# Patient Record
Sex: Female | Born: 1940
Health system: Southern US, Community
[De-identification: ages and names within clinical notes are randomized; demographics above are authoritative.]

## PROBLEM LIST (undated history)

## (undated) DIAGNOSIS — C801 Malignant (primary) neoplasm, unspecified: Secondary | ICD-10-CM

## (undated) DIAGNOSIS — Z923 Personal history of irradiation: Secondary | ICD-10-CM

## (undated) DIAGNOSIS — Z9889 Other specified postprocedural states: Secondary | ICD-10-CM

## (undated) DIAGNOSIS — I2510 Atherosclerotic heart disease of native coronary artery without angina pectoris: Secondary | ICD-10-CM

## (undated) DIAGNOSIS — R112 Nausea with vomiting, unspecified: Secondary | ICD-10-CM

## (undated) DIAGNOSIS — I499 Cardiac arrhythmia, unspecified: Secondary | ICD-10-CM

## (undated) DIAGNOSIS — M8580 Other specified disorders of bone density and structure, unspecified site: Secondary | ICD-10-CM

## (undated) DIAGNOSIS — R0602 Shortness of breath: Secondary | ICD-10-CM

## (undated) DIAGNOSIS — M1990 Unspecified osteoarthritis, unspecified site: Secondary | ICD-10-CM

## (undated) DIAGNOSIS — R32 Unspecified urinary incontinence: Secondary | ICD-10-CM

## (undated) DIAGNOSIS — K219 Gastro-esophageal reflux disease without esophagitis: Secondary | ICD-10-CM

## (undated) DIAGNOSIS — Z9221 Personal history of antineoplastic chemotherapy: Secondary | ICD-10-CM

## (undated) DIAGNOSIS — A64 Unspecified sexually transmitted disease: Secondary | ICD-10-CM

## (undated) DIAGNOSIS — I10 Essential (primary) hypertension: Secondary | ICD-10-CM

## (undated) DIAGNOSIS — I4891 Unspecified atrial fibrillation: Secondary | ICD-10-CM

## (undated) HISTORY — PX: TONSILLECTOMY: SUR1361

## (undated) HISTORY — DX: Unspecified urinary incontinence: R32

## (undated) HISTORY — DX: Other specified disorders of bone density and structure, unspecified site: M85.80

## (undated) HISTORY — PX: POLYPECTOMY: SHX149

## (undated) HISTORY — DX: Atherosclerotic heart disease of native coronary artery without angina pectoris: I25.10

## (undated) HISTORY — PX: COLONOSCOPY: SHX174

## (undated) HISTORY — DX: Unspecified atrial fibrillation: I48.91

## (undated) HISTORY — DX: Malignant (primary) neoplasm, unspecified: C80.1

## (undated) HISTORY — PX: BREAST EXCISIONAL BIOPSY: SUR124

## (undated) HISTORY — DX: Essential (primary) hypertension: I10

## (undated) HISTORY — DX: Unspecified sexually transmitted disease: A64

---

## 1996-04-11 DIAGNOSIS — Z9221 Personal history of antineoplastic chemotherapy: Secondary | ICD-10-CM

## 1996-04-11 DIAGNOSIS — Z923 Personal history of irradiation: Secondary | ICD-10-CM

## 1996-04-11 DIAGNOSIS — C801 Malignant (primary) neoplasm, unspecified: Secondary | ICD-10-CM

## 1996-04-11 HISTORY — DX: Personal history of antineoplastic chemotherapy: Z92.21

## 1996-04-11 HISTORY — DX: Malignant (primary) neoplasm, unspecified: C80.1

## 1996-04-11 HISTORY — DX: Personal history of irradiation: Z92.3

## 1996-04-11 HISTORY — PX: BREAST LUMPECTOMY: SHX2

## 1996-04-11 HISTORY — PX: BREAST SURGERY: SHX581

## 2001-04-11 HISTORY — PX: OTHER SURGICAL HISTORY: SHX169

## 2002-04-11 HISTORY — PX: CORONARY ARTERY BYPASS GRAFT: SHX141

## 2002-05-22 ENCOUNTER — Ambulatory Visit (HOSPITAL_COMMUNITY): Admission: RE | Admit: 2002-05-22 | Discharge: 2002-05-22 | Payer: Self-pay

## 2002-06-25 ENCOUNTER — Encounter (HOSPITAL_COMMUNITY): Admission: RE | Admit: 2002-06-25 | Discharge: 2002-09-23 | Payer: Self-pay | Admitting: Cardiology

## 2002-10-10 ENCOUNTER — Encounter (HOSPITAL_COMMUNITY): Admission: RE | Admit: 2002-10-10 | Discharge: 2003-01-08 | Payer: Self-pay | Admitting: Cardiology

## 2003-01-10 ENCOUNTER — Encounter (HOSPITAL_COMMUNITY): Admission: RE | Admit: 2003-01-10 | Discharge: 2003-04-10 | Payer: Self-pay | Admitting: Cardiology

## 2003-04-12 ENCOUNTER — Encounter (HOSPITAL_COMMUNITY): Admission: RE | Admit: 2003-04-12 | Discharge: 2003-07-11 | Payer: Self-pay | Admitting: Cardiology

## 2003-06-10 ENCOUNTER — Encounter: Admission: RE | Admit: 2003-06-10 | Discharge: 2003-06-10 | Payer: Self-pay | Admitting: General Surgery

## 2003-07-12 ENCOUNTER — Encounter (HOSPITAL_COMMUNITY): Admission: RE | Admit: 2003-07-12 | Discharge: 2003-09-10 | Payer: Self-pay | Admitting: Cardiology

## 2004-04-11 HISTORY — PX: DILATION AND CURETTAGE OF UTERUS: SHX78

## 2004-06-10 ENCOUNTER — Encounter: Admission: RE | Admit: 2004-06-10 | Discharge: 2004-06-10 | Payer: Self-pay | Admitting: Oncology

## 2004-06-28 ENCOUNTER — Other Ambulatory Visit: Admission: RE | Admit: 2004-06-28 | Discharge: 2004-06-28 | Payer: Self-pay | Admitting: Obstetrics and Gynecology

## 2004-06-29 ENCOUNTER — Ambulatory Visit: Payer: Self-pay | Admitting: Oncology

## 2004-07-29 ENCOUNTER — Encounter (INDEPENDENT_AMBULATORY_CARE_PROVIDER_SITE_OTHER): Payer: Self-pay | Admitting: *Deleted

## 2004-07-29 ENCOUNTER — Ambulatory Visit (HOSPITAL_COMMUNITY): Admission: RE | Admit: 2004-07-29 | Discharge: 2004-07-29 | Payer: Self-pay | Admitting: Obstetrics and Gynecology

## 2004-08-24 ENCOUNTER — Encounter: Admission: RE | Admit: 2004-08-24 | Discharge: 2004-08-24 | Payer: Self-pay | Admitting: Oncology

## 2004-10-25 ENCOUNTER — Other Ambulatory Visit: Admission: RE | Admit: 2004-10-25 | Discharge: 2004-10-25 | Payer: Self-pay | Admitting: Obstetrics and Gynecology

## 2005-04-21 ENCOUNTER — Other Ambulatory Visit: Admission: RE | Admit: 2005-04-21 | Discharge: 2005-04-21 | Payer: Self-pay | Admitting: Obstetrics and Gynecology

## 2005-04-25 ENCOUNTER — Ambulatory Visit: Payer: Self-pay | Admitting: Internal Medicine

## 2005-05-03 ENCOUNTER — Ambulatory Visit: Payer: Self-pay | Admitting: Internal Medicine

## 2005-05-09 ENCOUNTER — Ambulatory Visit: Payer: Self-pay | Admitting: Internal Medicine

## 2005-05-09 ENCOUNTER — Encounter (INDEPENDENT_AMBULATORY_CARE_PROVIDER_SITE_OTHER): Payer: Self-pay | Admitting: Specialist

## 2005-05-09 DIAGNOSIS — D126 Benign neoplasm of colon, unspecified: Secondary | ICD-10-CM | POA: Insufficient documentation

## 2005-05-19 ENCOUNTER — Ambulatory Visit (HOSPITAL_COMMUNITY): Admission: RE | Admit: 2005-05-19 | Discharge: 2005-05-19 | Payer: Self-pay | Admitting: Internal Medicine

## 2005-05-19 ENCOUNTER — Ambulatory Visit: Payer: Self-pay | Admitting: Internal Medicine

## 2005-05-19 DIAGNOSIS — K219 Gastro-esophageal reflux disease without esophagitis: Secondary | ICD-10-CM | POA: Insufficient documentation

## 2005-05-19 DIAGNOSIS — K2900 Acute gastritis without bleeding: Secondary | ICD-10-CM | POA: Insufficient documentation

## 2005-05-19 DIAGNOSIS — K222 Esophageal obstruction: Secondary | ICD-10-CM

## 2005-06-21 ENCOUNTER — Ambulatory Visit: Payer: Self-pay | Admitting: Oncology

## 2005-07-12 ENCOUNTER — Ambulatory Visit: Payer: Self-pay | Admitting: Internal Medicine

## 2005-07-12 ENCOUNTER — Encounter: Admission: RE | Admit: 2005-07-12 | Discharge: 2005-07-12 | Payer: Self-pay | Admitting: Oncology

## 2006-06-19 ENCOUNTER — Ambulatory Visit: Payer: Self-pay | Admitting: Oncology

## 2006-07-17 ENCOUNTER — Encounter: Admission: RE | Admit: 2006-07-17 | Discharge: 2006-07-17 | Payer: Self-pay | Admitting: Oncology

## 2006-07-18 ENCOUNTER — Encounter: Admission: RE | Admit: 2006-07-18 | Discharge: 2006-07-18 | Payer: Self-pay | Admitting: Oncology

## 2006-08-21 ENCOUNTER — Ambulatory Visit: Payer: Self-pay | Admitting: Oncology

## 2006-10-23 ENCOUNTER — Ambulatory Visit: Payer: Self-pay | Admitting: Internal Medicine

## 2007-07-18 ENCOUNTER — Encounter: Admission: RE | Admit: 2007-07-18 | Discharge: 2007-07-18 | Payer: Self-pay | Admitting: Oncology

## 2007-07-25 ENCOUNTER — Ambulatory Visit: Payer: Self-pay | Admitting: Oncology

## 2007-07-30 LAB — CBC WITH DIFFERENTIAL/PLATELET
BASO%: 0.2 % (ref 0.0–2.0)
Basophils Absolute: 0 10*3/uL (ref 0.0–0.1)
MCH: 32.2 pg (ref 26.0–34.0)
MCV: 91.6 fL (ref 81.0–101.0)
MONO#: 0.3 10*3/uL (ref 0.1–0.9)
MONO%: 4.9 % (ref 0.0–13.0)
NEUT%: 64.4 % (ref 39.6–76.8)
Platelets: 194 10*3/uL (ref 145–400)
RBC: 4.25 10*6/uL (ref 3.70–5.32)
RDW: 13.3 % (ref 11.3–14.5)
WBC: 5.2 10*3/uL (ref 3.9–10.0)
lymph#: 1.5 10*3/uL (ref 0.9–3.3)

## 2007-07-30 LAB — COMPREHENSIVE METABOLIC PANEL
ALT: 28 U/L (ref 0–35)
Albumin: 4 g/dL (ref 3.5–5.2)
Calcium: 9.3 mg/dL (ref 8.4–10.5)
Chloride: 104 mEq/L (ref 96–112)
Potassium: 3.7 mEq/L (ref 3.5–5.3)
Sodium: 140 mEq/L (ref 135–145)
Total Protein: 6.7 g/dL (ref 6.0–8.3)

## 2007-08-01 ENCOUNTER — Encounter: Payer: Self-pay | Admitting: Internal Medicine

## 2007-08-01 DIAGNOSIS — Z8719 Personal history of other diseases of the digestive system: Secondary | ICD-10-CM

## 2008-01-18 ENCOUNTER — Telehealth: Payer: Self-pay | Admitting: Internal Medicine

## 2008-03-25 ENCOUNTER — Ambulatory Visit: Payer: Self-pay | Admitting: Vascular Surgery

## 2008-07-21 ENCOUNTER — Ambulatory Visit: Payer: Self-pay | Admitting: Oncology

## 2008-07-22 ENCOUNTER — Encounter: Admission: RE | Admit: 2008-07-22 | Discharge: 2008-07-22 | Payer: Self-pay | Admitting: Oncology

## 2008-07-23 LAB — COMPREHENSIVE METABOLIC PANEL
AST: 21 U/L (ref 0–37)
Albumin: 4.1 g/dL (ref 3.5–5.2)
BUN: 24 mg/dL — ABNORMAL HIGH (ref 6–23)
CO2: 27 mEq/L (ref 19–32)
Creatinine, Ser: 1.11 mg/dL (ref 0.40–1.20)
Total Bilirubin: 0.5 mg/dL (ref 0.3–1.2)
Total Protein: 6.7 g/dL (ref 6.0–8.3)

## 2008-07-23 LAB — CBC WITH DIFFERENTIAL/PLATELET
Eosinophils Absolute: 0.1 10*3/uL (ref 0.0–0.5)
HGB: 14.1 g/dL (ref 11.6–15.9)
MCH: 32 pg (ref 25.1–34.0)
MCHC: 34.2 g/dL (ref 31.5–36.0)
MCV: 93.7 fL (ref 79.5–101.0)
MONO%: 10.6 % (ref 0.0–14.0)
Platelets: 194 10*3/uL (ref 145–400)
RBC: 4.42 10*6/uL (ref 3.70–5.45)
WBC: 4.9 10*3/uL (ref 3.9–10.3)

## 2008-07-30 ENCOUNTER — Encounter: Payer: Self-pay | Admitting: Internal Medicine

## 2008-08-22 ENCOUNTER — Ambulatory Visit (HOSPITAL_COMMUNITY): Admission: RE | Admit: 2008-08-22 | Discharge: 2008-08-22 | Payer: Self-pay | Admitting: Oncology

## 2008-12-04 ENCOUNTER — Encounter: Admission: RE | Admit: 2008-12-04 | Discharge: 2008-12-04 | Payer: Self-pay | Admitting: Orthopedic Surgery

## 2008-12-12 ENCOUNTER — Encounter: Payer: Self-pay | Admitting: Internal Medicine

## 2008-12-12 ENCOUNTER — Telehealth: Payer: Self-pay | Admitting: Internal Medicine

## 2008-12-31 ENCOUNTER — Encounter: Payer: Self-pay | Admitting: Internal Medicine

## 2009-01-07 ENCOUNTER — Ambulatory Visit: Payer: Self-pay | Admitting: Internal Medicine

## 2009-01-07 DIAGNOSIS — Z860100 Personal history of colon polyps, unspecified: Secondary | ICD-10-CM | POA: Insufficient documentation

## 2009-01-07 DIAGNOSIS — K625 Hemorrhage of anus and rectum: Secondary | ICD-10-CM

## 2009-01-08 ENCOUNTER — Telehealth: Payer: Self-pay | Admitting: Internal Medicine

## 2009-02-16 ENCOUNTER — Ambulatory Visit: Payer: Self-pay | Admitting: Internal Medicine

## 2009-07-24 ENCOUNTER — Encounter: Admission: RE | Admit: 2009-07-24 | Discharge: 2009-07-24 | Payer: Self-pay | Admitting: Oncology

## 2009-08-13 ENCOUNTER — Ambulatory Visit: Payer: Self-pay | Admitting: Oncology

## 2009-08-14 LAB — CBC WITH DIFFERENTIAL/PLATELET
BASO%: 0.4 % (ref 0.0–2.0)
Basophils Absolute: 0 10*3/uL (ref 0.0–0.1)
LYMPH%: 34.8 % (ref 14.0–49.7)
MONO#: 0.5 10*3/uL (ref 0.1–0.9)
MONO%: 10 % (ref 0.0–14.0)
NEUT#: 2.6 10*3/uL (ref 1.5–6.5)
NEUT%: 52.5 % (ref 38.4–76.8)
RBC: 4.22 10*6/uL (ref 3.70–5.45)
RDW: 13.5 % (ref 11.2–14.5)
WBC: 4.9 10*3/uL (ref 3.9–10.3)
lymph#: 1.7 10*3/uL (ref 0.9–3.3)

## 2009-08-14 LAB — COMPREHENSIVE METABOLIC PANEL
ALT: 27 U/L (ref 0–35)
CO2: 26 mEq/L (ref 19–32)
Calcium: 9.1 mg/dL (ref 8.4–10.5)
Chloride: 105 mEq/L (ref 96–112)
Creatinine, Ser: 1.29 mg/dL — ABNORMAL HIGH (ref 0.40–1.20)
Glucose, Bld: 102 mg/dL — ABNORMAL HIGH (ref 70–99)
Sodium: 140 mEq/L (ref 135–145)
Total Bilirubin: 0.6 mg/dL (ref 0.3–1.2)

## 2010-05-01 ENCOUNTER — Other Ambulatory Visit: Payer: Self-pay | Admitting: Oncology

## 2010-05-01 DIAGNOSIS — Z1231 Encounter for screening mammogram for malignant neoplasm of breast: Secondary | ICD-10-CM

## 2010-07-26 ENCOUNTER — Ambulatory Visit
Admission: RE | Admit: 2010-07-26 | Discharge: 2010-07-26 | Disposition: A | Discharge status: Home / Self Care | Payer: Medicare Other | Source: Ambulatory Visit | Attending: Oncology | Admitting: Oncology

## 2010-07-26 DIAGNOSIS — Z1231 Encounter for screening mammogram for malignant neoplasm of breast: Secondary | ICD-10-CM

## 2010-08-10 ENCOUNTER — Encounter (HOSPITAL_BASED_OUTPATIENT_CLINIC_OR_DEPARTMENT_OTHER): Payer: Medicare Other | Admitting: Oncology

## 2010-08-10 ENCOUNTER — Other Ambulatory Visit: Payer: Self-pay | Admitting: Oncology

## 2010-08-10 DIAGNOSIS — Z170 Estrogen receptor positive status [ER+]: Secondary | ICD-10-CM

## 2010-08-10 DIAGNOSIS — C50419 Malignant neoplasm of upper-outer quadrant of unspecified female breast: Secondary | ICD-10-CM

## 2010-08-10 DIAGNOSIS — C50919 Malignant neoplasm of unspecified site of unspecified female breast: Secondary | ICD-10-CM

## 2010-08-10 LAB — CBC WITH DIFFERENTIAL/PLATELET
EOS%: 2.5 % (ref 0.0–7.0)
Eosinophils Absolute: 0.1 10*3/uL (ref 0.0–0.5)
LYMPH%: 34.7 % (ref 14.0–49.7)
MCH: 32.2 pg (ref 25.1–34.0)
MCV: 94.8 fL (ref 79.5–101.0)
NEUT#: 3 10*3/uL (ref 1.5–6.5)
Platelets: 169 10*3/uL (ref 145–400)
RBC: 4.03 10*6/uL (ref 3.70–5.45)
WBC: 5.5 10*3/uL (ref 3.9–10.3)
lymph#: 1.9 10*3/uL (ref 0.9–3.3)

## 2010-08-10 LAB — COMPREHENSIVE METABOLIC PANEL
ALT: 19 U/L (ref 0–35)
Albumin: 4.2 g/dL (ref 3.5–5.2)
Alkaline Phosphatase: 60 U/L (ref 39–117)
BUN: 26 mg/dL — ABNORMAL HIGH (ref 6–23)
Chloride: 104 mEq/L (ref 96–112)
Creatinine, Ser: 1.19 mg/dL (ref 0.40–1.20)
Glucose, Bld: 85 mg/dL (ref 70–99)
Sodium: 140 mEq/L (ref 135–145)
Total Bilirubin: 0.8 mg/dL (ref 0.3–1.2)

## 2010-08-20 ENCOUNTER — Other Ambulatory Visit: Payer: Self-pay | Admitting: Oncology

## 2010-08-20 ENCOUNTER — Encounter (HOSPITAL_BASED_OUTPATIENT_CLINIC_OR_DEPARTMENT_OTHER): Payer: Medicare Other | Admitting: Oncology

## 2010-08-20 DIAGNOSIS — Z1231 Encounter for screening mammogram for malignant neoplasm of breast: Secondary | ICD-10-CM

## 2010-08-20 DIAGNOSIS — C50419 Malignant neoplasm of upper-outer quadrant of unspecified female breast: Secondary | ICD-10-CM

## 2010-08-24 NOTE — Assessment & Plan Note (Signed)
[facility]                         GASTROENTEROLOGY OFFICE NOTE   NAME:Mallory Watts, Mallory Watts                     MRN:          [id number]  DATE:10/23/2006                            DOB:          October 15, 1940    HISTORY:  The patient presents today for followup. She is a 70 year old  with a history of gastroesophageal reflux disease complicated by erosive  esophagitis, as well as a symptomatic peptic stricture for which she  underwent upper endoscopy with a esophageal dilation in February of  2007. For her reflux disease she has been maintained on Nexium 40 mg  daily. On Nexium she is having no difficulties with indigestion or  heartburn. She has had no recurrent dysphagia. She denies any medication  side effects. She requests a medication refill. The patient also  underwent complete colonoscopy in January of 2007. She was found to have  a diminutive colon polyp which was removed and found to be a tubular  adenoma. Follow up in 5 years recommend. She denies any lower GI  complaints. Her overall health has been well since her visit last year.   CURRENT MEDICATIONS:  Include;  1. Lipitor 40 mg daily.  2. Potassium chloride 20 mEq daily.  3. Caltrate.  4. Nortriptyline unspecified dosage at night.  5. Toprol XL 25 mg daily.  6. Valtrex 500 mg daily.  7. Nexium 40 mg daily.  8. Aspirin 81 mg daily.  9. Triamterene/hydrochlorothiazide 37.5/25 mg daily.  10.Ambien p.r.n.  11.Tylenol p.r.n.  12.Sublingual nitroglycerine p.r.n.  13.Nasonex p.r.n.   PHYSICAL EXAMINATION:  Finds a well-appearing female in no acute  distress. Her blood pressure is 118/68, heart rate is 90, weight is  154.6 pounds.  HEENT: Sclera are anicteric.  LUNGS: Clear.  HEART: Regular.  LUNGS: Clear.  ABDOMEN: Soft without tenderness, masses, or hernia.   IMPRESSION:  1. Gastroesophageal reflux disease with a history of erosive      esophagitis and peptic stricture. Currently  asymptomatic post      dilation on Nexium.  2. History of adenomatous colon polyp. Due for follow up in January      2012.   RECOMMENDATIONS:  1. Continue Nexium 40 mg daily. A prescription with multiple refills      has been provided.  2. Continue reflux precautions.  3. Follow up colonoscopy in 4 years as previously recommended.  4. Ongoing general medical care with Dr. Timothy Lasso.     Wilhemina Bonito. Marina Goodell, MD  Electronically Signed    JNP/MedQ  DD: 10/23/2006  DT: 10/23/2006  Job #: [id number]   cc:   Gwen Pounds, MD

## 2010-08-24 NOTE — Procedures (Signed)
CAROTID DUPLEX EXAM   INDICATION:  Patient states, The ophthalmologist saw something in my  right eye.   HISTORY:  Diabetes:  No.  Cardiac:  CABG in 2003.  Hypertension:  Yes.  Smoking:  Previous.  Previous Surgery:  No.  CV History:  No visual or cerebrovascular symptoms per patient.  Amaurosis Fugax No, Paresthesias No, Hemiparesis No.                                       RIGHT             LEFT  Brachial systolic pressure:         146               144  Brachial Doppler waveforms:         Normal            Normal  Vertebral direction of flow:        Antegrade         Antegrade  DUPLEX VELOCITIES (cm/sec)  CCA peak systolic                   62                71  ECA peak systolic                   60                69  ICA peak systolic                   36                43  ICA end diastolic                   14                13  PLAQUE MORPHOLOGY:  PLAQUE AMOUNT:                      None              None  PLAQUE LOCATION:   IMPRESSION:  1. No evidence of stenosis noted in the bilateral internal carotid      arteries.  2. Preliminary report was faxed to Dr. Marnee Spring office on 03/25/2008.       ___________________________________________  P. Liliane Bade, M.D.   CH/MEDQ  D:  03/25/2008  T:  03/25/2008  Job:  [id number]

## 2010-08-27 NOTE — Op Note (Signed)
NAMECRISTALLE, Mallory Watts              ACCOUNT NO.:  [id number]   MEDICAL RECORD NO.:  [id number]          PATIENT TYPE:  AMB   LOCATION:  SDC                           FACILITY:  [facility]   PHYSICIAN:  Randye Lobo, M.D.   DATE OF BIRTH:  13-Jul-1940   DATE OF PROCEDURE:  07/29/2004  DATE OF DISCHARGE:                                 OPERATIVE REPORT   PREOPERATIVE DIAGNOSIS:  1.  Atypical endometrial cells on Pap smear.  2.  History of left breast carcinoma, status post tamoxifen therapy.  3.  Chronic vulvar burning.   POSTOPERATIVE DIAGNOSIS:  1.  Atypical endometrial cells on Pap smear.  2.  History of left breast carcinoma, status post tamoxifen treatment.  3.  Chronic vulvar burning.  4.  Left labia majora ulceration.   PROCEDURE:  Diagnostic hysteroscopy, fractional dilation and curettage,  vulvar biopsy.   SURGEON:  Conley Simmonds, MD   ANESTHESIA:  General endotracheal, paracervical block with 1% lidocaine.   IV FLUIDS:  400 mL Ringer's lactate.   ESTIMATED BLOOD LOSS:  Minimal.   URINE OUTPUT:  5 mL, 3%.   SORBITOL DEFICIT:  30 mL.   COMPLICATIONS:  None.   INDICATIONS FOR PROCEDURE:  The patient is a 70 year old, gravida 85  Caucasian female with a history of left breast carcinoma T2, N1, status post  lumpectomy, Adriamycin and Cytoxan chemotherapy plus local radiation, status  post tamoxifen use for five years, and status post Arimidex for 11 months of  therapy who presented to the office for a routine Pap smear and was noted to  have a glandular cell abnormality consistent with atypical endometrial  cells. Endometrial biopsy in the office showed scanty endometrial tissue and  benign endocervical cells. The sampling was noted to be limited by the  pathologist.  A pelvic ultrasound showed endometrial stripe of 0.37 cm, two  intramural fibroids measuring 3.05 cm and 1.89 cm, and normal ovaries.   The patient also has reported a chronic history of chronic vaginitis  for  which she has been treated with antiyeast therapies, antibiotics, and recent  estrogen tablets and then vaginal estrogen cream which were discontinued  after the diagnosis of an abnormal Pap smear.   The plan is made to proceed with a diagnostic hysteroscopy with dilation and  curettage, and vulvar biopsies after risks, benefits, and alternatives were  discussed with the patient.   FINDINGS:  Exam under anesthesia revealed a small uterus with no adnexal  masses. Hysteroscopy demonstrated a tubular shaped to the lower uterine  segment which ultimately ended in a T-shape at the uterine fundus. There was  no evidence of any intracavitary polyps nor fibroids. The endometrium was  noted to be thin. The tubal ostia regions were visualized well and were both  noted to be unremarkable.   SPECIMENS:  The endocervical and endometrial curettings were sent  separately. The two vulvar biopsies were sent together.   DESCRIPTION OF PROCEDURE:  The patient was reidentified in the preoperative  hold area. She was brought to the operating room where general endotracheal  anesthesia was induced. She was  placed in the dorsal lithotomy position. The  vagina and vulva were then sterilely prepped and the bladder was  catheterized of urine. She was then sterilely draped. An examination under  anesthesia was performed. The findings are as noted above.   A speculum was placed inside the vagina and a single-tooth tenaculum was  placed on the anterior cervical lip. A paracervical block was performed with  a total of 9 mL of 1% lidocaine in standard fashion. The uterus was sounded  to 8 centimeters. The cervix was then sequentially dilated to a #19 Pratt  dilator. The diagnostic hysteroscope was then inserted through the cervix to  the level of the uterine fundus under the continuous infusion of sorbitol.  The findings were are as noted above.   The hysteroscope was withdrawn at this point and endocervical  curettage was  performed with Kevorkian curette. The endometrial cavity was then curetted  with the small serrated curette in all four quadrants such that the  endometrium had a characteristically gritty texture to it. These curettings  were then sent to pathology separately. The instruments were then removed  from the vagina and attention was turned to the left labia majora. There was  a shallow ulcerated area on the margin between the left labia majora and  minora and a punch biopsy was performed in this region. Just slightly  inferiorly to this on the labia majora, an additional second biopsy was  taken in a region where the patient had previously indicated pain. Single  interrupted sutures of 3-0 chromic were placed in the area of the biopsies  to create hemostasis.   This concluded the patient's surgery. She was cleansed of any remaining  Betadine, awakened, and taken out of the dorsal lithotomy position. She went  to the recovery room in stable and awake condition. There were no  complications to the procedure. All needle, instrument, and sponge counts  were correct.      BES/MEDQ  D:  07/29/2004  T:  07/29/2004  Job:  [id number]

## 2011-02-18 ENCOUNTER — Other Ambulatory Visit: Payer: Self-pay | Admitting: Obstetrics and Gynecology

## 2011-03-01 ENCOUNTER — Other Ambulatory Visit: Payer: Self-pay | Admitting: Internal Medicine

## 2011-04-15 DIAGNOSIS — R3915 Urgency of urination: Secondary | ICD-10-CM | POA: Diagnosis not present

## 2011-04-15 DIAGNOSIS — R350 Frequency of micturition: Secondary | ICD-10-CM | POA: Diagnosis not present

## 2011-05-11 ENCOUNTER — Telehealth: Payer: Self-pay

## 2011-05-11 NOTE — Telephone Encounter (Signed)
SPOKE WITH Mallory Watts AND TOLD HER THAT DR. Darrold Span SAID TO CALL HER PCP TO BE SEEN TO EVALUATE THE RIGHT BREAST. PT. VERBALIZED UNDERSTANDING.

## 2011-05-11 NOTE — Telephone Encounter (Signed)
MS. Plair CALLED STATING THAT SHE HAS BEEN EXPERIENCING SOME STINGING-PINS AND NEEDLES AND A GRABBING SENSATION FROM THE STERNUM TO THE NIPPLE OF THE RIGHT BREAST. AREA IS UPPER INNER QUADRANT OF RIGHT  BREAST ONSET 1 WEEK.  THE SENSATIONS WERE MORE CONSTANT THIS PAST WEEKEND.  THEY ARE MORE INTERMITTENT AGAIN. THE SENSATIONS ARE MORE SUPERFICIAL THEN IT WOULD BE FROM OPEN HEART SURGERY IN 2003. NO LUMPS NOTED. NO SWELLING IN BREAST OR AXILLA. NO DIMPLING OR DRAINAGE FROM NIPPLE. SHE HAS NOT TRIED ANY OTC PAIN MEDS.   NO HEAVY LIFTING DONE IN RECENT PAST. BEGAN VESICARE  5MG  DAILY IN December. MAMMOGRAM IS DUE 07-27-11. WHAT DOES DR. Darrold Span RECOMMEND?

## 2011-05-25 DIAGNOSIS — J309 Allergic rhinitis, unspecified: Secondary | ICD-10-CM | POA: Diagnosis not present

## 2011-05-25 DIAGNOSIS — J069 Acute upper respiratory infection, unspecified: Secondary | ICD-10-CM | POA: Diagnosis not present

## 2011-05-25 DIAGNOSIS — I2510 Atherosclerotic heart disease of native coronary artery without angina pectoris: Secondary | ICD-10-CM | POA: Diagnosis not present

## 2011-07-18 DIAGNOSIS — E785 Hyperlipidemia, unspecified: Secondary | ICD-10-CM | POA: Diagnosis not present

## 2011-07-18 DIAGNOSIS — I4892 Unspecified atrial flutter: Secondary | ICD-10-CM | POA: Diagnosis not present

## 2011-07-18 DIAGNOSIS — I119 Hypertensive heart disease without heart failure: Secondary | ICD-10-CM | POA: Diagnosis not present

## 2011-07-18 DIAGNOSIS — I2510 Atherosclerotic heart disease of native coronary artery without angina pectoris: Secondary | ICD-10-CM | POA: Diagnosis not present

## 2011-07-18 DIAGNOSIS — Z951 Presence of aortocoronary bypass graft: Secondary | ICD-10-CM | POA: Diagnosis not present

## 2011-07-26 ENCOUNTER — Other Ambulatory Visit: Payer: Self-pay | Admitting: Cardiology

## 2011-07-27 ENCOUNTER — Ambulatory Visit
Admission: RE | Admit: 2011-07-27 | Discharge: 2011-07-27 | Disposition: A | Discharge status: Home / Self Care | Payer: Medicare Other | Source: Ambulatory Visit | Attending: Oncology | Admitting: Oncology

## 2011-07-27 DIAGNOSIS — Z1231 Encounter for screening mammogram for malignant neoplasm of breast: Secondary | ICD-10-CM

## 2011-08-26 DIAGNOSIS — E785 Hyperlipidemia, unspecified: Secondary | ICD-10-CM | POA: Diagnosis not present

## 2011-08-26 DIAGNOSIS — I10 Essential (primary) hypertension: Secondary | ICD-10-CM | POA: Diagnosis not present

## 2011-08-26 DIAGNOSIS — K219 Gastro-esophageal reflux disease without esophagitis: Secondary | ICD-10-CM | POA: Diagnosis not present

## 2011-08-26 DIAGNOSIS — I2510 Atherosclerotic heart disease of native coronary artery without angina pectoris: Secondary | ICD-10-CM | POA: Diagnosis not present

## 2011-09-14 DIAGNOSIS — H2510 Age-related nuclear cataract, unspecified eye: Secondary | ICD-10-CM | POA: Diagnosis not present

## 2011-10-11 DIAGNOSIS — H2510 Age-related nuclear cataract, unspecified eye: Secondary | ICD-10-CM | POA: Diagnosis not present

## 2011-10-11 DIAGNOSIS — H269 Unspecified cataract: Secondary | ICD-10-CM | POA: Diagnosis not present

## 2011-10-21 DIAGNOSIS — R3915 Urgency of urination: Secondary | ICD-10-CM | POA: Diagnosis not present

## 2011-10-21 DIAGNOSIS — R350 Frequency of micturition: Secondary | ICD-10-CM | POA: Diagnosis not present

## 2011-11-11 DIAGNOSIS — H2510 Age-related nuclear cataract, unspecified eye: Secondary | ICD-10-CM | POA: Diagnosis not present

## 2011-11-20 ENCOUNTER — Other Ambulatory Visit: Payer: Self-pay | Admitting: Cardiology

## 2011-11-29 DIAGNOSIS — H269 Unspecified cataract: Secondary | ICD-10-CM | POA: Diagnosis not present

## 2011-11-29 DIAGNOSIS — H2510 Age-related nuclear cataract, unspecified eye: Secondary | ICD-10-CM | POA: Diagnosis not present

## 2012-01-20 DIAGNOSIS — Z23 Encounter for immunization: Secondary | ICD-10-CM | POA: Diagnosis not present

## 2012-01-30 DIAGNOSIS — Z951 Presence of aortocoronary bypass graft: Secondary | ICD-10-CM | POA: Diagnosis not present

## 2012-01-30 DIAGNOSIS — I119 Hypertensive heart disease without heart failure: Secondary | ICD-10-CM | POA: Diagnosis not present

## 2012-01-30 DIAGNOSIS — I2510 Atherosclerotic heart disease of native coronary artery without angina pectoris: Secondary | ICD-10-CM | POA: Diagnosis not present

## 2012-01-30 DIAGNOSIS — I4892 Unspecified atrial flutter: Secondary | ICD-10-CM | POA: Diagnosis not present

## 2012-01-30 DIAGNOSIS — E785 Hyperlipidemia, unspecified: Secondary | ICD-10-CM | POA: Diagnosis not present

## 2012-02-21 DIAGNOSIS — N644 Mastodynia: Secondary | ICD-10-CM | POA: Diagnosis not present

## 2012-02-21 DIAGNOSIS — E785 Hyperlipidemia, unspecified: Secondary | ICD-10-CM | POA: Diagnosis not present

## 2012-02-21 DIAGNOSIS — I2510 Atherosclerotic heart disease of native coronary artery without angina pectoris: Secondary | ICD-10-CM | POA: Diagnosis not present

## 2012-02-21 DIAGNOSIS — Z853 Personal history of malignant neoplasm of breast: Secondary | ICD-10-CM | POA: Diagnosis not present

## 2012-02-21 DIAGNOSIS — I10 Essential (primary) hypertension: Secondary | ICD-10-CM | POA: Diagnosis not present

## 2012-02-21 DIAGNOSIS — M899 Disorder of bone, unspecified: Secondary | ICD-10-CM | POA: Diagnosis not present

## 2012-02-21 DIAGNOSIS — M949 Disorder of cartilage, unspecified: Secondary | ICD-10-CM | POA: Diagnosis not present

## 2012-02-28 DIAGNOSIS — Z1331 Encounter for screening for depression: Secondary | ICD-10-CM | POA: Diagnosis not present

## 2012-02-28 DIAGNOSIS — Z0000 Encounter for general adult medical examination without abnormal findings: Secondary | ICD-10-CM | POA: Diagnosis not present

## 2012-02-28 DIAGNOSIS — I2510 Atherosclerotic heart disease of native coronary artery without angina pectoris: Secondary | ICD-10-CM | POA: Diagnosis not present

## 2012-02-28 DIAGNOSIS — Z23 Encounter for immunization: Secondary | ICD-10-CM | POA: Diagnosis not present

## 2012-02-28 DIAGNOSIS — G4700 Insomnia, unspecified: Secondary | ICD-10-CM | POA: Diagnosis not present

## 2012-03-02 DIAGNOSIS — Z1212 Encounter for screening for malignant neoplasm of rectum: Secondary | ICD-10-CM | POA: Diagnosis not present

## 2012-03-20 DIAGNOSIS — L909 Atrophic disorder of skin, unspecified: Secondary | ICD-10-CM | POA: Diagnosis not present

## 2012-03-20 DIAGNOSIS — D2370 Other benign neoplasm of skin of unspecified lower limb, including hip: Secondary | ICD-10-CM | POA: Diagnosis not present

## 2012-03-20 DIAGNOSIS — D235 Other benign neoplasm of skin of trunk: Secondary | ICD-10-CM | POA: Diagnosis not present

## 2012-03-20 DIAGNOSIS — I10 Essential (primary) hypertension: Secondary | ICD-10-CM | POA: Diagnosis not present

## 2012-03-20 DIAGNOSIS — D1801 Hemangioma of skin and subcutaneous tissue: Secondary | ICD-10-CM | POA: Diagnosis not present

## 2012-03-20 DIAGNOSIS — L84 Corns and callosities: Secondary | ICD-10-CM | POA: Diagnosis not present

## 2012-03-20 DIAGNOSIS — L821 Other seborrheic keratosis: Secondary | ICD-10-CM | POA: Diagnosis not present

## 2012-03-20 DIAGNOSIS — D239 Other benign neoplasm of skin, unspecified: Secondary | ICD-10-CM | POA: Diagnosis not present

## 2012-06-28 ENCOUNTER — Other Ambulatory Visit: Payer: Self-pay

## 2012-06-28 DIAGNOSIS — Z1231 Encounter for screening mammogram for malignant neoplasm of breast: Secondary | ICD-10-CM

## 2012-07-05 DIAGNOSIS — R350 Frequency of micturition: Secondary | ICD-10-CM | POA: Diagnosis not present

## 2012-07-05 DIAGNOSIS — R351 Nocturia: Secondary | ICD-10-CM | POA: Diagnosis not present

## 2012-08-01 ENCOUNTER — Ambulatory Visit
Admission: RE | Admit: 2012-08-01 | Discharge: 2012-08-01 | Disposition: A | Discharge status: Home / Self Care | Payer: Medicare Other | Source: Ambulatory Visit

## 2012-08-01 DIAGNOSIS — Z1231 Encounter for screening mammogram for malignant neoplasm of breast: Secondary | ICD-10-CM

## 2012-08-07 DIAGNOSIS — Z951 Presence of aortocoronary bypass graft: Secondary | ICD-10-CM | POA: Diagnosis not present

## 2012-08-07 DIAGNOSIS — I119 Hypertensive heart disease without heart failure: Secondary | ICD-10-CM | POA: Diagnosis not present

## 2012-08-07 DIAGNOSIS — I2510 Atherosclerotic heart disease of native coronary artery without angina pectoris: Secondary | ICD-10-CM | POA: Diagnosis not present

## 2012-08-07 DIAGNOSIS — I4892 Unspecified atrial flutter: Secondary | ICD-10-CM | POA: Diagnosis not present

## 2012-08-07 DIAGNOSIS — E785 Hyperlipidemia, unspecified: Secondary | ICD-10-CM | POA: Diagnosis not present

## 2012-08-28 DIAGNOSIS — Z780 Asymptomatic menopausal state: Secondary | ICD-10-CM | POA: Diagnosis not present

## 2012-08-28 DIAGNOSIS — I2510 Atherosclerotic heart disease of native coronary artery without angina pectoris: Secondary | ICD-10-CM | POA: Diagnosis not present

## 2012-08-28 DIAGNOSIS — Z6830 Body mass index (BMI) 30.0-30.9, adult: Secondary | ICD-10-CM | POA: Diagnosis not present

## 2012-08-28 DIAGNOSIS — M899 Disorder of bone, unspecified: Secondary | ICD-10-CM | POA: Diagnosis not present

## 2012-08-28 DIAGNOSIS — E785 Hyperlipidemia, unspecified: Secondary | ICD-10-CM | POA: Diagnosis not present

## 2012-08-28 DIAGNOSIS — R609 Edema, unspecified: Secondary | ICD-10-CM | POA: Diagnosis not present

## 2012-08-28 DIAGNOSIS — M949 Disorder of cartilage, unspecified: Secondary | ICD-10-CM | POA: Diagnosis not present

## 2012-08-28 DIAGNOSIS — I10 Essential (primary) hypertension: Secondary | ICD-10-CM | POA: Diagnosis not present

## 2012-08-28 DIAGNOSIS — J309 Allergic rhinitis, unspecified: Secondary | ICD-10-CM | POA: Diagnosis not present

## 2012-08-28 DIAGNOSIS — I4892 Unspecified atrial flutter: Secondary | ICD-10-CM | POA: Diagnosis not present

## 2012-10-03 DIAGNOSIS — M4310 Spondylolisthesis, site unspecified: Secondary | ICD-10-CM | POA: Diagnosis not present

## 2012-10-03 DIAGNOSIS — M48061 Spinal stenosis, lumbar region without neurogenic claudication: Secondary | ICD-10-CM | POA: Diagnosis not present

## 2012-10-23 DIAGNOSIS — M48061 Spinal stenosis, lumbar region without neurogenic claudication: Secondary | ICD-10-CM | POA: Diagnosis not present

## 2012-11-24 DIAGNOSIS — R21 Rash and other nonspecific skin eruption: Secondary | ICD-10-CM | POA: Diagnosis not present

## 2012-12-03 DIAGNOSIS — L255 Unspecified contact dermatitis due to plants, except food: Secondary | ICD-10-CM | POA: Diagnosis not present

## 2012-12-03 DIAGNOSIS — Z6830 Body mass index (BMI) 30.0-30.9, adult: Secondary | ICD-10-CM | POA: Diagnosis not present

## 2012-12-03 DIAGNOSIS — R609 Edema, unspecified: Secondary | ICD-10-CM | POA: Diagnosis not present

## 2012-12-03 DIAGNOSIS — I10 Essential (primary) hypertension: Secondary | ICD-10-CM | POA: Diagnosis not present

## 2012-12-14 DIAGNOSIS — H524 Presbyopia: Secondary | ICD-10-CM | POA: Diagnosis not present

## 2012-12-14 DIAGNOSIS — H43819 Vitreous degeneration, unspecified eye: Secondary | ICD-10-CM | POA: Diagnosis not present

## 2012-12-14 DIAGNOSIS — H26499 Other secondary cataract, unspecified eye: Secondary | ICD-10-CM | POA: Diagnosis not present

## 2012-12-14 DIAGNOSIS — Z961 Presence of intraocular lens: Secondary | ICD-10-CM | POA: Diagnosis not present

## 2012-12-14 DIAGNOSIS — H02839 Dermatochalasis of unspecified eye, unspecified eyelid: Secondary | ICD-10-CM | POA: Diagnosis not present

## 2013-01-14 DIAGNOSIS — Z6830 Body mass index (BMI) 30.0-30.9, adult: Secondary | ICD-10-CM | POA: Diagnosis not present

## 2013-01-14 DIAGNOSIS — J309 Allergic rhinitis, unspecified: Secondary | ICD-10-CM | POA: Diagnosis not present

## 2013-01-14 DIAGNOSIS — J069 Acute upper respiratory infection, unspecified: Secondary | ICD-10-CM | POA: Diagnosis not present

## 2013-01-14 DIAGNOSIS — R059 Cough, unspecified: Secondary | ICD-10-CM | POA: Diagnosis not present

## 2013-01-14 DIAGNOSIS — I10 Essential (primary) hypertension: Secondary | ICD-10-CM | POA: Diagnosis not present

## 2013-01-14 DIAGNOSIS — R05 Cough: Secondary | ICD-10-CM | POA: Diagnosis not present

## 2013-02-11 DIAGNOSIS — M5412 Radiculopathy, cervical region: Secondary | ICD-10-CM | POA: Diagnosis not present

## 2013-02-11 DIAGNOSIS — E785 Hyperlipidemia, unspecified: Secondary | ICD-10-CM | POA: Diagnosis not present

## 2013-02-11 DIAGNOSIS — I4892 Unspecified atrial flutter: Secondary | ICD-10-CM | POA: Diagnosis not present

## 2013-02-11 DIAGNOSIS — I2510 Atherosclerotic heart disease of native coronary artery without angina pectoris: Secondary | ICD-10-CM | POA: Diagnosis not present

## 2013-02-11 DIAGNOSIS — I119 Hypertensive heart disease without heart failure: Secondary | ICD-10-CM | POA: Diagnosis not present

## 2013-02-11 DIAGNOSIS — Z951 Presence of aortocoronary bypass graft: Secondary | ICD-10-CM | POA: Diagnosis not present

## 2013-02-11 DIAGNOSIS — M25819 Other specified joint disorders, unspecified shoulder: Secondary | ICD-10-CM | POA: Diagnosis not present

## 2013-02-25 DIAGNOSIS — M25819 Other specified joint disorders, unspecified shoulder: Secondary | ICD-10-CM | POA: Diagnosis not present

## 2013-02-25 DIAGNOSIS — M5412 Radiculopathy, cervical region: Secondary | ICD-10-CM | POA: Diagnosis not present

## 2013-03-01 DIAGNOSIS — I10 Essential (primary) hypertension: Secondary | ICD-10-CM | POA: Diagnosis not present

## 2013-03-01 DIAGNOSIS — E785 Hyperlipidemia, unspecified: Secondary | ICD-10-CM | POA: Diagnosis not present

## 2013-03-01 DIAGNOSIS — I2510 Atherosclerotic heart disease of native coronary artery without angina pectoris: Secondary | ICD-10-CM | POA: Diagnosis not present

## 2013-03-01 DIAGNOSIS — Z23 Encounter for immunization: Secondary | ICD-10-CM | POA: Diagnosis not present

## 2013-03-01 DIAGNOSIS — M899 Disorder of bone, unspecified: Secondary | ICD-10-CM | POA: Diagnosis not present

## 2013-03-14 DIAGNOSIS — Z853 Personal history of malignant neoplasm of breast: Secondary | ICD-10-CM | POA: Diagnosis not present

## 2013-03-14 DIAGNOSIS — Z1331 Encounter for screening for depression: Secondary | ICD-10-CM | POA: Diagnosis not present

## 2013-03-14 DIAGNOSIS — K644 Residual hemorrhoidal skin tags: Secondary | ICD-10-CM | POA: Diagnosis not present

## 2013-03-14 DIAGNOSIS — I2510 Atherosclerotic heart disease of native coronary artery without angina pectoris: Secondary | ICD-10-CM | POA: Diagnosis not present

## 2013-03-14 DIAGNOSIS — K219 Gastro-esophageal reflux disease without esophagitis: Secondary | ICD-10-CM | POA: Diagnosis not present

## 2013-03-14 DIAGNOSIS — E785 Hyperlipidemia, unspecified: Secondary | ICD-10-CM | POA: Diagnosis not present

## 2013-03-14 DIAGNOSIS — I4892 Unspecified atrial flutter: Secondary | ICD-10-CM | POA: Diagnosis not present

## 2013-03-14 DIAGNOSIS — Z0000 Encounter for general adult medical examination without abnormal findings: Secondary | ICD-10-CM | POA: Diagnosis not present

## 2013-03-14 DIAGNOSIS — I10 Essential (primary) hypertension: Secondary | ICD-10-CM | POA: Diagnosis not present

## 2013-03-15 DIAGNOSIS — D239 Other benign neoplasm of skin, unspecified: Secondary | ICD-10-CM | POA: Diagnosis not present

## 2013-03-15 DIAGNOSIS — L819 Disorder of pigmentation, unspecified: Secondary | ICD-10-CM | POA: Diagnosis not present

## 2013-03-15 DIAGNOSIS — L821 Other seborrheic keratosis: Secondary | ICD-10-CM | POA: Diagnosis not present

## 2013-03-15 DIAGNOSIS — D2370 Other benign neoplasm of skin of unspecified lower limb, including hip: Secondary | ICD-10-CM | POA: Diagnosis not present

## 2013-03-15 DIAGNOSIS — D2360 Other benign neoplasm of skin of unspecified upper limb, including shoulder: Secondary | ICD-10-CM | POA: Diagnosis not present

## 2013-03-15 DIAGNOSIS — L723 Sebaceous cyst: Secondary | ICD-10-CM | POA: Diagnosis not present

## 2013-03-19 DIAGNOSIS — Z1212 Encounter for screening for malignant neoplasm of rectum: Secondary | ICD-10-CM | POA: Diagnosis not present

## 2013-05-08 ENCOUNTER — Telehealth: Payer: Self-pay | Admitting: Obstetrics and Gynecology

## 2013-05-08 NOTE — Telephone Encounter (Signed)
Pt is a old pt of Dr. Quincy Simmonds and she has medicare.She states she last saw Dr. Quincy Simmonds in 2012 and would love to schedule as a new pt with her. Pleas call patient @ [phone]

## 2013-05-10 NOTE — Telephone Encounter (Signed)
Left message for patient to call me back regarding setting up an appointment with Dr. Quincy Simmonds.

## 2013-05-22 DIAGNOSIS — M25819 Other specified joint disorders, unspecified shoulder: Secondary | ICD-10-CM | POA: Diagnosis not present

## 2013-05-22 DIAGNOSIS — M5412 Radiculopathy, cervical region: Secondary | ICD-10-CM | POA: Diagnosis not present

## 2013-05-22 DIAGNOSIS — S4350XA Sprain of unspecified acromioclavicular joint, initial encounter: Secondary | ICD-10-CM | POA: Diagnosis not present

## 2013-05-27 DIAGNOSIS — M25519 Pain in unspecified shoulder: Secondary | ICD-10-CM | POA: Diagnosis not present

## 2013-06-03 DIAGNOSIS — M5412 Radiculopathy, cervical region: Secondary | ICD-10-CM | POA: Diagnosis not present

## 2013-06-03 DIAGNOSIS — M47812 Spondylosis without myelopathy or radiculopathy, cervical region: Secondary | ICD-10-CM | POA: Diagnosis not present

## 2013-06-03 DIAGNOSIS — M25519 Pain in unspecified shoulder: Secondary | ICD-10-CM | POA: Diagnosis not present

## 2013-06-03 DIAGNOSIS — M19019 Primary osteoarthritis, unspecified shoulder: Secondary | ICD-10-CM | POA: Diagnosis not present

## 2013-06-07 DIAGNOSIS — M25519 Pain in unspecified shoulder: Secondary | ICD-10-CM | POA: Diagnosis not present

## 2013-06-13 DIAGNOSIS — M25819 Other specified joint disorders, unspecified shoulder: Secondary | ICD-10-CM | POA: Diagnosis not present

## 2013-06-13 DIAGNOSIS — M19019 Primary osteoarthritis, unspecified shoulder: Secondary | ICD-10-CM | POA: Diagnosis not present

## 2013-06-13 DIAGNOSIS — M5412 Radiculopathy, cervical region: Secondary | ICD-10-CM | POA: Diagnosis not present

## 2013-06-17 DIAGNOSIS — M67919 Unspecified disorder of synovium and tendon, unspecified shoulder: Secondary | ICD-10-CM | POA: Diagnosis not present

## 2013-06-17 DIAGNOSIS — M719 Bursopathy, unspecified: Secondary | ICD-10-CM | POA: Diagnosis not present

## 2013-06-17 DIAGNOSIS — S4350XA Sprain of unspecified acromioclavicular joint, initial encounter: Secondary | ICD-10-CM | POA: Diagnosis not present

## 2013-06-17 DIAGNOSIS — M5412 Radiculopathy, cervical region: Secondary | ICD-10-CM | POA: Diagnosis not present

## 2013-07-03 ENCOUNTER — Encounter: Payer: Self-pay | Admitting: Obstetrics and Gynecology

## 2013-07-03 ENCOUNTER — Ambulatory Visit (INDEPENDENT_AMBULATORY_CARE_PROVIDER_SITE_OTHER): Payer: Medicare Other | Admitting: Obstetrics and Gynecology

## 2013-07-03 VITALS — BP 122/74 | HR 84 | Resp 16 | Ht 61.75 in | Wt 166.4 lb

## 2013-07-03 DIAGNOSIS — N3281 Overactive bladder: Secondary | ICD-10-CM

## 2013-07-03 DIAGNOSIS — N644 Mastodynia: Secondary | ICD-10-CM

## 2013-07-03 DIAGNOSIS — Z124 Encounter for screening for malignant neoplasm of cervix: Secondary | ICD-10-CM | POA: Diagnosis not present

## 2013-07-03 DIAGNOSIS — Z01419 Encounter for gynecological examination (general) (routine) without abnormal findings: Secondary | ICD-10-CM

## 2013-07-03 DIAGNOSIS — N318 Other neuromuscular dysfunction of bladder: Secondary | ICD-10-CM | POA: Diagnosis not present

## 2013-07-03 MED ORDER — SOLIFENACIN SUCCINATE 5 MG PO TABS: 5.0000 mg | ORAL_TABLET | Freq: Every day | ORAL | Status: DC

## 2013-07-03 NOTE — Progress Notes (Signed)
Appointment made for diagnostic bilateral and bilateral ultrasound. Appointment made with patient in office for [facility] on 07/11/13 at 9:15.  Patient agreeable to time and date.

## 2013-07-03 NOTE — Progress Notes (Signed)
Patient ID: Mallory Watts, female   DOB: [birth year], 73 y.o.   MRN: [id number] GYNECOLOGY VISIT  PCP:   Hadassah Pais, MD  Referring provider:   HPI: 73 y.o.   Single  Caucasian  female   G0P0 with Patient's last menstrual period was 04/11/1994.   here for   AEX. Burning sensation in both breasts for one week.  Having some itching of both nipples.  Skin looks like an orange.  Some chest tightness.  PCP aware and did breast exam for patient in December 2014 due to this concern.   Has been on prednisone for several months.  Had facial rash and swelling.  Saw PCP and had injection of prednisone and then a taper orally. Also having right shoulder bursitis and treatment with prednisone for this as well. Doing an epidural next week due to the shoulder pain.   On Vesicare for overactive bladder.  Works well but is expensive.  Needs a refill.  Hemorrhoid problems.   Hgb:    PCP Urine:  PCP  GYNECOLOGIC HISTORY: Patient's last menstrual period was 04/11/1994. Sexually active:  no Partner preference: female Contraception:  postmenopausal  Menopausal hormone therapy: no DES exposure:  no  Blood transfusions:  no  Sexually transmitted diseases: HSV   GYN procedures and prior surgeries:  Left lumpectomy for breast cancer 1998 Last mammogram:  07/2012 wnl:[facility]               Last pap and high risk HPV testing: 02/2011 wnl:no HPV testing   History of abnormal pap smear: no    OB History   Grav Para Term Preterm Abortions TAB SAB Ect Mult Living   0                LIFESTYLE: Exercise:  no             Tobacco: no Alcohol:    3 glasses of wine per week Drug use:  no  OTHER HEALTH MAINTENANCE: Tetanus/TDap:   Up to date with PCP Gardisil:              n/a Influenza:            01/2013 Zostavax:            Completed with PCP  Bone density:      03/2013 osteopenia with Dr. Virgina Jock (performed in his office) Colonoscopy:      2011 wnl with Dr. Henrene Pastor and advised next  colonoscopy due 2016.  Cholesterol check:  03/2013 wnl with medication.  Family History  Problem Relation Age of Onset  . Diabetes Mother     AODM  . Stroke Mother   . Diabetes Brother   . Stroke Father     Patient Active Problem List   Diagnosis Date Noted  . RECTAL BLEEDING 01/07/2009  . PERSONAL HX COLONIC POLYPS 01/07/2009  . GASTROESOPHAGEAL REFLUX DISEASE, HX OF 08/01/2007  . ESOPHAGEAL STRICTURE 05/19/2005  . ESOPHAGEAL REFLUX 05/19/2005  . GASTRITIS, ACUTE 05/19/2005  . ADENOMATOUS COLONIC POLYP 05/09/2005   Past Medical History  Diagnosis Date  . Cancer 1998    breast cancer--left  . Hypertension   . Osteopenia   . STD (sexually transmitted disease)     HSV  . Urinary incontinence   . Atrial fibrillation     Past Surgical History  Procedure Laterality Date  . Breast surgery  1998    left lumpectomy for breast ca  . Heart bypass  2003    -  single    ALLERGIES: Gadolinium and Sulfonamide derivatives  Current Outpatient Prescriptions  Medication Sig Dispense Refill  . aspirin 81 MG tablet Take 81 mg by mouth daily.      Marland Kitchen atorvastatin (LIPITOR) 80 MG tablet Take 80 mg by mouth daily.      . calcium carbonate (OS-CAL) 600 MG TABS tablet Take 600 mg by mouth 2 (two) times daily with a meal.      . cetirizine (ZYRTEC) 10 MG tablet Take 10 mg by mouth daily.      . Cholecalciferol (VITAMIN D) 400 UNITS capsule Take 400 Units by mouth daily.      Marland Kitchen esomeprazole (NEXIUM) 40 MG capsule Take 40 mg by mouth daily at 12 noon.      . eszopiclone (LUNESTA) 2 MG TABS tablet Take 2 mg by mouth at bedtime.      Marland Kitchen HYDROcodone-acetaminophen (NORCO/VICODIN) 5-325 MG per tablet Take 1 tablet by mouth every 6 (six) hours as needed for moderate pain.      . Magnesium 250 MG TABS Take 250 mg by mouth every other day.      . meloxicam (MOBIC) 15 MG tablet Take 15 mg by mouth as needed.      . metoprolol succinate (TOPROL-XL) 50 MG 24 hr tablet Take 50 mg by mouth 2 (two) times  daily.      . mometasone (NASONEX) 50 MCG/ACT nasal spray Place 2 sprays into the nose daily.      . nitroGLYCERIN (NITRODUR - DOSED IN MG/24 HR) 0.4 mg/hr patch Place 0.4 mg onto the skin as needed.      . potassium chloride (MICRO-K) 10 MEQ CR capsule Take 10 mEq by mouth 2 (two) times daily.      Marland Kitchen triamterene-hydrochlorothiazide (DYAZIDE) 37.5-25 MG per capsule Take 1 capsule by mouth daily.      . valACYclovir (VALTREX) 500 MG tablet Take 500 mg by mouth every other day.      . VESICARE 5 MG tablet Take 5 mg by mouth daily.       No current facility-administered medications for this visit.     ROS:  Pertinent items are noted in HPI.  SOCIAL HISTORY:  Retired.  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   PHYSICAL EXAMINATION:    BP 122/74  Pulse 84  Resp 16  Ht 5' 1.75" (1.568 m)  Wt 166 lb 6.4 oz (75.479 kg)  BMI 30.70 kg/m2  LMP 04/11/1994   Wt Readings from Last 3 Encounters:  07/03/13 166 lb 6.4 oz (75.479 kg)  01/07/09 156 lb (70.761 kg)     Ht Readings from Last 3 Encounters:  07/03/13 5' 1.75" (1.568 m)  01/07/09 5\' 3"  (1.6 m)    General appearance: alert, cooperative and appears stated age Head: Normocephalic, without obvious abnormality, atraumatic Neck: no adenopathy, supple, symmetrical, trachea midline and thyroid not enlarged, symmetric, no tenderness/mass/nodules Lungs: clear to auscultation bilaterally Breasts: Inspection negative on the right, left breast with lateral scar and axillary scar, No nipple retraction or dimpling, No nipple discharge or bleeding, No axillary or supraclavicular adenopathy, Normal to palpation without dominant masses Heart: regular rate and rhythm Abdomen: soft, non-tender; no masses,  no organomegaly Extremities: extremities normal, atraumatic, no cyanosis or edema Skin: Skin color, texture, turgor normal. No rashes or lesions Lymph nodes: Cervical, supraclavicular, and axillary nodes normal. No abnormal inguinal nodes  palpated Neurologic: Grossly normal  Pelvic: External genitalia:  no lesions  Urethra:  normal appearing urethra with no masses, tenderness or lesions              Bartholins and Skenes: normal                 Vagina: normal appearing vagina with normal color and discharge, no lesions              Cervix: normal appearance              Pap and high risk HPV testing done: no.            Bimanual Exam:  Uterus:  uterus is normal size, shape, consistency and nontender                                      Adnexa: normal adnexa in size, nontender and no masses                                      Rectovaginal: Confirms                                      Anus:  normal sphincter tone, no lesions  ASSESSMENT  Normal gynecologic exam. Bilateral breast pain.  History of left breast cancer.  Overactive bladder, controlled with Vesicare.  Osteopenia.  Under care of PCP.   PLAN  Bilateral diagnostic mammogram and breast ultrasound at [facility].  Pap smear and high risk HPV testing not indicated.  Counseled on self breast exam. Refill on Vesicare 5 mg.  #90.  RF 3. Return annually or prn   An After Visit Summary was printed and given to the patient.

## 2013-07-03 NOTE — Patient Instructions (Signed)

## 2013-07-09 DIAGNOSIS — M5412 Radiculopathy, cervical region: Secondary | ICD-10-CM | POA: Diagnosis not present

## 2013-07-11 ENCOUNTER — Other Ambulatory Visit: Payer: Medicare Other

## 2013-07-17 ENCOUNTER — Ambulatory Visit
Admission: RE | Admit: 2013-07-17 | Discharge: 2013-07-17 | Disposition: A | Discharge status: Home / Self Care | Payer: Medicare Other | Source: Ambulatory Visit | Attending: Obstetrics and Gynecology | Admitting: Obstetrics and Gynecology

## 2013-07-17 DIAGNOSIS — N644 Mastodynia: Secondary | ICD-10-CM

## 2013-07-17 DIAGNOSIS — Z853 Personal history of malignant neoplasm of breast: Secondary | ICD-10-CM | POA: Diagnosis not present

## 2013-07-17 DIAGNOSIS — R928 Other abnormal and inconclusive findings on diagnostic imaging of breast: Secondary | ICD-10-CM | POA: Diagnosis not present

## 2013-08-06 DIAGNOSIS — M5412 Radiculopathy, cervical region: Secondary | ICD-10-CM | POA: Diagnosis not present

## 2013-08-16 DIAGNOSIS — Z6830 Body mass index (BMI) 30.0-30.9, adult: Secondary | ICD-10-CM | POA: Diagnosis not present

## 2013-08-16 DIAGNOSIS — B351 Tinea unguium: Secondary | ICD-10-CM | POA: Diagnosis not present

## 2013-08-19 DIAGNOSIS — I4892 Unspecified atrial flutter: Secondary | ICD-10-CM | POA: Diagnosis not present

## 2013-08-19 DIAGNOSIS — I119 Hypertensive heart disease without heart failure: Secondary | ICD-10-CM | POA: Diagnosis not present

## 2013-08-19 DIAGNOSIS — E785 Hyperlipidemia, unspecified: Secondary | ICD-10-CM | POA: Diagnosis not present

## 2013-08-19 DIAGNOSIS — Z951 Presence of aortocoronary bypass graft: Secondary | ICD-10-CM | POA: Diagnosis not present

## 2013-08-19 DIAGNOSIS — I2510 Atherosclerotic heart disease of native coronary artery without angina pectoris: Secondary | ICD-10-CM | POA: Diagnosis not present

## 2013-08-21 DIAGNOSIS — B351 Tinea unguium: Secondary | ICD-10-CM | POA: Diagnosis not present

## 2013-08-21 DIAGNOSIS — M79609 Pain in unspecified limb: Secondary | ICD-10-CM | POA: Diagnosis not present

## 2013-08-21 DIAGNOSIS — M25579 Pain in unspecified ankle and joints of unspecified foot: Secondary | ICD-10-CM | POA: Diagnosis not present

## 2013-08-26 DIAGNOSIS — M5412 Radiculopathy, cervical region: Secondary | ICD-10-CM | POA: Diagnosis not present

## 2013-09-05 ENCOUNTER — Telehealth: Payer: Self-pay | Admitting: Obstetrics and Gynecology

## 2013-09-05 NOTE — Telephone Encounter (Signed)
Lm for pt about her records being ready for her to pick up will shred 09/12/13

## 2013-09-06 NOTE — Telephone Encounter (Signed)
Pt will pick up records 1st of next week.

## 2013-09-18 DIAGNOSIS — D2370 Other benign neoplasm of skin of unspecified lower limb, including hip: Secondary | ICD-10-CM | POA: Diagnosis not present

## 2013-09-18 DIAGNOSIS — M79609 Pain in unspecified limb: Secondary | ICD-10-CM | POA: Diagnosis not present

## 2013-09-18 DIAGNOSIS — B351 Tinea unguium: Secondary | ICD-10-CM | POA: Diagnosis not present

## 2013-09-30 DIAGNOSIS — M47812 Spondylosis without myelopathy or radiculopathy, cervical region: Secondary | ICD-10-CM | POA: Diagnosis not present

## 2013-09-30 DIAGNOSIS — M19019 Primary osteoarthritis, unspecified shoulder: Secondary | ICD-10-CM | POA: Diagnosis not present

## 2013-09-30 DIAGNOSIS — M542 Cervicalgia: Secondary | ICD-10-CM | POA: Diagnosis not present

## 2013-09-30 DIAGNOSIS — M5412 Radiculopathy, cervical region: Secondary | ICD-10-CM | POA: Diagnosis not present

## 2013-10-04 DIAGNOSIS — M25519 Pain in unspecified shoulder: Secondary | ICD-10-CM | POA: Diagnosis not present

## 2013-10-04 DIAGNOSIS — M5030 Other cervical disc degeneration, unspecified cervical region: Secondary | ICD-10-CM | POA: Diagnosis not present

## 2013-10-04 DIAGNOSIS — E785 Hyperlipidemia, unspecified: Secondary | ICD-10-CM | POA: Diagnosis not present

## 2013-10-04 DIAGNOSIS — I10 Essential (primary) hypertension: Secondary | ICD-10-CM | POA: Diagnosis not present

## 2013-10-04 DIAGNOSIS — I4892 Unspecified atrial flutter: Secondary | ICD-10-CM | POA: Diagnosis not present

## 2013-10-04 DIAGNOSIS — I2510 Atherosclerotic heart disease of native coronary artery without angina pectoris: Secondary | ICD-10-CM | POA: Diagnosis not present

## 2013-10-04 DIAGNOSIS — IMO0002 Reserved for concepts with insufficient information to code with codable children: Secondary | ICD-10-CM | POA: Diagnosis not present

## 2013-10-04 DIAGNOSIS — Z23 Encounter for immunization: Secondary | ICD-10-CM | POA: Diagnosis not present

## 2013-10-14 DIAGNOSIS — M5412 Radiculopathy, cervical region: Secondary | ICD-10-CM | POA: Diagnosis not present

## 2013-10-16 DIAGNOSIS — B351 Tinea unguium: Secondary | ICD-10-CM | POA: Diagnosis not present

## 2013-10-16 DIAGNOSIS — M79609 Pain in unspecified limb: Secondary | ICD-10-CM | POA: Diagnosis not present

## 2013-10-24 DIAGNOSIS — R945 Abnormal results of liver function studies: Secondary | ICD-10-CM | POA: Diagnosis not present

## 2013-10-24 DIAGNOSIS — I10 Essential (primary) hypertension: Secondary | ICD-10-CM | POA: Diagnosis not present

## 2013-11-25 DIAGNOSIS — M79609 Pain in unspecified limb: Secondary | ICD-10-CM | POA: Diagnosis not present

## 2013-12-03 DIAGNOSIS — M19019 Primary osteoarthritis, unspecified shoulder: Secondary | ICD-10-CM | POA: Diagnosis not present

## 2013-12-03 DIAGNOSIS — M25819 Other specified joint disorders, unspecified shoulder: Secondary | ICD-10-CM | POA: Diagnosis not present

## 2013-12-17 DIAGNOSIS — Z961 Presence of intraocular lens: Secondary | ICD-10-CM | POA: Diagnosis not present

## 2013-12-17 DIAGNOSIS — H35379 Puckering of macula, unspecified eye: Secondary | ICD-10-CM | POA: Diagnosis not present

## 2013-12-17 DIAGNOSIS — H26499 Other secondary cataract, unspecified eye: Secondary | ICD-10-CM | POA: Diagnosis not present

## 2013-12-17 DIAGNOSIS — H04129 Dry eye syndrome of unspecified lacrimal gland: Secondary | ICD-10-CM | POA: Diagnosis not present

## 2013-12-17 DIAGNOSIS — H524 Presbyopia: Secondary | ICD-10-CM | POA: Diagnosis not present

## 2014-01-02 DIAGNOSIS — M479 Spondylosis, unspecified: Secondary | ICD-10-CM | POA: Diagnosis not present

## 2014-01-02 DIAGNOSIS — Z6829 Body mass index (BMI) 29.0-29.9, adult: Secondary | ICD-10-CM | POA: Diagnosis not present

## 2014-01-02 DIAGNOSIS — M5412 Radiculopathy, cervical region: Secondary | ICD-10-CM | POA: Diagnosis not present

## 2014-01-08 ENCOUNTER — Encounter (HOSPITAL_COMMUNITY): Payer: Self-pay

## 2014-01-08 ENCOUNTER — Other Ambulatory Visit: Payer: Self-pay | Admitting: Neurological Surgery

## 2014-01-11 NOTE — Pre-Procedure Instructions (Signed)
Mallory Watts  01/11/2014   Your procedure is scheduled on:  October 6  Report to [facility] Admitting at 08:15 AM.  Call this number if you have problems the morning of surgery: [phone]   Remember:   Do not eat food or drink liquids after midnight.   Take these medicines the morning of surgery with A SIP OF WATER: Zyrtec, Nexium, Hydrocodone OR Oxycodone (if needed), Metoprolol, Nasonex, Valtrex (if due)   STOP/ Do not take Meloxicam, Aspirin, Aleve, Naproxen, Advil, Ibuprofen, Motrin, Vitamins, Herbs, or Supplements starting today   Do not wear jewelry, make-up or nail polish.  Do not wear lotions, powders, or perfumes. You may wear deodorant.  Do not shave 48 hours prior to surgery. Men may shave face and neck.  Do not bring valuables to the hospital.  [facility] is not responsible for any belongings or valuables.               Contacts, dentures or bridgework may not be worn into surgery.  Leave suitcase in the car. After surgery it may be brought to your room.  For patients admitted to the hospital, discharge time is determined by your treatment team.               Special Instructions: [place] - Preparing for Surgery  Before surgery, you can play an important role.  Because skin is not sterile, your skin needs to be as free of germs as possible.  You can reduce the number of germs on you skin by washing with CHG (chlorahexidine gluconate) soap before surgery.  CHG is an antiseptic cleaner which kills germs and bonds with the skin to continue killing germs even after washing.  Please DO NOT use if you have an allergy to CHG or antibacterial soaps.  If your skin becomes reddened/irritated stop using the CHG and inform your nurse when you arrive at Short Stay.  Do not shave (including legs and underarms) for at least 48 hours prior to the first CHG shower.  You may shave your face.  Please follow these instructions carefully:   1.  Shower with CHG Soap the  night before surgery and the morning of Surgery.  2.  If you choose to wash your hair, wash your hair first as usual with your normal shampoo.  3.  After you shampoo, rinse your hair and body thoroughly to remove the shampoo.  4.  Use CHG as you would any other liquid soap.  You can apply CHG directly to the skin and wash gently with scrungie or a clean washcloth.  5.  Apply the CHG Soap to your body ONLY FROM THE NECK DOWN.  Do not use on open wounds or open sores.  Avoid contact with your eyes, ears, mouth and genitals (private parts).  Wash genitals (private parts) with your normal soap.  6.  Wash thoroughly, paying special attention to the area where your surgery will be performed.  7.  Thoroughly rinse your body with warm water from the neck down.  8.  DO NOT shower/wash with your normal soap after using and rinsing off the CHG Soap.  9.  Pat yourself dry with a clean towel.            10.  Wear clean pajamas.            11.  Place clean sheets on your bed the night of your first shower and do not sleep with pets.  Day of Surgery  Do not apply any lotions the morning of surgery.  Please wear clean clothes to the hospital/surgery center.     Please read over the following fact sheets that you were given: Pain Booklet, Coughing and Deep Breathing and Surgical Site Infection Prevention

## 2014-01-13 ENCOUNTER — Ambulatory Visit (HOSPITAL_COMMUNITY)
Admission: RE | Admit: 2014-01-13 | Discharge: 2014-01-13 | Disposition: A | Discharge status: Home / Self Care | Payer: Medicare Other | Source: Ambulatory Visit | Attending: Anesthesiology | Admitting: Anesthesiology

## 2014-01-13 ENCOUNTER — Encounter (HOSPITAL_COMMUNITY): Payer: Self-pay

## 2014-01-13 ENCOUNTER — Encounter (HOSPITAL_COMMUNITY)
Admission: RE | Admit: 2014-01-13 | Discharge: 2014-01-13 | Disposition: A | Discharge status: Home / Self Care | Payer: Medicare Other | Source: Ambulatory Visit | Attending: Neurological Surgery | Admitting: Neurological Surgery

## 2014-01-13 DIAGNOSIS — M4722 Other spondylosis with radiculopathy, cervical region: Secondary | ICD-10-CM | POA: Diagnosis not present

## 2014-01-13 DIAGNOSIS — Z853 Personal history of malignant neoplasm of breast: Secondary | ICD-10-CM | POA: Diagnosis not present

## 2014-01-13 DIAGNOSIS — Z01818 Encounter for other preprocedural examination: Secondary | ICD-10-CM | POA: Diagnosis not present

## 2014-01-13 DIAGNOSIS — M4712 Other spondylosis with myelopathy, cervical region: Secondary | ICD-10-CM | POA: Diagnosis not present

## 2014-01-13 DIAGNOSIS — I10 Essential (primary) hypertension: Secondary | ICD-10-CM | POA: Diagnosis not present

## 2014-01-13 HISTORY — DX: Unspecified osteoarthritis, unspecified site: M19.90

## 2014-01-13 HISTORY — DX: Gastro-esophageal reflux disease without esophagitis: K21.9

## 2014-01-13 HISTORY — DX: Nausea with vomiting, unspecified: R11.2

## 2014-01-13 HISTORY — DX: Other specified postprocedural states: Z98.890

## 2014-01-13 HISTORY — DX: Cardiac arrhythmia, unspecified: I49.9

## 2014-01-13 HISTORY — DX: Shortness of breath: R06.02

## 2014-01-13 LAB — BASIC METABOLIC PANEL
ANION GAP: 10 (ref 5–15)
BUN: 19 mg/dL (ref 6–23)
CHLORIDE: 104 meq/L (ref 96–112)
CO2: 28 meq/L (ref 19–32)
CREATININE: 1.09 mg/dL (ref 0.50–1.10)
Calcium: 9 mg/dL (ref 8.4–10.5)
GFR calc Af Amer: 58 mL/min — ABNORMAL LOW (ref >90)
GFR calc non Af Amer: 50 mL/min — ABNORMAL LOW (ref >90)
Glucose, Bld: 96 mg/dL (ref 70–99)
Potassium: 4 mEq/L (ref 3.7–5.3)
Sodium: 142 mEq/L (ref 137–147)

## 2014-01-13 LAB — SURGICAL PCR SCREEN
MRSA, PCR: NEGATIVE
Staphylococcus aureus: POSITIVE — AB

## 2014-01-13 LAB — CBC
HCT: 42.9 % (ref 36.0–46.0)
Hemoglobin: 14.4 g/dL (ref 12.0–15.0)
MCH: 31 pg (ref 26.0–34.0)
MCHC: 33.6 g/dL (ref 30.0–36.0)
MCV: 92.3 fL (ref 78.0–100.0)
PLATELETS: 195 10*3/uL (ref 150–400)
RBC: 4.65 MIL/uL (ref 3.87–5.11)
RDW: 13.8 % (ref 11.5–15.5)
WBC: 5.7 10*3/uL (ref 4.0–10.5)

## 2014-01-13 NOTE — Progress Notes (Signed)
Primary - dr. [name] Cardiologist - dr. [name] Will request ekg, stress, last ov

## 2014-01-13 NOTE — H&P (Signed)
CHIEF COMPLAINT:                                          Neck pain, right shoulder and arm pain, nerve compression and weakness in the right hand.  HISTORY OF PRESENT ILLNESS:                     Mallory Watts is a 73 year old right-handed individual who tells me that she has had problems since about September of last year.  She has had pain in the region of the neck, right shoulder, and arm and she has noted that the arm feels weak.  She finds that lifting things from overhead causes her to get to a point where she feels that she is going to drop them unless she props things with the right arm.  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  She has had this problem going on for the last year and she notes that when she was put on some oral prednisone she found her symptoms to be better.  She also notes that they are somewhat better after she had an epidural steroid injection which was done by Dr. Jacelyn Grip.  Nonetheless, the problem persists when she is off the medication, has been continuing and she feels somewhat worse over time.  After some evaluation and discussion by Dr. Eddie Dibbles it was felt that she had both a cervical problem and possibly some rotator cuff issues in the right shoulder.  Dr. Eddie Dibbles suggested further evaluation and through the referral of number of friends, she is seen now for evaluation of her cervical spine.  An MRI of the cervical spine was performed in February and this demonstrates that the patient has evidence of autofusion of C3 on C4.  C4 C5 has significant spondylosis with right lateral recess stenosis with the C5 nerve root.  Also, C5-6 has significant posterior osteophytes with again substantial stenosis for the right C6 nerve root.  Today in the office to further her workup, I obtained a lateral flexion and extension x-ray of the neck in addition to a singular AP view.  It demonstrates that the alignment of her neck is good in both  the coronal and the sagittal planes.  It also confirms the autofusion of C3-4, but I note that she has substantial osteophytosis at C4-5 and again at C5-6.  There is some moderate degenerative change at C6-7.  REVIEW OF SYSTEMS:                                    Notable for wearing glasses, history of cataracts, having had surgery in 2014, ringing in the ears, balance disturbance, nasal congestion and drainage, sinus problems, high blood pressure, high cholesterol, leg pain while walking, fatigue, and shortness of breath, arthritis, neck pain, arm pain, leg weakness, and some inhalant allergies, difficulty with concentration, also noted all on a 14-point review sheet.  PAST MEDICAL HISTORY:    Current Medical Conditions:  Reveals that she has some hypertension.  She has had atrial flutter in the past.  She has had some coronary artery disease.  She has had some reflux and she has a history of breast cancer.    Medications and Allergies:  Current medications atorvastatin, valacyclovir, potassium, metoprolol, VESIcare, aspirin, magnesium, calcium citrate, vitamin D3, triamterene, oxycodone for pain, prednisone, nuvail, Nexium, Lunesta, and Nasonex.  SHE NOTES AN ALLERGY TO SULFA ANTIBIOTICS.  SOCIAL HISTORY:                                            Reveals that she does not smoke.  She drinks alcohol socially.  PHYSICAL EXAMINATION:                                Height and weight have been stable at 5 feet 3 inches and 160 pounds.  On physical examination, I note that she is able to turn her head 60 degrees left and right.  She flexes and extends about 80% of normal.  Axial compression does not reproduce any centralized pain.  There is tenderness in the supraclavicular fossa more so on the right than on the left.  Her motor strength demonstrates that she has good strength in the deltoid and 4+ on the right side and 5 on the left.  Tone and bulk in the muscle is good.  Biceps, triceps, wrist extensor  strength is all 5/5.  Grip strength is slightly decreased on the right side compared to the left side, but overall intact.  IMPRESSION/PLAN:                                         The patient has evidence of advanced spondylosis at C4-5 and C5-6.  She has an autofusion of C3-4 by plain x-ray and MRI.  She has marked right-sided foraminal stenosis for the C5 and the C6 nerve root.  I believe that her symptoms are consistent with radicular changes in the C5 and the C6 distribution causing and aggravating the pain in that right shoulder.  I have advised that the patient would benefit from two-level anterior decompression and arthrodesis at C4-5 and C5-6.  To relieve pressure on a C5 nerve root, I am hopefully stabilize the cervical spine to prevent that irritability.  The possibility that she also has an separate rotator cuff lesion is noted and it may be that she needs separate attention to that down the road.  Given the fact that she is experiencing weakness in that right arm, I believe that surgical decompression would be in her best interest for worse nerve damage occurs or any of the changes become particularly permanent.  I discussed the surgical technique via the front of the neck placing a bone graft, placing a Titanium plate over the vertebrae.  I noted that the surgery requires movement of the trach and esophagus in one direction, carotid artery and jugular vein in the opposite direction.  Certainly, these are the structures that are at risk with the surgery, but generally the surgery is well tolerated and most individuals are sent home on the same day or worst with an overnight stay.  We can schedule the surgery at her convenience.  I noted that initial few weeks cause some difficulty with swallowing or shoulder discomfort and over time patients are able to return to full activity once the solid arthrodesis is achieved.  We will schedule at her convenience.

## 2014-01-13 NOTE — Progress Notes (Signed)
rx called in to [facility] per patient request

## 2014-01-14 ENCOUNTER — Encounter (HOSPITAL_COMMUNITY): Payer: Medicare Other | Admitting: Certified Registered Nurse Anesthetist

## 2014-01-14 ENCOUNTER — Inpatient Hospital Stay (HOSPITAL_COMMUNITY): Payer: Medicare Other | Admitting: Certified Registered Nurse Anesthetist

## 2014-01-14 ENCOUNTER — Encounter (HOSPITAL_COMMUNITY)
Admission: RE | Disposition: A | Discharge status: Home / Self Care | Payer: Self-pay | Source: Ambulatory Visit | Attending: Neurological Surgery

## 2014-01-14 ENCOUNTER — Encounter (HOSPITAL_COMMUNITY): Payer: Self-pay | Admitting: Certified Registered Nurse Anesthetist

## 2014-01-14 ENCOUNTER — Ambulatory Visit (HOSPITAL_COMMUNITY)
Admission: RE | Admit: 2014-01-14 | Discharge: 2014-01-15 | Disposition: A | Discharge status: Home / Self Care | Payer: Medicare Other | Source: Ambulatory Visit | Attending: Neurological Surgery | Admitting: Neurological Surgery

## 2014-01-14 ENCOUNTER — Ambulatory Visit (HOSPITAL_COMMUNITY): Payer: Medicare Other

## 2014-01-14 DIAGNOSIS — R0602 Shortness of breath: Secondary | ICD-10-CM | POA: Diagnosis not present

## 2014-01-14 DIAGNOSIS — I10 Essential (primary) hypertension: Secondary | ICD-10-CM | POA: Insufficient documentation

## 2014-01-14 DIAGNOSIS — Z853 Personal history of malignant neoplasm of breast: Secondary | ICD-10-CM | POA: Insufficient documentation

## 2014-01-14 DIAGNOSIS — M4722 Other spondylosis with radiculopathy, cervical region: Principal | ICD-10-CM | POA: Insufficient documentation

## 2014-01-14 DIAGNOSIS — M4712 Other spondylosis with myelopathy, cervical region: Secondary | ICD-10-CM

## 2014-01-14 DIAGNOSIS — M5412 Radiculopathy, cervical region: Secondary | ICD-10-CM | POA: Diagnosis not present

## 2014-01-14 DIAGNOSIS — K219 Gastro-esophageal reflux disease without esophagitis: Secondary | ICD-10-CM | POA: Insufficient documentation

## 2014-01-14 DIAGNOSIS — M4322 Fusion of spine, cervical region: Secondary | ICD-10-CM | POA: Diagnosis not present

## 2014-01-14 DIAGNOSIS — M1990 Unspecified osteoarthritis, unspecified site: Secondary | ICD-10-CM | POA: Diagnosis not present

## 2014-01-14 HISTORY — PX: ANTERIOR CERVICAL DECOMP/DISCECTOMY FUSION: SHX1161

## 2014-01-14 SURGERY — ANTERIOR CERVICAL DECOMPRESSION/DISCECTOMY FUSION 2 LEVELS
Anesthesia: General

## 2014-01-14 MED ORDER — SODIUM CHLORIDE 0.9 % IR SOLN
Status: DC | PRN
  Administered 2014-01-14: 12:00:00

## 2014-01-14 MED ORDER — DARIFENACIN HYDROBROMIDE ER 7.5 MG PO TB24
7.5000 mg | ORAL_TABLET | Freq: Every day | ORAL | Status: DC
  Filled 2014-01-14 (×2): qty 1

## 2014-01-14 MED ORDER — MIDAZOLAM HCL 2 MG/2ML IJ SOLN
INTRAMUSCULAR | Status: AC
  Filled 2014-01-14: qty 2

## 2014-01-14 MED ORDER — DOCUSATE SODIUM 100 MG PO CAPS
100.0000 mg | ORAL_CAPSULE | Freq: Two times a day (BID) | ORAL | Status: DC
  Administered 2014-01-14: 100 mg via ORAL
  Filled 2014-01-14 (×3): qty 1

## 2014-01-14 MED ORDER — POLYVINYL ALCOHOL 1.4 % OP SOLN
1.0000 [drp] | OPHTHALMIC | Status: DC | PRN
  Filled 2014-01-14: qty 15

## 2014-01-14 MED ORDER — ONDANSETRON HCL 4 MG/2ML IJ SOLN
INTRAMUSCULAR | Status: AC
  Filled 2014-01-14: qty 2

## 2014-01-14 MED ORDER — NEOSTIGMINE METHYLSULFATE 10 MG/10ML IV SOLN
INTRAVENOUS | Status: DC | PRN
  Administered 2014-01-14: 4 mg via INTRAVENOUS

## 2014-01-14 MED ORDER — CEFAZOLIN SODIUM 1-5 GM-% IV SOLN
1.0000 g | Freq: Three times a day (TID) | INTRAVENOUS | Status: AC
  Administered 2014-01-14 – 2014-01-15 (×2): 1 g via INTRAVENOUS
  Filled 2014-01-14 (×2): qty 50

## 2014-01-14 MED ORDER — HYDROMORPHONE HCL 1 MG/ML IJ SOLN: 0.5000 mg | INTRAMUSCULAR | Status: DC | PRN

## 2014-01-14 MED ORDER — THROMBIN 5000 UNITS EX SOLR
CUTANEOUS | Status: DC | PRN
  Administered 2014-01-14 (×2): 5000 [IU] via TOPICAL

## 2014-01-14 MED ORDER — CEFAZOLIN SODIUM-DEXTROSE 2-3 GM-% IV SOLR
INTRAVENOUS | Status: DC | PRN
  Administered 2014-01-14: 2 g via INTRAVENOUS

## 2014-01-14 MED ORDER — TRIAMTERENE-HCTZ 37.5-25 MG PO CAPS
1.0000 | ORAL_CAPSULE | Freq: Every day | ORAL | Status: DC
  Filled 2014-01-14 (×2): qty 1

## 2014-01-14 MED ORDER — PROMETHAZINE HCL 25 MG/ML IJ SOLN: 6.2500 mg | INTRAMUSCULAR | Status: DC | PRN

## 2014-01-14 MED ORDER — GELATIN ABSORBABLE MT POWD
OROMUCOSAL | Status: DC | PRN
  Administered 2014-01-14: 14:00:00 via TOPICAL

## 2014-01-14 MED ORDER — ONDANSETRON HCL 4 MG/2ML IJ SOLN
INTRAMUSCULAR | Status: DC | PRN
Start: 2014-01-14 — End: 2014-01-14
  Administered 2014-01-14: 4 mg via INTRAVENOUS

## 2014-01-14 MED ORDER — MUPIROCIN 2 % EX OINT
1.0000 | TOPICAL_OINTMENT | Freq: Two times a day (BID) | CUTANEOUS | Status: DC
Start: 2014-01-14 — End: 2014-01-15

## 2014-01-14 MED ORDER — LIDOCAINE HCL (CARDIAC) 20 MG/ML IV SOLN
INTRAVENOUS | Status: AC
  Filled 2014-01-14: qty 15

## 2014-01-14 MED ORDER — METOPROLOL SUCCINATE ER 50 MG PO TB24
50.0000 mg | ORAL_TABLET | Freq: Two times a day (BID) | ORAL | Status: DC
  Filled 2014-01-14 (×3): qty 1

## 2014-01-14 MED ORDER — VALACYCLOVIR HCL 500 MG PO TABS
500.0000 mg | ORAL_TABLET | ORAL | Status: DC
  Filled 2014-01-14: qty 1

## 2014-01-14 MED ORDER — DIPHENHYDRAMINE HCL 50 MG/ML IJ SOLN
INTRAMUSCULAR | Status: AC
  Filled 2014-01-14: qty 1

## 2014-01-14 MED ORDER — SODIUM CHLORIDE 0.9 % IJ SOLN
3.0000 mL | Freq: Two times a day (BID) | INTRAMUSCULAR | Status: DC
  Administered 2014-01-14: 3 mL via INTRAVENOUS

## 2014-01-14 MED ORDER — LORATADINE 10 MG PO TABS
10.0000 mg | ORAL_TABLET | Freq: Every day | ORAL | Status: DC
  Filled 2014-01-14 (×2): qty 1

## 2014-01-14 MED ORDER — SUCCINYLCHOLINE CHLORIDE 20 MG/ML IJ SOLN
INTRAMUSCULAR | Status: DC | PRN
  Administered 2014-01-14: 100 mg via INTRAVENOUS

## 2014-01-14 MED ORDER — ROCURONIUM BROMIDE 50 MG/5ML IV SOLN
INTRAVENOUS | Status: AC
  Filled 2014-01-14: qty 1

## 2014-01-14 MED ORDER — FENTANYL CITRATE 0.05 MG/ML IJ SOLN
INTRAMUSCULAR | Status: AC
  Filled 2014-01-14: qty 5

## 2014-01-14 MED ORDER — SENNA 8.6 MG PO TABS
1.0000 | ORAL_TABLET | Freq: Two times a day (BID) | ORAL | Status: DC
  Administered 2014-01-14: 8.6 mg via ORAL
  Filled 2014-01-14 (×3): qty 1

## 2014-01-14 MED ORDER — ROCURONIUM BROMIDE 100 MG/10ML IV SOLN
INTRAVENOUS | Status: DC | PRN
  Administered 2014-01-14: 10 mg via INTRAVENOUS
  Administered 2014-01-14: 40 mg via INTRAVENOUS

## 2014-01-14 MED ORDER — POTASSIUM CHLORIDE ER 10 MEQ PO CPCR: 10.0000 meq | ORAL_CAPSULE | Freq: Two times a day (BID) | ORAL | Status: DC

## 2014-01-14 MED ORDER — SCOPOLAMINE 1 MG/3DAYS TD PT72
MEDICATED_PATCH | TRANSDERMAL | Status: AC
  Administered 2014-01-14: 1 via TRANSDERMAL
  Filled 2014-01-14: qty 1

## 2014-01-14 MED ORDER — SODIUM CHLORIDE 0.9 % IV SOLN: 250.0000 mL | INTRAVENOUS | Status: DC

## 2014-01-14 MED ORDER — ACETAMINOPHEN 650 MG RE SUPP: 650.0000 mg | RECTAL | Status: DC | PRN

## 2014-01-14 MED ORDER — HYDROCODONE-ACETAMINOPHEN 5-325 MG PO TABS: 1.0000 | ORAL_TABLET | Freq: Four times a day (QID) | ORAL | Status: DC | PRN

## 2014-01-14 MED ORDER — OXYCODONE HCL 5 MG/5ML PO SOLN: 5.0000 mg | Freq: Once | ORAL | Status: DC | PRN

## 2014-01-14 MED ORDER — FENTANYL CITRATE 0.05 MG/ML IJ SOLN
INTRAMUSCULAR | Status: DC | PRN
  Administered 2014-01-14: 50 ug via INTRAVENOUS
  Administered 2014-01-14: 100 ug via INTRAVENOUS
  Administered 2014-01-14: 50 ug via INTRAVENOUS

## 2014-01-14 MED ORDER — SUCCINYLCHOLINE CHLORIDE 20 MG/ML IJ SOLN
INTRAMUSCULAR | Status: AC
  Filled 2014-01-14: qty 2

## 2014-01-14 MED ORDER — FLEET ENEMA 7-19 GM/118ML RE ENEM
1.0000 | ENEMA | Freq: Once | RECTAL | Status: AC | PRN
  Filled 2014-01-14: qty 1

## 2014-01-14 MED ORDER — ZOLPIDEM TARTRATE 5 MG PO TABS: 5.0000 mg | ORAL_TABLET | Freq: Every evening | ORAL | Status: DC | PRN

## 2014-01-14 MED ORDER — KETOROLAC TROMETHAMINE 30 MG/ML IJ SOLN
INTRAMUSCULAR | Status: AC
  Filled 2014-01-14: qty 1

## 2014-01-14 MED ORDER — GLYCOPYRROLATE 0.2 MG/ML IJ SOLN
INTRAMUSCULAR | Status: DC | PRN
  Administered 2014-01-14: 0.6 mg via INTRAVENOUS

## 2014-01-14 MED ORDER — POLYETHYL GLYCOL-PROPYL GLYCOL 0.4-0.3 % OP SOLN: 1.0000 [drp] | OPHTHALMIC | Status: DC | PRN

## 2014-01-14 MED ORDER — POLYETHYLENE GLYCOL 3350 17 G PO PACK
17.0000 g | PACK | Freq: Every day | ORAL | Status: DC | PRN
  Filled 2014-01-14: qty 1

## 2014-01-14 MED ORDER — 0.9 % SODIUM CHLORIDE (POUR BTL) OPTIME
TOPICAL | Status: DC | PRN
  Administered 2014-01-14: 1000 mL

## 2014-01-14 MED ORDER — ARTIFICIAL TEARS OP OINT
TOPICAL_OINTMENT | OPHTHALMIC | Status: DC | PRN
  Administered 2014-01-14: 1 via OPHTHALMIC

## 2014-01-14 MED ORDER — METHOCARBAMOL 500 MG PO TABS: 500.0000 mg | ORAL_TABLET | Freq: Four times a day (QID) | ORAL | Status: DC | PRN

## 2014-01-14 MED ORDER — HEMOSTATIC AGENTS (NO CHARGE) OPTIME
TOPICAL | Status: DC | PRN
  Administered 2014-01-14: 1 via TOPICAL

## 2014-01-14 MED ORDER — OXYCODONE-ACETAMINOPHEN 5-325 MG PO TABS: 1.0000 | ORAL_TABLET | Freq: Three times a day (TID) | ORAL | Status: DC | PRN

## 2014-01-14 MED ORDER — ACETAMINOPHEN 325 MG PO TABS: 650.0000 mg | ORAL_TABLET | ORAL | Status: DC | PRN

## 2014-01-14 MED ORDER — LIDOCAINE HCL (CARDIAC) 20 MG/ML IV SOLN
INTRAVENOUS | Status: DC | PRN
  Administered 2014-01-14: 70 mg via INTRAVENOUS

## 2014-01-14 MED ORDER — PROPOFOL 10 MG/ML IV BOLUS
INTRAVENOUS | Status: DC | PRN
  Administered 2014-01-14: 170 mg via INTRAVENOUS

## 2014-01-14 MED ORDER — DEXAMETHASONE SODIUM PHOSPHATE 4 MG/ML IJ SOLN
INTRAMUSCULAR | Status: AC
  Filled 2014-01-14: qty 3

## 2014-01-14 MED ORDER — MIDAZOLAM HCL 5 MG/5ML IJ SOLN
INTRAMUSCULAR | Status: DC | PRN
Start: 2014-01-14 — End: 2014-01-14
  Administered 2014-01-14: 2 mg via INTRAVENOUS

## 2014-01-14 MED ORDER — SODIUM CHLORIDE 0.9 % IJ SOLN: 3.0000 mL | INTRAMUSCULAR | Status: DC | PRN

## 2014-01-14 MED ORDER — MENTHOL 3 MG MT LOZG
1.0000 | LOZENGE | OROMUCOSAL | Status: DC | PRN
  Filled 2014-01-14: qty 9

## 2014-01-14 MED ORDER — EPHEDRINE SULFATE 50 MG/ML IJ SOLN
INTRAMUSCULAR | Status: DC | PRN
  Administered 2014-01-14: 5 mg via INTRAVENOUS

## 2014-01-14 MED ORDER — PROPOFOL 10 MG/ML IV BOLUS
INTRAVENOUS | Status: AC
  Filled 2014-01-14: qty 20

## 2014-01-14 MED ORDER — LACTATED RINGERS IV SOLN
INTRAVENOUS | Status: DC
  Administered 2014-01-14 (×2): via INTRAVENOUS

## 2014-01-14 MED ORDER — ALUM & MAG HYDROXIDE-SIMETH 200-200-20 MG/5ML PO SUSP: 30.0000 mL | Freq: Four times a day (QID) | ORAL | Status: DC | PRN

## 2014-01-14 MED ORDER — OXYCODONE HCL 5 MG PO TABS: 5.0000 mg | ORAL_TABLET | Freq: Once | ORAL | Status: DC | PRN

## 2014-01-14 MED ORDER — ARTIFICIAL TEARS OP OINT
TOPICAL_OINTMENT | OPHTHALMIC | Status: AC
  Filled 2014-01-14: qty 3.5

## 2014-01-14 MED ORDER — DEXAMETHASONE SODIUM PHOSPHATE 4 MG/ML IJ SOLN
INTRAMUSCULAR | Status: DC | PRN
  Administered 2014-01-14: 10 mg via INTRAVENOUS

## 2014-01-14 MED ORDER — METHOCARBAMOL 1000 MG/10ML IJ SOLN
500.0000 mg | Freq: Four times a day (QID) | INTRAVENOUS | Status: DC | PRN
  Filled 2014-01-14: qty 5

## 2014-01-14 MED ORDER — HYDROCODONE-ACETAMINOPHEN 7.5-325 MG PO TABS
1.0000 | ORAL_TABLET | Freq: Four times a day (QID) | ORAL | Status: DC | PRN
  Administered 2014-01-15: 1 via ORAL
  Filled 2014-01-14: qty 1

## 2014-01-14 MED ORDER — FLUTICASONE PROPIONATE 50 MCG/ACT NA SUSP
1.0000 | Freq: Every day | NASAL | Status: DC
  Filled 2014-01-14: qty 16

## 2014-01-14 MED ORDER — PANTOPRAZOLE SODIUM 40 MG PO TBEC: 80.0000 mg | DELAYED_RELEASE_TABLET | Freq: Every day | ORAL | Status: DC

## 2014-01-14 MED ORDER — BUPIVACAINE HCL (PF) 0.5 % IJ SOLN
INTRAMUSCULAR | Status: DC | PRN
  Administered 2014-01-14: 6 mL

## 2014-01-14 MED ORDER — BISACODYL 10 MG RE SUPP
10.0000 mg | Freq: Every day | RECTAL | Status: DC | PRN
  Administered 2014-01-15: 10 mg via RECTAL
  Filled 2014-01-14: qty 1

## 2014-01-14 MED ORDER — DIPHENHYDRAMINE HCL 50 MG/ML IJ SOLN
INTRAMUSCULAR | Status: DC | PRN
  Administered 2014-01-14: 12.5 mg via INTRAVENOUS

## 2014-01-14 MED ORDER — ONDANSETRON HCL 4 MG/2ML IJ SOLN: 4.0000 mg | INTRAMUSCULAR | Status: DC | PRN

## 2014-01-14 MED ORDER — HYDROMORPHONE HCL 1 MG/ML IJ SOLN
0.2500 mg | INTRAMUSCULAR | Status: DC | PRN
  Administered 2014-01-14 (×2): 0.5 mg via INTRAVENOUS

## 2014-01-14 MED ORDER — OXYCODONE-ACETAMINOPHEN 5-325 MG PO TABS: 1.0000 | ORAL_TABLET | ORAL | Status: DC | PRN

## 2014-01-14 MED ORDER — CEFAZOLIN SODIUM-DEXTROSE 2-3 GM-% IV SOLR
INTRAVENOUS | Status: AC
  Filled 2014-01-14: qty 50

## 2014-01-14 MED ORDER — NITROGLYCERIN 0.4 MG SL SUBL: 0.4000 mg | SUBLINGUAL_TABLET | SUBLINGUAL | Status: DC | PRN

## 2014-01-14 MED ORDER — POTASSIUM CHLORIDE CRYS ER 10 MEQ PO TBCR
10.0000 meq | EXTENDED_RELEASE_TABLET | Freq: Two times a day (BID) | ORAL | Status: DC
  Filled 2014-01-14 (×3): qty 1

## 2014-01-14 MED ORDER — LIDOCAINE-EPINEPHRINE 1 %-1:100000 IJ SOLN
INTRAMUSCULAR | Status: DC | PRN
  Administered 2014-01-14: 6 mL

## 2014-01-14 MED ORDER — PHENOL 1.4 % MT LIQD: 1.0000 | OROMUCOSAL | Status: DC | PRN

## 2014-01-14 MED ORDER — HYDROMORPHONE HCL 1 MG/ML IJ SOLN
INTRAMUSCULAR | Status: AC
  Filled 2014-01-14: qty 1

## 2014-01-14 SURGICAL SUPPLY — 56 items
ALLOGRAFT TRIAD LORDOTIC CC (Bone Implant) ×3 IMPLANT
BAG DECANTER FOR FLEXI CONT (MISCELLANEOUS) ×3 IMPLANT
BIT DRILL NEURO 2X3.1 SFT TUCH (MISCELLANEOUS) ×1 IMPLANT
BNDG GAUZE ELAST 4 BULKY (GAUZE/BANDAGES/DRESSINGS) IMPLANT
BUR BARREL STRAIGHT FLUTE 4.0 (BURR) ×3 IMPLANT
CANISTER SUCT 3000ML (MISCELLANEOUS) ×3 IMPLANT
CONT SPEC 4OZ CLIKSEAL STRL BL (MISCELLANEOUS) ×3 IMPLANT
DECANTER SPIKE VIAL GLASS SM (MISCELLANEOUS) ×3 IMPLANT
DERMABOND ADVANCED (GAUZE/BANDAGES/DRESSINGS) ×2
DERMABOND ADVANCED .7 DNX12 (GAUZE/BANDAGES/DRESSINGS) ×1 IMPLANT
DRAPE LAPAROTOMY 100X72 PEDS (DRAPES) ×3 IMPLANT
DRAPE MICROSCOPE LEICA (MISCELLANEOUS) IMPLANT
DRAPE POUCH INSTRU U-SHP 10X18 (DRAPES) ×3 IMPLANT
DRILL NEURO 2X3.1 SOFT TOUCH (MISCELLANEOUS) ×3
DURAPREP 6ML APPLICATOR 50/CS (WOUND CARE) ×3 IMPLANT
ELECT REM PT RETURN 9FT ADLT (ELECTROSURGICAL) ×3
ELECTRODE REM PT RTRN 9FT ADLT (ELECTROSURGICAL) ×1 IMPLANT
GAUZE SPONGE 4X4 12PLY STRL (GAUZE/BANDAGES/DRESSINGS) ×3 IMPLANT
GAUZE SPONGE 4X4 16PLY XRAY LF (GAUZE/BANDAGES/DRESSINGS) IMPLANT
GLOVE BIO SURGEON STRL SZ8 (GLOVE) ×3 IMPLANT
GLOVE BIOGEL PI IND STRL 8.5 (GLOVE) ×1 IMPLANT
GLOVE BIOGEL PI INDICATOR 8.5 (GLOVE) ×2
GLOVE ECLIPSE 7.5 STRL STRAW (GLOVE) ×12 IMPLANT
GLOVE ECLIPSE 8.5 STRL (GLOVE) ×3 IMPLANT
GLOVE EXAM NITRILE LRG STRL (GLOVE) IMPLANT
GLOVE EXAM NITRILE MD LF STRL (GLOVE) IMPLANT
GLOVE EXAM NITRILE XL STR (GLOVE) IMPLANT
GLOVE EXAM NITRILE XS STR PU (GLOVE) IMPLANT
GLOVE INDICATOR 8.0 STRL GRN (GLOVE) ×3 IMPLANT
GLOVE INDICATOR 8.5 STRL (GLOVE) ×3 IMPLANT
GOWN STRL REUS W/ TWL LRG LVL3 (GOWN DISPOSABLE) IMPLANT
GOWN STRL REUS W/ TWL XL LVL3 (GOWN DISPOSABLE) ×1 IMPLANT
GOWN STRL REUS W/TWL 2XL LVL3 (GOWN DISPOSABLE) ×6 IMPLANT
GOWN STRL REUS W/TWL LRG LVL3 (GOWN DISPOSABLE)
GOWN STRL REUS W/TWL XL LVL3 (GOWN DISPOSABLE) ×2
HALTER HD/CHIN CERV TRACTION D (MISCELLANEOUS) ×3 IMPLANT
HEMOSTAT POWDER SURGIFOAM 1G (HEMOSTASIS) ×3 IMPLANT
INTERLOCK LORDOTIC CC 5X11X14M ×1 IMPLANT
KIT BASIN OR (CUSTOM PROCEDURE TRAY) ×3 IMPLANT
KIT ROOM TURNOVER OR (KITS) ×3 IMPLANT
LORDOTIC CC ALLOGRAFT 5X11X14M ×3 IMPLANT
NEEDLE HYPO 22GX1.5 SAFETY (NEEDLE) ×3 IMPLANT
NEEDLE SPNL 22GX3.5 QUINCKE BK (NEEDLE) ×3 IMPLANT
NS IRRIG 1000ML POUR BTL (IV SOLUTION) ×3 IMPLANT
PACK LAMINECTOMY NEURO (CUSTOM PROCEDURE TRAY) ×3 IMPLANT
PAD ARMBOARD 7.5X6 YLW CONV (MISCELLANEOUS) ×9 IMPLANT
PLATE ARCHON 2-LEVEL 38MM (Plate) ×3 IMPLANT
RUBBERBAND STERILE (MISCELLANEOUS) IMPLANT
SCREW ARCHON SELFTAP 4.0X13 (Screw) ×18 IMPLANT
SPONGE INTESTINAL PEANUT (DISPOSABLE) ×3 IMPLANT
SPONGE SURGIFOAM ABS GEL SZ50 (HEMOSTASIS) ×3 IMPLANT
SUT VIC AB 3-0 SH 8-18 (SUTURE) ×6 IMPLANT
SYR 20ML ECCENTRIC (SYRINGE) ×3 IMPLANT
TOWEL OR 17X24 6PK STRL BLUE (TOWEL DISPOSABLE) ×3 IMPLANT
TOWEL OR 17X26 10 PK STRL BLUE (TOWEL DISPOSABLE) ×3 IMPLANT
WATER STERILE IRR 1000ML POUR (IV SOLUTION) ×3 IMPLANT

## 2014-01-14 NOTE — Anesthesia Postprocedure Evaluation (Signed)
  Anesthesia Post-op Note  Patient: Mallory Watts  Procedure(s) Performed: Procedure(s): ANTERIOR CERVICAL DECOMPRESSION/DISCECTOMY FUSION CERVICAL FOUR-FIVE,CERVICAL FIVE SIX (N/A)  Patient Location: PACU  Anesthesia Type:General  Level of Consciousness: awake and alert   Airway and Oxygen Therapy: Patient Spontanous Breathing  Post-op Pain: mild  Post-op Assessment: Post-op Vital signs reviewed  Post-op Vital Signs: stable  Last Vitals:  Filed Vitals:   01/14/14 1545  BP:   Pulse: 68  Temp: 36.3 C  Resp: 16    Complications: No apparent anesthesia complications

## 2014-01-14 NOTE — Op Note (Signed)
Date of surgery: 01/14/2014 Preoperative diagnosis: Cervical spondylosis with radiculopathy C4-5 C5-6 Post operative diagnosis: Cervical spondylosis with radiculopathy C4-5 C5-6 Procedure: Anterior cervical discectomy decompression of nerve roots and spinal canal C4-5 and C5-6 arthrodesis with structural allograft, nuvasive plate fixation B7-0 W8-8 Surgeon: [name] M.D. Asst.: Erline Levine M.D. Indications: Patient is a 73 year old individual who has had significant neck shoulder and arm pain secondary to spondylitic disease at C4-5 and C5-6. An MRI demonstrates a significant presence of foraminal stenosis on the right side at C4-5 on the left side at C5-6. She's been advised regarding the need for surgical decompression and stabilization via an anterior procedure.  Procedure: The patient was brought to the operating room placed on the table in supine position. After the smooth induction of general endotracheal anesthesia neck was placed in 5 pounds of halter traction and prepped with alcohol and DuraPrep. After sterile draping and appropriate timeout procedure a transverse incision was created in the left side of the neck and carried down to the platysma. The plane between the sternocleidomastoid and strap muscles dissected bluntly until the prevertebral space was reached. The first identifiable disc space was noted to be C4-5 on a localizing radiograph. The dissection was then undertaken in the longus coli muscle to allow placement of a self-retaining Caspar type retractor.  The anterior longitudinal ligament was opened at C4-5 and ventral osteophytes were removed with a Leksell rongeur and Kerrison punch. Interspace was cleared of significant quantity of the degenerated disc material in the region of the posterior longitudinal ligament was removed. Dissection was carried out using a high-speed drill and 3-0 Karlin curettes. Uncinate processes were drilled down and removed and osteophytes from the  inferior margin of the body of C4 were removed with a Kerrison 2 mm gold punch. After the central canal and lateral recesses were well decompressed hemostasis was achieved with the bipolar cautery and some small pledgets of Gelfoam soaked in thrombin that were later irrigated away.  A 5 mm cortical ring allograft was then opened and placed into the interspace. C5-6 Was decompressed and fused in a similar fashion. Takes 6 mm cortical ring allograft was prepared and placed into the interspace at this level.  Next the retractor was removed and a 38 mm nuvasive plate was placed over the vertebral bodies and secured with 13 mm variable angle screws. A final localizing radiograph identified the position of the surgical construct. The stasis was achieved in the soft tissues and then the platysma was closed with 3-0 Vicryl in an interrupted fashion and 3-0 Vicryl was used in the subcuticular tissue. Blood loss was estimated at 25 cc

## 2014-01-14 NOTE — Progress Notes (Signed)
Patient ID: Mallory Watts, female   DOB: Sep 30, 1940, 73 y.o.   MRN: [id number] Patient is awakening nicely. No significant complaints of shoulder arm pain. Incision is clean and dry. Swallow is intact. Stable

## 2014-01-14 NOTE — Transfer of Care (Signed)
Immediate Anesthesia Transfer of Care Note  Patient: Mallory Watts  Procedure(s) Performed: Procedure(s): ANTERIOR CERVICAL DECOMPRESSION/DISCECTOMY FUSION CERVICAL FOUR-FIVE,CERVICAL FIVE SIX (N/A)  Patient Location: PACU  Anesthesia Type:General  Level of Consciousness: awake, alert  and oriented  Airway & Oxygen Therapy: Patient Spontanous Breathing and Patient connected to nasal cannula oxygen  Post-op Assessment: Report given to PACU RN and Post -op Vital signs reviewed and stable  Post vital signs: Reviewed and stable  Complications: No apparent anesthesia complications

## 2014-01-14 NOTE — Anesthesia Preprocedure Evaluation (Addendum)
Anesthesia Evaluation  Patient identified by MRN, date of birth, ID band Patient awake    Reviewed: Allergy & Precautions, H&P , NPO status   History of Anesthesia Complications (+) PONV  Airway Mallampati: III  Neck ROM: Limited  Mouth opening: Limited Mouth Opening  Dental  (+) Teeth Intact   Pulmonary shortness of breath,  breath sounds clear to auscultation        Cardiovascular hypertension, + dysrhythmias Rhythm:Regular Rate:Normal     Neuro/Psych    GI/Hepatic Neg liver ROS, GERD-  ,  Endo/Other  negative endocrine ROS  Renal/GU negative Renal ROS     Musculoskeletal  (+) Arthritis -,   Abdominal   Peds  Hematology negative hematology ROS (+)   Anesthesia Other Findings   Reproductive/Obstetrics                          Anesthesia Physical Anesthesia Plan  ASA: II  Anesthesia Plan: General   Post-op Pain Management:    Induction: Intravenous  Airway Management Planned: Oral ETT and Video Laryngoscope Planned  Additional Equipment:   Intra-op Plan:   Post-operative Plan: Extubation in OR  Informed Consent: I have reviewed the patients History and Physical, chart, labs and discussed the procedure including the risks, benefits and alternatives for the proposed anesthesia with the patient or authorized representative who has indicated his/her understanding and acceptance.   Dental advisory given  Plan Discussed with: CRNA  Anesthesia Plan Comments:        Anesthesia Quick Evaluation

## 2014-01-14 NOTE — Addendum Note (Signed)
Addendum created 01/14/14 1650 by Julian Reil, CRNA   Modules edited: Anesthesia Medication Administration

## 2014-01-14 NOTE — Progress Notes (Signed)
Utilization review completed.  

## 2014-01-14 NOTE — Progress Notes (Signed)
Orthopedic Tech Progress Note Patient Details:  Mallory Watts 02-Oct-1940 [id number]  Ortho Devices Type of Ortho Device: Soft collar Ortho Device/Splint Location: neck Ortho Device/Splint Interventions: Application   Yolunda Kloos 01/14/2014, 3:40 PM

## 2014-01-14 NOTE — Anesthesia Procedure Notes (Signed)
Procedure Name: Intubation Date/Time: 01/14/2014 12:37 PM Performed by: [place] Pink Pre-anesthesia Checklist: Patient identified, Emergency Drugs available, Suction available, Patient being monitored and Timeout performed Patient Re-evaluated:Patient Re-evaluated prior to inductionOxygen Delivery Method: Circle system utilized Preoxygenation: Pre-oxygenation with 100% oxygen Intubation Type: IV induction Ventilation: Two handed mask ventilation required and Oral airway inserted - appropriate to patient size Grade View: Grade II Tube type: Oral Tube size: 7.0 mm Number of attempts: 1 Airway Equipment and Method: Video-laryngoscopy,  Rigid stylet and Bite block Placement Confirmation: ETT inserted through vocal cords under direct vision,  positive ETCO2 and breath sounds checked- equal and bilateral Secured at: 21 cm Tube secured with: Tape Dental Injury: Teeth and Oropharynx as per pre-operative assessment

## 2014-01-15 MED ORDER — HYDROCODONE-ACETAMINOPHEN 7.5-325 MG PO TABS: 1.0000 | ORAL_TABLET | Freq: Four times a day (QID) | ORAL | Status: DC | PRN

## 2014-01-15 NOTE — Evaluation (Signed)
Occupational Therapy Evaluation Patient Details Name: Mallory Watts MRN: [id number] DOB: 11-08-1940 Today's Date: 01/15/2014    History of Present Illness 72 y.o. s/pANTERIOR CERVICAL DECOMPRESSION/DISCECTOMY FUSION CERVICAL FOUR-FIVE,CERVICAL FIVE SIX   Clinical Impression   Pt s/p above. Education provided during session. No further OT needs.     Follow Up Recommendations  No OT follow up    Equipment Recommendations  Tub/shower seat    Recommendations for Other Services       Precautions / Restrictions Precautions Precautions: Cervical Precaution Comments: educated on precautions. Required Braces or Orthoses: Cervical Brace Cervical Brace: Soft collar;For comfort Restrictions Weight Bearing Restrictions: No      Mobility Bed Mobility Overal bed mobility: Needs Assistance Bed Mobility: Rolling;Sidelying to Sit;Sit to Sidelying Rolling: Supervision Sidelying to sit: Supervision     Sit to sidelying: Supervision General bed mobility comments: cues for log roll technique.  Transfers Overall transfer level: Modified independent               General transfer comment: educated on technique.    Balance                                            ADL Overall ADL's : Needs assistance/impaired                 Upper Body Dressing : Supervision/safety;Standing   Lower Body Dressing: Supervision/safety;Sit to/from stand   Toilet Transfer: Ambulation;Modified Independent;Supervision/safety (bed)           Functional mobility during ADLs: Modified independent;Supervision/safety General ADL Comments: Educated on safety (rugs, pets, sitting for LB ADLs). Educated on techniques for ADLs to prevent neck motion. Educated on use of cup/straw.  Discussed cervical collar. Recommended signicant other being with her for shower transfer.     Vision                     Perception     Praxis      Pertinent Vitals/Pain Pain  Assessment: No/denies pain     Hand Dominance     Extremity/Trunk Assessment Upper Extremity Assessment Upper Extremity Assessment: Overall WFL for tasks assessed   Lower Extremity Assessment Lower Extremity Assessment: Overall WFL for tasks assessed       Communication Communication Communication: No difficulties   Cognition Arousal/Alertness: Awake/alert Behavior During Therapy: WFL for tasks assessed/performed Overall Cognitive Status: Within Functional Limits for tasks assessed                     General Comments       Exercises       Shoulder Instructions      Home Living Family/patient expects to be discharged to:: Private residence Living Arrangements: Spouse/significant other Available Help at Discharge: Friend(s);Available PRN/intermittently Type of Home: House Home Access: Level entry     Home Layout: Two level Alternate Level Stairs-Number of Steps: 10 and 4 Alternate Level Stairs-Rails: Left Bathroom Shower/Tub: Tub/shower unit;Walk-in shower   Bathroom Toilet: Standard (also has elevated toilet)                Prior Functioning/Environment Level of Independence: Independent             OT Diagnosis: Acute pain   OT Problem List:     OT Treatment/Interventions:      OT Goals(Current goals can be found in the  care plan section)    OT Frequency:     Barriers to D/C:            Co-evaluation              End of Session Equipment Utilized During Treatment: Cervical collar  Activity Tolerance: Patient tolerated treatment well Patient left: in bed;with call bell/phone within reach   Time: 0826-0849 OT Time Calculation (min): 23 min Charges:  OT General Charges $OT Visit: 1 Procedure OT Evaluation $Initial OT Evaluation Tier I: 1 Procedure OT Treatments $Self Care/Home Management : 8-22 mins G-Codes: OT G-codes **NOT FOR INPATIENT CLASS** Functional Assessment Tool Used: clinical judgment Functional  Limitation: Self care Self Care Current Status (P7106): At least 1 percent but less than 20 percent impaired, limited or restricted Self Care Goal Status (Y6948): At least 1 percent but less than 20 percent impaired, limited or restricted Self Care Discharge Status [phone]): At least 1 percent but less than 20 percent impaired, limited or restricted  Benito Mccreedy OTR/L [phone] 01/15/2014, 9:38 AM

## 2014-01-15 NOTE — Discharge Instructions (Signed)
Wound Care Leave incision open to air. You may shower. Do not scrub directly on incision.  Do not put any creams, lotions, or ointments on incision. Activity Walk each and every day, increasing distance each day. No lifting greater than 5 lbs.  Avoid excessive neck motion. No driving for 2 weeks; may ride as a passenger locally. Wear neck brace at all times except when showering.  If provided soft collar, may wear for comfort unless otherwise instructed. Diet Resume your normal diet.  Return to Work Will be discussed at you follow up appointment. Call Your Doctor If Any of These Occur Redness, drainage, or swelling at the wound.  Temperature greater than 101 degrees. Severe pain not relieved by pain medication. Increased difficulty swallowing. Incision starts to come apart. Follow Up Appt Call today for appointment in 4 weeks ([phone]) or for problems.  If you have any hardware placed in your spine, you will need an x-ray before your appointment.     Anterior and Posterior Decompressions with Fusions Decompression is a procedure performed to relieve symptoms caused by pressure or compression on the spinal cord and nerve roots. The spinal cord is a cord of nervous tissue. It extends from the brain and passes through the spinal canal (passage formed by the openings in the backbone). Nerves branch out from the spinal cord to different parts of the body. Vertebrae (backbones) are a group of individual bones. They form the spinal column. Each vertebra is made up of lamina (bony arches of the spinal canal), spine, and foramen (opening in the backbone). A disc is a soft, gel-like cushion between each vertebra.  Decompression can be carried out as an anterior or posterior procedure. Anterior decompression is where the surgeon makes an incision more toward the front of your body in order to get access to the area needing repair. Posterior decompression is where the surgeon makes an incision more  toward the back of your body in order to get access to the area needing repair. Depending upon the details of your specific condition, there are reasons why the surgeon may choose one approach over the other. CAUSES Compression of spinal cord and nerves can be caused by:  Bulging or collapse of the spinal disc.  Loosening of the ligaments.  Bony growth. The causes of the spinal cord compression include:  Degenerative diseases (such as with many arthritis problems).  Rupture of the disc.  Traumatic injury.  Spinal stenosis (narrowing of the spinal canal).  Pott disease (tuberculosis of the spine). LET YOUR CAREGIVER KNOW ABOUT:   Bleeding and clotting problems.  Allergy to anesthesia medications.  Allergy to medications like steroid and blood thinners.  Previous surgeries.  Bone diseases like disease of low bone mass (osteoporosis) and infection of bone (osteomyelitis).  Cancerous (neoplastic) conditions. RISKS AND COMPLICATIONS  Bleeding and collection of blood outside the blood vessels.  Damage to the blood vessels. This can cause heavy bleeding and even death.  Damage to the nerves. This can cause pain, loss of sensation, paralysis, and weakness.  Damage to the covering of the spinal cord (meninges). This can cause the cerebrospinal fluid to leak.  Complications involving graft and plate.  Inadequate fusion.  Wound infection. BEFORE THE PROCEDURE  Your caregiver will make sure that you have been given a reasonable trial of nonsurgical treatment methods. Your caregiver may perform an X-ray study with a dye (diskogram). The injection of a dye into a disc may produce a pain similar to your back pain.  This will help your caregiver to identify the disc that is the source of pain.  PROCEDURE Your surgeon will perform spinal decompression and fusion while you are under general anesthesia. There are different methods of performing decompression surgery. They include the  following:  Diskectomy. Your surgeon will remove a portion of a spinal disc that is causing pressure on the nerve roots.  Laminotomy or laminectomy. Lamina refers to bony arches of the spinal canal. Laminotomy is a procedure in which your surgeon removes a small portion of lamina. Laminectomy is the removal of an entire lamina.  Foraminotomy or foraminectomy. Foramen refers to the opening in the backbone for nerve roots. Foraminotomy is the removal of a small amount of bone and tissue forming the foramen. Foraminectomy is the removal of a large amount of bone and tissue.  Osteophyte removal. Your surgeon removes bony growths called osteophytes. These cause pressure on the spinal column.  Corpectomy. Corpus refers to the body of vertebra. This procedure involves the removal of the body of a vertebra and also the discs.  Spinal fusion is required if there is a curvature of the spine or forward slip of a vertebra. Spinal fusion is a procedure of fusing two or more vertebrae together with a bone graft (new bone or substitute material from your body). This is further strengthened by using screws, plate, and cage apparatus.  Fusion prevents motion between 2 vertebrae. It also prevents the curvature of the spine and slip of vertebra from getting worse after surgery. AFTER THE PROCEDURE   You will be moved to the recovery room and then to the hospital room.  You will have a thin tube (catheter) inserted into the urinary bladder to help in urination.  Your may have pain for the first few days after the surgery. Your caregiver will give you medications to control pain.  Your caregiver may order blood tests to monitor oxygen carrying protein of the red blood cells (hemoglobin). This would be due to blood loss during the surgery. Hemoglobin should be monitored to make sure that the level of blood oxygen does not become too low.  You will be given a back brace. A back brace limits motion and helps fusion  of the bone.  You may continue to have mild pain even after full recovery.  You may have a series X-ray examinations over time. This will ensure adequate healing and appropriate alignment at the site of operation. HOME CARE INSTRUCTIONS   To get strength and function, start physical therapy, occupational therapy, and exercises.  To ease the pain, you may have to exercise regularly. That also helps in weight loss.  To keep your spine in proper alignment, you should sit, stand, walk, turn in bed, and reposition yourself as instructed.  At first, take only short walks. Slowly increase other activities.  Avoid smoking. Nicotine inhibits fusion of the bone.  If narcotics (pain medication) are prescribed, you should not drink alcohol. You should not drive when you are on this medication because you will feel drowsy.  Avoid use of pain medication products and nonsteroidal anti-inflammatory agents (pain medication). They interfere with the development and growth of new bone cells.  Your caregiver may recommend using ice to manage pain. Ask your caregiver for instructions on how to do it.  Avoid lifting heavy objects.  Avoid bending and twisting motions. SEEK MEDICAL CARE IF:   You have increased pain.  There is expanding redness at the operative site.  You have a fever.  SEEK IMMEDIATE MEDICAL CARE IF:   You have any discomfort with the substitute material that has been used during the spinal fusion procedure.  You feel a collection of blood outside the blood vessels.  You notice any changes in the smell, appearance, or amount of drainage. Document Released: 04/17/2007 Document Revised: 08/12/2013 Document Reviewed: 08/18/2007 [facility] Patient Information 2015 [place], [place]. This information is not intended to replace advice given to you by your health care provider. Make sure you discuss any questions you have with your health care provider.

## 2014-01-15 NOTE — Progress Notes (Signed)
Pt given D/C instructions with Rx, verbal understanding was provided. Pt's IV was removed prior to D/C. Pt's incision was clean, dry, and had no sign of infection. Pt has soft collar for home use. Pt D/C'd home via wheelchair @ 1010 per MD order. Pt is stable @ D/C and has no other needs at this time. Holli Humbles, RN

## 2014-01-15 NOTE — Discharge Summary (Signed)
Physician Discharge Summary  Patient ID: Mallory Watts MRN: [id number] DOB/AGE: June 20, 1940 73 y.o.  Admit date: 01/14/2014 Discharge date: 01/15/2014  Admission Diagnoses: Cervical spondylosis with radiculopathy C4-5 and C5-6  Discharge Diagnoses: Cervical spondylosis with radiculopathy C4-5 and C5-6 Active Problems:   Cervical spondylosis with myelopathy and radiculopathy   Discharged Condition: good  Hospital Course: Patient underwent anterior cervical discectomy decompression and arthrodesis. She tolerated surgery well. Her motor function is intact. Incision and swelling is minimal. She is discharged home.  Consults: None  Significant Diagnostic Studies: None  Treatments:surgery: Anterior cervical decompression C4-5 C5-C6 arthrodesis with structural allograft anterior plate fixation.}  Discharge Exam: Blood pressure 159/80, pulse 81, temperature 98.9 F (37.2 C), temperature source Oral, resp. rate 16, height 5' 1.81" (1.57 m), weight 76.2 kg (167 lb 15.9 oz), last menstrual period 04/11/1994, SpO2 93.00%. Motor function is good in the upper extremities station and gait is intact.  Disposition: Discharge home  Discharge Instructions   Call MD for:  redness, tenderness, or signs of infection (pain, swelling, redness, odor or green/yellow discharge around incision site)    Complete by:  As directed      Call MD for:  severe uncontrolled pain    Complete by:  As directed      Call MD for:  temperature >100.4    Complete by:  As directed      Diet - low sodium heart healthy    Complete by:  As directed      Discharge instructions    Complete by:  As directed   Okay to shower. Do not apply salves or appointments to incision. No heavy lifting with the upper extremities greater than 15 pounds. May resume driving when not requiring pain medication and patient feels comfortable with doing so.     Increase activity slowly    Complete by:  As directed             Medication  List         aspirin 81 MG tablet  Take 81 mg by mouth daily.     atorvastatin 80 MG tablet  Commonly known as:  LIPITOR  Take 80 mg by mouth daily.     calcium carbonate 600 MG Tabs tablet  Commonly known as:  OS-CAL  Take 600 mg by mouth 2 (two) times daily with a meal.     cetirizine 10 MG tablet  Commonly known as:  ZYRTEC  Take 10 mg by mouth daily as needed for allergies.     diclofenac sodium 1 % Gel  Commonly known as:  VOLTAREN  Apply 2 g topically 2 (two) times daily as needed (for pain).     esomeprazole 40 MG capsule  Commonly known as:  NEXIUM  Take 40 mg by mouth daily at 12 noon.     eszopiclone 2 MG Tabs tablet  Commonly known as:  LUNESTA  Take 2 mg by mouth at bedtime as needed for sleep.     HYDROcodone-acetaminophen 5-325 MG per tablet  Commonly known as:  NORCO/VICODIN  Take 1 tablet by mouth every 6 (six) hours as needed for moderate pain.     HYDROcodone-acetaminophen 7.5-325 MG per tablet  Commonly known as:  NORCO  Take 1 tablet by mouth every 6 (six) hours as needed for moderate pain or severe pain.     HYDROcodone-acetaminophen 7.5-325 MG per tablet  Commonly known as:  NORCO  Take 1-2 tablets by mouth every 6 (six) hours as needed  for moderate pain or severe pain.     Magnesium 250 MG Tabs  Take 250 mg by mouth every other day.     meloxicam 15 MG tablet  Commonly known as:  MOBIC  Take 15 mg by mouth as needed for pain.     metoprolol succinate 50 MG 24 hr tablet  Commonly known as:  TOPROL-XL  Take 50 mg by mouth 2 (two) times daily.     mometasone 50 MCG/ACT nasal spray  Commonly known as:  NASONEX  Place 2 sprays into the nose daily as needed (congestion).     nitroGLYCERIN 0.4 MG SL tablet  Commonly known as:  NITROSTAT  Place 0.4 mg under the tongue every 5 (five) minutes as needed for chest pain.     NUVAIL Soln  Apply 1 application topically 2 (two) times daily. To toe nails     oxyCODONE-acetaminophen 5-325 MG per  tablet  Commonly known as:  PERCOCET/ROXICET  Take 1 tablet by mouth every 8 (eight) hours as needed for severe pain.     potassium chloride 10 MEQ CR capsule  Commonly known as:  MICRO-K  Take 10 mEq by mouth 2 (two) times daily.     solifenacin 5 MG tablet  Commonly known as:  VESICARE  Take 1 tablet (5 mg total) by mouth daily.     SYSTANE ULTRA 0.4-0.3 % Soln  Generic drug:  Polyethyl Glycol-Propyl Glycol  Apply 1 drop to eye as needed (dry eyes).     triamterene-hydrochlorothiazide 37.5-25 MG per capsule  Commonly known as:  DYAZIDE  Take 1 capsule by mouth daily.     valACYclovir 500 MG tablet  Commonly known as:  VALTREX  Take 500 mg by mouth every other day.     Vitamin D 400 UNITS capsule  Take 400 Units by mouth daily.         SignedEarleen Newport 01/15/2014, 9:23 AM

## 2014-01-16 ENCOUNTER — Encounter (HOSPITAL_COMMUNITY): Payer: Self-pay | Admitting: Neurological Surgery

## 2014-01-22 ENCOUNTER — Encounter: Payer: Self-pay | Admitting: Internal Medicine

## 2014-02-05 DIAGNOSIS — Z6828 Body mass index (BMI) 28.0-28.9, adult: Secondary | ICD-10-CM | POA: Diagnosis not present

## 2014-02-05 DIAGNOSIS — I10 Essential (primary) hypertension: Secondary | ICD-10-CM | POA: Diagnosis not present

## 2014-02-05 DIAGNOSIS — M5412 Radiculopathy, cervical region: Secondary | ICD-10-CM | POA: Diagnosis not present

## 2014-02-25 DIAGNOSIS — Z951 Presence of aortocoronary bypass graft: Secondary | ICD-10-CM | POA: Diagnosis not present

## 2014-02-25 DIAGNOSIS — I4892 Unspecified atrial flutter: Secondary | ICD-10-CM | POA: Diagnosis not present

## 2014-02-25 DIAGNOSIS — I119 Hypertensive heart disease without heart failure: Secondary | ICD-10-CM | POA: Diagnosis not present

## 2014-02-25 DIAGNOSIS — E785 Hyperlipidemia, unspecified: Secondary | ICD-10-CM | POA: Diagnosis not present

## 2014-02-25 DIAGNOSIS — I2510 Atherosclerotic heart disease of native coronary artery without angina pectoris: Secondary | ICD-10-CM | POA: Diagnosis not present

## 2014-03-11 DIAGNOSIS — Z0000 Encounter for general adult medical examination without abnormal findings: Secondary | ICD-10-CM | POA: Diagnosis not present

## 2014-03-11 DIAGNOSIS — E785 Hyperlipidemia, unspecified: Secondary | ICD-10-CM | POA: Diagnosis not present

## 2014-03-11 DIAGNOSIS — I2510 Atherosclerotic heart disease of native coronary artery without angina pectoris: Secondary | ICD-10-CM | POA: Diagnosis not present

## 2014-03-11 DIAGNOSIS — I10 Essential (primary) hypertension: Secondary | ICD-10-CM | POA: Diagnosis not present

## 2014-03-11 DIAGNOSIS — Z008 Encounter for other general examination: Secondary | ICD-10-CM | POA: Diagnosis not present

## 2014-03-11 DIAGNOSIS — M859 Disorder of bone density and structure, unspecified: Secondary | ICD-10-CM | POA: Diagnosis not present

## 2014-03-17 DIAGNOSIS — Z0000 Encounter for general adult medical examination without abnormal findings: Secondary | ICD-10-CM | POA: Diagnosis not present

## 2014-03-17 DIAGNOSIS — I2510 Atherosclerotic heart disease of native coronary artery without angina pectoris: Secondary | ICD-10-CM | POA: Diagnosis not present

## 2014-03-17 DIAGNOSIS — Z23 Encounter for immunization: Secondary | ICD-10-CM | POA: Diagnosis not present

## 2014-03-17 DIAGNOSIS — N3281 Overactive bladder: Secondary | ICD-10-CM | POA: Diagnosis not present

## 2014-03-17 DIAGNOSIS — Z6830 Body mass index (BMI) 30.0-30.9, adult: Secondary | ICD-10-CM | POA: Diagnosis not present

## 2014-03-17 DIAGNOSIS — I480 Paroxysmal atrial fibrillation: Secondary | ICD-10-CM | POA: Diagnosis not present

## 2014-03-17 DIAGNOSIS — Z1389 Encounter for screening for other disorder: Secondary | ICD-10-CM | POA: Diagnosis not present

## 2014-03-17 DIAGNOSIS — J309 Allergic rhinitis, unspecified: Secondary | ICD-10-CM | POA: Diagnosis not present

## 2014-03-17 DIAGNOSIS — M5030 Other cervical disc degeneration, unspecified cervical region: Secondary | ICD-10-CM | POA: Diagnosis not present

## 2014-03-19 DIAGNOSIS — Z1212 Encounter for screening for malignant neoplasm of rectum: Secondary | ICD-10-CM | POA: Diagnosis not present

## 2014-04-30 DIAGNOSIS — I10 Essential (primary) hypertension: Secondary | ICD-10-CM | POA: Diagnosis not present

## 2014-04-30 DIAGNOSIS — M5013 Cervical disc disorder with radiculopathy, cervicothoracic region: Secondary | ICD-10-CM | POA: Diagnosis not present

## 2014-04-30 DIAGNOSIS — Z6830 Body mass index (BMI) 30.0-30.9, adult: Secondary | ICD-10-CM | POA: Diagnosis not present

## 2014-05-05 DIAGNOSIS — M79609 Pain in unspecified limb: Secondary | ICD-10-CM | POA: Diagnosis not present

## 2014-05-16 DIAGNOSIS — M5013 Cervical disc disorder with radiculopathy, cervicothoracic region: Secondary | ICD-10-CM | POA: Diagnosis not present

## 2014-07-01 DIAGNOSIS — R350 Frequency of micturition: Secondary | ICD-10-CM | POA: Diagnosis not present

## 2014-07-01 DIAGNOSIS — R300 Dysuria: Secondary | ICD-10-CM | POA: Diagnosis not present

## 2014-07-07 ENCOUNTER — Ambulatory Visit: Payer: Medicare Other | Admitting: Obstetrics and Gynecology

## 2014-07-15 ENCOUNTER — Encounter: Payer: Self-pay | Admitting: Internal Medicine

## 2014-07-18 ENCOUNTER — Other Ambulatory Visit: Payer: Self-pay

## 2014-07-18 DIAGNOSIS — Z1231 Encounter for screening mammogram for malignant neoplasm of breast: Secondary | ICD-10-CM

## 2014-07-21 ENCOUNTER — Ambulatory Visit (INDEPENDENT_AMBULATORY_CARE_PROVIDER_SITE_OTHER): Payer: Medicare Other | Admitting: Certified Nurse Midwife

## 2014-07-21 ENCOUNTER — Telehealth: Payer: Self-pay | Admitting: Certified Nurse Midwife

## 2014-07-21 ENCOUNTER — Encounter: Payer: Self-pay | Admitting: Certified Nurse Midwife

## 2014-07-21 VITALS — BP 124/68 | HR 76 | Resp 20 | Ht 61.75 in | Wt 168.0 lb

## 2014-07-21 DIAGNOSIS — Z124 Encounter for screening for malignant neoplasm of cervix: Secondary | ICD-10-CM

## 2014-07-21 DIAGNOSIS — Z01419 Encounter for gynecological examination (general) (routine) without abnormal findings: Secondary | ICD-10-CM | POA: Diagnosis not present

## 2014-07-21 DIAGNOSIS — A609 Anogenital herpesviral infection, unspecified: Secondary | ICD-10-CM | POA: Diagnosis not present

## 2014-07-21 DIAGNOSIS — R3915 Urgency of urination: Secondary | ICD-10-CM | POA: Diagnosis not present

## 2014-07-21 DIAGNOSIS — A6009 Herpesviral infection of other urogenital tract: Secondary | ICD-10-CM

## 2014-07-21 DIAGNOSIS — Z853 Personal history of malignant neoplasm of breast: Secondary | ICD-10-CM | POA: Diagnosis not present

## 2014-07-21 MED ORDER — VALACYCLOVIR HCL 500 MG PO TABS: ORAL_TABLET | ORAL | Status: DC

## 2014-07-21 MED ORDER — SOLIFENACIN SUCCINATE 5 MG PO TABS: ORAL_TABLET | ORAL | Status: AC

## 2014-07-21 NOTE — Progress Notes (Signed)
74 y.o. G0P0 Single  Caucasian Fe here for annual exam. Menopausal no HRT. Denies vaginal bleeding.Treating self as recommended with Hyaologyn,. Also sees [facility] for urinary incontinence and frequency. See Dr Virgina Jock for aex/labs,medication management. Still having vaginal dryness with use of OTC product. Melvina working well for urgency, needs refill. Has had a few HSV 2 outbreaks, Valtrex working well to control. Not sexually active. Occasional ankle edema, but PCP is aware. No other health issues today.  Patient's last menstrual period was 04/11/1994.          Sexually active: No.  The current method of family planning is post menopausal status.    Exercising: No.  exercise Smoker:  no  Health Maintenance: Pap:  2012 neg MMG:  07-17-13 Category d density birads 2:neg Colonoscopy:  2011 f/u 85yrs BMD:   12/14 osteopenia with PCP management TDaP:  Up to date with pcp Labs: pcp Self breast exam: done occ   reports that she has never smoked. She does not have any smokeless tobacco history on file. She reports that she drinks about 0.6 - 1.2 oz of alcohol per week. She reports that she does not use illicit drugs.  Past Medical History  Diagnosis Date  . Cancer 1998    breast cancer--left  . Hypertension   . Osteopenia   . STD (sexually transmitted disease)     HSV  . Urinary incontinence   . Atrial fibrillation   . PONV (postoperative nausea and vomiting)   . Shortness of breath     exertion  . Dysrhythmia     atrial flutter/fib  . GERD (gastroesophageal reflux disease)   . Headache   . Arthritis     Past Surgical History  Procedure Laterality Date  . Breast surgery  1998    left lumpectomy for breast ca  . Heart bypass  2003    -single  . Coronary artery bypass graft  2003    at [place]  . Tonsillectomy    . Anterior cervical decomp/discectomy fusion N/A 01/14/2014    Procedure: ANTERIOR CERVICAL DECOMPRESSION/DISCECTOMY FUSION CERVICAL FOUR-FIVE,CERVICAL FIVE SIX;   Surgeon: [name], MD;  Location: [facility];  Service: Neurosurgery;  Laterality: N/A;    Current Outpatient Prescriptions  Medication Sig Dispense Refill  . aspirin 81 MG tablet Take 81 mg by mouth daily.    Marland Kitchen atorvastatin (LIPITOR) 80 MG tablet Take 80 mg by mouth daily.    . calcium carbonate (OS-CAL) 600 MG TABS tablet Take 600 mg by mouth 2 (two) times daily with a meal.    . cetirizine (ZYRTEC) 10 MG tablet Take 10 mg by mouth daily as needed for allergies.    . Cholecalciferol (VITAMIN D) 400 UNITS capsule Take 400 Units by mouth daily.    . Dermatological Products, Misc. (NUVAIL) SOLN Apply 1 application topically 2 (two) times daily. To toe nails    . diclofenac sodium (VOLTAREN) 1 % GEL Apply 2 g topically 2 (two) times daily as needed (for pain).    Marland Kitchen esomeprazole (NEXIUM) 40 MG capsule Take 40 mg by mouth daily.     . eszopiclone (LUNESTA) 2 MG TABS tablet Take 2 mg by mouth at bedtime as needed for sleep.     Marland Kitchen HYDROcodone-acetaminophen (NORCO) 7.5-325 MG per tablet Take 1 tablet by mouth every 6 (six) hours as needed for moderate pain or severe pain.    . Magnesium 250 MG TABS Take 250 mg by mouth every other day.    Marland Kitchen  meloxicam (MOBIC) 15 MG tablet Take 15 mg by mouth as needed for pain.     . metoprolol succinate (TOPROL-XL) 50 MG 24 hr tablet Take 50 mg by mouth 2 (two) times daily.    . mometasone (NASONEX) 50 MCG/ACT nasal spray Place 2 sprays into the nose daily as needed (congestion).     . potassium chloride (MICRO-K) 10 MEQ CR capsule Take 10 mEq by mouth 2 (two) times daily.    . solifenacin (VESICARE) 5 MG tablet Take 1 tablet (5 mg total) by mouth daily. 90 tablet 3  . triamterene-hydrochlorothiazide (DYAZIDE) 37.5-25 MG per capsule Take 1 capsule by mouth daily.    . valACYclovir (VALTREX) 500 MG tablet Take 500 mg by mouth every other day.    Marland Kitchen ZETIA 10 MG tablet Take 10 mg by mouth daily.  12  . nitroGLYCERIN (NITROSTAT) 0.4 MG SL tablet Place 0.4 mg under  the tongue every 5 (five) minutes as needed for chest pain.     No current facility-administered medications for this visit.    Family History  Problem Relation Age of Onset  . Diabetes Mother     AODM  . Stroke Mother   . Diabetes Brother   . Stroke Father     ROS:  Pertinent items are noted in HPI.  Otherwise, a comprehensive ROS was negative.  Exam:   BP 124/68 mmHg  Pulse 76  Resp 20  Ht 5' 1.75" (1.568 m)  Wt 168 lb (76.204 kg)  BMI 30.99 kg/m2  LMP 04/11/1994 Height: 5' 1.75" (156.8 cm) Ht Readings from Last 3 Encounters:  07/21/14 5' 1.75" (1.568 m)  01/14/14 5' 1.81" (1.57 m)  07/03/13 5' 1.75" (1.568 m)    General appearance: alert, cooperative and appears stated age Head: Normocephalic, without obvious abnormality, atraumatic Neck: no adenopathy, supple, symmetrical, trachea midline and thyroid normal to inspection and palpation Lungs: clear to auscultation bilaterally Breasts: normal appearance, no masses or tenderness, No nipple retraction or dimpling, No nipple discharge or bleeding, No axillary or supraclavicular adenopathy Heart: regular rate and rhythm Abdomen: soft, non-tender; no masses,  no organomegaly Extremities: extremities normal, atraumatic, no cyanosis or edema Skin: Skin color, texture, turgor normal. No rashes or lesions Lymph nodes: Cervical, supraclavicular, and axillary nodes normal. No abnormal inguinal nodes palpated Neurologic: Grossly normal   Pelvic: External genitalia:  no lesions              Urethra:  normal appearing urethra with no masses, tenderness or lesions              Bartholin's and Skene's: normal                 Vagina: normal appearing vagina with normal color and discharge, no lesions, introitus appears dry and increased pink noted in color. Patient admits this is the point of dryness she feels.              Cervix: normal, non tender, no lesions              Pap taken: Yes.   Bimanual Exam:  Uterus:  normal size,  contour, position, consistency, mobility, non-tender              Adnexa: normal adnexa and no mass, fullness, tenderness               Rectovaginal: Confirms               Anus:  normal sphincter tone,  no lesions  Chaperone present: Yes  A:  Well Woman with normal exam  Menopausal no HRT  Atrophic vaginitis with OTC Hyalogyn use  HSV 2 uses Valtrex for suppression needs refill  Vesicare working well for urinary urgency needs refill  Personal history of breast cancer   [facility] for management of other urinary issues.  Cholesterol/ fluid retention management with PCP  P:   Reviewed health and wellness pertinent to exam  Aware of need to evaluate if vaginal bleeding  Discussed using coconut oil at entrance to vagina(patient shown area with mirror) daily to see if this resolves the dryness she feels. Patient agreeable and will advise if problems.  Rx Valtrex see order  Rx Vesicare see order, discussed needs to advise if has issues with hypertension when sees PCP, may need to evaluate medication use  Continue MD follow up as indciated  Pap smear taken today   counseled on breast self exam, mammography screening, menopause, adequate intake of calcium and vitamin D, diet and exercise, Kegel's exercises  return annually or prn  An After Visit Summary was printed and given to the patient.

## 2014-07-21 NOTE — Telephone Encounter (Signed)
Patient advised recommended annual pelvic exam and breast check with GYN. Patient states that medicare does not pay for annual exam. Advised can see PCP or come our office but pelvic and breast exam recommended yearly. Patient agreeable. She states she will call back to schedule annual exam.Routing to provider for final review. Patient agreeable to disposition. Will close encounter

## 2014-07-21 NOTE — Patient Instructions (Addendum)
Atrophic Vaginitis Atrophic vaginitis is a problem of low levels of estrogen in women. This problem can happen at any 74. It is most common in women who have gone through menopause ("the change").  HOW WILL I KNOW IF I HAVE THIS PROBLEM? You may have:  Trouble with peeing (urinating), such as:  Going to the bathroom often.  A hard time holding your pee until you reach a bathroom.  Leaking pee.  Having pain when you pee.  Itching or a burning feeling.  Vaginal bleeding and spotting.  Pain during sex.  Dryness of the vagina.  A yellow, bad-smelling fluid (discharge) coming from the vagina. HOW WILL MY DOCTOR CHECK FOR THIS PROBLEM?  During your exam, your doctor will likely find the problem.  If there is a vaginal fluid, it may be checked for infection. HOW WILL THIS PROBLEM BE TREATED? Keep the vulvar skin as clean as possible. Moisturizers and lubricants can help with some of the symptoms. Estrogen replacement can help. There are 2 ways to take estrogen:  Systemic estrogen gets estrogen to your whole body. It takes many weeks or months before the symptoms get better.  You take an estrogen pill.  You use a skin patch. This is a patch that you put on your skin.  If you still have your uterus, your doctor may ask you to take a hormone. Talk to your doctor about the right medicine for you.  Estrogen cream.  This puts estrogen only at the part of your body where you apply it. The cream is put into the vagina or put on the vulvar skin. For some women, estrogen cream works faster than pills or the patch. CAN ALL WOMEN WITH THIS PROBLEM USE ESTROGEN? No. Women with certain types of cancer, liver problems, or problems with blood clots should not take estrogen. Your doctor can help you decide the best treatment for your symptoms. Document Released: 09/14/2007 Document Revised: 04/02/2013 Document Reviewed: 09/14/2007 [facility] Patient Information 2015 [place], [place]. This  information is not intended to replace advice given to you by your health care provider. Make sure you discuss any questions you have with your health care provider.  EXERCISE AND DIET:  We recommended that you start or continue a regular exercise program for good health. Regular exercise means any activity that makes your heart beat faster and makes you sweat.  We recommend exercising at least 30 minutes per day at least 3 days a week, preferably 4 or 5.  We also recommend a diet low in fat and sugar.  Inactivity, poor dietary choices and obesity can cause diabetes, heart attack, stroke, and kidney damage, among others.    ALCOHOL AND SMOKING:  Women should limit their alcohol intake to no more than 7 drinks/beers/glasses of wine (combined, not each!) per week. Moderation of alcohol intake to this level decreases your risk of breast cancer and liver damage. And of course, no recreational drugs are part of a healthy lifestyle.  And absolutely no smoking or even second hand smoke. Most people know smoking can cause heart and lung diseases, but did you know it also contributes to weakening of your bones? Aging of your skin?  Yellowing of your teeth and nails?  CALCIUM AND VITAMIN D:  Adequate intake of calcium and Vitamin D are recommended.  The recommendations for exact amounts of these supplements seem to change often, but generally speaking 600 mg of calcium (either carbonate or citrate) and 800 units of Vitamin D per day seems  prudent. Certain women may benefit from higher intake of Vitamin D.  If you are among these women, your doctor will have told you during your visit.    PAP SMEARS:  Pap smears, to check for cervical cancer or precancers,  have traditionally been done yearly, although recent scientific advances have shown that most women can have pap smears less often.  However, every woman still should have a physical exam from her gynecologist every year. It will include a breast check, inspection  of the vulva and vagina to check for abnormal growths or skin changes, a visual exam of the cervix, and then an exam to evaluate the size and shape of the uterus and ovaries.  And after 72 years of age, a rectal exam is indicated to check for rectal cancers. We will also provide age appropriate advice regarding health maintenance, like when you should have certain vaccines, screening for sexually transmitted diseases, bone density testing, colonoscopy, mammograms, etc.   MAMMOGRAMS:  All women over 72 years old should have a yearly mammogram. Many facilities now offer a "3D" mammogram, which may cost around $50 extra out of pocket. If possible,  we recommend you accept the option to have the 3D mammogram performed.  It both reduces the number of women who will be called back for extra views which then turn out to be normal, and it is better than the routine mammogram at detecting truly abnormal areas.    COLONOSCOPY:  Colonoscopy to screen for colon cancer is recommended for all women at age 72.  We know, you hate the idea of the prep.  We agree, BUT, having colon cancer and not knowing it is worse!!  Colon cancer so often starts as a polyp that can be seen and removed at colonscopy, which can quite literally save your life!  And if your first colonoscopy is normal and you have no family history of colon cancer, most women don't have to have it again for 10 years.  Once every ten years, you can do something that may end up saving your life, right?  We will be happy to help you get it scheduled when you are ready.  Be sure to check your insurance coverage so you understand how much it will cost.  It may be covered as a preventative service at no cost, but you should check your particular policy.

## 2014-07-21 NOTE — Telephone Encounter (Signed)
Patient is asking if she needs to return for had aex every year or every two years? Patient was seen today.

## 2014-07-23 LAB — IPS PAP SMEAR ONLY

## 2014-07-27 NOTE — Progress Notes (Signed)
Reviewed personally.  M. Suzanne Adith Tejada, MD.  

## 2014-07-29 ENCOUNTER — Ambulatory Visit
Admission: RE | Admit: 2014-07-29 | Discharge: 2014-07-29 | Disposition: A | Discharge status: Home / Self Care | Payer: Medicare Other | Source: Ambulatory Visit

## 2014-07-29 DIAGNOSIS — Z1231 Encounter for screening mammogram for malignant neoplasm of breast: Secondary | ICD-10-CM | POA: Diagnosis not present

## 2014-10-20 DIAGNOSIS — I2510 Atherosclerotic heart disease of native coronary artery without angina pectoris: Secondary | ICD-10-CM | POA: Diagnosis not present

## 2014-10-20 DIAGNOSIS — E785 Hyperlipidemia, unspecified: Secondary | ICD-10-CM | POA: Diagnosis not present

## 2014-10-20 DIAGNOSIS — Z1389 Encounter for screening for other disorder: Secondary | ICD-10-CM | POA: Diagnosis not present

## 2014-10-20 DIAGNOSIS — I480 Paroxysmal atrial fibrillation: Secondary | ICD-10-CM | POA: Diagnosis not present

## 2014-10-20 DIAGNOSIS — Z6830 Body mass index (BMI) 30.0-30.9, adult: Secondary | ICD-10-CM | POA: Diagnosis not present

## 2014-10-20 DIAGNOSIS — N3281 Overactive bladder: Secondary | ICD-10-CM | POA: Diagnosis not present

## 2014-10-20 DIAGNOSIS — I10 Essential (primary) hypertension: Secondary | ICD-10-CM | POA: Diagnosis not present

## 2014-10-20 DIAGNOSIS — I119 Hypertensive heart disease without heart failure: Secondary | ICD-10-CM | POA: Diagnosis not present

## 2014-10-20 DIAGNOSIS — R600 Localized edema: Secondary | ICD-10-CM | POA: Diagnosis not present

## 2014-10-20 DIAGNOSIS — H9311 Tinnitus, right ear: Secondary | ICD-10-CM | POA: Diagnosis not present

## 2014-10-20 DIAGNOSIS — H6121 Impacted cerumen, right ear: Secondary | ICD-10-CM | POA: Diagnosis not present

## 2014-10-30 DIAGNOSIS — M25562 Pain in left knee: Secondary | ICD-10-CM | POA: Diagnosis not present

## 2014-11-05 DIAGNOSIS — M23302 Other meniscus derangements, unspecified lateral meniscus, unspecified knee: Secondary | ICD-10-CM | POA: Diagnosis not present

## 2014-11-17 DIAGNOSIS — M1712 Unilateral primary osteoarthritis, left knee: Secondary | ICD-10-CM | POA: Diagnosis not present

## 2014-11-22 DIAGNOSIS — M25562 Pain in left knee: Secondary | ICD-10-CM | POA: Diagnosis not present

## 2014-11-24 DIAGNOSIS — S83242D Other tear of medial meniscus, current injury, left knee, subsequent encounter: Secondary | ICD-10-CM | POA: Diagnosis not present

## 2014-11-24 DIAGNOSIS — M84369D Stress fracture, unspecified tibia and fibula, subsequent encounter for fracture with routine healing: Secondary | ICD-10-CM | POA: Diagnosis not present

## 2014-12-01 DIAGNOSIS — M1712 Unilateral primary osteoarthritis, left knee: Secondary | ICD-10-CM | POA: Diagnosis not present

## 2014-12-08 DIAGNOSIS — M1712 Unilateral primary osteoarthritis, left knee: Secondary | ICD-10-CM | POA: Diagnosis not present

## 2014-12-22 DIAGNOSIS — M1712 Unilateral primary osteoarthritis, left knee: Secondary | ICD-10-CM | POA: Diagnosis not present

## 2014-12-29 DIAGNOSIS — M1712 Unilateral primary osteoarthritis, left knee: Secondary | ICD-10-CM | POA: Diagnosis not present

## 2015-01-20 DIAGNOSIS — Z961 Presence of intraocular lens: Secondary | ICD-10-CM | POA: Diagnosis not present

## 2015-01-20 DIAGNOSIS — H43813 Vitreous degeneration, bilateral: Secondary | ICD-10-CM | POA: Diagnosis not present

## 2015-01-20 DIAGNOSIS — H35371 Puckering of macula, right eye: Secondary | ICD-10-CM | POA: Diagnosis not present

## 2015-01-20 DIAGNOSIS — H524 Presbyopia: Secondary | ICD-10-CM | POA: Diagnosis not present

## 2015-01-20 DIAGNOSIS — H26493 Other secondary cataract, bilateral: Secondary | ICD-10-CM | POA: Diagnosis not present

## 2015-01-26 DIAGNOSIS — M84361D Stress fracture, right tibia, subsequent encounter for fracture with routine healing: Secondary | ICD-10-CM | POA: Diagnosis not present

## 2015-01-26 DIAGNOSIS — M7050 Other bursitis of knee, unspecified knee: Secondary | ICD-10-CM | POA: Diagnosis not present

## 2015-01-26 DIAGNOSIS — M1712 Unilateral primary osteoarthritis, left knee: Secondary | ICD-10-CM | POA: Diagnosis not present

## 2015-02-23 ENCOUNTER — Encounter: Payer: Self-pay | Admitting: Internal Medicine

## 2015-03-02 DIAGNOSIS — I119 Hypertensive heart disease without heart failure: Secondary | ICD-10-CM | POA: Diagnosis not present

## 2015-03-02 DIAGNOSIS — I483 Typical atrial flutter: Secondary | ICD-10-CM | POA: Diagnosis not present

## 2015-03-02 DIAGNOSIS — I2510 Atherosclerotic heart disease of native coronary artery without angina pectoris: Secondary | ICD-10-CM | POA: Diagnosis not present

## 2015-03-02 DIAGNOSIS — E785 Hyperlipidemia, unspecified: Secondary | ICD-10-CM | POA: Diagnosis not present

## 2015-03-02 DIAGNOSIS — Z951 Presence of aortocoronary bypass graft: Secondary | ICD-10-CM | POA: Diagnosis not present

## 2015-03-20 DIAGNOSIS — I10 Essential (primary) hypertension: Secondary | ICD-10-CM | POA: Diagnosis not present

## 2015-03-20 DIAGNOSIS — I2510 Atherosclerotic heart disease of native coronary artery without angina pectoris: Secondary | ICD-10-CM | POA: Diagnosis not present

## 2015-03-20 DIAGNOSIS — E784 Other hyperlipidemia: Secondary | ICD-10-CM | POA: Diagnosis not present

## 2015-03-20 DIAGNOSIS — M859 Disorder of bone density and structure, unspecified: Secondary | ICD-10-CM | POA: Diagnosis not present

## 2015-03-27 DIAGNOSIS — N3281 Overactive bladder: Secondary | ICD-10-CM | POA: Diagnosis not present

## 2015-03-27 DIAGNOSIS — N183 Chronic kidney disease, stage 3 (moderate): Secondary | ICD-10-CM | POA: Diagnosis not present

## 2015-03-27 DIAGNOSIS — M1990 Unspecified osteoarthritis, unspecified site: Secondary | ICD-10-CM | POA: Diagnosis not present

## 2015-03-27 DIAGNOSIS — I119 Hypertensive heart disease without heart failure: Secondary | ICD-10-CM | POA: Diagnosis not present

## 2015-03-27 DIAGNOSIS — Z6832 Body mass index (BMI) 32.0-32.9, adult: Secondary | ICD-10-CM | POA: Diagnosis not present

## 2015-03-27 DIAGNOSIS — Z0000 Encounter for general adult medical examination without abnormal findings: Secondary | ICD-10-CM | POA: Diagnosis not present

## 2015-03-27 DIAGNOSIS — I2510 Atherosclerotic heart disease of native coronary artery without angina pectoris: Secondary | ICD-10-CM | POA: Diagnosis not present

## 2015-03-27 DIAGNOSIS — R600 Localized edema: Secondary | ICD-10-CM | POA: Diagnosis not present

## 2015-03-27 DIAGNOSIS — Z853 Personal history of malignant neoplasm of breast: Secondary | ICD-10-CM | POA: Diagnosis not present

## 2015-03-27 DIAGNOSIS — E784 Other hyperlipidemia: Secondary | ICD-10-CM | POA: Diagnosis not present

## 2015-04-16 ENCOUNTER — Encounter: Payer: Self-pay | Admitting: Internal Medicine

## 2015-04-17 ENCOUNTER — Ambulatory Visit (AMBULATORY_SURGERY_CENTER): Payer: Self-pay | Admitting: *Deleted

## 2015-04-17 VITALS — Ht 62.5 in | Wt 171.0 lb

## 2015-04-17 DIAGNOSIS — Z86010 Personal history of colonic polyps: Secondary | ICD-10-CM

## 2015-04-17 MED ORDER — NA SULFATE-K SULFATE-MG SULF 17.5-3.13-1.6 GM/177ML PO SOLN: 1.0000 | Freq: Once | ORAL | Status: DC

## 2015-04-17 NOTE — Progress Notes (Signed)
No egg or soy allergy known to patient  No issues with past sedation with any surgeries  or procedures, no intubation problems -py has hx of post op n/v with contrast for mri  No diet pills No home 02 use per patient   emmi video declined

## 2015-05-01 ENCOUNTER — Ambulatory Visit (AMBULATORY_SURGERY_CENTER): Payer: Medicare Other | Admitting: Internal Medicine

## 2015-05-01 ENCOUNTER — Encounter: Payer: Self-pay | Admitting: Internal Medicine

## 2015-05-01 VITALS — BP 125/58 | HR 73 | Temp 96.1°F | Resp 16 | Ht 62.5 in | Wt 171.0 lb

## 2015-05-01 DIAGNOSIS — D123 Benign neoplasm of transverse colon: Secondary | ICD-10-CM

## 2015-05-01 DIAGNOSIS — I10 Essential (primary) hypertension: Secondary | ICD-10-CM | POA: Diagnosis not present

## 2015-05-01 DIAGNOSIS — Z86010 Personal history of colonic polyps: Secondary | ICD-10-CM | POA: Diagnosis not present

## 2015-05-01 DIAGNOSIS — Z1211 Encounter for screening for malignant neoplasm of colon: Secondary | ICD-10-CM | POA: Diagnosis not present

## 2015-05-01 DIAGNOSIS — I4891 Unspecified atrial fibrillation: Secondary | ICD-10-CM | POA: Diagnosis not present

## 2015-05-01 MED ORDER — SODIUM CHLORIDE 0.9 % IV SOLN: 500.0000 mL | INTRAVENOUS | Status: DC

## 2015-05-01 NOTE — Op Note (Signed)
[facility]  [address] [place], [id number]   COLONOSCOPY PROCEDURE REPORT  PATIENT: Mallory Watts, Mallory Watts  MR#: [id number] BIRTHDATE: [birth year] , 74  yrs. old GENDER: female ENDOSCOPIST: Eustace Quail, MD REFERRED [id number] Program Recall PROCEDURE DATE:  05/01/2015 PROCEDURE:   Colonoscopy, surveillance and Colonoscopy with snare polypectomy x 1 First Screening Colonoscopy - Avg.  risk and is 50 yrs.  old or older - No.  Prior Negative Screening - Now for repeat screening. N/A  History of Adenoma - Now for follow-up colonoscopy & has been > or = to 3 yrs.  Yes hx of adenoma.  Has been 3 or more years since last colonoscopy.  Polyps removed today? Yes ASA CLASS:   Class II INDICATIONS:Surveillance due to prior colonic neoplasia and PH Colon Adenoma.. Index exam 2007 with small tubular adenomas; 2010 was negative MEDICATIONS: Monitored anesthesia care and Propofol 350 mg IV  DESCRIPTION OF PROCEDURE:   After the risks benefits and alternatives of the procedure were thoroughly explained, informed consent was obtained.  The digital rectal exam revealed no abnormalities of the rectum.   The LB [id number]  endoscope was introduced through the anus and advanced to the cecum, which was identified by both the appendix and ileocecal valve. No adverse events experienced.   The quality of the prep was excellent. (Suprep was used)  The instrument was then slowly withdrawn as the colon was fully examined. Estimated blood loss is zero unless otherwise noted in this procedure report.   COLON FINDINGS: A single polyp measuring 1 mm in size was found in the transverse colon.  A polypectomy was performed with a cold snare.  The resection was complete, the polyp tissue was completely retrieved and sent to histology.   The examination was otherwise normal.  Retroflexed views revealed internal hemorrhoids. The time to cecum = 2.0 Withdrawal time = 11.9   The scope  was withdrawn and the procedure completed. COMPLICATIONS: There were no immediate complications.  ENDOSCOPIC IMPRESSION: 1.   Single polyp was found in the transverse colon; polypectomy was performed with a cold snare 2.   The examination was otherwise normal  RECOMMENDATIONS: 1. Return to the care of your primary provider.  GI follow up as needed  eSigned:  Eustace Quail, MD 05/01/2015 12:36 PM   cc: The Patient and [name], MD

## 2015-05-01 NOTE — Progress Notes (Signed)
Called to room to assist during endoscopic procedure.  Patient ID and intended procedure confirmed with present staff. Received instructions for my participation in the procedure from the performing physician.  

## 2015-05-01 NOTE — Progress Notes (Signed)
PATIENT WITH HACKY, DEEP COUGH. PATIENT STATING SHE JUST FINISHED AUGMENTIN FOR BRONCHITIS ON 1/18.

## 2015-05-01 NOTE — Progress Notes (Signed)
Patient awakening,vss,report to rn 

## 2015-05-01 NOTE — Patient Instructions (Signed)
HANDOUT: POLYPS  YOU HAD AN ENDOSCOPIC PROCEDURE TODAY AT [facility]:   Refer to the procedure report that was given to you for any specific questions about what was found during the examination.  If the procedure report does not answer your questions, please call your gastroenterologist to clarify.  If you requested that your care partner not be given the details of your procedure findings, then the procedure report has been included in a sealed envelope for you to review at your convenience later.  YOU SHOULD EXPECT: Some feelings of bloating in the abdomen. Passage of more gas than usual.  Walking can help get rid of the air that was put into your GI tract during the procedure and reduce the bloating. If you had a lower endoscopy (such as a colonoscopy or flexible sigmoidoscopy) you may notice spotting of blood in your stool or on the toilet paper. If you underwent a bowel prep for your procedure, you may not have a normal bowel movement for a few days.  Please Note:  You might notice some irritation and congestion in your nose or some drainage.  This is from the oxygen used during your procedure.  There is no need for concern and it should clear up in a day or so.  SYMPTOMS TO REPORT IMMEDIATELY:   Following lower endoscopy (colonoscopy or flexible sigmoidoscopy):  Excessive amounts of blood in the stool  Significant tenderness or worsening of abdominal pains  Swelling of the abdomen that is new, acute  Fever of 100F or higher  For urgent or emergent issues, a gastroenterologist can be reached at any hour by calling [phone].   DIET: Your first meal following the procedure should be a small meal and then it is ok to progress to your normal diet. Heavy or fried foods are harder to digest and may make you feel nauseous or bloated.  Likewise, meals heavy in dairy and vegetables can increase bloating.  Drink plenty of fluids but you should avoid alcoholic beverages for 24  hours.  ACTIVITY:  You should plan to take it easy for the rest of today and you should NOT DRIVE or use heavy machinery until tomorrow (because of the sedation medicines used during the test).    FOLLOW UP: Our staff will call the number listed on your records the next business day following your procedure to check on you and address any questions or concerns that you may have regarding the information given to you following your procedure. If we do not reach you, we will leave a message.  However, if you are feeling well and you are not experiencing any problems, there is no need to return our call.  We will assume that you have returned to your regular daily activities without incident.  If any biopsies were taken you will be contacted by phone or by letter within the next 1-3 weeks.  Please call us at [phone] if you have not heard about the biopsies in 3 weeks.    SIGNATURES/CONFIDENTIALITY: You and/or your care partner have signed paperwork which will be entered into your electronic medical record.  These signatures attest to the fact that that the information above on your After Visit Summary has been reviewed and is understood.  Full responsibility of the confidentiality of this discharge information lies with you and/or your care-partner.

## 2015-05-04 ENCOUNTER — Telehealth: Payer: Self-pay

## 2015-05-04 NOTE — Telephone Encounter (Signed)
  Follow up Call-  Call back number 05/01/2015  Post procedure Call Back phone  # [phone]  Permission to leave phone message Yes     Patient questions:  Do you have a fever, pain , or abdominal swelling? No. Pain Score  0 *  Have you tolerated food without any problems? Yes.    Have you been able to return to your normal activities? Yes.    Do you have any questions about your discharge instructions: Diet   No. Medications  No. Follow up visit  No.  Do you have questions or concerns about your Care? No.  Actions: * If pain score is 4 or above: No action needed, pain <4.

## 2015-05-05 ENCOUNTER — Encounter: Payer: Self-pay | Admitting: Internal Medicine

## 2015-05-28 DIAGNOSIS — H6121 Impacted cerumen, right ear: Secondary | ICD-10-CM | POA: Diagnosis not present

## 2015-05-28 DIAGNOSIS — R42 Dizziness and giddiness: Secondary | ICD-10-CM | POA: Diagnosis not present

## 2015-05-28 DIAGNOSIS — Z6831 Body mass index (BMI) 31.0-31.9, adult: Secondary | ICD-10-CM | POA: Diagnosis not present

## 2015-05-28 DIAGNOSIS — H9311 Tinnitus, right ear: Secondary | ICD-10-CM | POA: Diagnosis not present

## 2015-07-22 ENCOUNTER — Other Ambulatory Visit: Payer: Self-pay

## 2015-07-22 DIAGNOSIS — Z1231 Encounter for screening mammogram for malignant neoplasm of breast: Secondary | ICD-10-CM

## 2015-07-30 DIAGNOSIS — M1712 Unilateral primary osteoarthritis, left knee: Secondary | ICD-10-CM | POA: Diagnosis not present

## 2015-08-04 ENCOUNTER — Ambulatory Visit
Admission: RE | Admit: 2015-08-04 | Discharge: 2015-08-04 | Disposition: A | Discharge status: Home / Self Care | Payer: Medicare Other | Source: Ambulatory Visit

## 2015-08-04 DIAGNOSIS — Z1231 Encounter for screening mammogram for malignant neoplasm of breast: Secondary | ICD-10-CM

## 2015-09-19 ENCOUNTER — Other Ambulatory Visit: Payer: Self-pay | Admitting: Certified Nurse Midwife

## 2015-09-21 NOTE — Telephone Encounter (Signed)
Medication refill request: Vesicare 5MG  Last AEX:  07/21/14 DL Next AEX: none scheduled Last MMG (if hormonal medication request): 08/04/15 [id number] negative Refill authorized: 07/21/14 #90 w/3 refills; today pleae Mallory Watts

## 2015-09-22 DIAGNOSIS — S90851A Superficial foreign body, right foot, initial encounter: Secondary | ICD-10-CM | POA: Diagnosis not present

## 2015-09-22 DIAGNOSIS — D2371 Other benign neoplasm of skin of right lower limb, including hip: Secondary | ICD-10-CM | POA: Diagnosis not present

## 2015-09-22 DIAGNOSIS — B351 Tinea unguium: Secondary | ICD-10-CM | POA: Diagnosis not present

## 2015-09-22 NOTE — Telephone Encounter (Signed)
She will need to have her Vesicare refilled by her PCP

## 2015-09-22 NOTE — Telephone Encounter (Signed)
Spoke with patient, she states that she will no longer be coming in for an AEX. Per patient "I was under the impression from the PA that I no longer needed an annual exam." After explaining to patient that the provider would like her scheduled for AEX, patient's reply was "no, I won't be coming back, it costs too much." Routing to provider for final review and closing encounter.

## 2015-09-22 NOTE — Telephone Encounter (Signed)
Needs phone call has not scheduled Aex

## 2015-09-28 DIAGNOSIS — Z6831 Body mass index (BMI) 31.0-31.9, adult: Secondary | ICD-10-CM | POA: Diagnosis not present

## 2015-09-28 DIAGNOSIS — Z853 Personal history of malignant neoplasm of breast: Secondary | ICD-10-CM | POA: Diagnosis not present

## 2015-09-28 DIAGNOSIS — I480 Paroxysmal atrial fibrillation: Secondary | ICD-10-CM | POA: Diagnosis not present

## 2015-09-28 DIAGNOSIS — R739 Hyperglycemia, unspecified: Secondary | ICD-10-CM | POA: Diagnosis not present

## 2015-09-28 DIAGNOSIS — M1990 Unspecified osteoarthritis, unspecified site: Secondary | ICD-10-CM | POA: Diagnosis not present

## 2015-09-28 DIAGNOSIS — N183 Chronic kidney disease, stage 3 (moderate): Secondary | ICD-10-CM | POA: Diagnosis not present

## 2015-09-28 DIAGNOSIS — N3281 Overactive bladder: Secondary | ICD-10-CM | POA: Diagnosis not present

## 2015-09-28 DIAGNOSIS — E668 Other obesity: Secondary | ICD-10-CM | POA: Diagnosis not present

## 2015-09-28 DIAGNOSIS — I119 Hypertensive heart disease without heart failure: Secondary | ICD-10-CM | POA: Diagnosis not present

## 2015-09-28 DIAGNOSIS — I2510 Atherosclerotic heart disease of native coronary artery without angina pectoris: Secondary | ICD-10-CM | POA: Diagnosis not present

## 2015-09-28 DIAGNOSIS — Z1389 Encounter for screening for other disorder: Secondary | ICD-10-CM | POA: Diagnosis not present

## 2015-09-28 DIAGNOSIS — E784 Other hyperlipidemia: Secondary | ICD-10-CM | POA: Diagnosis not present

## 2016-01-26 DIAGNOSIS — H04123 Dry eye syndrome of bilateral lacrimal glands: Secondary | ICD-10-CM | POA: Diagnosis not present

## 2016-01-26 DIAGNOSIS — D2311 Other benign neoplasm of skin of right eyelid, including canthus: Secondary | ICD-10-CM | POA: Diagnosis not present

## 2016-01-26 DIAGNOSIS — Z961 Presence of intraocular lens: Secondary | ICD-10-CM | POA: Diagnosis not present

## 2016-01-26 DIAGNOSIS — H35371 Puckering of macula, right eye: Secondary | ICD-10-CM | POA: Diagnosis not present

## 2016-02-16 DIAGNOSIS — R350 Frequency of micturition: Secondary | ICD-10-CM | POA: Diagnosis not present

## 2016-02-16 DIAGNOSIS — R351 Nocturia: Secondary | ICD-10-CM | POA: Diagnosis not present

## 2016-03-08 DIAGNOSIS — I119 Hypertensive heart disease without heart failure: Secondary | ICD-10-CM | POA: Diagnosis not present

## 2016-03-08 DIAGNOSIS — Z951 Presence of aortocoronary bypass graft: Secondary | ICD-10-CM | POA: Diagnosis not present

## 2016-03-08 DIAGNOSIS — I483 Typical atrial flutter: Secondary | ICD-10-CM | POA: Diagnosis not present

## 2016-03-08 DIAGNOSIS — I2510 Atherosclerotic heart disease of native coronary artery without angina pectoris: Secondary | ICD-10-CM | POA: Diagnosis not present

## 2016-03-08 DIAGNOSIS — E785 Hyperlipidemia, unspecified: Secondary | ICD-10-CM | POA: Diagnosis not present

## 2016-03-22 DIAGNOSIS — R7309 Other abnormal glucose: Secondary | ICD-10-CM | POA: Diagnosis not present

## 2016-03-22 DIAGNOSIS — I10 Essential (primary) hypertension: Secondary | ICD-10-CM | POA: Diagnosis not present

## 2016-03-22 DIAGNOSIS — E784 Other hyperlipidemia: Secondary | ICD-10-CM | POA: Diagnosis not present

## 2016-03-22 DIAGNOSIS — M859 Disorder of bone density and structure, unspecified: Secondary | ICD-10-CM | POA: Diagnosis not present

## 2016-03-29 DIAGNOSIS — Z23 Encounter for immunization: Secondary | ICD-10-CM | POA: Diagnosis not present

## 2016-03-29 DIAGNOSIS — Z1389 Encounter for screening for other disorder: Secondary | ICD-10-CM | POA: Diagnosis not present

## 2016-03-29 DIAGNOSIS — E668 Other obesity: Secondary | ICD-10-CM | POA: Diagnosis not present

## 2016-03-29 DIAGNOSIS — Z6830 Body mass index (BMI) 30.0-30.9, adult: Secondary | ICD-10-CM | POA: Diagnosis not present

## 2016-03-29 DIAGNOSIS — I119 Hypertensive heart disease without heart failure: Secondary | ICD-10-CM | POA: Diagnosis not present

## 2016-03-29 DIAGNOSIS — R0689 Other abnormalities of breathing: Secondary | ICD-10-CM | POA: Diagnosis not present

## 2016-03-29 DIAGNOSIS — Z1212 Encounter for screening for malignant neoplasm of rectum: Secondary | ICD-10-CM | POA: Diagnosis not present

## 2016-03-29 DIAGNOSIS — I10 Essential (primary) hypertension: Secondary | ICD-10-CM | POA: Diagnosis not present

## 2016-03-29 DIAGNOSIS — E784 Other hyperlipidemia: Secondary | ICD-10-CM | POA: Diagnosis not present

## 2016-03-29 DIAGNOSIS — I480 Paroxysmal atrial fibrillation: Secondary | ICD-10-CM | POA: Diagnosis not present

## 2016-03-29 DIAGNOSIS — R7309 Other abnormal glucose: Secondary | ICD-10-CM | POA: Diagnosis not present

## 2016-03-29 DIAGNOSIS — Z0000 Encounter for general adult medical examination without abnormal findings: Secondary | ICD-10-CM | POA: Diagnosis not present

## 2016-03-29 DIAGNOSIS — I2510 Atherosclerotic heart disease of native coronary artery without angina pectoris: Secondary | ICD-10-CM | POA: Diagnosis not present

## 2016-03-29 DIAGNOSIS — N183 Chronic kidney disease, stage 3 (moderate): Secondary | ICD-10-CM | POA: Diagnosis not present

## 2016-03-29 DIAGNOSIS — Z951 Presence of aortocoronary bypass graft: Secondary | ICD-10-CM | POA: Diagnosis not present

## 2016-04-07 DIAGNOSIS — M1712 Unilateral primary osteoarthritis, left knee: Secondary | ICD-10-CM | POA: Diagnosis not present

## 2016-06-01 DIAGNOSIS — M1712 Unilateral primary osteoarthritis, left knee: Secondary | ICD-10-CM | POA: Diagnosis not present

## 2016-06-08 DIAGNOSIS — M1712 Unilateral primary osteoarthritis, left knee: Secondary | ICD-10-CM | POA: Diagnosis not present

## 2016-06-15 DIAGNOSIS — M1712 Unilateral primary osteoarthritis, left knee: Secondary | ICD-10-CM | POA: Diagnosis not present

## 2016-07-29 DIAGNOSIS — M1712 Unilateral primary osteoarthritis, left knee: Secondary | ICD-10-CM | POA: Diagnosis not present

## 2016-08-10 ENCOUNTER — Other Ambulatory Visit: Payer: Self-pay | Admitting: Internal Medicine

## 2016-08-10 DIAGNOSIS — Z1231 Encounter for screening mammogram for malignant neoplasm of breast: Secondary | ICD-10-CM

## 2016-08-26 ENCOUNTER — Ambulatory Visit
Admission: RE | Admit: 2016-08-26 | Discharge: 2016-08-26 | Disposition: A | Discharge status: Home / Self Care | Payer: Medicare Other | Source: Ambulatory Visit | Attending: Internal Medicine | Admitting: Internal Medicine

## 2016-08-26 DIAGNOSIS — Z1231 Encounter for screening mammogram for malignant neoplasm of breast: Secondary | ICD-10-CM | POA: Diagnosis not present

## 2016-08-26 HISTORY — DX: Personal history of antineoplastic chemotherapy: Z92.21

## 2016-08-26 HISTORY — DX: Personal history of irradiation: Z92.3

## 2016-08-31 DIAGNOSIS — M7752 Other enthesopathy of left foot: Secondary | ICD-10-CM | POA: Diagnosis not present

## 2016-08-31 DIAGNOSIS — M7751 Other enthesopathy of right foot: Secondary | ICD-10-CM | POA: Diagnosis not present

## 2016-08-31 DIAGNOSIS — M71571 Other bursitis, not elsewhere classified, right ankle and foot: Secondary | ICD-10-CM | POA: Diagnosis not present

## 2016-08-31 DIAGNOSIS — M21621 Bunionette of right foot: Secondary | ICD-10-CM | POA: Diagnosis not present

## 2016-08-31 DIAGNOSIS — M21622 Bunionette of left foot: Secondary | ICD-10-CM | POA: Diagnosis not present

## 2016-08-31 DIAGNOSIS — M71572 Other bursitis, not elsewhere classified, left ankle and foot: Secondary | ICD-10-CM | POA: Diagnosis not present

## 2016-09-09 DIAGNOSIS — M1712 Unilateral primary osteoarthritis, left knee: Secondary | ICD-10-CM | POA: Diagnosis not present

## 2016-09-23 DIAGNOSIS — N183 Chronic kidney disease, stage 3 (moderate): Secondary | ICD-10-CM | POA: Diagnosis not present

## 2016-09-23 DIAGNOSIS — R600 Localized edema: Secondary | ICD-10-CM | POA: Diagnosis not present

## 2016-09-23 DIAGNOSIS — I119 Hypertensive heart disease without heart failure: Secondary | ICD-10-CM | POA: Diagnosis not present

## 2016-09-23 DIAGNOSIS — I480 Paroxysmal atrial fibrillation: Secondary | ICD-10-CM | POA: Diagnosis not present

## 2016-09-23 DIAGNOSIS — Z6830 Body mass index (BMI) 30.0-30.9, adult: Secondary | ICD-10-CM | POA: Diagnosis not present

## 2016-09-23 DIAGNOSIS — I2510 Atherosclerotic heart disease of native coronary artery without angina pectoris: Secondary | ICD-10-CM | POA: Diagnosis not present

## 2016-09-23 DIAGNOSIS — M1990 Unspecified osteoarthritis, unspecified site: Secondary | ICD-10-CM | POA: Diagnosis not present

## 2016-09-23 DIAGNOSIS — E668 Other obesity: Secondary | ICD-10-CM | POA: Diagnosis not present

## 2016-09-23 DIAGNOSIS — R7309 Other abnormal glucose: Secondary | ICD-10-CM | POA: Diagnosis not present

## 2016-10-04 DIAGNOSIS — M71572 Other bursitis, not elsewhere classified, left ankle and foot: Secondary | ICD-10-CM | POA: Diagnosis not present

## 2016-10-04 DIAGNOSIS — M71571 Other bursitis, not elsewhere classified, right ankle and foot: Secondary | ICD-10-CM | POA: Diagnosis not present

## 2016-10-17 DIAGNOSIS — M1712 Unilateral primary osteoarthritis, left knee: Secondary | ICD-10-CM | POA: Diagnosis not present

## 2016-10-28 DIAGNOSIS — I2510 Atherosclerotic heart disease of native coronary artery without angina pectoris: Secondary | ICD-10-CM | POA: Diagnosis not present

## 2016-10-28 DIAGNOSIS — I483 Typical atrial flutter: Secondary | ICD-10-CM | POA: Diagnosis not present

## 2016-10-28 DIAGNOSIS — E785 Hyperlipidemia, unspecified: Secondary | ICD-10-CM | POA: Diagnosis not present

## 2016-10-28 DIAGNOSIS — I119 Hypertensive heart disease without heart failure: Secondary | ICD-10-CM | POA: Diagnosis not present

## 2016-10-28 DIAGNOSIS — Z01810 Encounter for preprocedural cardiovascular examination: Secondary | ICD-10-CM | POA: Diagnosis not present

## 2016-10-28 DIAGNOSIS — Z853 Personal history of malignant neoplasm of breast: Secondary | ICD-10-CM | POA: Diagnosis not present

## 2016-10-28 DIAGNOSIS — Z951 Presence of aortocoronary bypass graft: Secondary | ICD-10-CM | POA: Diagnosis not present

## 2016-10-31 NOTE — Progress Notes (Signed)
Please place orders in EPIC as patient is being scheduled for a pre-op appointment! Thank you! 

## 2016-11-07 ENCOUNTER — Ambulatory Visit: Payer: Self-pay | Admitting: Orthopedic Surgery

## 2016-11-10 DIAGNOSIS — I483 Typical atrial flutter: Secondary | ICD-10-CM | POA: Diagnosis not present

## 2016-11-10 DIAGNOSIS — Z01810 Encounter for preprocedural cardiovascular examination: Secondary | ICD-10-CM | POA: Diagnosis not present

## 2016-11-10 DIAGNOSIS — Z853 Personal history of malignant neoplasm of breast: Secondary | ICD-10-CM | POA: Diagnosis not present

## 2016-11-10 DIAGNOSIS — I2510 Atherosclerotic heart disease of native coronary artery without angina pectoris: Secondary | ICD-10-CM | POA: Diagnosis not present

## 2016-11-10 DIAGNOSIS — I119 Hypertensive heart disease without heart failure: Secondary | ICD-10-CM | POA: Diagnosis not present

## 2016-11-10 DIAGNOSIS — Z951 Presence of aortocoronary bypass graft: Secondary | ICD-10-CM | POA: Diagnosis not present

## 2016-11-10 DIAGNOSIS — E785 Hyperlipidemia, unspecified: Secondary | ICD-10-CM | POA: Diagnosis not present

## 2016-11-14 DIAGNOSIS — Z01810 Encounter for preprocedural cardiovascular examination: Secondary | ICD-10-CM | POA: Diagnosis not present

## 2016-11-18 NOTE — Patient Instructions (Signed)
Mallory Watts  11/18/2016   Your procedure is scheduled on: 11/28/2016    Report to [facility] Main  Entrance   Report to admitting at   0800 AM   Call this number if you have problems the morning of surgery  [phone]   Remember: ONLY 1 PERSON MAY GO WITH YOU TO SHORT STAY TO GET  READY MORNING OF [place].  Do not eat food or drink liquids :After Midnight.     Take these medicines the morning of surgery with A SIP OF WATER: nexium, allegra, metoprolol ( toprol), vesicare                                 You may not have any metal on your body including hair pins and              piercings  Do not wear jewelry, make-up, lotions, powders or perfumes, deodorant             Do not wear nail polish.  Do not shave  48 hours prior to surgery.                 Do not bring valuables to the hospital. [place].  Contacts, dentures or bridgework may not be worn into surgery.  Leave suitcase in the car. After surgery it may be brought to your room.                   Please read over the following fact sheets you were given: _____________________________________________________________________             [facility] - Preparing for Surgery Before surgery, you can play an important role.  Because skin is not sterile, your skin needs to be as free of germs as possible.  You can reduce the number of germs on your skin by washing with CHG (chlorahexidine gluconate) soap before surgery.  CHG is an antiseptic cleaner which kills germs and bonds with the skin to continue killing germs even after washing. Please DO NOT use if you have an allergy to CHG or antibacterial soaps.  If your skin becomes reddened/irritated stop using the CHG and inform your nurse when you arrive at Short Stay. Do not shave (including legs and underarms) for at least 48 hours prior to the first CHG shower.  You may shave your  face/neck. Please follow these instructions carefully:  1.  Shower with CHG Soap the night before surgery and the  morning of Surgery.  2.  If you choose to wash your hair, wash your hair first as usual with your  normal  shampoo.  3.  After you shampoo, rinse your hair and body thoroughly to remove the  shampoo.                           4.  Use CHG as you would any other liquid soap.  You can apply chg directly  to the skin and wash                       Gently with a scrungie or clean washcloth.  5.  Apply the  CHG Soap to your body ONLY FROM THE NECK DOWN.   Do not use on face/ open                           Wound or open sores. Avoid contact with eyes, ears mouth and genitals (private parts).                       Wash face,  Genitals (private parts) with your normal soap.             6.  Wash thoroughly, paying special attention to the area where your surgery  will be performed.  7.  Thoroughly rinse your body with warm water from the neck down.  8.  DO NOT shower/wash with your normal soap after using and rinsing off  the CHG Soap.                9.  Pat yourself dry with a clean towel.            10.  Wear clean pajamas.            11.  Place clean sheets on your bed the night of your first shower and do not  sleep with pets. Day of Surgery : Do not apply any lotions/deodorants the morning of surgery.  Please wear clean clothes to the hospital/surgery center.  FAILURE TO FOLLOW THESE INSTRUCTIONS MAY RESULT IN THE CANCELLATION OF YOUR SURGERY PATIENT SIGNATURE_________________________________  NURSE SIGNATURE__________________________________  ________________________________________________________________________  WHAT IS A BLOOD TRANSFUSION? Blood Transfusion Information  A transfusion is the replacement of blood or some of its parts. Blood is made up of multiple cells which provide different functions.  Red blood cells carry oxygen and are used for blood loss  replacement.  White blood cells fight against infection.  Platelets control bleeding.  Plasma helps clot blood.  Other blood products are available for specialized needs, such as hemophilia or other clotting disorders. BEFORE THE TRANSFUSION  Who gives blood for transfusions?   Healthy volunteers who are fully evaluated to make sure their blood is safe. This is blood bank blood. Transfusion therapy is the safest it has ever been in the practice of medicine. Before blood is taken from a donor, a complete history is taken to make sure that person has no history of diseases nor engages in risky social behavior (examples are intravenous drug use or sexual activity with multiple partners). The donor's travel history is screened to minimize risk of transmitting infections, such as malaria. The donated blood is tested for signs of infectious diseases, such as HIV and hepatitis. The blood is then tested to be sure it is compatible with you in order to minimize the chance of a transfusion reaction. If you or a relative donates blood, this is often done in anticipation of surgery and is not appropriate for emergency situations. It takes many days to process the donated blood. RISKS AND COMPLICATIONS Although transfusion therapy is very safe and saves many lives, the main dangers of transfusion include:   Getting an infectious disease.  Developing a transfusion reaction. This is an allergic reaction to something in the blood you were given. Every precaution is taken to prevent this. The decision to have a blood transfusion has been considered carefully by your caregiver before blood is given. Blood is not given unless the benefits outweigh the risks. AFTER THE TRANSFUSION  Right after receiving a blood transfusion,  you will usually feel much better and more energetic. This is especially true if your red blood cells have gotten low (anemic). The transfusion raises the level of the red blood cells which  carry oxygen, and this usually causes an energy increase.  The nurse administering the transfusion will monitor you carefully for complications. HOME CARE INSTRUCTIONS  No special instructions are needed after a transfusion. You may find your energy is better. Speak with your caregiver about any limitations on activity for underlying diseases you may have. SEEK MEDICAL CARE IF:   Your condition is not improving after your transfusion.  You develop redness or irritation at the intravenous (IV) site. SEEK IMMEDIATE MEDICAL CARE IF:  Any of the following symptoms occur over the next 12 hours:  Shaking chills.  You have a temperature by mouth above 102 F (38.9 C), not controlled by medicine.  Chest, back, or muscle pain.  People around you feel you are not acting correctly or are confused.  Shortness of breath or difficulty breathing.  Dizziness and fainting.  You get a rash or develop hives.  You have a decrease in urine output.  Your urine turns a dark color or changes to pink, red, or brown. Any of the following symptoms occur over the next 10 days:  You have a temperature by mouth above 102 F (38.9 C), not controlled by medicine.  Shortness of breath.  Weakness after normal activity.  The white part of the eye turns yellow (jaundice).  You have a decrease in the amount of urine or are urinating less often.  Your urine turns a dark color or changes to pink, red, or brown. Document Released: 03/25/2000 Document Revised: 06/20/2011 Document Reviewed: 11/12/2007 ExitCare Patient Information 2014 [place].  _______________________________________________________________________  Incentive Spirometer  An incentive spirometer is a tool that can help keep your lungs clear and active. This tool measures how well you are filling your lungs with each breath. Taking long deep breaths may help reverse or decrease the chance of developing breathing (pulmonary) problems  (especially infection) following:  A long period of time when you are unable to move or be active. BEFORE THE PROCEDURE   If the spirometer includes an indicator to show your best effort, your nurse or respiratory therapist will set it to a desired goal.  If possible, sit up straight or lean slightly forward. Try not to slouch.  Hold the incentive spirometer in an upright position. INSTRUCTIONS FOR USE  1. Sit on the edge of your bed if possible, or sit up as far as you can in bed or on a chair. 2. Hold the incentive spirometer in an upright position. 3. Breathe out normally. 4. Place the mouthpiece in your mouth and seal your lips tightly around it. 5. Breathe in slowly and as deeply as possible, raising the piston or the ball toward the top of the column. 6. Hold your breath for 3-5 seconds or for as long as possible. Allow the piston or ball to fall to the bottom of the column. 7. Remove the mouthpiece from your mouth and breathe out normally. 8. Rest for a few seconds and repeat Steps 1 through 7 at least 10 times every 1-2 hours when you are awake. Take your time and take a few normal breaths between deep breaths. 9. The spirometer may include an indicator to show your best effort. Use the indicator as a goal to work toward during each repetition. 10. After each set of 10 deep breaths,  practice coughing to be sure your lungs are clear. If you have an incision (the cut made at the time of surgery), support your incision when coughing by placing a pillow or rolled up towels firmly against it. Once you are able to get out of bed, walk around indoors and cough well. You may stop using the incentive spirometer when instructed by your caregiver.  RISKS AND COMPLICATIONS  Take your time so you do not get dizzy or light-headed.  If you are in pain, you may need to take or ask for pain medication before doing incentive spirometry. It is harder to take a deep breath if you are having  pain. AFTER USE  Rest and breathe slowly and easily.  It can be helpful to keep track of a log of your progress. Your caregiver can provide you with a simple table to help with this. If you are using the spirometer at home, follow these instructions: [place] IF:   You are having difficultly using the spirometer.  You have trouble using the spirometer as often as instructed.  Your pain medication is not giving enough relief while using the spirometer.  You develop fever of 100.5 F (38.1 C) or higher. SEEK IMMEDIATE MEDICAL CARE IF:   You cough up bloody sputum that had not been present before.  You develop fever of 102 F (38.9 C) or greater.  You develop worsening pain at or near the incision site. MAKE SURE YOU:   Understand these instructions.  Will watch your condition.  Will get help right away if you are not doing well or get worse. Document Released: 08/08/2006 Document Revised: 06/20/2011 Document Reviewed: 10/09/2006 [facility] Patient Information 2014 [place], [place].   ________________________________________________________________________

## 2016-11-21 ENCOUNTER — Encounter (HOSPITAL_COMMUNITY)
Admission: RE | Admit: 2016-11-21 | Discharge: 2016-11-21 | Disposition: A | Discharge status: Home / Self Care | Payer: Medicare Other | Source: Ambulatory Visit | Attending: Orthopedic Surgery | Admitting: Orthopedic Surgery

## 2016-11-21 ENCOUNTER — Encounter (HOSPITAL_COMMUNITY): Payer: Self-pay

## 2016-11-21 DIAGNOSIS — Z01812 Encounter for preprocedural laboratory examination: Secondary | ICD-10-CM | POA: Insufficient documentation

## 2016-11-21 DIAGNOSIS — M1712 Unilateral primary osteoarthritis, left knee: Secondary | ICD-10-CM | POA: Insufficient documentation

## 2016-11-21 LAB — COMPREHENSIVE METABOLIC PANEL
ALBUMIN: 3.9 g/dL (ref 3.5–5.0)
ALK PHOS: 50 U/L (ref 38–126)
ALT: 17 U/L (ref 14–54)
ANION GAP: 8 (ref 5–15)
AST: 19 U/L (ref 15–41)
BILIRUBIN TOTAL: 0.8 mg/dL (ref 0.3–1.2)
BUN: 26 mg/dL — AB (ref 6–20)
CALCIUM: 8.9 mg/dL (ref 8.9–10.3)
CO2: 28 mmol/L (ref 22–32)
Chloride: 106 mmol/L (ref 101–111)
Creatinine, Ser: 1.22 mg/dL — ABNORMAL HIGH (ref 0.44–1.00)
GFR calc Af Amer: 50 mL/min — ABNORMAL LOW (ref >60)
GFR, EST NON AFRICAN AMERICAN: 43 mL/min — AB (ref >60)
GLUCOSE: 105 mg/dL — AB (ref 65–99)
Potassium: 3.3 mmol/L — ABNORMAL LOW (ref 3.5–5.1)
Sodium: 142 mmol/L (ref 135–145)
TOTAL PROTEIN: 6.6 g/dL (ref 6.5–8.1)

## 2016-11-21 LAB — ABO/RH: ABO/RH(D): A POS

## 2016-11-21 LAB — CBC
HEMATOCRIT: 39.9 % (ref 36.0–46.0)
HEMOGLOBIN: 13.3 g/dL (ref 12.0–15.0)
MCH: 30 pg (ref 26.0–34.0)
MCHC: 33.3 g/dL (ref 30.0–36.0)
MCV: 90.1 fL (ref 78.0–100.0)
Platelets: 204 10*3/uL (ref 150–400)
RBC: 4.43 MIL/uL (ref 3.87–5.11)
RDW: 13.7 % (ref 11.5–15.5)
WBC: 5.9 10*3/uL (ref 4.0–10.5)

## 2016-11-21 LAB — APTT: APTT: 29 s (ref 24–36)

## 2016-11-21 LAB — PROTIME-INR
INR: 0.99
PROTHROMBIN TIME: 13.1 s (ref 11.4–15.2)

## 2016-11-21 LAB — SURGICAL PCR SCREEN
MRSA, PCR: NEGATIVE
STAPHYLOCOCCUS AUREUS: POSITIVE — AB

## 2016-11-21 NOTE — Progress Notes (Signed)
11/10/2016-Stress Test on chart EKG-10/28/16 on chart  ECHO-11/14/16-on chart  09/23/16-LOV- Dr Virgina Jock on chart

## 2016-11-21 NOTE — Progress Notes (Signed)
CMP done 11/21/16 faxed via epic to Dr Wynelle Link.

## 2016-11-22 ENCOUNTER — Ambulatory Visit: Payer: Self-pay | Admitting: Orthopedic Surgery

## 2016-11-22 NOTE — Progress Notes (Signed)
Please note that a Potassium level has been added to be drawn immediately upon arrival on Monday morning prior to surgery to recheck potassium level. Arlee Muslim, PA-C

## 2016-11-27 ENCOUNTER — Ambulatory Visit: Payer: Self-pay | Admitting: Orthopedic Surgery

## 2016-11-27 NOTE — Anesthesia Preprocedure Evaluation (Addendum)
Anesthesia Evaluation  Patient identified by MRN, date of birth, ID band Patient awake    Reviewed: Allergy & Precautions, H&P , NPO status   History of Anesthesia Complications (+) PONV and history of anesthetic complications  Airway Mallampati: III  TM Distance: <3 FB Neck ROM: Limited  Mouth opening: Limited Mouth Opening  Dental  (+) Teeth Intact   Pulmonary shortness of breath, former smoker,    breath sounds clear to auscultation       Cardiovascular hypertension, + CABG  + dysrhythmias Atrial Fibrillation  Rhythm:Regular Rate:Normal     Neuro/Psych    GI/Hepatic Neg liver ROS, GERD  ,  Endo/Other  negative endocrine ROS  Renal/GU negative Renal ROS     Musculoskeletal  (+) Arthritis ,   Abdominal   Peds  Hematology negative hematology ROS (+)   Anesthesia Other Findings ADENOMATOUS COLONIC POLYP ESOPHAGEAL STRICTURE ESOPHAGEAL REFLUX GASTRITIS, ACUTE RECTAL BLEEDING PERSONAL HX COLONIC POLYPS GASTROESOPHAGEAL REFLUX DISEASE, HX OF Cervical spondylosis with myelopathy and radiculopathy    Reproductive/Obstetrics                            Anesthesia Physical  Anesthesia Plan  ASA: III  Anesthesia Plan: Spinal   Post-op Pain Management:  Regional for Post-op pain   Induction:   PONV Risk Score and Plan: 2 and Ondansetron, Dexamethasone, Treatment may vary due to age or medical condition and Propofol infusion  Airway Management Planned: Nasal Cannula and Natural Airway  Additional Equipment:   Intra-op Plan:   Post-operative Plan: Extubation in OR  Informed Consent: I have reviewed the patients History and Physical, chart, labs and discussed the procedure including the risks, benefits and alternatives for the proposed anesthesia with the patient or authorized representative who has indicated his/her understanding and acceptance.   Dental advisory given  Plan  Discussed with: CRNA  Anesthesia Plan Comments:        Anesthesia Quick Evaluation

## 2016-11-27 NOTE — H&P (Signed)
Mallory Watts DOB: [birth year] Single / Language: Cleophus Molt / Race: White Female Date of Admission:  11/28/2016 CC:  Left knee pain History of Present Illness The patient is a 76 year old female who comes in for a preoperative History and Physical. The patient is scheduled for a left total knee arthroplasty to be performed by Dr. Dione Plover. Aluisio, MD at [facility] on 11/28/2016. The patient is a 76 year old female who presented for follow up of their knee. The patient is being followed for their left knee pain and osteoarthritis. They are now months out from cortisone injection. Symptoms reported include: pain, swelling, aching, stiffness, instability and difficulty ambulating. The patient feels that they are doing poorly and report their pain level to be moderate. The following medication has been used for pain control: Tylenol. The patient has not gotten any relief of their symptoms with Cortisone injections (relief for about 1 week) or viscosupplementation. The patient indicates that they have questions or concerns today regarding pain and their progress at this point. Unfortunately, her left knee pain is getting progressively worse. The injections have not helped at all. It is hurting her at all times. It is limiting what she can and cannot do. She is at a stage now where she feels like she needs to have a more permanent solution to her knee problem. She is not having any associated hip pain. She is not having lower extremity weakness or paresthesia with this. She is ready to proceed with surgery. They have been treated conservatively in the past for the above stated problem and despite conservative measures, they continue to have progressive pain and severe functional limitations and dysfunction. They have failed non-operative management including home exercise, medications, and injections. It is felt that they would benefit from undergoing total joint replacement. Risks and benefits of the  procedure have been discussed with the patient and they elect to proceed with surgery. There are no active contraindications to surgery such as ongoing infection or rapidly progressive neurological disease.  Past Medical History Breast Cancer  Left-sided - AVOID LEFT ARM FOR BP'S AND IV'S Cardiac Arrhythmia  Atrial Flutter Coronary artery disease  Gastroesophageal Reflux Disease  High blood pressure  Hypercholesterolemia  Irritable bowel syndrome  Primary osteoarthritis of left knee (M17.12)  Hemorrhoids  Urinary Incontinence  Osteopenia  Sexually transmitted disease  Herpes Menopause    Allergies  Sulfa Antibiotics  Hives.  Family History Cerebrovascular Accident  mother Depression  brother Diabetes Mellitus  grandmother fathers side and brother Heart Disease  father Hypertension  mother and father Rheumatoid Arthritis  mother  Social History Children  0 Current drinker  07/30/2015: Currently drinks wine only occasionally per week Current work status  retired Exercise  Exercises rarely; does other Living situation  live with partner Marital status  single No history of drug/alcohol rehab  Not under pain contract  Number of flights of stairs before winded  2-3 Tobacco / smoke exposure  07/30/2015: no Tobacco use  07/30/2015: former smoker smoke(d) 1 pack(s) per day  Medication History AlerTab (25MG  Tablet, Oral) Active. Meloxicam (15MG  Tablet, 1 (one) Tablet Oral daily, Taken starting 09/09/2016) Active. Tylenol Arthritis Pain (650MG  Tablet ER, Oral) Active. Nitroglycerin (0.4MG  Tab Sublingual, Sublingual as needed) Active. Aspirin (81MG  Tablet, Oral) Active. Atorvastatin Calcium (80MG  Tablet, Oral) Active. Potassium Chloride ER (10MEQ Capsule ER, 1 Oral two times daily) Active. Metoprolol Succinate ER (50MG  Tablet ER 24HR, Oral two times daily) Active. ValACYclovir HCl (500MG  Tablet, Oral) Active.  Triamterene-HCTZ  (37.5-25MG  Tablet, Oral) Active. Esomeprazole Magnesium (40MG  Capsule DR, Oral) Active. Fosamax (10MG  Tablet, 4 Oral once a week) Active. Magnesium (200MG  Tablet, Oral every other day) Active. Calcium 600 + D (600-200MG -UNIT Tablet, Oral) Active. Vitamin D (400UNIT Tablet, Oral) Active. Lunesta (1/2-1 Oral at bedtime) Specific strength unknown - Active. Zetia (10MG  Tablet, Oral) Active. VESIcare (5MG  Tablet, Oral) Active.  Past Surgical History Breast Biopsy  left Breast Mass; Local Excision  left Cataract Surgery  bilateral Colon Polyp Removal - Open  Coronary Artery Bypass Graft  1 vessel Neck Disc Surgery  Tonsillectomy   Review of Systems General Not Present- Chills, Fatigue, Fever, Memory Loss, Night Sweats, Weight Gain and Weight Loss. Skin Not Present- Eczema, Hives, Itching, Lesions and Rash. HEENT Not Present- Dentures, Double Vision, Headache, Hearing Loss, Tinnitus and Visual Loss. Respiratory Not Present- Allergies, Chronic Cough, Coughing up blood, Shortness of breath at rest and Shortness of breath with exertion. Cardiovascular Not Present- Chest Pain, Difficulty Breathing Lying Down, Murmur, Palpitations, Racing/skipping heartbeats and Swelling. Gastrointestinal Not Present- Abdominal Pain, Bloody Stool, Constipation, Diarrhea, Difficulty Swallowing, Heartburn, Jaundice, Loss of appetitie, Nausea and Vomiting. Female Genitourinary Not Present- Blood in Urine, Discharge, Flank Pain, Incontinence, Painful Urination, Urgency, Urinary frequency, Urinary Retention, Urinating at Night and Weak urinary stream. Musculoskeletal Present- Joint Pain. Not Present- Back Pain, Joint Swelling, Morning Stiffness, Muscle Pain, Muscle Weakness and Spasms. Neurological Not Present- Blackout spells, Difficulty with balance, Dizziness, Paralysis, Tremor and Weakness. Psychiatric Not Present- Insomnia.  Vitals  Weight: 165 lb Height: 63in Weight was reported by  patient. Height was reported by patient. Body Surface Area: 1.78 m Body Mass Index: 29.23 kg/m  Pulse: 68 (Regular)  BP: 124/68 (Sitting, Right Arm, Standard) AVOID LEFT ARM FOR BP'S AND IV'S   Physical Exam General Mental Status -Alert, cooperative and good historian. General Appearance-pleasant, Not in acute distress. Orientation-Oriented X3. Build & Nutrition-Well nourished and Well developed.  Head and Neck Head-normocephalic, atraumatic . Neck Global Assessment - supple, no bruit auscultated on the right, no bruit auscultated on the left.  Eye Vision-Wears corrective lenses. Pupil - Bilateral-Regular and Round. Motion - Bilateral-EOMI.  Chest and Lung Exam Auscultation Breath sounds - clear at anterior chest wall and clear at posterior chest wall. Adventitious sounds - No Adventitious sounds.  Cardiovascular Auscultation Rhythm - Regular rate and rhythm. Heart Sounds - S1 WNL and S2 WNL. Murmurs & Other Heart Sounds - Auscultation of the heart reveals - No Murmurs.  Abdomen Palpation/Percussion Tenderness - Abdomen is non-tender to palpation. Rigidity (guarding) - Abdomen is soft. Auscultation Auscultation of the abdomen reveals - Bowel sounds normal.  Female Genitourinary Note: Not done, not pertinent to present illness  Musculoskeletal Note: Well-developed female. Alert and oriented, in no apparent distress. Evaluation of her left hip has normal range of motion, no discomfort. Her left knee shows no effusion. Range of motion of left knee is about 5 to 125. There is moderate crepitus on range of motion with tenderness, medial greater than lateral and no instability noted. Pulses, sensation and motor are intact distally.  RADIOGRAPHS Radiographs are reviewed, AP and lateral, showing that she has bone-on-bone arthritis in the medial and patellofemoral compartments of the knee with tibial subluxation.  Assessment & Plan Primary  osteoarthritis of left knee (M17.12)  Note:Surgical Plans: Left Total Knee Replacement  Disposition: Home with friend  PCP: Dr. [name] - pending Cards: Dr. [name] - pending  Topical TXA - CAD, Bypass Surgery, Breast Cancer  Anesthesia Issues:  None  Patient was instructed on what medications to stop prior to surgery.  AVOID LEFT ARM FOR BP'S AND IV'S  Signed electronically by Joelene Millin, III PA-C

## 2016-11-28 ENCOUNTER — Inpatient Hospital Stay (HOSPITAL_COMMUNITY): Payer: Medicare Other | Admitting: Anesthesiology

## 2016-11-28 ENCOUNTER — Encounter (HOSPITAL_COMMUNITY): Payer: Self-pay

## 2016-11-28 ENCOUNTER — Encounter (HOSPITAL_COMMUNITY)
Admission: RE | Disposition: A | Discharge status: Home / Self Care | Payer: Self-pay | Source: Ambulatory Visit | Attending: Orthopedic Surgery

## 2016-11-28 ENCOUNTER — Inpatient Hospital Stay (HOSPITAL_COMMUNITY)
Admission: RE | Admit: 2016-11-28 | Discharge: 2016-11-30 | DRG: 470 | Disposition: A | Discharge status: Home / Self Care | Payer: Medicare Other | Source: Ambulatory Visit | Attending: Orthopedic Surgery | Admitting: Orthopedic Surgery

## 2016-11-28 DIAGNOSIS — I2510 Atherosclerotic heart disease of native coronary artery without angina pectoris: Secondary | ICD-10-CM | POA: Diagnosis present

## 2016-11-28 DIAGNOSIS — Z7983 Long term (current) use of bisphosphonates: Secondary | ICD-10-CM | POA: Diagnosis not present

## 2016-11-28 DIAGNOSIS — I4891 Unspecified atrial fibrillation: Secondary | ICD-10-CM | POA: Diagnosis present

## 2016-11-28 DIAGNOSIS — Z9842 Cataract extraction status, left eye: Secondary | ICD-10-CM

## 2016-11-28 DIAGNOSIS — K219 Gastro-esophageal reflux disease without esophagitis: Secondary | ICD-10-CM | POA: Diagnosis present

## 2016-11-28 DIAGNOSIS — M179 Osteoarthritis of knee, unspecified: Secondary | ICD-10-CM

## 2016-11-28 DIAGNOSIS — Z9841 Cataract extraction status, right eye: Secondary | ICD-10-CM | POA: Diagnosis not present

## 2016-11-28 DIAGNOSIS — M1712 Unilateral primary osteoarthritis, left knee: Principal | ICD-10-CM | POA: Diagnosis present

## 2016-11-28 DIAGNOSIS — G8918 Other acute postprocedural pain: Secondary | ICD-10-CM | POA: Diagnosis not present

## 2016-11-28 DIAGNOSIS — Z87891 Personal history of nicotine dependence: Secondary | ICD-10-CM | POA: Diagnosis not present

## 2016-11-28 DIAGNOSIS — Z9221 Personal history of antineoplastic chemotherapy: Secondary | ICD-10-CM | POA: Diagnosis not present

## 2016-11-28 DIAGNOSIS — I10 Essential (primary) hypertension: Secondary | ICD-10-CM | POA: Diagnosis present

## 2016-11-28 DIAGNOSIS — Z7982 Long term (current) use of aspirin: Secondary | ICD-10-CM

## 2016-11-28 DIAGNOSIS — Z853 Personal history of malignant neoplasm of breast: Secondary | ICD-10-CM

## 2016-11-28 DIAGNOSIS — Z951 Presence of aortocoronary bypass graft: Secondary | ICD-10-CM | POA: Diagnosis not present

## 2016-11-28 DIAGNOSIS — Z882 Allergy status to sulfonamides status: Secondary | ICD-10-CM | POA: Diagnosis not present

## 2016-11-28 DIAGNOSIS — Z888 Allergy status to other drugs, medicaments and biological substances status: Secondary | ICD-10-CM

## 2016-11-28 DIAGNOSIS — M8580 Other specified disorders of bone density and structure, unspecified site: Secondary | ICD-10-CM | POA: Diagnosis present

## 2016-11-28 DIAGNOSIS — K625 Hemorrhage of anus and rectum: Secondary | ICD-10-CM | POA: Diagnosis not present

## 2016-11-28 DIAGNOSIS — Z923 Personal history of irradiation: Secondary | ICD-10-CM | POA: Diagnosis not present

## 2016-11-28 DIAGNOSIS — M1710 Unilateral primary osteoarthritis, unspecified knee: Secondary | ICD-10-CM | POA: Diagnosis present

## 2016-11-28 DIAGNOSIS — K222 Esophageal obstruction: Secondary | ICD-10-CM | POA: Diagnosis not present

## 2016-11-28 HISTORY — PX: TOTAL KNEE ARTHROPLASTY: SHX125

## 2016-11-28 LAB — BASIC METABOLIC PANEL
Anion gap: 7 (ref 5–15)
BUN: 22 mg/dL — AB (ref 6–20)
CHLORIDE: 107 mmol/L (ref 101–111)
CO2: 26 mmol/L (ref 22–32)
Calcium: 8.7 mg/dL — ABNORMAL LOW (ref 8.9–10.3)
Creatinine, Ser: 1.05 mg/dL — ABNORMAL HIGH (ref 0.44–1.00)
GFR calc Af Amer: 60 mL/min — ABNORMAL LOW (ref >60)
GFR calc non Af Amer: 51 mL/min — ABNORMAL LOW (ref >60)
GLUCOSE: 107 mg/dL — AB (ref 65–99)
POTASSIUM: 3.4 mmol/L — AB (ref 3.5–5.1)
Sodium: 140 mmol/L (ref 135–145)

## 2016-11-28 LAB — TYPE AND SCREEN
ABO/RH(D): A POS
ANTIBODY SCREEN: NEGATIVE

## 2016-11-28 SURGERY — ARTHROPLASTY, KNEE, TOTAL
Anesthesia: Spinal | Site: Knee | Laterality: Left

## 2016-11-28 MED ORDER — POLYETHYLENE GLYCOL 3350 17 G PO PACK: 17.0000 g | PACK | Freq: Every day | ORAL | Status: DC | PRN

## 2016-11-28 MED ORDER — ONDANSETRON HCL 4 MG PO TABS
4.0000 mg | ORAL_TABLET | Freq: Four times a day (QID) | ORAL | Status: DC | PRN
  Administered 2016-11-30: 4 mg via ORAL
  Filled 2016-11-28 (×2): qty 1

## 2016-11-28 MED ORDER — MEPERIDINE HCL 50 MG/ML IJ SOLN: 6.2500 mg | INTRAMUSCULAR | Status: DC | PRN

## 2016-11-28 MED ORDER — ONDANSETRON HCL 4 MG/2ML IJ SOLN
4.0000 mg | Freq: Four times a day (QID) | INTRAMUSCULAR | Status: DC | PRN
  Administered 2016-11-29 (×2): 4 mg via INTRAVENOUS
  Filled 2016-11-28 (×2): qty 2

## 2016-11-28 MED ORDER — BUPIVACAINE LIPOSOME 1.3 % IJ SUSP
INTRAMUSCULAR | Status: DC | PRN
  Administered 2016-11-28: 20 mL

## 2016-11-28 MED ORDER — SODIUM CHLORIDE 0.9 % IR SOLN
Status: DC | PRN
  Administered 2016-11-28: 1000 mL

## 2016-11-28 MED ORDER — DIPHENHYDRAMINE HCL 12.5 MG/5ML PO ELIX: 12.5000 mg | ORAL_SOLUTION | ORAL | Status: DC | PRN

## 2016-11-28 MED ORDER — BUPIVACAINE HCL (PF) 0.75 % IJ SOLN
INTRAMUSCULAR | Status: DC | PRN
  Administered 2016-11-28: 20 mL via PERINEURAL

## 2016-11-28 MED ORDER — DEXAMETHASONE SODIUM PHOSPHATE 10 MG/ML IJ SOLN
INTRAMUSCULAR | Status: AC
  Filled 2016-11-28: qty 1

## 2016-11-28 MED ORDER — METOPROLOL SUCCINATE ER 50 MG PO TB24
50.0000 mg | ORAL_TABLET | Freq: Two times a day (BID) | ORAL | Status: DC
  Administered 2016-11-28 – 2016-11-30 (×4): 50 mg via ORAL
  Filled 2016-11-28 (×4): qty 1

## 2016-11-28 MED ORDER — TRIAMTERENE-HCTZ 37.5-25 MG PO TABS
1.0000 | ORAL_TABLET | Freq: Every day | ORAL | Status: DC
  Administered 2016-11-29 – 2016-11-30 (×2): 1 via ORAL
  Filled 2016-11-28 (×2): qty 1

## 2016-11-28 MED ORDER — ACETAMINOPHEN 10 MG/ML IV SOLN
INTRAVENOUS | Status: AC
  Filled 2016-11-28: qty 100

## 2016-11-28 MED ORDER — EZETIMIBE 10 MG PO TABS
10.0000 mg | ORAL_TABLET | Freq: Every day | ORAL | Status: DC
  Administered 2016-11-29 – 2016-11-30 (×2): 10 mg via ORAL
  Filled 2016-11-28 (×2): qty 1

## 2016-11-28 MED ORDER — PROPOFOL 10 MG/ML IV BOLUS
INTRAVENOUS | Status: AC
  Filled 2016-11-28: qty 40

## 2016-11-28 MED ORDER — TRAMADOL HCL 50 MG PO TABS: 50.0000 mg | ORAL_TABLET | Freq: Four times a day (QID) | ORAL | Status: DC | PRN

## 2016-11-28 MED ORDER — SODIUM CHLORIDE 0.9 % IJ SOLN
INTRAMUSCULAR | Status: AC
  Filled 2016-11-28: qty 10

## 2016-11-28 MED ORDER — SODIUM CHLORIDE 0.9 % IV SOLN
INTRAVENOUS | Status: DC
  Administered 2016-11-28 – 2016-11-29 (×2): via INTRAVENOUS

## 2016-11-28 MED ORDER — CEFAZOLIN SODIUM-DEXTROSE 2-4 GM/100ML-% IV SOLN
2.0000 g | Freq: Four times a day (QID) | INTRAVENOUS | Status: AC
Start: 2016-11-28 — End: 2016-11-28
  Administered 2016-11-28 (×2): 2 g via INTRAVENOUS
  Filled 2016-11-28 (×2): qty 100

## 2016-11-28 MED ORDER — NITROGLYCERIN 0.4 MG SL SUBL: 0.4000 mg | SUBLINGUAL_TABLET | SUBLINGUAL | Status: DC | PRN

## 2016-11-28 MED ORDER — GABAPENTIN 300 MG PO CAPS
300.0000 mg | ORAL_CAPSULE | Freq: Once | ORAL | Status: AC
  Administered 2016-11-28: 300 mg via ORAL

## 2016-11-28 MED ORDER — DEXAMETHASONE SODIUM PHOSPHATE 10 MG/ML IJ SOLN
10.0000 mg | Freq: Once | INTRAMUSCULAR | Status: AC
  Administered 2016-11-28: 10 mg via INTRAVENOUS

## 2016-11-28 MED ORDER — METHOCARBAMOL 1000 MG/10ML IJ SOLN
500.0000 mg | Freq: Four times a day (QID) | INTRAVENOUS | Status: DC | PRN
  Administered 2016-11-28: 500 mg via INTRAVENOUS
  Filled 2016-11-28: qty 550

## 2016-11-28 MED ORDER — METOCLOPRAMIDE HCL 5 MG PO TABS: 5.0000 mg | ORAL_TABLET | Freq: Three times a day (TID) | ORAL | Status: DC | PRN

## 2016-11-28 MED ORDER — PROPOFOL 500 MG/50ML IV EMUL
INTRAVENOUS | Status: DC | PRN
  Administered 2016-11-28: 75 ug/kg/min via INTRAVENOUS

## 2016-11-28 MED ORDER — DOCUSATE SODIUM 100 MG PO CAPS
100.0000 mg | ORAL_CAPSULE | Freq: Two times a day (BID) | ORAL | Status: DC
  Administered 2016-11-28 – 2016-11-30 (×4): 100 mg via ORAL
  Filled 2016-11-28 (×4): qty 1

## 2016-11-28 MED ORDER — LIDOCAINE 2% (20 MG/ML) 5 ML SYRINGE
INTRAMUSCULAR | Status: AC
  Filled 2016-11-28: qty 5

## 2016-11-28 MED ORDER — ONDANSETRON HCL 4 MG/2ML IJ SOLN
INTRAMUSCULAR | Status: AC
  Filled 2016-11-28: qty 2

## 2016-11-28 MED ORDER — TRANEXAMIC ACID 1000 MG/10ML IV SOLN
INTRAVENOUS | Status: AC | PRN
  Administered 2016-11-28: 2000 mg via TOPICAL

## 2016-11-28 MED ORDER — LACTATED RINGERS IV SOLN
INTRAVENOUS | Status: DC
  Administered 2016-11-28: 11:00:00 via INTRAVENOUS
  Administered 2016-11-28: 1000 mL via INTRAVENOUS

## 2016-11-28 MED ORDER — RIVAROXABAN 10 MG PO TABS
10.0000 mg | ORAL_TABLET | Freq: Every day | ORAL | Status: DC
  Administered 2016-11-29 – 2016-11-30 (×2): 10 mg via ORAL
  Filled 2016-11-28 (×2): qty 1

## 2016-11-28 MED ORDER — HYDROCODONE-ACETAMINOPHEN 7.5-325 MG PO TABS
1.0000 | ORAL_TABLET | ORAL | Status: DC | PRN
  Administered 2016-11-28 (×2): 1 via ORAL
  Administered 2016-11-28: 2 via ORAL
  Administered 2016-11-28: 1 via ORAL
  Administered 2016-11-29 – 2016-11-30 (×8): 2 via ORAL
  Filled 2016-11-28 (×3): qty 2
  Filled 2016-11-28: qty 1
  Filled 2016-11-28 (×2): qty 2
  Filled 2016-11-28 (×2): qty 1
  Filled 2016-11-28 (×4): qty 2

## 2016-11-28 MED ORDER — GABAPENTIN 300 MG PO CAPS
ORAL_CAPSULE | ORAL | Status: AC
  Administered 2016-11-28: 300 mg via ORAL
  Filled 2016-11-28: qty 1

## 2016-11-28 MED ORDER — MENTHOL 3 MG MT LOZG: 1.0000 | LOZENGE | OROMUCOSAL | Status: DC | PRN

## 2016-11-28 MED ORDER — ZOLPIDEM TARTRATE 5 MG PO TABS: 5.0000 mg | ORAL_TABLET | Freq: Every evening | ORAL | Status: DC | PRN

## 2016-11-28 MED ORDER — DARIFENACIN HYDROBROMIDE ER 7.5 MG PO TB24
7.5000 mg | ORAL_TABLET | Freq: Every day | ORAL | Status: DC
  Administered 2016-11-29 – 2016-11-30 (×2): 7.5 mg via ORAL
  Filled 2016-11-28 (×2): qty 1

## 2016-11-28 MED ORDER — CEFAZOLIN SODIUM-DEXTROSE 2-4 GM/100ML-% IV SOLN
2.0000 g | INTRAVENOUS | Status: AC
  Administered 2016-11-28: 2 g via INTRAVENOUS

## 2016-11-28 MED ORDER — BISACODYL 10 MG RE SUPP: 10.0000 mg | Freq: Every day | RECTAL | Status: DC | PRN

## 2016-11-28 MED ORDER — CHLORHEXIDINE GLUCONATE 4 % EX LIQD: 60.0000 mL | Freq: Once | CUTANEOUS | Status: DC

## 2016-11-28 MED ORDER — MORPHINE SULFATE (PF) 4 MG/ML IV SOLN
1.0000 mg | INTRAVENOUS | Status: DC | PRN
Start: 2016-11-28 — End: 2016-11-28

## 2016-11-28 MED ORDER — FENTANYL CITRATE (PF) 100 MCG/2ML IJ SOLN
50.0000 ug | INTRAMUSCULAR | Status: DC | PRN
  Administered 2016-11-28: 100 ug via INTRAVENOUS

## 2016-11-28 MED ORDER — STERILE WATER FOR IRRIGATION IR SOLN
Status: DC | PRN
  Administered 2016-11-28: 2000 mL

## 2016-11-28 MED ORDER — FLEET ENEMA 7-19 GM/118ML RE ENEM: 1.0000 | ENEMA | Freq: Once | RECTAL | Status: DC | PRN

## 2016-11-28 MED ORDER — METHOCARBAMOL 500 MG PO TABS
500.0000 mg | ORAL_TABLET | Freq: Four times a day (QID) | ORAL | Status: DC | PRN
  Administered 2016-11-28 – 2016-11-29 (×2): 500 mg via ORAL
  Filled 2016-11-28 (×2): qty 1

## 2016-11-28 MED ORDER — SODIUM CHLORIDE 0.9 % IV SOLN
2000.0000 mg | Freq: Once | INTRAVENOUS | Status: DC
  Filled 2016-11-28: qty 20

## 2016-11-28 MED ORDER — MIDAZOLAM HCL 5 MG/ML IJ SOLN
1.0000 mg | INTRAMUSCULAR | Status: DC | PRN
  Administered 2016-11-28: 2 mg via INTRAVENOUS

## 2016-11-28 MED ORDER — PHENOL 1.4 % MT LIQD
1.0000 | OROMUCOSAL | Status: DC | PRN
  Filled 2016-11-28: qty 177

## 2016-11-28 MED ORDER — LIDOCAINE 2% (20 MG/ML) 5 ML SYRINGE
INTRAMUSCULAR | Status: DC | PRN
  Administered 2016-11-28: 50 mg via INTRAVENOUS

## 2016-11-28 MED ORDER — LACTATED RINGERS IV SOLN: INTRAVENOUS | Status: DC

## 2016-11-28 MED ORDER — LORATADINE 10 MG PO TABS
10.0000 mg | ORAL_TABLET | Freq: Every day | ORAL | Status: DC
  Administered 2016-11-29 – 2016-11-30 (×2): 10 mg via ORAL
  Filled 2016-11-28 (×2): qty 1

## 2016-11-28 MED ORDER — 0.9 % SODIUM CHLORIDE (POUR BTL) OPTIME
TOPICAL | Status: DC | PRN
  Administered 2016-11-28: 1000 mL

## 2016-11-28 MED ORDER — ONDANSETRON HCL 4 MG/2ML IJ SOLN
INTRAMUSCULAR | Status: DC | PRN
Start: 2016-11-28 — End: 2016-11-28
  Administered 2016-11-28: 4 mg via INTRAVENOUS

## 2016-11-28 MED ORDER — DEXAMETHASONE SODIUM PHOSPHATE 10 MG/ML IJ SOLN
10.0000 mg | Freq: Once | INTRAMUSCULAR | Status: AC
  Administered 2016-11-29: 08:00:00 10 mg via INTRAVENOUS
  Filled 2016-11-28: qty 1

## 2016-11-28 MED ORDER — MORPHINE SULFATE (PF) 2 MG/ML IV SOLN
1.0000 mg | INTRAVENOUS | Status: DC | PRN
  Administered 2016-11-28: 20:00:00 1 mg via INTRAVENOUS
  Filled 2016-11-28: qty 1

## 2016-11-28 MED ORDER — BUPIVACAINE LIPOSOME 1.3 % IJ SUSP
20.0000 mL | Freq: Once | INTRAMUSCULAR | Status: DC
  Filled 2016-11-28: qty 20

## 2016-11-28 MED ORDER — FENTANYL CITRATE (PF) 100 MCG/2ML IJ SOLN
INTRAMUSCULAR | Status: AC
  Administered 2016-11-28: 100 ug via INTRAVENOUS
  Filled 2016-11-28: qty 2

## 2016-11-28 MED ORDER — MIDAZOLAM HCL 2 MG/2ML IJ SOLN
INTRAMUSCULAR | Status: AC
  Filled 2016-11-28: qty 2

## 2016-11-28 MED ORDER — SODIUM CHLORIDE 0.9 % IJ SOLN
INTRAMUSCULAR | Status: AC
  Filled 2016-11-28: qty 50

## 2016-11-28 MED ORDER — SODIUM CHLORIDE 0.9 % IJ SOLN
INTRAMUSCULAR | Status: DC | PRN
  Administered 2016-11-28: 60 mL

## 2016-11-28 MED ORDER — PANTOPRAZOLE SODIUM 40 MG PO TBEC
80.0000 mg | DELAYED_RELEASE_TABLET | Freq: Every day | ORAL | Status: DC
  Administered 2016-11-29: 80 mg via ORAL
  Filled 2016-11-28: qty 2

## 2016-11-28 MED ORDER — ATORVASTATIN CALCIUM 40 MG PO TABS
80.0000 mg | ORAL_TABLET | Freq: Every day | ORAL | Status: DC
  Administered 2016-11-28 – 2016-11-29 (×2): 80 mg via ORAL
  Filled 2016-11-28 (×2): qty 2

## 2016-11-28 MED ORDER — ACETAMINOPHEN 10 MG/ML IV SOLN
1000.0000 mg | Freq: Once | INTRAVENOUS | Status: AC
  Administered 2016-11-28: 1000 mg via INTRAVENOUS

## 2016-11-28 MED ORDER — POTASSIUM CHLORIDE CRYS ER 20 MEQ PO TBCR
20.0000 meq | EXTENDED_RELEASE_TABLET | Freq: Two times a day (BID) | ORAL | Status: DC
  Administered 2016-11-28 – 2016-11-30 (×4): 20 meq via ORAL
  Filled 2016-11-28 (×4): qty 1

## 2016-11-28 MED ORDER — FENTANYL CITRATE (PF) 100 MCG/2ML IJ SOLN: 25.0000 ug | INTRAMUSCULAR | Status: DC | PRN

## 2016-11-28 MED ORDER — CEFAZOLIN SODIUM-DEXTROSE 2-4 GM/100ML-% IV SOLN
INTRAVENOUS | Status: AC
  Filled 2016-11-28: qty 100

## 2016-11-28 MED ORDER — METOCLOPRAMIDE HCL 5 MG/ML IJ SOLN: 5.0000 mg | Freq: Three times a day (TID) | INTRAMUSCULAR | Status: DC | PRN

## 2016-11-28 MED ORDER — BUPIVACAINE IN DEXTROSE 0.75-8.25 % IT SOLN
INTRATHECAL | Status: DC | PRN
  Administered 2016-11-28: 1.8 mL via INTRATHECAL

## 2016-11-28 SURGICAL SUPPLY — 51 items
BAG DECANTER FOR FLEXI CONT (MISCELLANEOUS) ×3 IMPLANT
BAG SPEC THK2 15X12 ZIP CLS (MISCELLANEOUS) ×1
BAG ZIPLOCK 12X15 (MISCELLANEOUS) ×3 IMPLANT
BANDAGE ACE 6X5 VEL STRL LF (GAUZE/BANDAGES/DRESSINGS) ×3 IMPLANT
BLADE SAG 18X100X1.27 (BLADE) ×3 IMPLANT
BLADE SAW SGTL 11.0X1.19X90.0M (BLADE) ×3 IMPLANT
BOWL SMART MIX CTS (DISPOSABLE) ×3 IMPLANT
CAP KNEE TOTAL 3 SIGMA ×3 IMPLANT
CEMENT HV SMART SET (Cement) ×6 IMPLANT
CLOSURE WOUND 1/2 X4 (GAUZE/BANDAGES/DRESSINGS) ×2
COVER SURGICAL LIGHT HANDLE (MISCELLANEOUS) ×3 IMPLANT
CUFF TOURN SGL QUICK 34 (TOURNIQUET CUFF) ×2
CUFF TRNQT CYL 34X4X40X1 (TOURNIQUET CUFF) ×1 IMPLANT
DECANTER SPIKE VIAL GLASS SM (MISCELLANEOUS) ×3 IMPLANT
DRAPE U-SHAPE 47X51 STRL (DRAPES) ×3 IMPLANT
DRSG ADAPTIC 3X8 NADH LF (GAUZE/BANDAGES/DRESSINGS) ×3 IMPLANT
DRSG PAD ABDOMINAL 8X10 ST (GAUZE/BANDAGES/DRESSINGS) ×3 IMPLANT
DURAPREP 26ML APPLICATOR (WOUND CARE) ×3 IMPLANT
ELECT REM PT RETURN 15FT ADLT (MISCELLANEOUS) ×3 IMPLANT
EVACUATOR 1/8 PVC DRAIN (DRAIN) ×3 IMPLANT
GAUZE SPONGE 4X4 12PLY STRL (GAUZE/BANDAGES/DRESSINGS) ×3 IMPLANT
GLOVE BIO SURGEON STRL SZ7.5 (GLOVE) ×3 IMPLANT
GLOVE BIO SURGEON STRL SZ8 (GLOVE) ×3 IMPLANT
GLOVE BIOGEL PI IND STRL 6.5 (GLOVE) ×1 IMPLANT
GLOVE BIOGEL PI IND STRL 8 (GLOVE) ×1 IMPLANT
GLOVE BIOGEL PI INDICATOR 6.5 (GLOVE) ×2
GLOVE BIOGEL PI INDICATOR 8 (GLOVE) ×2
GLOVE SURG SS PI 6.5 STRL IVOR (GLOVE) IMPLANT
GLOVE SURG SS PI 7.5 STRL IVOR (GLOVE) ×3 IMPLANT
GOWN STRL REUS W/TWL LRG LVL3 (GOWN DISPOSABLE) ×3 IMPLANT
GOWN STRL REUS W/TWL XL LVL3 (GOWN DISPOSABLE) ×3 IMPLANT
HANDPIECE INTERPULSE COAX TIP (DISPOSABLE) ×2
IMMOBILIZER KNEE 20 (SOFTGOODS) ×3
IMMOBILIZER KNEE 20 THIGH 36 (SOFTGOODS) ×1 IMPLANT
MANIFOLD NEPTUNE II (INSTRUMENTS) ×3 IMPLANT
NS IRRIG 1000ML POUR BTL (IV SOLUTION) ×3 IMPLANT
PACK TOTAL KNEE CUSTOM (KITS) ×3 IMPLANT
PADDING CAST COTTON 6X4 STRL (CAST SUPPLIES) ×6 IMPLANT
POSITIONER SURGICAL ARM (MISCELLANEOUS) ×3 IMPLANT
SET HNDPC FAN SPRY TIP SCT (DISPOSABLE) ×1 IMPLANT
STRIP CLOSURE SKIN 1/2X4 (GAUZE/BANDAGES/DRESSINGS) ×4 IMPLANT
SUT MNCRL AB 4-0 PS2 18 (SUTURE) ×3 IMPLANT
SUT STRATAFIX 0 PDS 27 VIOLET (SUTURE) ×3
SUT VIC AB 2-0 CT1 27 (SUTURE) ×6
SUT VIC AB 2-0 CT1 TAPERPNT 27 (SUTURE) ×3 IMPLANT
SUTURE STRATFX 0 PDS 27 VIOLET (SUTURE) ×1 IMPLANT
SYR 30ML LL (SYRINGE) ×3 IMPLANT
TRAY FOLEY W/METER SILVER 16FR (SET/KITS/TRAYS/PACK) ×3 IMPLANT
WATER STERILE IRR 1000ML POUR (IV SOLUTION) ×6 IMPLANT
WRAP KNEE MAXI GEL POST OP (GAUZE/BANDAGES/DRESSINGS) ×3 IMPLANT
YANKAUER SUCT BULB TIP 10FT TU (MISCELLANEOUS) ×3 IMPLANT

## 2016-11-28 NOTE — Discharge Instructions (Addendum)
° °Dr. Frank Aluisio °Total Joint Specialist °[facility] °[address] °[place], [place] [address] °[phone] ° °TOTAL KNEE REPLACEMENT POSTOPERATIVE DIRECTIONS ° °Knee Rehabilitation, Guidelines Following Surgery  °Results after knee surgery are often greatly improved when you follow the exercise, range of motion and muscle strengthening exercises prescribed by your doctor. Safety measures are also important to protect the knee from further injury. Any time any of these exercises cause you to have increased pain or swelling in your knee joint, decrease the amount until you are comfortable again and slowly increase them. If you have problems or questions, call your caregiver or physical therapist for advice.  ° °HOME CARE INSTRUCTIONS  °Remove items at home which could result in a fall. This includes throw rugs or furniture in walking pathways.  °· ICE to the affected knee every three hours for 30 minutes at a time and then as needed for pain and swelling.  Continue to use ice on the knee for pain and swelling from surgery. You may notice swelling that will progress down to the foot and ankle.  This is normal after surgery.  Elevate the leg when you are not up walking on it.   °· Continue to use the breathing machine which will help keep your temperature down.  It is common for your temperature to cycle up and down following surgery, especially at night when you are not up moving around and exerting yourself.  The breathing machine keeps your lungs expanded and your temperature down. °· Do not place pillow under knee, focus on keeping the knee straight while resting ° °DIET °You may resume your previous home diet once your are discharged from the hospital. ° °DRESSING / WOUND CARE / SHOWERING °You may shower 3 days after surgery, but keep the wounds dry during showering.  You may use an occlusive plastic wrap (Press'n Seal for example), NO SOAKING/SUBMERGING IN THE BATHTUB.  If the  bandage gets wet, change with a clean dry gauze.  If the incision gets wet, pat the wound dry with a clean towel. °You may start showering once you are discharged home but do not submerge the incision under water. Just pat the incision dry and apply a dry gauze dressing on daily. °Change the surgical dressing daily and reapply a dry dressing each time. ° °ACTIVITY °Walk with your walker as instructed. °Use walker as long as suggested by your caregivers. °Avoid periods of inactivity such as sitting longer than an hour when not asleep. This helps prevent blood clots.  °You may resume a sexual relationship in one month or when given the OK by your doctor.  °You may return to work once you are cleared by your doctor.  °Do not drive a car for 6 weeks or until released by you surgeon.  °Do not drive while taking narcotics. ° °WEIGHT BEARING °Weight bearing as tolerated with assist device (walker, cane, etc) as directed, use it as long as suggested by your surgeon or therapist, typically at least 4-6 weeks. ° °POSTOPERATIVE CONSTIPATION PROTOCOL °Constipation - defined medically as fewer than three stools per week and severe constipation as less than one stool per week. ° °One of the most common issues patients have following surgery is constipation.  Even if you have a regular bowel pattern at home, your normal regimen is likely to be disrupted due to multiple reasons following surgery.  Combination of anesthesia, postoperative narcotics, change in appetite and fluid intake all can affect your bowels.    In order to avoid complications following surgery, here are some recommendations in order to help you during your recovery period. ° °Colace (docusate) - Pick up an over-the-counter form of Colace or another stool softener and take twice a day as long as you are requiring postoperative pain medications.  Take with a full glass of water daily.  If you experience loose stools or diarrhea, hold the colace until you stool forms  back up.  If your symptoms do not get better within 1 week or if they get worse, check with your doctor. ° °Dulcolax (bisacodyl) - Pick up over-the-counter and take as directed by the product packaging as needed to assist with the movement of your bowels.  Take with a full glass of water.  Use this product as needed if not relieved by Colace only.  ° °MiraLax (polyethylene glycol) - Pick up over-the-counter to have on hand.  MiraLax is a solution that will increase the amount of water in your bowels to assist with bowel movements.  Take as directed and can mix with a glass of water, juice, soda, coffee, or tea.  Take if you go more than two days without a movement. °Do not use MiraLax more than once per day. Call your doctor if you are still constipated or irregular after using this medication for 7 days in a row. ° °If you continue to have problems with postoperative constipation, please contact the office for further assistance and recommendations.  If you experience "the worst abdominal pain ever" or develop nausea or vomiting, please contact the office immediatly for further recommendations for treatment. ° °ITCHING ° If you experience itching with your medications, try taking only a single pain pill, or even half a pain pill at a time.  You can also use Benadryl over the counter for itching or also to help with sleep.  ° °TED HOSE STOCKINGS °Wear the elastic stockings on both legs for three weeks following surgery during the day but you may remove then at night for sleeping. ° °MEDICATIONS °See your medication summary on the “After Visit Summary” that the nursing staff will review with you prior to discharge.  You may have some home medications which will be placed on hold until you complete the course of blood thinner medication.  It is important for you to complete the blood thinner medication as prescribed by your surgeon.  Continue your approved medications as instructed at time of  discharge. ° °PRECAUTIONS °If you experience chest pain or shortness of breath - call 911 immediately for transfer to the hospital emergency department.  °If you develop a fever greater that 101 F, purulent drainage from wound, increased redness or drainage from wound, foul odor from the wound/dressing, or calf pain - CONTACT YOUR SURGEON.   °                                                °FOLLOW-UP APPOINTMENTS °Make sure you keep all of your appointments after your operation with your surgeon and caregivers. You should call the office at the above phone number and make an appointment for approximately two weeks after the date of your surgery or on the date instructed by your surgeon outlined in the "After Visit Summary". ° ° °RANGE OF MOTION AND STRENGTHENING EXERCISES  °Rehabilitation of the knee is important following a knee injury or   an operation. After just a few days of immobilization, the muscles of the thigh which control the knee become weakened and shrink (atrophy). Knee exercises are designed to build up the tone and strength of the thigh muscles and to improve knee motion. Often times heat used for twenty to thirty minutes before working out will loosen up your tissues and help with improving the range of motion but do not use heat for the first two weeks following surgery. These exercises can be done on a training (exercise) mat, on the floor, on a table or on a bed. Use what ever works the best and is most comfortable for you Knee exercises include:  °Leg Lifts - While your knee is still immobilized in a splint or cast, you can do straight leg raises. Lift the leg to 60 degrees, hold for 3 sec, and slowly lower the leg. Repeat 10-20 times 2-3 times daily. Perform this exercise against resistance later as your knee gets better.  °Quad and Hamstring Sets - Tighten up the muscle on the front of the thigh (Quad) and hold for 5-10 sec. Repeat this 10-20 times hourly. Hamstring sets are done by pushing the  foot backward against an object and holding for 5-10 sec. Repeat as with quad sets.  °· Leg Slides: Lying on your back, slowly slide your foot toward your buttocks, bending your knee up off the floor (only go as far as is comfortable). Then slowly slide your foot back down until your leg is flat on the floor again. °· Angel Wings: Lying on your back spread your legs to the side as far apart as you can without causing discomfort.  °A rehabilitation program following serious knee injuries can speed recovery and prevent re-injury in the future due to weakened muscles. Contact your doctor or a physical therapist for more information on knee rehabilitation.  ° °IF YOU ARE TRANSFERRED TO A SKILLED REHAB FACILITY °If the patient is transferred to a skilled rehab facility following release from the hospital, a list of the current medications will be sent to the facility for the patient to continue.  When discharged from the skilled rehab facility, please have the facility set up the patient's Home Health Physical Therapy prior to being released. Also, the skilled facility will be responsible for providing the patient with their medications at time of release from the facility to include their pain medication, the muscle relaxants, and their blood thinner medication. If the patient is still at the rehab facility at time of the two week follow up appointment, the skilled rehab facility will also need to assist the patient in arranging follow up appointment in our office and any transportation needs. ° °MAKE SURE YOU:  °Understand these instructions.  °Get help right away if you are not doing well or get worse.  ° ° °Pick up stool softner and laxative for home use following surgery while on pain medications. °Do not submerge incision under water. °Please use good hand washing techniques while changing dressing each day. °May shower starting three days after surgery. °Please use a clean towel to pat the incision dry following  showers. °Continue to use ice for pain and swelling after surgery. °Do not use any lotions or creams on the incision until instructed by your surgeon. ° °Take Xarelto for two and a half more weeks following discharge from the hospital, then discontinue Xarelto. °Once the patient has completed the Xarelto, they may resume the 81 mg Aspirin. ° ° ° °Information on   my medicine - XARELTO® (Rivaroxaban) ° °Why was Xarelto® prescribed for you? °Xarelto® was prescribed for you to reduce the risk of blood clots forming after orthopedic surgery. The medical term for these abnormal blood clots is venous thromboembolism (VTE). ° °What do you need to know about xarelto® ? °Take your Xarelto® ONCE DAILY at the same time every day. °You may take it either with or without food. ° °If you have difficulty swallowing the tablet whole, you may crush it and mix in applesauce just prior to taking your dose. ° °Take Xarelto® exactly as prescribed by your doctor and DO NOT stop taking Xarelto® without talking to the doctor who prescribed the medication.  Stopping without other VTE prevention medication to take the place of Xarelto® may increase your risk of developing a clot. ° °After discharge, you should have regular check-up appointments with your healthcare provider that is prescribing your Xarelto®.   ° °What do you do if you miss a dose? °If you miss a dose, take it as soon as you remember on the same day then continue your regularly scheduled once daily regimen the next day. Do not take two doses of Xarelto® on the same day.  ° °Important Safety Information °A possible side effect of Xarelto® is bleeding. You should call your healthcare provider right away if you experience any of the following: °? Bleeding from an injury or your nose that does not stop. °? Unusual colored urine (red or dark brown) or unusual colored stools (red or black). °? Unusual bruising for unknown reasons. °? A serious fall or if you hit your head (even if  there is no bleeding). ° °Some medicines may interact with Xarelto® and might increase your risk of bleeding while on Xarelto®. To help avoid this, consult your healthcare provider or pharmacist prior to using any new prescription or non-prescription medications, including herbals, vitamins, non-steroidal anti-inflammatory drugs (NSAIDs) and supplements. ° °This website has more information on Xarelto®: www.xarelto.com. ° ° ° °

## 2016-11-28 NOTE — Anesthesia Procedure Notes (Signed)
Spinal  Patient location during procedure: OR End time: 11/28/2016 10:21 AM Staffing Resident/CRNA: Noralyn Pick D Performed: resident/CRNA  Preanesthetic Checklist Completed: patient identified, site marked, surgical consent, pre-op evaluation, timeout performed, IV checked, risks and benefits discussed and monitors and equipment checked Spinal Block Patient position: sitting Prep: Betadine Patient monitoring: heart rate, continuous pulse ox and blood pressure Approach: midline Location: L3-4 Injection technique: single-shot Needle Needle type: Sprotte  Needle gauge: 24 G Needle length: 9 cm Assessment Sensory level: T6 Additional Notes Expiration date of kit checked and confirmed. Patient tolerated procedure well, without complications.

## 2016-11-28 NOTE — Interval H&P Note (Signed)
History and Physical Interval Note:  11/28/2016 8:30 AM  [name]  has presented today for surgery, with the diagnosis of osteoarthritis Left knee  The various methods of treatment have been discussed with the patient and family. After consideration of risks, benefits and other options for treatment, the patient has consented to  Procedure(s): LEFT TOTAL KNEE ARTHROPLASTY (Left) as a surgical intervention .  The patient's history has been reviewed, patient examined, no change in status, stable for surgery.  I have reviewed the patient's chart and labs.  Questions were answered to the patient's satisfaction.     Gearlean Alf

## 2016-11-28 NOTE — Anesthesia Postprocedure Evaluation (Signed)
Anesthesia Post Note  Patient: Mallory Watts  Procedure(s) Performed: Procedure(s) (LRB): LEFT TOTAL KNEE ARTHROPLASTY (Left)     Patient location during evaluation: PACU Anesthesia Type: Spinal Level of consciousness: oriented and awake and alert Pain management: pain level controlled Vital Signs Assessment: post-procedure vital signs reviewed and stable Respiratory status: spontaneous breathing, respiratory function stable and patient connected to nasal cannula oxygen Cardiovascular status: blood pressure returned to baseline and stable Postop Assessment: no headache and no backache Anesthetic complications: no    Last Vitals:  Vitals:   11/28/16 0955 11/28/16 1000  BP: 133/66 123/71  Pulse: 73 77  Resp: 10 14  Temp:    SpO2: 100% 100%    Last Pain:  Vitals:   11/28/16 0819  TempSrc: Oral                 [name]

## 2016-11-28 NOTE — Transfer of Care (Signed)
Immediate Anesthesia Transfer of Care Note  Patient: Mallory Watts  Procedure(s) Performed: Procedure(s) with comments: LEFT TOTAL KNEE ARTHROPLASTY (Left) - Canal block  Patient Location: PACU  Anesthesia Type:Spinal and MAC combined with regional for post-op pain  Level of Consciousness: sedated and responds to stimulation  Airway & Oxygen Therapy: Patient Spontanous Breathing and Patient connected to face mask oxygen  Post-op Assessment: Report given to RN and Post -op Vital signs reviewed and stable  Post vital signs: Reviewed and stable  Last Vitals:  Vitals:   11/28/16 0955 11/28/16 1000  BP: 133/66 123/71  Pulse: 73 77  Resp: 10 14  Temp:    SpO2: 100% 100%    Last Pain:  Vitals:   11/28/16 0819  TempSrc: Oral      Patients Stated Pain Goal: 4 (67/12/45 8099)  Complications: No apparent anesthesia complications

## 2016-11-28 NOTE — Op Note (Signed)
OPERATIVE REPORT-TOTAL KNEE ARTHROPLASTY   Pre-operative diagnosis- Osteoarthritis  Left knee(s)  Post-operative diagnosis- Osteoarthritis Left knee(s)  Procedure-  Left  Total Knee Arthroplasty  Surgeon- Dione Plover. Allyana Vogan, MD  Assistant- Arlee Muslim, PA-C   Anesthesia-  Adductor canal block and spinal  EBL-* No blood loss amount entered *   Drains Hemovac  Tourniquet time-  Total Tourniquet Time Documented: Thigh (Left) - 32 minutes Total: Thigh (Left) - 32 minutes     Complications- None  Condition-PACU - hemodynamically stable.   Brief Clinical Note  Mallory Watts is a 76 y.o. year old female with end stage OA of her left knee with progressively worsening pain and dysfunction. She has constant pain, with activity and at rest and significant functional deficits with difficulties even with ADLs. She has had extensive non-op management including analgesics, injections of cortisone and viscosupplements, and home exercise program, but remains in significant pain with significant dysfunction. Radiographs show bone on bone arthritis medial and patellofemoral. She presents now for left Total Knee Arthroplasty.    Procedure in detail---   The patient is brought into the operating room and positioned supine on the operating table. After successful administration of  Adductor canal block and spinal,   a tourniquet is placed high on the  Left thigh(s) and the lower extremity is prepped and draped in the usual sterile fashion. Time out is performed by the operating team and then the  Left lower extremity is wrapped in Esmarch, knee flexed and the tourniquet inflated to 300 mmHg.       A midline incision is made with a ten blade through the subcutaneous tissue to the level of the extensor mechanism. A fresh blade is used to make a medial parapatellar arthrotomy. Soft tissue over the proximal medial tibia is subperiosteally elevated to the joint line with a knife and into the  semimembranosus bursa with a Cobb elevator. Soft tissue over the proximal lateral tibia is elevated with attention being paid to avoiding the patellar tendon on the tibial tubercle. The patella is everted, knee flexed 90 degrees and the ACL and PCL are removed. Findings are bone on bone medial and patellofemoral with large global osteophytes.        The drill is used to create a starting hole in the distal femur and the canal is thoroughly irrigated with sterile saline to remove the fatty contents. The 5 degree Left  valgus alignment guide is placed into the femoral canal and the distal femoral cutting block is pinned to remove 9 mm off the distal femur. Resection is made with an oscillating saw.      The tibia is subluxed forward and the menisci are removed. The extramedullary alignment guide is placed referencing proximally at the medial aspect of the tibial tubercle and distally along the second metatarsal axis and tibial crest. The block is pinned to remove 37mm off the more deficient medial  side. Resection is made with an oscillating saw. Size 2.5is the most appropriate size for the tibia and the proximal tibia is prepared with the modular drill and keel punch for that size.      The femoral sizing guide is placed and size 3 is most appropriate. Rotation is marked off the epicondylar axis and confirmed by creating a rectangular flexion gap at 90 degrees. The size 3 cutting block is pinned in this rotation and the anterior, posterior and chamfer cuts are made with the oscillating saw. The intercondylar block is then placed and  that cut is made.      Trial size 2.5 tibial component, trial size 3 posterior stabilized femur and a 12.5  mm posterior stabilized rotating platform insert trial is placed. Full extension is achieved with excellent varus/valgus and anterior/posterior balance throughout full range of motion. The patella is everted and thickness measured to be 22  mm. Free hand resection is taken to 12  mm, a 38 template is placed, lug holes are drilled, trial patella is placed, and it tracks normally. Osteophytes are removed off the posterior femur with the trial in place. All trials are removed and the cut bone surfaces prepared with pulsatile lavage. Cement is mixed and once ready for implantation, the size 2.5 tibial implant, size  3 posterior stabilized femoral component, and the size 38 patella are cemented in place and the patella is held with the clamp. The trial insert is placed and the knee held in full extension. The Exparel (20 ml mixed with 60 ml saline) is injected into the extensor mechanism, posterior capsule, medial and lateral gutters and subcutaneous tissues.  All extruded cement is removed and once the cement is hard the permanent 12.5 mm posterior stabilized rotating platform insert is placed into the tibial tray.      The wound is copiously irrigated with saline solution and the extensor mechanism closed over a hemovac drain with #1 V-loc suture. The tourniquet is released for a total tourniquet time of 32  minutes. Flexion against gravity is 140 degrees and the patella tracks normally. Subcutaneous tissue is closed with 2.0 vicryl and subcuticular with running 4.0 Monocryl. The incision is cleaned and dried and steri-strips and a bulky sterile dressing are applied. The limb is placed into a knee immobilizer and the patient is awakened and transported to recovery in stable condition.      Please note that a surgical assistant was a medical necessity for this procedure in order to perform it in a safe and expeditious manner. Surgical assistant was necessary to retract the ligaments and vital neurovascular structures to prevent injury to them and also necessary for proper positioning of the limb to allow for anatomic placement of the prosthesis.   Dione Plover Joncarlo Friberg, MD    11/28/2016, 11:22 AM

## 2016-11-28 NOTE — H&P (View-Only) (Signed)
Please note that a Potassium level has been added to be drawn immediately upon arrival on Monday morning prior to surgery to recheck potassium level. Arlee Muslim, PA-C

## 2016-11-28 NOTE — Evaluation (Signed)
Physical Therapy Evaluation Patient Details Name: Mallory Watts MRN: [id number] DOB: Nov 02, 1940 Today's Date: 11/28/2016   History of Present Illness  Pt is s/p L TKA   Clinical Impression  Pt is s/p the surgery listed above resulting in functional limitations due to the deficits listed below. Pt will benefit from skilled PT to increase their independence and safety with mobility to allow discharge. Pt able to ambulate 40 ft POD 0 and plans to discharge Wednesday.     Follow Up Recommendations Outpatient PT;DC plan and follow up therapy as arranged by surgeon    Equipment Recommendations  Rolling walker with 5" wheels (youth)    Recommendations for Other Services       Precautions / Restrictions Precautions Precautions: Knee;Fall Required Braces or Orthoses: Knee Immobilizer - Left Knee Immobilizer - Left: Discontinue once straight leg raise with < 10 degree lag Restrictions Weight Bearing Restrictions: No Other Position/Activity Restrictions: WBAT      Mobility  Bed Mobility Overal bed mobility: Needs Assistance Bed Mobility: Supine to Sit     Supine to sit: HOB elevated;Min guard     General bed mobility comments: Increased time, pt able to manage LE to sit at EOB   Transfers Overall transfer level: Needs assistance Equipment used: Rolling walker (2 wheeled) Transfers: Sit to/from Stand Sit to Stand: Min assist         General transfer comment: Pt requires min A to rise and steady trunk, verbal cues for hand placement   Ambulation/Gait Ambulation/Gait assistance: Min guard Ambulation Distance (Feet): 40 Feet Assistive device: Rolling walker (2 wheeled) Gait Pattern/deviations: Step-to pattern;Decreased stride length;Decreased stance time - left Gait velocity: decr   General Gait Details: Pt provided with verbal cues for sequencing with RW, pt reports pain increase with ambulation  Stairs            Wheelchair Mobility    Modified Rankin  (Stroke Patients Only)       Balance                                             Pertinent Vitals/Pain Pain Assessment: 0-10 (pain elevated to 8/10 after ambulation) Pain Score: 8  Pain Location: L Knee Pain Descriptors / Indicators: Sore;Aching;Discomfort Pain Intervention(s): Limited activity within patient's tolerance;Repositioned;Monitored during session;Premedicated before session    Home Living Family/patient expects to be discharged to:: Private residence Living Arrangements: Spouse/significant other Available Help at Discharge: Family Type of Home: House Home Access: Stairs to enter Entrance Stairs-Rails: None Entrance Stairs-Number of Steps: 5 total Home Layout: Able to live on main level with bedroom/bathroom Home Equipment: Bedside commode Additional Comments: Borrowing a BSC, may need a youth size     Prior Function Level of Independence: Independent               Hand Dominance        Extremity/Trunk Assessment   Upper Extremity Assessment Upper Extremity Assessment: Overall WFL for tasks assessed    Lower Extremity Assessment Lower Extremity Assessment: LLE deficits/detail LLE Deficits / Details: Able to perform SLR, 30 degrees AROM flexion at L knee       Communication   Communication: No difficulties  Cognition Arousal/Alertness: Awake/alert Behavior During Therapy: WFL for tasks assessed/performed Overall Cognitive Status: Within Functional Limits for tasks assessed  General Comments      Exercises     Assessment/Plan    PT Assessment Patient needs continued PT services  PT Problem List Decreased strength;Decreased mobility;Decreased safety awareness;Decreased range of motion;Decreased knowledge of precautions;Decreased activity tolerance;Decreased balance;Decreased knowledge of use of DME;Pain       PT Treatment Interventions DME instruction;Therapeutic  activities;Gait training;Therapeutic exercise;Patient/family education;Stair training;Balance training;Functional mobility training    PT Goals (Current goals can be found in the Care Plan section)  Acute Rehab PT Goals Patient Stated Goal: Pt would like to return home  PT Goal Formulation: With patient Time For Goal Achievement: 12/05/16 Potential to Achieve Goals: Good    Frequency 7X/week   Barriers to discharge        Co-evaluation               AM-PAC PT "6 Clicks" Daily Activity  Outcome Measure Difficulty turning over in bed (including adjusting bedclothes, sheets and blankets)?: A Little Difficulty moving from lying on back to sitting on the side of the bed? : A Little Difficulty sitting down on and standing up from a chair with arms (e.g., wheelchair, bedside commode, etc,.)?: Unable Help needed moving to and from a bed to chair (including a wheelchair)?: A Lot Help needed walking in hospital room?: A Little Help needed climbing 3-5 steps with a railing? : A Little 6 Click Score: 15    End of Session Equipment Utilized During Treatment: Gait belt;Left knee immobilizer Activity Tolerance: Patient tolerated treatment well Patient left: in chair;with chair alarm set;with call bell/phone within reach;with family/visitor present Nurse Communication: Mobility status PT Visit Diagnosis: Difficulty in walking, not elsewhere classified (R26.2)    Time: 5638-9373 PT Time Calculation (min) (ACUTE ONLY): 19 min   Charges:   PT Evaluation $PT Eval Low Complexity: 1 Low     PT G CodesOlegario Shearer, SPT   Reino Bellis 11/28/2016, 5:43 PM

## 2016-11-28 NOTE — Anesthesia Procedure Notes (Addendum)
Anesthesia Regional Block: Adductor canal block   Pre-Anesthetic Checklist: ,, timeout performed, Correct Patient, Correct Site, Correct Laterality, Correct Procedure, Correct Position, site marked, Risks and benefits discussed,  Surgical consent,  Pre-op evaluation,  At surgeon's request and post-op pain management  Laterality: Left  Prep: chloraprep       Needles:  Injection technique: Single-shot  Needle Type: Echogenic Stimulator Needle     Needle Length: 5cm  Needle Gauge: 22     Additional Needles:   Procedures: ultrasound guided, nerve stimulator,,,,,,  Narrative:  Start time: 11/28/2016 8:48 AM End time: 11/28/2016 9:55 AM Injection made incrementally with aspirations every 5 mL.  Performed by: Personally  Anesthesiologist: Mireyah Chervenak  Additional Notes: Functioning IV was confirmed and monitors were applied.  A 57mm 22ga Arrow echogenic stimulator needle was used. Sterile prep and drape,hand hygiene and sterile gloves were used. Ultrasound guidance: relevant anatomy identified, needle position confirmed, local anesthetic spread visualized around nerve(s)., vascular puncture avoided.  Image printed for medical record. Negative aspiration and negative test dose prior to incremental administration of local anesthetic. The patient tolerated the procedure well.

## 2016-11-29 LAB — CBC
HEMATOCRIT: 35 % — AB (ref 36.0–46.0)
Hemoglobin: 11.8 g/dL — ABNORMAL LOW (ref 12.0–15.0)
MCH: 30.8 pg (ref 26.0–34.0)
MCHC: 33.7 g/dL (ref 30.0–36.0)
MCV: 91.4 fL (ref 78.0–100.0)
Platelets: 190 10*3/uL (ref 150–400)
RBC: 3.83 MIL/uL — AB (ref 3.87–5.11)
RDW: 13.7 % (ref 11.5–15.5)
WBC: 12.1 10*3/uL — AB (ref 4.0–10.5)

## 2016-11-29 LAB — BASIC METABOLIC PANEL
ANION GAP: 7 (ref 5–15)
BUN: 19 mg/dL (ref 6–20)
CHLORIDE: 105 mmol/L (ref 101–111)
CO2: 25 mmol/L (ref 22–32)
Calcium: 8.1 mg/dL — ABNORMAL LOW (ref 8.9–10.3)
Creatinine, Ser: 0.96 mg/dL (ref 0.44–1.00)
GFR calc Af Amer: >60 mL/min (ref >60)
GFR calc non Af Amer: 57 mL/min — ABNORMAL LOW (ref >60)
GLUCOSE: 140 mg/dL — AB (ref 65–99)
POTASSIUM: 3.4 mmol/L — AB (ref 3.5–5.1)
Sodium: 137 mmol/L (ref 135–145)

## 2016-11-29 MED ORDER — HYDROCODONE-ACETAMINOPHEN 7.5-325 MG PO TABS: 1.0000 | ORAL_TABLET | ORAL | 0 refills | Status: DC | PRN

## 2016-11-29 MED ORDER — ESOMEPRAZOLE MAGNESIUM 40 MG PO CPDR
40.0000 mg | DELAYED_RELEASE_CAPSULE | Freq: Every day | ORAL | Status: DC
  Administered 2016-11-30: 12:00:00 40 mg via ORAL
  Filled 2016-11-29: qty 1

## 2016-11-29 MED ORDER — RIVAROXABAN 10 MG PO TABS: 10.0000 mg | ORAL_TABLET | Freq: Every day | ORAL | 0 refills | Status: DC

## 2016-11-29 MED ORDER — METHOCARBAMOL 500 MG PO TABS: 500.0000 mg | ORAL_TABLET | Freq: Four times a day (QID) | ORAL | 0 refills | Status: DC | PRN

## 2016-11-29 MED ORDER — ALUM & MAG HYDROXIDE-SIMETH 200-200-20 MG/5ML PO SUSP
30.0000 mL | ORAL | Status: DC | PRN
  Administered 2016-11-29: 30 mL via ORAL
  Filled 2016-11-29: qty 30

## 2016-11-29 MED ORDER — NON FORMULARY
40.0000 mg | Freq: Every day | Status: DC
Start: 2016-11-30 — End: 2016-11-29

## 2016-11-29 MED ORDER — TRAMADOL HCL 50 MG PO TABS: 50.0000 mg | ORAL_TABLET | Freq: Four times a day (QID) | ORAL | 0 refills | Status: DC | PRN

## 2016-11-29 NOTE — Progress Notes (Signed)
   Subjective: 1 Day Post-Op Procedure(s) (LRB): LEFT TOTAL KNEE ARTHROPLASTY (Left) Patient reports pain as mild.   Patient seen in rounds for Dr. Wynelle Link. Not much sleep last night. Patient is well, but has had some minor complaints of pain in the knee, requiring pain medications We will start therapy today.  Plan is to go Home after hospital stay.  Objective: Vital signs in last 24 hours: Temp:  [97.4 F (36.3 C)-98.4 F (36.9 C)] 97.4 F (36.3 C) (08/21 2138) Pulse Rate:  [66-82] 80 (08/21 2138) Resp:  [15-18] 15 (08/21 2138) BP: (128-151)/(64-81) 150/64 (08/21 2138) SpO2:  [92 %-99 %] 96 % (08/21 2138)  Intake/Output from previous day:  Intake/Output Summary (Last 24 hours) at 11/29/16 2206 Last data filed at 11/29/16 2137  Gross per 24 hour  Intake           2162.5 ml  Output             1160 ml  Net           1002.5 ml    Intake/Output this shift: Total I/O In: 200 [P.O.:200] Out: -   Labs:  Recent Labs  11/29/16 0745  HGB 11.8*    Recent Labs  11/29/16 0745  WBC 12.1*  RBC 3.83*  HCT 35.0*  PLT 190    Recent Labs  11/28/16 0900 11/29/16 0745  NA 140 137  K 3.4* 3.4*  CL 107 105  CO2 26 25  BUN 22* 19  CREATININE 1.05* 0.96  GLUCOSE 107* 140*  CALCIUM 8.7* 8.1*   No results for input(s): LABPT, INR in the last 72 hours.  EXAM General - Patient is Alert, Appropriate and Oriented Extremity - Neurovascular intact Sensation intact distally Intact pulses distally Dorsiflexion/Plantar flexion intact Dressing - dressing C/D/I Motor Function - intact, moving foot and toes well on exam.  Hemovac pulled without difficulty.  Past Medical History:  Diagnosis Date  . Arthritis   . Atrial fibrillation ([place])    not symptomatic -on metoprolol BID for this   . Cancer ([place]) 1998   breast cancer--left  . Dysrhythmia    atrial flutter/fib  . GERD (gastroesophageal reflux disease)   . Hypertension   . Osteopenia   . Personal history of  chemotherapy 1998  . Personal history of radiation therapy 1998  . PONV (postoperative nausea and vomiting)   . Shortness of breath    exertion  . STD (sexually transmitted disease)    HSV  . Urinary incontinence     Assessment/Plan: 1 Day Post-Op Procedure(s) (LRB): LEFT TOTAL KNEE ARTHROPLASTY (Left) Principal Problem:   OA (osteoarthritis) of knee  Estimated body mass index is 31.18 kg/m as calculated from the following:   Height as of this encounter: 5\' 1"  (1.549 m).   Weight as of this encounter: 74.8 kg (165 lb). Up with therapy Plan for discharge tomorrow  DVT Prophylaxis - Xarelto Weight-Bearing as tolerated to left leg D/C O2 and Pulse OX and try on Room Air  Arlee Muslim, PA-C Orthopaedic Surgery 11/29/2016, 10:06 PM

## 2016-11-29 NOTE — Progress Notes (Signed)
Physical Therapy Treatment Patient Details Name: Mallory Watts MRN: [id number] DOB: 1940/09/11 Today's Date: 11/29/2016    History of Present Illness s/p L TKA    PT Comments    Pt limited this afternoon by pain. Pt reports increased pain and nausea this afternoon. Pt able to ambulate 40 feet before requesting to be brought back to the bed.   Follow Up Recommendations  Outpatient PT;DC plan and follow up therapy as arranged by surgeon     Equipment Recommendations  Rolling walker with 5" wheels    Recommendations for Other Services       Precautions / Restrictions Precautions Precautions: Fall;Knee Required Braces or Orthoses: Knee Immobilizer - Left Knee Immobilizer - Left: Discontinue once straight leg raise with < 10 degree lag Restrictions Weight Bearing Restrictions: No Other Position/Activity Restrictions: WBAT    Mobility  Bed Mobility Overal bed mobility: Needs Assistance Bed Mobility: Supine to Sit;Sit to Supine     Supine to sit: HOB elevated;Min guard Sit to supine: Min guard   General bed mobility comments: Increased time, pt able to manage LE with bed mobility  Transfers Overall transfer level: Needs assistance Equipment used: Rolling walker (2 wheeled) Transfers: Sit to/from Stand Sit to Stand: Min assist         General transfer comment: Pt requires min A to rise and steady trunk, verbal cues for hand placement   Ambulation/Gait Ambulation/Gait assistance: Min guard Ambulation Distance (Feet): 40 Feet Assistive device: Rolling walker (2 wheeled) Gait Pattern/deviations: Decreased stride length;Step-through pattern;Decreased stance time - left Gait velocity: decr   General Gait Details: Pt provided with verbal cues to roll and not lift RW, pt reports increased pain and nausea this afternoon    Stairs            Wheelchair Mobility    Modified Rankin (Stroke Patients Only)       Balance                                            Cognition Arousal/Alertness: Awake/alert Behavior During Therapy: WFL for tasks assessed/performed Overall Cognitive Status: Within Functional Limits for tasks assessed                                        Exercises     General Comments        Pertinent Vitals/Pain Pain Assessment: 0-10 Pain Score: 5  Pain Location: L Knee Pain Descriptors / Indicators: Sore Pain Intervention(s): Limited activity within patient's tolerance;Repositioned;Monitored during session;Premedicated before session    Home Living                      Prior Function Level of Independence: Independent          PT Goals (current goals can now be found in the care plan section) Acute Rehab PT Goals Patient Stated Goal: Pt would like to return home  Progress towards PT goals: Progressing toward goals    Frequency    7X/week      PT Plan Current plan remains appropriate    Co-evaluation              AM-PAC PT "6 Clicks" Daily Activity  Outcome Measure  Difficulty turning over in bed (including adjusting bedclothes, sheets  and blankets)?: None Difficulty moving from lying on back to sitting on the side of the bed? : A Little Difficulty sitting down on and standing up from a chair with arms (e.g., wheelchair, bedside commode, etc,.)?: Unable Help needed moving to and from a bed to chair (including a wheelchair)?: A Little Help needed walking in hospital room?: A Little Help needed climbing 3-5 steps with a railing? : A Little 6 Click Score: 17    End of Session Equipment Utilized During Treatment: Gait belt;Left knee immobilizer Activity Tolerance: Patient tolerated treatment well Patient left: in bed;with bed alarm set;with call bell/phone within reach Nurse Communication: Mobility status PT Visit Diagnosis: Difficulty in walking, not elsewhere classified (R26.2)     Time: 0158-6825 PT Time Calculation (min) (ACUTE ONLY): 19  min  Charges:  $Gait Training: 8-22 mins                     G Codes:       [name], SPT    Reino Bellis 11/29/2016, 3:16 PM

## 2016-11-29 NOTE — Progress Notes (Signed)
Discharge planning, plan for OP PT, needs RW, has 3n1. Contacted [facility] to deliver to room. [phone]

## 2016-11-29 NOTE — Discharge Summary (Signed)
Physician Discharge Summary   Patient ID: Mallory Watts MRN: [id number] DOB/AGE: 1940/09/06 76 y.o.  Admit date: 11/28/2016 Discharge date: 11/30/2016  Primary Diagnosis:Osteoarthritis  Left knee(s)    Admission Diagnoses:  Past Medical History:  Diagnosis Date  . Arthritis   . Atrial fibrillation ([place])    not symptomatic -on metoprolol BID for this   . Cancer ([place]) 1998   breast cancer--left  . Dysrhythmia    atrial flutter/fib  . GERD (gastroesophageal reflux disease)   . Hypertension   . Osteopenia   . Personal history of chemotherapy 1998  . Personal history of radiation therapy 1998  . PONV (postoperative nausea and vomiting)   . Shortness of breath    exertion  . STD (sexually transmitted disease)    HSV  . Urinary incontinence    Discharge Diagnoses:   Principal Problem:   OA (osteoarthritis) of knee  Estimated body mass index is 31.18 kg/m as calculated from the following:   Height as of this encounter: 5' 1"  (1.549 m).   Weight as of this encounter: 74.8 kg (165 lb).  Procedure:  Procedure(s) (LRB): LEFT TOTAL KNEE ARTHROPLASTY (Left)   Consults: None  HPI: Mallory Watts is a 76 y.o. year old female with end stage OA of her left knee with progressively worsening pain and dysfunction. She has constant pain, with activity and at rest and significant functional deficits with difficulties even with ADLs. She has had extensive non-op management including analgesics, injections of cortisone and viscosupplements, and home exercise program, but remains in significant pain with significant dysfunction. Radiographs show bone on bone arthritis medial and patellofemoral. She presents now for left Total Knee Arthroplasty.   Laboratory Data: Admission on 11/28/2016, Discharged on 11/30/2016  Component Date Value Ref Range Status  . Sodium 11/28/2016 140  135 - 145 mmol/L Final  . Potassium 11/28/2016 3.4* 3.5 - 5.1 mmol/L Final  . Chloride 11/28/2016 107  101 -  111 mmol/L Final  . CO2 11/28/2016 26  22 - 32 mmol/L Final  . Glucose, Bld 11/28/2016 107* 65 - 99 mg/dL Final  . BUN 11/28/2016 22* 6 - 20 mg/dL Final  . Creatinine, Ser 11/28/2016 1.05* 0.44 - 1.00 mg/dL Final  . Calcium 11/28/2016 8.7* 8.9 - 10.3 mg/dL Final  . GFR calc non Af Amer 11/28/2016 51* >60 mL/min Final  . GFR calc Af Amer 11/28/2016 60* >60 mL/min Final   Comment: (NOTE) The eGFR has been calculated using the CKD EPI equation. This calculation has not been validated in all clinical situations. eGFR's persistently <60 mL/min signify possible Chronic Kidney Disease.   . Anion gap 11/28/2016 7  5 - 15 Final  . WBC 11/29/2016 12.1* 4.0 - 10.5 K/uL Final  . RBC 11/29/2016 3.83* 3.87 - 5.11 MIL/uL Final  . Hemoglobin 11/29/2016 11.8* 12.0 - 15.0 g/dL Final  . HCT 11/29/2016 35.0* 36.0 - 46.0 % Final  . MCV 11/29/2016 91.4  78.0 - 100.0 fL Final  . MCH 11/29/2016 30.8  26.0 - 34.0 pg Final  . MCHC 11/29/2016 33.7  30.0 - 36.0 g/dL Final  . RDW 11/29/2016 13.7  11.5 - 15.5 % Final  . Platelets 11/29/2016 190  150 - 400 K/uL Final  . Sodium 11/29/2016 137  135 - 145 mmol/L Final  . Potassium 11/29/2016 3.4* 3.5 - 5.1 mmol/L Final  . Chloride 11/29/2016 105  101 - 111 mmol/L Final  . CO2 11/29/2016 25  22 - 32 mmol/L Final  .  Glucose, Bld 11/29/2016 140* 65 - 99 mg/dL Final  . BUN 11/29/2016 19  6 - 20 mg/dL Final  . Creatinine, Ser 11/29/2016 0.96  0.44 - 1.00 mg/dL Final  . Calcium 11/29/2016 8.1* 8.9 - 10.3 mg/dL Final  . GFR calc non Af Amer 11/29/2016 57* >60 mL/min Final  . GFR calc Af Amer 11/29/2016 >60  >60 mL/min Final   Comment: (NOTE) The eGFR has been calculated using the CKD EPI equation. This calculation has not been validated in all clinical situations. eGFR's persistently <60 mL/min signify possible Chronic Kidney Disease.   . Anion gap 11/29/2016 7  5 - 15 Final  . WBC 11/30/2016 11.8* 4.0 - 10.5 K/uL Final  . RBC 11/30/2016 3.43* 3.87 - 5.11 MIL/uL  Final  . Hemoglobin 11/30/2016 10.6* 12.0 - 15.0 g/dL Final  . HCT 11/30/2016 31.3* 36.0 - 46.0 % Final  . MCV 11/30/2016 91.3  78.0 - 100.0 fL Final  . MCH 11/30/2016 30.9  26.0 - 34.0 pg Final  . MCHC 11/30/2016 33.9  30.0 - 36.0 g/dL Final  . RDW 11/30/2016 13.9  11.5 - 15.5 % Final  . Platelets 11/30/2016 176  150 - 400 K/uL Final  . Sodium 11/30/2016 136  135 - 145 mmol/L Final  . Potassium 11/30/2016 3.9  3.5 - 5.1 mmol/L Final  . Chloride 11/30/2016 105  101 - 111 mmol/L Final  . CO2 11/30/2016 26  22 - 32 mmol/L Final  . Glucose, Bld 11/30/2016 110* 65 - 99 mg/dL Final  . BUN 11/30/2016 18  6 - 20 mg/dL Final  . Creatinine, Ser 11/30/2016 0.89  0.44 - 1.00 mg/dL Final  . Calcium 11/30/2016 8.0* 8.9 - 10.3 mg/dL Final  . GFR calc non Af Amer 11/30/2016 >60  >60 mL/min Final  . GFR calc Af Amer 11/30/2016 >60  >60 mL/min Final   Comment: (NOTE) The eGFR has been calculated using the CKD EPI equation. This calculation has not been validated in all clinical situations. eGFR's persistently <60 mL/min signify possible Chronic Kidney Disease.   Georgiann Hahn gap 11/30/2016 5  5 - 15 Final  Hospital Outpatient Visit on 11/21/2016  Component Date Value Ref Range Status  . aPTT 11/21/2016 29  24 - 36 seconds Final  . WBC 11/21/2016 5.9  4.0 - 10.5 K/uL Final  . RBC 11/21/2016 4.43  3.87 - 5.11 MIL/uL Final  . Hemoglobin 11/21/2016 13.3  12.0 - 15.0 g/dL Final  . HCT 11/21/2016 39.9  36.0 - 46.0 % Final  . MCV 11/21/2016 90.1  78.0 - 100.0 fL Final  . MCH 11/21/2016 30.0  26.0 - 34.0 pg Final  . MCHC 11/21/2016 33.3  30.0 - 36.0 g/dL Final  . RDW 11/21/2016 13.7  11.5 - 15.5 % Final  . Platelets 11/21/2016 204  150 - 400 K/uL Final  . Sodium 11/21/2016 142  135 - 145 mmol/L Final  . Potassium 11/21/2016 3.3* 3.5 - 5.1 mmol/L Final  . Chloride 11/21/2016 106  101 - 111 mmol/L Final  . CO2 11/21/2016 28  22 - 32 mmol/L Final  . Glucose, Bld 11/21/2016 105* 65 - 99 mg/dL Final  . BUN  11/21/2016 26* 6 - 20 mg/dL Final  . Creatinine, Ser 11/21/2016 1.22* 0.44 - 1.00 mg/dL Final  . Calcium 11/21/2016 8.9  8.9 - 10.3 mg/dL Final  . Total Protein 11/21/2016 6.6  6.5 - 8.1 g/dL Final  . Albumin 11/21/2016 3.9  3.5 - 5.0 g/dL Final  .  AST 11/21/2016 19  15 - 41 U/L Final  . ALT 11/21/2016 17  14 - 54 U/L Final  . Alkaline Phosphatase 11/21/2016 50  38 - 126 U/L Final  . Total Bilirubin 11/21/2016 0.8  0.3 - 1.2 mg/dL Final  . GFR calc non Af Amer 11/21/2016 43* >60 mL/min Final  . GFR calc Af Amer 11/21/2016 50* >60 mL/min Final   Comment: (NOTE) The eGFR has been calculated using the CKD EPI equation. This calculation has not been validated in all clinical situations. eGFR's persistently <60 mL/min signify possible Chronic Kidney Disease.   . Anion gap 11/21/2016 8  5 - 15 Final  . Prothrombin Time 11/21/2016 13.1  11.4 - 15.2 seconds Final  . INR 11/21/2016 0.99   Final  . ABO/RH(D) 11/21/2016 A POS   Final  . Antibody Screen 11/21/2016 NEG   Final  . Sample Expiration 11/21/2016 12/01/2016   Final  . Extend sample reason 11/21/2016 NO TRANSFUSIONS OR PREGNANCY IN THE PAST 3 MONTHS   Final  . MRSA, PCR 11/21/2016 NEGATIVE  NEGATIVE Final  . Staphylococcus aureus 11/21/2016 POSITIVE* NEGATIVE Final   Comment:        The Xpert SA Assay (FDA approved for NASAL specimens in patients over 82 years of age), is one component of a comprehensive surveillance program.  Test performance has been validated by [facility] for patients greater than or equal to 100 year old. It is not intended to diagnose infection nor to guide or monitor treatment.   . ABO/RH(D) 11/21/2016 A POS   Final     X-Rays:No results found.  EKG: Orders placed or performed during the hospital encounter of 01/13/14  . EKG 12 lead  . EKG 12 lead     Hospital Course: Mallory Watts is a 76 y.o. who was admitted to [facility]. They were brought to the operating room on 11/28/2016 and  underwent Procedure(s): LEFT TOTAL KNEE ARTHROPLASTY.  Patient tolerated the procedure well and was later transferred to the recovery room and then to the orthopaedic floor for postoperative care.  They were given PO and IV analgesics for pain control following their surgery.  They were given 24 hours of postoperative antibiotics of  Anti-infectives    Start     Dose/Rate Route Frequency Ordered Stop   11/28/16 1630  ceFAZolin (ANCEF) IVPB 2g/100 mL premix     2 g 200 mL/hr over 30 Minutes Intravenous Every 6 hours 11/28/16 1322 11/28/16 2218   11/28/16 0839  ceFAZolin (ANCEF) 2-4 GM/100ML-% IVPB    Comments:  Waldron Session   : cabinet override      11/28/16 3267 11/28/16 1022   11/28/16 0835  ceFAZolin (ANCEF) IVPB 2g/100 mL premix     2 g 200 mL/hr over 30 Minutes Intravenous On call to O.R. 11/28/16 1245 11/28/16 1022     and started on DVT prophylaxis in the form of Xarelto.   PT and OT were ordered for total joint protocol.  Discharge planning consulted to help with postop disposition and equipment needs.  Patient had a tough night on the evening of surgery with no sleep.  They started to get up OOB with therapy on day one. Hemovac drain was pulled without difficulty.  Continued to work with therapy into day two.  Dressing was changed on day two and the incision was healing well.  Patient was seen in rounds on POD 2 and was ready to go home.   Diet -  Cardiac diet Follow up - in 2 weeks Activity - WBAT Disposition - Home Condition Upon Discharge - Stable D/C Meds - See DC Summary DVT Prophylaxis - Xarelto  Discharge Instructions    Call MD / Call 911    Complete by:  As directed    If you experience chest pain or shortness of breath, CALL 911 and be transported to the hospital emergency room.  If you develope a fever above 101 F, pus (white drainage) or increased drainage or redness at the wound, or calf pain, call your surgeon's office.   Change dressing    Complete by:  As  directed    Change dressing daily with sterile 4 x 4 inch gauze dressing and apply TED hose. Do not submerge the incision under water.   Constipation Prevention    Complete by:  As directed    Drink plenty of fluids.  Prune juice may be helpful.  You may use a stool softener, such as Colace (over the counter) 100 mg twice a day.  Use MiraLax (over the counter) for constipation as needed.   Diet - low sodium heart healthy    Complete by:  As directed    Discharge instructions    Complete by:  As directed    Take Xarelto for two and a half more weeks, then discontinue Xarelto. Once the patient has completed the Xarelto, they may resume the 81 mg Aspirin.   Pick up stool softner and laxative for home use following surgery while on pain medications. Do not submerge incision under water. Please use good hand washing techniques while changing dressing each day. May shower starting three days after surgery. Please use a clean towel to pat the incision dry following showers. Continue to use ice for pain and swelling after surgery. Do not use any lotions or creams on the incision until instructed by your surgeon.  Wear both TED hose on both legs during the day every day for three weeks, but may remove the TED hose at night at home.  Postoperative Constipation Protocol  Constipation - defined medically as fewer than three stools per week and severe constipation as less than one stool per week.  One of the most common issues patients have following surgery is constipation.  Even if you have a regular bowel pattern at home, your normal regimen is likely to be disrupted due to multiple reasons following surgery.  Combination of anesthesia, postoperative narcotics, change in appetite and fluid intake all can affect your bowels.  In order to avoid complications following surgery, here are some recommendations in order to help you during your recovery period.  Colace (docusate) - Pick up an  over-the-counter form of Colace or another stool softener and take twice a day as long as you are requiring postoperative pain medications.  Take with a full glass of water daily.  If you experience loose stools or diarrhea, hold the colace until you stool forms back up.  If your symptoms do not get better within 1 week or if they get worse, check with your doctor.  Dulcolax (bisacodyl) - Pick up over-the-counter and take as directed by the product packaging as needed to assist with the movement of your bowels.  Take with a full glass of water.  Use this product as needed if not relieved by Colace only.   MiraLax (polyethylene glycol) - Pick up over-the-counter to have on hand.  MiraLax is a solution that will increase the amount of water in your  bowels to assist with bowel movements.  Take as directed and can mix with a glass of water, juice, soda, coffee, or tea.  Take if you go more than two days without a movement. Do not use MiraLax more than once per day. Call your doctor if you are still constipated or irregular after using this medication for 7 days in a row.  If you continue to have problems with postoperative constipation, please contact the office for further assistance and recommendations.  If you experience "the worst abdominal pain ever" or develop nausea or vomiting, please contact the office immediatly for further recommendations for treatment.   Do not put a pillow under the knee. Place it under the heel.    Complete by:  As directed    Do not sit on low chairs, stoools or toilet seats, as it may be difficult to get up from low surfaces    Complete by:  As directed    Driving restrictions    Complete by:  As directed    No driving until released by the physician.   Increase activity slowly as tolerated    Complete by:  As directed    Lifting restrictions    Complete by:  As directed    No lifting until released by the physician.   Patient may shower    Complete by:  As directed     You may shower without a dressing once there is no drainage.  Do not wash over the wound.  If drainage remains, do not shower until drainage stops.   TED hose    Complete by:  As directed    Use stockings (TED hose) for 3 weeks on both leg(s).  You may remove them at night for sleeping.   Weight bearing as tolerated    Complete by:  As directed    Laterality:  left   Extremity:  Lower     Allergies as of 11/30/2016      Reactions   Gadolinium Nausea Only, Other (See Comments)    Desc: PATIENT FELT DIZZY AND NAUSEOUS AFTER GAD INJECTION. NO VOMITING,NO HIVES, NO RASH., Onset Date: [id number]   Sulfonamide Derivatives Hives      Medication List    STOP taking these medications   alendronate 70 MG tablet Commonly known as:  FOSAMAX   aspirin 81 MG tablet   meloxicam 15 MG tablet Commonly known as:  MOBIC   Vitamin D 400 units capsule     TAKE these medications   acetaminophen 500 MG tablet Commonly known as:  TYLENOL Take 500-1,000 mg by mouth daily as needed for moderate pain.   acetaminophen 650 MG CR tablet Commonly known as:  TYLENOL Take 1,300 mg by mouth 2 (two) times daily as needed for pain.   atorvastatin 80 MG tablet Commonly known as:  LIPITOR Take 80 mg by mouth daily.   esomeprazole 40 MG capsule Commonly known as:  NEXIUM Take 40 mg by mouth daily.   eszopiclone 2 MG Tabs tablet Commonly known as:  LUNESTA Take 1-2 mg by mouth at bedtime as needed for sleep. Take immediately before bedtime   fexofenadine 180 MG tablet Commonly known as:  ALLEGRA Take 180 mg by mouth daily.   HYDROcodone-acetaminophen 7.5-325 MG tablet Commonly known as:  NORCO Take 1-2 tablets by mouth every 4 (four) hours as needed for moderate pain or severe pain (breakthrough pain).   Magnesium 250 MG Tabs Take 250 mg by mouth daily as needed (LEG  CRAMPS). Reported on 05/01/2015   methocarbamol 500 MG tablet Commonly known as:  ROBAXIN Take 1 tablet (500 mg total) by mouth  every 6 (six) hours as needed for muscle spasms.   metoprolol succinate 50 MG 24 hr tablet Commonly known as:  TOPROL-XL Take 50 mg by mouth 2 (two) times daily.   nitroGLYCERIN 0.4 MG SL tablet Commonly known as:  NITROSTAT Place 0.4 mg under the tongue every 5 (five) minutes as needed for chest pain. Reported on 05/01/2015   potassium chloride SA 20 MEQ tablet Commonly known as:  K-DUR,KLOR-CON Take 20 mEq by mouth 2 (two) times daily.   rivaroxaban 10 MG Tabs tablet Commonly known as:  XARELTO Take 1 tablet (10 mg total) by mouth daily with breakfast. Take Xarelto for two and a half more weeks following discharge from the hospital, then discontinue Xarelto. Once the patient has completed the Xarelto, they may resume the 81 mg Aspirin.   solifenacin 5 MG tablet Commonly known as:  VESICARE One daily as needed What changed:  how much to take  how to take this  when to take this  additional instructions   traMADol 50 MG tablet Commonly known as:  ULTRAM Take 1-2 tablets (50-100 mg total) by mouth every 6 (six) hours as needed for moderate pain.   triamterene-hydrochlorothiazide 37.5-25 MG tablet Commonly known as:  MAXZIDE-25 Take 1 tablet by mouth daily.   TUMS PO Take 1 tablet by mouth daily.   valACYclovir 500 MG tablet Commonly known as:  VALTREX One every other day, increase 2 x day for 3 days at onset What changed:  how much to take  how to take this  when to take this  additional instructions   ZETIA 10 MG tablet Generic drug:  ezetimibe Take 10 mg by mouth daily.            Discharge Care Instructions        Start     Ordered   11/30/16 0000  rivaroxaban (XARELTO) 10 MG TABS tablet  Daily with breakfast    Question:  Supervising Provider  Answer:  [name]   11/29/16 2215   11/29/16 0000  methocarbamol (ROBAXIN) 500 MG tablet  Every 6 hours PRN    Question:  Supervising Provider  Answer:  [name]   11/29/16 2215   11/29/16  0000  traMADol (ULTRAM) 50 MG tablet  Every 6 hours PRN    Question:  Supervising Provider  Answer:  [name]   11/29/16 2215   11/29/16 0000  HYDROcodone-acetaminophen (NORCO) 7.5-325 MG tablet  Every 4 hours PRN    Question:  Supervising Provider  Answer:  [name]   11/29/16 2215   11/29/16 0000  Call MD / Call 911    Comments:  If you experience chest pain or shortness of breath, CALL 911 and be transported to the hospital emergency room.  If you develope a fever above 101 F, pus (white drainage) or increased drainage or redness at the wound, or calf pain, call your surgeon's office.   11/29/16 2215   11/29/16 0000  Discharge instructions    Comments:  Take Xarelto for two and a half more weeks, then discontinue Xarelto. Once the patient has completed the Xarelto, they may resume the 81 mg Aspirin.   Pick up stool softner and laxative for home use following surgery while on pain medications. Do not submerge incision under water. Please use good hand washing techniques while changing dressing each  day. May shower starting three days after surgery. Please use a clean towel to pat the incision dry following showers. Continue to use ice for pain and swelling after surgery. Do not use any lotions or creams on the incision until instructed by your surgeon.  Wear both TED hose on both legs during the day every day for three weeks, but may remove the TED hose at night at home.  Postoperative Constipation Protocol  Constipation - defined medically as fewer than three stools per week and severe constipation as less than one stool per week.  One of the most common issues patients have following surgery is constipation.  Even if you have a regular bowel pattern at home, your normal regimen is likely to be disrupted due to multiple reasons following surgery.  Combination of anesthesia, postoperative narcotics, change in appetite and fluid intake all can affect your bowels.  In order to  avoid complications following surgery, here are some recommendations in order to help you during your recovery period.  Colace (docusate) - Pick up an over-the-counter form of Colace or another stool softener and take twice a day as long as you are requiring postoperative pain medications.  Take with a full glass of water daily.  If you experience loose stools or diarrhea, hold the colace until you stool forms back up.  If your symptoms do not get better within 1 week or if they get worse, check with your doctor.  Dulcolax (bisacodyl) - Pick up over-the-counter and take as directed by the product packaging as needed to assist with the movement of your bowels.  Take with a full glass of water.  Use this product as needed if not relieved by Colace only.   MiraLax (polyethylene glycol) - Pick up over-the-counter to have on hand.  MiraLax is a solution that will increase the amount of water in your bowels to assist with bowel movements.  Take as directed and can mix with a glass of water, juice, soda, coffee, or tea.  Take if you go more than two days without a movement. Do not use MiraLax more than once per day. Call your doctor if you are still constipated or irregular after using this medication for 7 days in a row.  If you continue to have problems with postoperative constipation, please contact the office for further assistance and recommendations.  If you experience "the worst abdominal pain ever" or develop nausea or vomiting, please contact the office immediatly for further recommendations for treatment.   11/29/16 2215   11/29/16 0000  Diet - low sodium heart healthy     11/29/16 2215   11/29/16 0000  Constipation Prevention    Comments:  Drink plenty of fluids.  Prune juice may be helpful.  You may use a stool softener, such as Colace (over the counter) 100 mg twice a day.  Use MiraLax (over the counter) for constipation as needed.   11/29/16 2215   11/29/16 0000  Increase activity slowly as  tolerated     11/29/16 2215   11/29/16 0000  Patient may shower    Comments:  You may shower without a dressing once there is no drainage.  Do not wash over the wound.  If drainage remains, do not shower until drainage stops.   11/29/16 2215   11/29/16 0000  Weight bearing as tolerated    Question Answer Comment  Laterality left   Extremity Lower      11/29/16 2215   11/29/16 0000  Driving  restrictions    Comments:  No driving until released by the physician.   11/29/16 2215   11/29/16 0000  Lifting restrictions    Comments:  No lifting until released by the physician.   11/29/16 2215   11/29/16 0000  TED hose    Comments:  Use stockings (TED hose) for 3 weeks on both leg(s).  You may remove them at night for sleeping.   11/29/16 2215   11/29/16 0000  Change dressing    Comments:  Change dressing daily with sterile 4 x 4 inch gauze dressing and apply TED hose. Do not submerge the incision under water.   11/29/16 2215   11/29/16 0000  Do not put a pillow under the knee. Place it under the heel.     11/29/16 2215   11/29/16 0000  Do not sit on low chairs, stoools or toilet seats, as it may be difficult to get up from low surfaces     11/29/16 [place] Follow up.   Why:  walker Contact information: [place] [address] [phone]        [name], MD. Schedule an appointment as soon as possible for a visit on 12/13/2016.   Specialty:  Orthopedic Surgery Contact information: [address] [place] [address] [phone]           Signed: Arlee Muslim, PA-C Orthopaedic Surgery 12/02/2016, 2:36 PM

## 2016-11-29 NOTE — Evaluation (Signed)
Occupational Therapy Evaluation Patient Details Name: Mallory Watts MRN: [id number] DOB: 1941-02-18 Today's Date: 11/29/2016    History of Present Illness s/p L TKA   Clinical Impression   This 76 year old female was admitted for the above.  Most of the education was completed today; however, pt is going to see if she can borrow a tub bench.  If she can do this, I will review transfer prior to her discharge.  Will check back tomorrow.    Follow Up Recommendations  Supervision/Assistance - 24 hour    Equipment Recommendations  None recommended by OT    Recommendations for Other Services       Precautions / Restrictions Precautions Precautions: Fall;Knee Required Braces or Orthoses: Knee Immobilizer - Left Knee Immobilizer - Left: Discontinue once straight leg raise with < 10 degree lag Restrictions Weight Bearing Restrictions: No Other Position/Activity Restrictions: WBAT      Mobility Bed Mobility Overal bed mobility: Needs Assistance Bed Mobility: Supine to Sit     Supine to sit: HOB elevated;Min guard     General bed mobility comments: oob by PT  Transfers Overall transfer level: Needs assistance Equipment used: Rolling walker (2 wheeled) Transfers: Sit to/from Stand Sit to Stand: Min guard         General transfer comment: for safety    Balance                                           ADL either performed or assessed with clinical judgement   ADL Overall ADL's : Needs assistance/impaired     Grooming: Wash/dry hands;Supervision/safety;Standing   Upper Body Bathing: Set up;Sitting   Lower Body Bathing: Minimal assistance;Sit to/from stand   Upper Body Dressing : Set up;Sitting   Lower Body Dressing: Maximal assistance;Sit to/from stand   Toilet Transfer: Min guard;Ambulation;BSC   Toileting- Water quality scientist and Hygiene: Min guard;Sit to/from stand         General ADL Comments: ambulated to bathroom, donned  underwear and washed hands at the sink.  Her significant other came near the end of the session, and answered questions for both of them.  Pt had offer to borrow tub bench. She did not take this as she said toilet was in the way. Explained that she could position bench backwards at other end of the toilet (vs sponge bathe initially).  She is going to see if she can borrow this. Reviewed precautions and general safety     Vision         Perception     Praxis      Pertinent Vitals/Pain Pain Assessment: 0-10 Pain Score: 4  Pain Location: L Knee Pain Descriptors / Indicators: Sore Pain Intervention(s): Limited activity within patient's tolerance;Repositioned;Ice applied;Monitored during session;Premedicated before session     Hand Dominance     Extremity/Trunk Assessment Upper Extremity Assessment Upper Extremity Assessment: Overall WFL for tasks assessed           Communication Communication Communication: No difficulties   Cognition Arousal/Alertness: Awake/alert Behavior During Therapy: WFL for tasks assessed/performed Overall Cognitive Status: Within Functional Limits for tasks assessed                                     General Comments       Exercises  Shoulder Instructions      Home Living  Pt will return home with significant other. She has a 3:1 commode.  Home is multi-level, but walk in shower is in the basement, and there is no bed there. She may be able to borrow a tub bench or else she will sponge bathe initially                                        Prior Functioning/Environment Level of Independence: Independent                 OT Problem List: Decreased strength;Decreased activity tolerance;Pain;Decreased knowledge of use of DME or AE      OT Treatment/Interventions: Self-care/ADL training;DME and/or AE instruction;Patient/family education    OT Goals(Current goals can be found in the care plan section)  Acute Rehab OT Goals Patient Stated Goal: Pt would like to return home  OT Goal Formulation: With patient Time For Goal Achievement: 12/06/16 Potential to Achieve Goals: Good  OT Frequency: Min 2X/week   Barriers to D/C:            Co-evaluation              AM-PAC PT "6 Clicks" Daily Activity     Outcome Measure Help from another person eating meals?: None Help from another person taking care of personal grooming?: A Little Help from another person toileting, which includes using toliet, bedpan, or urinal?: A Little Help from another person bathing (including washing, rinsing, drying)?: A Little Help from another person to put on and taking off regular upper body clothing?: A Little Help from another person to put on and taking off regular lower body clothing?: A Lot 6 Click Score: 18   End of Session    Activity Tolerance: Patient tolerated treatment well (vomited at end of session) Patient left: in chair;with call bell/phone within reach;with chair alarm set;with family/visitor present  OT Visit Diagnosis: Pain Pain - Right/Left: Left Pain - part of body: Knee                Time: 8366-2947 OT Time Calculation (min): 37 min Charges:  OT General Charges $OT Visit: 1 Procedure OT Evaluation $OT Eval Low Complexity: 1 Procedure OT Treatments $Self Care/Home Management : 8-22 mins G-Codes:     Lesle Chris, OTR/L [phone] 11/29/2016  Mallory Watts 11/29/2016, 12:39 PM

## 2016-11-29 NOTE — Progress Notes (Signed)
Physical Therapy Treatment Patient Details Name: Mallory Watts MRN: [id number] DOB: 02-16-1941 Today's Date: 11/29/2016    History of Present Illness s/p L TKA    PT Comments    Pt tolerated treatment well. Pt able to perform SLR but was still apprehensive from feeling light headed earlier. Pt reports feeling better today but complained of HA. Pt able to perform exercises and ambulate. Perform stair mobility during next session.   Follow Up Recommendations  Outpatient PT;DC plan and follow up therapy as arranged by surgeon     Equipment Recommendations  Rolling walker with 5" wheels    Recommendations for Other Services       Precautions / Restrictions Precautions Precautions: Fall;Knee Required Braces or Orthoses: Knee Immobilizer - Left Knee Immobilizer - Left: Discontinue once straight leg raise with < 10 degree lag Restrictions Weight Bearing Restrictions: No Other Position/Activity Restrictions: WBAT    Mobility  Bed Mobility Overal bed mobility: Needs Assistance Bed Mobility: Supine to Sit     Supine to sit: HOB elevated;Min guard     General bed mobility comments: Increased time, pt able to manage LE with bed mobility  Transfers Overall transfer level: Needs assistance Equipment used: Rolling walker (2 wheeled) Transfers: Sit to/from Stand Sit to Stand: Min assist         General transfer comment: Pt requires min A to rise and steady trunk, verbal cues for hand placement   Ambulation/Gait Ambulation/Gait assistance: Min guard Ambulation Distance (Feet): 120 Feet Assistive device: Rolling walker (2 wheeled) Gait Pattern/deviations: Decreased stride length;Step-through pattern;Decreased stance time - left Gait velocity: decr   General Gait Details: Pt provided with verbal cues to roll and not lift RW, pt gait progressing to step-through pattern    Stairs            Wheelchair Mobility    Modified Rankin (Stroke Patients Only)        Balance                                            Cognition Arousal/Alertness: Awake/alert Behavior During Therapy: WFL for tasks assessed/performed Overall Cognitive Status: Within Functional Limits for tasks assessed                                        Exercises Total Joint Exercises Ankle Circles/Pumps: Left;AROM;20 reps Quad Sets: Both;10 reps;AROM Towel Squeeze: AROM;Both;10 reps Heel Slides: AAROM;10 reps;Left Hip ABduction/ADduction: AROM;Left;10 reps Straight Leg Raises: AROM;10 reps;Left Goniometric ROM: L Knee flexion approx 35 degrees AAROM    General Comments        Pertinent Vitals/Pain Pain Assessment: 0-10 Pain Score: 4  Pain Location: L Knee Pain Descriptors / Indicators: Sore Pain Intervention(s): Limited activity within patient's tolerance;Repositioned;Ice applied;Monitored during session;Premedicated before session    Home Living                      Prior Function Level of Independence: Independent          PT Goals (current goals can now be found in the care plan section) Acute Rehab PT Goals Patient Stated Goal: Pt would like to return home  Progress towards PT goals: Progressing toward goals    Frequency    7X/week  PT Plan Current plan remains appropriate    Co-evaluation              AM-PAC PT "6 Clicks" Daily Activity  Outcome Measure  Difficulty turning over in bed (including adjusting bedclothes, sheets and blankets)?: None Difficulty moving from lying on back to sitting on the side of the bed? : A Little Difficulty sitting down on and standing up from a chair with arms (e.g., wheelchair, bedside commode, etc,.)?: Unable Help needed moving to and from a bed to chair (including a wheelchair)?: A Little Help needed walking in hospital room?: A Little Help needed climbing 3-5 steps with a railing? : A Little 6 Click Score: 17    End of Session Equipment Utilized During  Treatment: Gait belt;Left knee immobilizer Activity Tolerance: Patient tolerated treatment well Patient left: in chair;with chair alarm set;with call bell/phone within reach Nurse Communication: Mobility status PT Visit Diagnosis: Difficulty in walking, not elsewhere classified (R26.2)     Time: 3817-7116 PT Time Calculation (min) (ACUTE ONLY): 30 min  Charges:  $Gait Training: 8-22 mins $Therapeutic Exercise: 8-22 mins                    G Codes:       [name], SPT    Reino Bellis 11/29/2016, 1:06 PM

## 2016-11-30 LAB — CBC
HEMATOCRIT: 31.3 % — AB (ref 36.0–46.0)
HEMOGLOBIN: 10.6 g/dL — AB (ref 12.0–15.0)
MCH: 30.9 pg (ref 26.0–34.0)
MCHC: 33.9 g/dL (ref 30.0–36.0)
MCV: 91.3 fL (ref 78.0–100.0)
Platelets: 176 10*3/uL (ref 150–400)
RBC: 3.43 MIL/uL — ABNORMAL LOW (ref 3.87–5.11)
RDW: 13.9 % (ref 11.5–15.5)
WBC: 11.8 10*3/uL — AB (ref 4.0–10.5)

## 2016-11-30 LAB — BASIC METABOLIC PANEL
ANION GAP: 5 (ref 5–15)
BUN: 18 mg/dL (ref 6–20)
CHLORIDE: 105 mmol/L (ref 101–111)
CO2: 26 mmol/L (ref 22–32)
Calcium: 8 mg/dL — ABNORMAL LOW (ref 8.9–10.3)
Creatinine, Ser: 0.89 mg/dL (ref 0.44–1.00)
GFR calc non Af Amer: >60 mL/min (ref >60)
GLUCOSE: 110 mg/dL — AB (ref 65–99)
POTASSIUM: 3.9 mmol/L (ref 3.5–5.1)
Sodium: 136 mmol/L (ref 135–145)

## 2016-11-30 NOTE — Progress Notes (Signed)
Physical Therapy Treatment Patient Details Name: Mallory Watts MRN: [id number] DOB: 1940/04/29 Today's Date: 11/30/2016    History of Present Illness s/p L TKA    PT Comments    Pt tolerated session well. Pt still requesting use of immobilizer because she has felt nausea and dizziness the last couple of days. Pt able to perform stair mobility, exercises, and ambulation. Pt still reports pain 6/10 following activity but reports being ready for discharge.    Follow Up Recommendations  Outpatient PT;DC plan and follow up therapy as arranged by surgeon     Equipment Recommendations  Rolling walker with 5" wheels    Recommendations for Other Services       Precautions / Restrictions Precautions Precautions: Fall;Knee Required Braces or Orthoses: Knee Immobilizer - Left Knee Immobilizer - Left: Discontinue once straight leg raise with < 10 degree lag Restrictions Weight Bearing Restrictions: No Other Position/Activity Restrictions: WBAT    Mobility  Bed Mobility Overal bed mobility: Needs Assistance Bed Mobility: Supine to Sit     Supine to sit: HOB elevated;Supervision     General bed mobility comments: supervision for safety, pt able to manage LE off of bed  Transfers Overall transfer level: Needs assistance Equipment used: Rolling walker (2 wheeled) Transfers: Sit to/from Stand Sit to Stand: Supervision         General transfer comment: Supervision for safety, pt able to rise and steady trunk  Ambulation/Gait Ambulation/Gait assistance: Min guard Ambulation Distance (Feet): 200 Feet Assistive device: Rolling walker (2 wheeled) Gait Pattern/deviations: Decreased stride length;Step-through pattern;Decreased stance time - left Gait velocity: decr   General Gait Details: Pt reports no increase in pain with ambulation, pt still requesting immobilizer during ambulation    Stairs Stairs: Yes   Stair Management: Two rails;Step to pattern;Forwards Number of  Stairs: 2 General stair comments: Pt performed 2 repetitions of 2 steps using step to pattern and bilat hand rails, verbal cues provided for sequencing   Wheelchair Mobility    Modified Rankin (Stroke Patients Only)       Balance                                            Cognition Arousal/Alertness: Awake/alert Behavior During Therapy: WFL for tasks assessed/performed Overall Cognitive Status: Within Functional Limits for tasks assessed                                        Exercises Total Joint Exercises Ankle Circles/Pumps: Left;AROM;20 reps Quad Sets: Both;10 reps;AROM Towel Squeeze: AROM;Both;10 reps Heel Slides: AAROM;10 reps;Left Hip ABduction/ADduction: AROM;Left;10 reps Straight Leg Raises: AROM;10 reps;Left Goniometric ROM: L knee flexion approx 45 degrees AAROM     General Comments        Pertinent Vitals/Pain Pain Assessment: 0-10 Pain Score: 6  Faces Pain Scale: Hurts a little bit Pain Location: L Knee Pain Descriptors / Indicators: Sore Pain Intervention(s): Repositioned;Ice applied;Limited activity within patient's tolerance;Monitored during session;Premedicated before session    Home Living                      Prior Function            PT Goals (current goals can now be found in the care plan section) Progress towards PT goals:  Progressing toward goals    Frequency    7X/week      PT Plan Current plan remains appropriate    Co-evaluation              AM-PAC PT "6 Clicks" Daily Activity  Outcome Measure  Difficulty turning over in bed (including adjusting bedclothes, sheets and blankets)?: None Difficulty moving from lying on back to sitting on the side of the bed? : None Difficulty sitting down on and standing up from a chair with arms (e.g., wheelchair, bedside commode, etc,.)?: A Little Help needed moving to and from a bed to chair (including a wheelchair)?: A Little Help needed  walking in hospital room?: A Little Help needed climbing 3-5 steps with a railing? : A Little 6 Click Score: 20    End of Session Equipment Utilized During Treatment: Gait belt;Left knee immobilizer Activity Tolerance: Patient tolerated treatment well Patient left: in chair;with call bell/phone within reach Nurse Communication: Mobility status PT Visit Diagnosis: Difficulty in walking, not elsewhere classified (R26.2)     Time: 5916-3846 PT Time Calculation (min) (ACUTE ONLY): 37 min  Charges:  $Gait Training: 8-22 mins $Therapeutic Exercise: 8-22 mins                    G Codes:       [name], SPT    Reino Bellis 11/30/2016, 1:49 PM

## 2016-11-30 NOTE — Progress Notes (Signed)
   Subjective: 2 Days Post-Op Procedure(s) (LRB): LEFT TOTAL KNEE ARTHROPLASTY (Left) Patient reports pain as mild.   Patient seen in rounds by Dr. Wynelle Link. Patient is well, but has had some minor complaints of pain in the knee, requiring pain medications Patient is ready to go home  Objective: Vital signs in last 24 hours: Temp:  [97.4 F (36.3 C)-97.8 F (36.6 C)] 97.8 F (36.6 C) (08/22 0548) Pulse Rate:  [69-80] 80 (08/22 0548) Resp:  [15-16] 16 (08/22 0548) BP: (134-151)/(64-86) 134/86 (08/22 0548) SpO2:  [93 %-99 %] 98 % (08/22 0548)  Intake/Output from previous day:  Intake/Output Summary (Last 24 hours) at 11/30/16 0904 Last data filed at 11/30/16 0600  Gross per 24 hour  Intake          1171.25 ml  Output             1850 ml  Net          -678.75 ml    Intake/Output this shift: No intake/output data recorded.  Labs:  Recent Labs  11/29/16 0745 11/30/16 0534  HGB 11.8* 10.6*    Recent Labs  11/29/16 0745 11/30/16 0534  WBC 12.1* 11.8*  RBC 3.83* 3.43*  HCT 35.0* 31.3*  PLT 190 176    Recent Labs  11/29/16 0745 11/30/16 0534  NA 137 136  K 3.4* 3.9  CL 105 105  CO2 25 26  BUN 19 18  CREATININE 0.96 0.89  GLUCOSE 140* 110*  CALCIUM 8.1* 8.0*   No results for input(s): LABPT, INR in the last 72 hours.  EXAM: General - Patient is Alert and Appropriate Extremity - Neurovascular intact Sensation intact distally Intact pulses distally Dorsiflexion/Plantar flexion intact Incision - clean, dry Motor Function - intact, moving foot and toes well on exam.   Assessment/Plan: 2 Days Post-Op Procedure(s) (LRB): LEFT TOTAL KNEE ARTHROPLASTY (Left) Procedure(s) (LRB): LEFT TOTAL KNEE ARTHROPLASTY (Left) Past Medical History:  Diagnosis Date  . Arthritis   . Atrial fibrillation ([place])    not symptomatic -on metoprolol BID for this   . Cancer ([place]) 1998   breast cancer--left  . Dysrhythmia    atrial flutter/fib  . GERD (gastroesophageal  reflux disease)   . Hypertension   . Osteopenia   . Personal history of chemotherapy 1998  . Personal history of radiation therapy 1998  . PONV (postoperative nausea and vomiting)   . Shortness of breath    exertion  . STD (sexually transmitted disease)    HSV  . Urinary incontinence    Principal Problem:   OA (osteoarthritis) of knee  Estimated body mass index is 31.18 kg/m as calculated from the following:   Height as of this encounter: 5\' 1"  (1.549 m).   Weight as of this encounter: 74.8 kg (165 lb). Up with therapy Diet - Cardiac diet Follow up - in 2 weeks Activity - WBAT Disposition - Home Condition Upon Discharge - Stable D/C Meds - See DC Summary DVT Prophylaxis - Xarelto  Arlee Muslim, PA-C Orthopaedic Surgery 11/30/2016, 9:04 AM

## 2016-11-30 NOTE — Progress Notes (Signed)
Occupational Therapy Treatment Patient Details Name: Mallory Watts MRN: [id number] DOB: 1940/04/22 Today's Date: 11/30/2016    History of present illness s/p L TKA   OT comments  Education completed this session  Follow Up Recommendations  Supervision/Assistance - 24 hour    Equipment Recommendations  None recommended by OT    Recommendations for Other Services      Precautions / Restrictions Precautions Precautions: Fall;Knee Knee Immobilizer - Left: Discontinue once straight leg raise with < 10 degree lag Restrictions Weight Bearing Restrictions: No       Mobility Bed Mobility                  Transfers                      Balance                                           ADL either performed or assessed with clinical judgement   ADL                                         General ADL Comments: brought tub bench by and demonstrated set up/adjustment and reversibility of seat as well as sequence to get in/out.  Pt is wearing KI at this time and educated her to wear it until she is seated on bench. She verbalizes understanding and did not feel she needed to practice prior to home.  No further questions for OT     Vision       Perception     Praxis      Cognition Arousal/Alertness: Awake/alert Behavior During Therapy: WFL for tasks assessed/performed Overall Cognitive Status: Within Functional Limits for tasks assessed                                          Exercises     Shoulder Instructions       General Comments      Pertinent Vitals/ Pain       Pain Assessment: Faces Faces Pain Scale: Hurts a little bit Pain Location: L Knee Pain Descriptors / Indicators: Sore Pain Intervention(s): Monitored during session  Home Living                                          Prior Functioning/Environment              Frequency           Progress  Toward Goals  OT Goals(current goals can now be found in the care plan section)  Progress towards OT goals: Goals met/education completed, patient discharged from [facility] PT "6 Clicks" Daily Activity     Outcome Measure   Help from another person eating meals?: None Help from another person taking care of personal grooming?: A Little Help from another person toileting, which includes using toliet, bedpan, or urinal?:  A Little Help from another person bathing (including washing, rinsing, drying)?: A Little Help from another person to put on and taking off regular upper body clothing?: A Little Help from another person to put on and taking off regular lower body clothing?: A Lot 6 Click Score: 18    End of Session    OT Visit Diagnosis: Pain Pain - Right/Left: Left Pain - part of body: Knee   Activity Tolerance Patient tolerated treatment well   Patient Left in bed;with call bell/phone within reach   Nurse Communication          Time: 8889-1694 OT Time Calculation (min): 10 min  Charges: OT General Charges $OT Visit: 1 Procedure OT Treatments $Self Care/Home Management : 8-22 mins  Lesle Chris, OTR/L [phone] 11/30/2016   [place] 11/30/2016, 12:15 PM

## 2016-12-02 DIAGNOSIS — M1712 Unilateral primary osteoarthritis, left knee: Secondary | ICD-10-CM | POA: Diagnosis not present

## 2016-12-05 DIAGNOSIS — M1712 Unilateral primary osteoarthritis, left knee: Secondary | ICD-10-CM | POA: Diagnosis not present

## 2016-12-07 DIAGNOSIS — M1712 Unilateral primary osteoarthritis, left knee: Secondary | ICD-10-CM | POA: Diagnosis not present

## 2016-12-09 DIAGNOSIS — M1712 Unilateral primary osteoarthritis, left knee: Secondary | ICD-10-CM | POA: Diagnosis not present

## 2016-12-13 DIAGNOSIS — M1712 Unilateral primary osteoarthritis, left knee: Secondary | ICD-10-CM | POA: Diagnosis not present

## 2016-12-14 DIAGNOSIS — M1712 Unilateral primary osteoarthritis, left knee: Secondary | ICD-10-CM | POA: Diagnosis not present

## 2016-12-16 DIAGNOSIS — M1712 Unilateral primary osteoarthritis, left knee: Secondary | ICD-10-CM | POA: Diagnosis not present

## 2016-12-19 DIAGNOSIS — M1712 Unilateral primary osteoarthritis, left knee: Secondary | ICD-10-CM | POA: Diagnosis not present

## 2016-12-23 DIAGNOSIS — M1712 Unilateral primary osteoarthritis, left knee: Secondary | ICD-10-CM | POA: Diagnosis not present

## 2016-12-26 DIAGNOSIS — M1712 Unilateral primary osteoarthritis, left knee: Secondary | ICD-10-CM | POA: Diagnosis not present

## 2016-12-28 DIAGNOSIS — M1712 Unilateral primary osteoarthritis, left knee: Secondary | ICD-10-CM | POA: Diagnosis not present

## 2016-12-30 DIAGNOSIS — M1712 Unilateral primary osteoarthritis, left knee: Secondary | ICD-10-CM | POA: Diagnosis not present

## 2017-01-02 DIAGNOSIS — M1712 Unilateral primary osteoarthritis, left knee: Secondary | ICD-10-CM | POA: Diagnosis not present

## 2017-01-03 DIAGNOSIS — Z96652 Presence of left artificial knee joint: Secondary | ICD-10-CM | POA: Diagnosis not present

## 2017-01-03 DIAGNOSIS — Z471 Aftercare following joint replacement surgery: Secondary | ICD-10-CM | POA: Diagnosis not present

## 2017-01-03 DIAGNOSIS — M1712 Unilateral primary osteoarthritis, left knee: Secondary | ICD-10-CM | POA: Diagnosis not present

## 2017-01-06 DIAGNOSIS — M1712 Unilateral primary osteoarthritis, left knee: Secondary | ICD-10-CM | POA: Diagnosis not present

## 2017-01-10 DIAGNOSIS — M1712 Unilateral primary osteoarthritis, left knee: Secondary | ICD-10-CM | POA: Diagnosis not present

## 2017-01-13 DIAGNOSIS — M1712 Unilateral primary osteoarthritis, left knee: Secondary | ICD-10-CM | POA: Diagnosis not present

## 2017-01-17 DIAGNOSIS — M1712 Unilateral primary osteoarthritis, left knee: Secondary | ICD-10-CM | POA: Diagnosis not present

## 2017-01-20 DIAGNOSIS — M1712 Unilateral primary osteoarthritis, left knee: Secondary | ICD-10-CM | POA: Diagnosis not present

## 2017-01-27 DIAGNOSIS — M1712 Unilateral primary osteoarthritis, left knee: Secondary | ICD-10-CM | POA: Diagnosis not present

## 2017-01-31 DIAGNOSIS — M1712 Unilateral primary osteoarthritis, left knee: Secondary | ICD-10-CM | POA: Diagnosis not present

## 2017-02-03 DIAGNOSIS — M1712 Unilateral primary osteoarthritis, left knee: Secondary | ICD-10-CM | POA: Diagnosis not present

## 2017-02-06 DIAGNOSIS — Z961 Presence of intraocular lens: Secondary | ICD-10-CM | POA: Diagnosis not present

## 2017-02-06 DIAGNOSIS — H35371 Puckering of macula, right eye: Secondary | ICD-10-CM | POA: Diagnosis not present

## 2017-02-06 DIAGNOSIS — H26493 Other secondary cataract, bilateral: Secondary | ICD-10-CM | POA: Diagnosis not present

## 2017-02-06 DIAGNOSIS — H04123 Dry eye syndrome of bilateral lacrimal glands: Secondary | ICD-10-CM | POA: Diagnosis not present

## 2017-02-07 DIAGNOSIS — M1712 Unilateral primary osteoarthritis, left knee: Secondary | ICD-10-CM | POA: Diagnosis not present

## 2017-02-22 DIAGNOSIS — Z23 Encounter for immunization: Secondary | ICD-10-CM | POA: Diagnosis not present

## 2017-03-14 DIAGNOSIS — I2510 Atherosclerotic heart disease of native coronary artery without angina pectoris: Secondary | ICD-10-CM | POA: Diagnosis not present

## 2017-03-14 DIAGNOSIS — Z853 Personal history of malignant neoplasm of breast: Secondary | ICD-10-CM | POA: Diagnosis not present

## 2017-03-14 DIAGNOSIS — Z951 Presence of aortocoronary bypass graft: Secondary | ICD-10-CM | POA: Diagnosis not present

## 2017-03-14 DIAGNOSIS — I483 Typical atrial flutter: Secondary | ICD-10-CM | POA: Diagnosis not present

## 2017-03-14 DIAGNOSIS — I119 Hypertensive heart disease without heart failure: Secondary | ICD-10-CM | POA: Diagnosis not present

## 2017-03-14 DIAGNOSIS — E7849 Other hyperlipidemia: Secondary | ICD-10-CM | POA: Diagnosis not present

## 2017-07-05 DIAGNOSIS — M25531 Pain in right wrist: Secondary | ICD-10-CM | POA: Diagnosis not present

## 2017-07-19 DIAGNOSIS — L819 Disorder of pigmentation, unspecified: Secondary | ICD-10-CM | POA: Diagnosis not present

## 2017-07-19 DIAGNOSIS — I10 Essential (primary) hypertension: Secondary | ICD-10-CM | POA: Diagnosis not present

## 2017-07-19 DIAGNOSIS — I2510 Atherosclerotic heart disease of native coronary artery without angina pectoris: Secondary | ICD-10-CM | POA: Diagnosis not present

## 2017-07-19 DIAGNOSIS — K219 Gastro-esophageal reflux disease without esophagitis: Secondary | ICD-10-CM | POA: Diagnosis not present

## 2017-07-19 DIAGNOSIS — R42 Dizziness and giddiness: Secondary | ICD-10-CM | POA: Diagnosis not present

## 2017-07-19 DIAGNOSIS — Z6830 Body mass index (BMI) 30.0-30.9, adult: Secondary | ICD-10-CM | POA: Diagnosis not present

## 2017-07-19 DIAGNOSIS — R739 Hyperglycemia, unspecified: Secondary | ICD-10-CM | POA: Diagnosis not present

## 2017-07-19 DIAGNOSIS — E669 Obesity, unspecified: Secondary | ICD-10-CM | POA: Diagnosis not present

## 2017-07-19 DIAGNOSIS — I480 Paroxysmal atrial fibrillation: Secondary | ICD-10-CM | POA: Diagnosis not present

## 2017-07-19 DIAGNOSIS — R600 Localized edema: Secondary | ICD-10-CM | POA: Diagnosis not present

## 2017-07-19 DIAGNOSIS — J309 Allergic rhinitis, unspecified: Secondary | ICD-10-CM | POA: Diagnosis not present

## 2017-07-26 ENCOUNTER — Other Ambulatory Visit: Payer: Self-pay | Admitting: Internal Medicine

## 2017-07-26 DIAGNOSIS — Z1231 Encounter for screening mammogram for malignant neoplasm of breast: Secondary | ICD-10-CM

## 2017-08-22 DIAGNOSIS — L814 Other melanin hyperpigmentation: Secondary | ICD-10-CM | POA: Diagnosis not present

## 2017-08-25 DIAGNOSIS — Z471 Aftercare following joint replacement surgery: Secondary | ICD-10-CM | POA: Diagnosis not present

## 2017-08-25 DIAGNOSIS — M1712 Unilateral primary osteoarthritis, left knee: Secondary | ICD-10-CM | POA: Diagnosis not present

## 2017-08-25 DIAGNOSIS — Z96652 Presence of left artificial knee joint: Secondary | ICD-10-CM | POA: Diagnosis not present

## 2017-08-28 ENCOUNTER — Ambulatory Visit
Admission: RE | Admit: 2017-08-28 | Discharge: 2017-08-28 | Disposition: A | Discharge status: Home / Self Care | Payer: Medicare Other | Source: Ambulatory Visit | Attending: Internal Medicine | Admitting: Internal Medicine

## 2017-08-28 DIAGNOSIS — Z1231 Encounter for screening mammogram for malignant neoplasm of breast: Secondary | ICD-10-CM

## 2017-09-26 DIAGNOSIS — I119 Hypertensive heart disease without heart failure: Secondary | ICD-10-CM | POA: Diagnosis not present

## 2017-09-26 DIAGNOSIS — E785 Hyperlipidemia, unspecified: Secondary | ICD-10-CM | POA: Diagnosis not present

## 2017-09-26 DIAGNOSIS — E7849 Other hyperlipidemia: Secondary | ICD-10-CM | POA: Diagnosis not present

## 2017-09-26 DIAGNOSIS — Z853 Personal history of malignant neoplasm of breast: Secondary | ICD-10-CM | POA: Diagnosis not present

## 2017-09-26 DIAGNOSIS — I2510 Atherosclerotic heart disease of native coronary artery without angina pectoris: Secondary | ICD-10-CM | POA: Diagnosis not present

## 2017-09-26 DIAGNOSIS — I483 Typical atrial flutter: Secondary | ICD-10-CM | POA: Diagnosis not present

## 2017-09-26 DIAGNOSIS — Z951 Presence of aortocoronary bypass graft: Secondary | ICD-10-CM | POA: Diagnosis not present

## 2017-11-27 DIAGNOSIS — M859 Disorder of bone density and structure, unspecified: Secondary | ICD-10-CM | POA: Diagnosis not present

## 2017-11-27 DIAGNOSIS — R7309 Other abnormal glucose: Secondary | ICD-10-CM | POA: Diagnosis not present

## 2017-11-27 DIAGNOSIS — E7849 Other hyperlipidemia: Secondary | ICD-10-CM | POA: Diagnosis not present

## 2017-11-27 DIAGNOSIS — I10 Essential (primary) hypertension: Secondary | ICD-10-CM | POA: Diagnosis not present

## 2017-11-27 DIAGNOSIS — R82998 Other abnormal findings in urine: Secondary | ICD-10-CM | POA: Diagnosis not present

## 2017-12-04 DIAGNOSIS — R7309 Other abnormal glucose: Secondary | ICD-10-CM | POA: Diagnosis not present

## 2017-12-04 DIAGNOSIS — Z0000 Encounter for general adult medical examination without abnormal findings: Secondary | ICD-10-CM | POA: Diagnosis not present

## 2017-12-04 DIAGNOSIS — Z1389 Encounter for screening for other disorder: Secondary | ICD-10-CM | POA: Diagnosis not present

## 2017-12-04 DIAGNOSIS — I480 Paroxysmal atrial fibrillation: Secondary | ICD-10-CM | POA: Diagnosis not present

## 2017-12-04 DIAGNOSIS — I1310 Hypertensive heart and chronic kidney disease without heart failure, with stage 1 through stage 4 chronic kidney disease, or unspecified chronic kidney disease: Secondary | ICD-10-CM | POA: Diagnosis not present

## 2017-12-04 DIAGNOSIS — I119 Hypertensive heart disease without heart failure: Secondary | ICD-10-CM | POA: Diagnosis not present

## 2017-12-04 DIAGNOSIS — Z1212 Encounter for screening for malignant neoplasm of rectum: Secondary | ICD-10-CM | POA: Diagnosis not present

## 2017-12-04 DIAGNOSIS — N183 Chronic kidney disease, stage 3 (moderate): Secondary | ICD-10-CM | POA: Diagnosis not present

## 2017-12-04 DIAGNOSIS — Z853 Personal history of malignant neoplasm of breast: Secondary | ICD-10-CM | POA: Diagnosis not present

## 2017-12-04 DIAGNOSIS — E668 Other obesity: Secondary | ICD-10-CM | POA: Diagnosis not present

## 2017-12-04 DIAGNOSIS — Z23 Encounter for immunization: Secondary | ICD-10-CM | POA: Diagnosis not present

## 2017-12-04 DIAGNOSIS — I2510 Atherosclerotic heart disease of native coronary artery without angina pectoris: Secondary | ICD-10-CM | POA: Diagnosis not present

## 2017-12-04 DIAGNOSIS — E7849 Other hyperlipidemia: Secondary | ICD-10-CM | POA: Diagnosis not present

## 2017-12-04 DIAGNOSIS — Z6831 Body mass index (BMI) 31.0-31.9, adult: Secondary | ICD-10-CM | POA: Diagnosis not present

## 2017-12-20 ENCOUNTER — Encounter: Payer: Self-pay | Admitting: Obstetrics and Gynecology

## 2017-12-20 DIAGNOSIS — N3281 Overactive bladder: Secondary | ICD-10-CM | POA: Diagnosis not present

## 2017-12-20 DIAGNOSIS — Z6831 Body mass index (BMI) 31.0-31.9, adult: Secondary | ICD-10-CM | POA: Diagnosis not present

## 2017-12-20 DIAGNOSIS — R300 Dysuria: Secondary | ICD-10-CM | POA: Diagnosis not present

## 2017-12-20 DIAGNOSIS — N952 Postmenopausal atrophic vaginitis: Secondary | ICD-10-CM | POA: Diagnosis not present

## 2017-12-20 DIAGNOSIS — I10 Essential (primary) hypertension: Secondary | ICD-10-CM | POA: Diagnosis not present

## 2017-12-28 ENCOUNTER — Telehealth: Payer: Self-pay | Admitting: Obstetrics and Gynecology

## 2017-12-28 NOTE — Telephone Encounter (Signed)
Called and left a message for patient to call back to schedule a new patient doctor referral appointment with our office to see Dr. Quincy Simmonds for vaginal atrophy. Record to Dr. Quincy Simmonds for review.

## 2018-01-24 ENCOUNTER — Other Ambulatory Visit: Payer: Self-pay

## 2018-01-24 ENCOUNTER — Encounter

## 2018-01-24 ENCOUNTER — Ambulatory Visit (INDEPENDENT_AMBULATORY_CARE_PROVIDER_SITE_OTHER): Payer: Medicare Other | Admitting: Obstetrics and Gynecology

## 2018-01-24 ENCOUNTER — Encounter: Payer: Self-pay | Admitting: Obstetrics and Gynecology

## 2018-01-24 VITALS — BP 122/74 | HR 100 | Resp 22 | Ht 61.0 in | Wt 174.6 lb

## 2018-01-24 DIAGNOSIS — B009 Herpesviral infection, unspecified: Secondary | ICD-10-CM | POA: Diagnosis not present

## 2018-01-24 DIAGNOSIS — A6009 Herpesviral infection of other urogenital tract: Secondary | ICD-10-CM

## 2018-01-24 DIAGNOSIS — N952 Postmenopausal atrophic vaginitis: Secondary | ICD-10-CM | POA: Diagnosis not present

## 2018-01-24 DIAGNOSIS — N390 Urinary tract infection, site not specified: Secondary | ICD-10-CM | POA: Diagnosis not present

## 2018-01-24 LAB — POCT URINALYSIS DIPSTICK
Bilirubin, UA: NEGATIVE
Blood, UA: NEGATIVE
Glucose, UA: NEGATIVE
KETONES UA: NEGATIVE
Leukocytes, UA: NEGATIVE
NITRITE UA: NEGATIVE
PH UA: 5 (ref 5.0–8.0)
PROTEIN UA: NEGATIVE
Urobilinogen, UA: 0.2 E.U./dL

## 2018-01-24 MED ORDER — VALACYCLOVIR HCL 500 MG PO TABS: 500.0000 mg | ORAL_TABLET | Freq: Every day | ORAL | 3 refills | Status: AC

## 2018-01-24 NOTE — Progress Notes (Signed)
77 y.o. G0P0 Single Caucasian female here for frequent UTIs and vaginal dryness.    Has 4 - 6 UTIs per year.  Going on for years.  Has been seen at [facility] for recurrent UTIs, but her midlevel provider retired.  Tried a medication for overactive bladder for one month.  Does not remember the name.   On Vesicare for overactive bladder from her PCP.  Taking this for a few years and this works well.  States she voids well.  She does leak urine with a cough or sneeze.   Recent Macrobid for UTI through her PCP.  No painful bladder symptoms today.   Patient was being treated also for irritable bowel syndrome.  She does not remember the Rx.   She was using Replens for vaginal dryness.  Uses a product from [place] also.   Hx herpes and Valtrex controls this.  PCP prescribing.  Wants to have a prescription for more tablets at a time.   Urine dip - negative.   PCP: [name], MD   Patient's last menstrual period was 04/11/1994.           Sexually active: No.  The current method of family planning is post menopausal status.    Exercising: No.   Smoker:  Former  Health Maintenance: Pap: 07-21-14 Neg  History of abnormal Pap:  no MMG: 08-28-17 Neg/density C/BiRads1 Colonoscopy:  2017 normal--no further needed per GI BMD:  2017  Result  Osteoporosis--takes Fosamax TDaP: PCP Gardasil:   no   Screening Labs:  Hb today: PCP, Urine today: negative urine dip.    reports that she has quit smoking. She has never used smokeless tobacco. She reports that she drinks about 2.0 - 3.0 standard drinks of alcohol per week. She reports that she does not use drugs.  Past Medical History:  Diagnosis Date  . Arthritis   . Atrial fibrillation ([place])    not symptomatic -on metoprolol BID for this   . Cancer ([place]) 1998   breast cancer--left  . Dysrhythmia    atrial flutter/fib  . GERD (gastroesophageal reflux disease)   . Hypertension   . Osteopenia   . Personal history of  chemotherapy 1998  . Personal history of radiation therapy 1998  . PONV (postoperative nausea and vomiting)   . Shortness of breath    exertion  . STD (sexually transmitted disease)    HSV  . Urinary incontinence     Past Surgical History:  Procedure Laterality Date  . ANTERIOR CERVICAL DECOMP/DISCECTOMY FUSION N/A 01/14/2014   Procedure: ANTERIOR CERVICAL DECOMPRESSION/DISCECTOMY FUSION CERVICAL FOUR-FIVE,CERVICAL FIVE SIX;  Surgeon: [name], MD;  Location: [facility];  Service: Neurosurgery;  Laterality: N/A;  . BREAST EXCISIONAL BIOPSY Left   . BREAST LUMPECTOMY Left 1998  . BREAST SURGERY  1998   left lumpectomy for breast ca  . COLONOSCOPY    . CORONARY ARTERY BYPASS GRAFT  2004   at [facility]  . DILATION AND CURETTAGE OF UTERUS  2006  . heart bypass  2003   -single  . POLYPECTOMY    . TONSILLECTOMY    . TOTAL KNEE ARTHROPLASTY Left 11/28/2016   Procedure: LEFT TOTAL KNEE ARTHROPLASTY;  Surgeon: [name], MD;  Location: [facility];  Service: Orthopedics;  Laterality: Left;  Canal block    Current Outpatient Medications  Medication Sig Dispense Refill  . acetaminophen (TYLENOL) 650 MG CR tablet Take 1,300 mg by mouth 2 (two) times daily as needed for pain.    Marland Kitchen  alendronate (FOSAMAX) 70 MG tablet Take 70 mg by mouth once a week. Take with a full glass of water on an empty stomach.    Marland Kitchen atorvastatin (LIPITOR) 80 MG tablet Take 80 mg by mouth daily.    . Calcium Carbonate Antacid (TUMS PO) Take 1 tablet by mouth daily.    . fexofenadine (ALLEGRA) 180 MG tablet Take 180 mg by mouth daily.    . Magnesium 250 MG TABS Take 250 mg by mouth daily as needed (LEG CRAMPS). Reported on 05/01/2015    . metoprolol succinate (TOPROL-XL) 50 MG 24 hr tablet Take 50 mg by mouth 2 (two) times daily.    . nitroGLYCERIN (NITROSTAT) 0.4 MG SL tablet Place 0.4 mg under the tongue every 5 (five) minutes as needed for chest pain. Reported on 05/01/2015    . omeprazole (PRILOSEC) 40 MG capsule Take  40 mg by mouth daily.  3  . potassium chloride SA (K-DUR,KLOR-CON) 20 MEQ tablet Take 20 mEq by mouth 2 (two) times daily.    . solifenacin (VESICARE) 5 MG tablet One daily as needed (Patient taking differently: Take 5 mg by mouth daily. One daily as needed) 90 tablet 3  . triamterene-hydrochlorothiazide (MAXZIDE-25) 37.5-25 MG tablet Take 1 tablet by mouth daily.    . valACYclovir (VALTREX) 500 MG tablet Take 1 tablet (500 mg total) by mouth daily. Take one tablet by mouth twice a day for 3 days as needed for an outbreak. 120 tablet 3  . ZETIA 10 MG tablet Take 10 mg by mouth daily.  12   No current facility-administered medications for this visit.     Family History  Problem Relation Age of Onset  . Diabetes Mother        AODM  . Stroke Mother   . Diabetes Brother   . Stroke Father   . Colon cancer Neg Hx   . Colon polyps Neg Hx   . Esophageal cancer Neg Hx   . Rectal cancer Neg Hx   . Stomach cancer Neg Hx     Review of Systems  Constitutional: Negative.   HENT: Negative.   Eyes: Negative.   Respiratory: Negative.   Cardiovascular: Negative.   Gastrointestinal: Negative.   Endocrine: Negative.   Genitourinary:       Frequent UTIs  Musculoskeletal: Negative.   Skin: Negative.   Allergic/Immunologic: Negative.   Neurological: Negative.   Hematological: Negative.   Psychiatric/Behavioral: Negative.   All other systems reviewed and are negative.   Exam:   BP 122/74 (BP Location: Right Arm, Patient Position: Sitting, Cuff Size: Normal)   Pulse 100   Resp (!) 22   Ht 5\' 1"  (1.549 m)   Wt 174 lb 9.6 oz (79.2 kg)   LMP 04/11/1994   BMI 32.99 kg/m     General appearance: alert, cooperative and appears stated age Head: Normocephalic, without obvious abnormality, atraumatic Neck: no adenopathy, supple, symmetrical, trachea midline and thyroid normal to inspection and palpation Lungs: clear to auscultation bilaterally Heart: regular rate and rhythm Abdomen: soft,  non-tender; no masses, no organomegaly Extremities: extremities normal, atraumatic, no cyanosis or edema Skin: Skin color, texture, turgor normal. No rashes or lesion  No abnormal inguinal nodes palpated Neurologic: Grossly normal  Pelvic: External genitalia:  no lesions              Urethra:  normal appearing urethra with no masses, tenderness or lesions              Bartholins  and Skenes: normal                 Vagina: normal appearing vagina with normal color and discharge, no lesions              Cervix: no lesions          Bimanual Exam:  Uterus:  normal size, contour, position, consistency, mobility, non-tender              Adnexa: no mass, fullness, tenderness              Rectal exam: Yes.  .  Confirms.              Anus:  normal sphincter tone, no lesions  Chaperone was present for exam.  Assessment:     Recurrent UTI.  Overactive bladder.  On Vesicare.  Vaginal atrophy.  Hx HSV. Hx breast cancer.  Hx atrial fibrillation.  On Xarelto.   Plan:  We discussed recurrent UTI.  I do recommend she return to [facility] to have a consultation with a physician provider. She may be a candidate for cytoscopy and renal imaging.  Rx for Valtrex.  Cooking oils, vaginal vit E, and water based lubricants for hydration.  Will not be prescribing vaginal estrogen.  Follow up prn.   After visit summary provided.

## 2018-01-24 NOTE — Patient Instructions (Signed)
Consider vaginal vitamin E suppositories from [email or web address] to treat your vaginal dryness symptoms.   You will receive a phone call to set up an appointment with Dr. Matilde Sprang at [facility].  Thank you for visiting today!  Josefa Half, MD   Atrophic Vaginitis Atrophic vaginitis is a condition in which the tissues that line the vagina become dry and thin. This condition is most common in women who have stopped having regular menstrual periods (menopause). This usually starts when a woman is 13-85 years old. Estrogen helps to keep the vagina moist. It stimulates the vagina to produce a clear fluid that lubricates the vagina for sexual intercourse. This fluid also protects the vagina from infection. Lack of estrogen can cause the lining of the vagina to get thinner and dryer. The vagina may also shrink in size. It may become less elastic. Atrophic vaginitis tends to get worse over time as a woman's estrogen level drops. What are the causes? This condition is caused by the normal drop in estrogen that happens around the time of menopause. What increases the risk? Certain conditions or situations may lower a woman's estrogen level, which increases her risk of atrophic vaginitis. These include:  Taking medicine that blocks estrogen.  Having ovaries removed surgically.  Being treated for cancer with X-ray treatment (radiation) or medicines (chemotherapy).  Exercising very hard and often.  Having an eating disorder (anorexia).  Giving birth or breastfeeding.  Being over the age of 21.  Smoking.  What are the signs or symptoms? Symptoms of this condition include:  Pain, soreness, or bleeding during sexual intercourse (dyspareunia).  Vaginal burning, irritation, or itching.  Pain or bleeding during a vaginal examination using a speculum (pelvic exam).  Loss of interest in sexual activity.  Having burning pain when passing urine.  Vaginal discharge that is brown or yellow.  In  some cases, there are no symptoms. How is this diagnosed? This condition is diagnosed with a medical history and physical exam. This will include a pelvic exam that checks whether the inside of your vagina appears pale, thin, or dry. Rarely, you may also have other tests, including:  A urine test.  A test that checks the acid balance in your vaginal fluid (acid balance test).  How is this treated? Treatment for this condition may depend on the severity of your symptoms. Treatment may include:  Using an over-the-counter vaginal lubricant before you have sexual intercourse.  Using a long-acting vaginal moisturizer.  Using low-dose vaginal estrogen for moderate to severe symptoms that do not respond to other treatments. Options include creams, tablets, and inserts (vaginal rings). Before using vaginal estrogen, tell your health care provider if you have a history of: ? Breast cancer. ? Endometrial cancer. ? Blood clots.  Taking medicines. You may be able to take a daily pill for dyspareunia. Discuss all of the risks of this medicine with your health care provider. It is usually not recommended for women who have a family history or personal history of breast cancer.  If your symptoms are very mild and you are not sexually active, you may not need treatment. Follow these instructions at home:  Take medicines only as directed by your health care provider. Do not use herbal or alternative medicines unless your health care provider says that you can.  Use over-the-counter creams, lubricants, or moisturizers for dryness only as directed by your health care provider.  If your atrophic vaginitis is caused by menopause, discuss all of your menopausal symptoms and treatment  options with your health care provider.  Do not douche.  Do not use products that can make your vagina dry. These include: ? Scented feminine sprays. ? Scented tampons. ? Scented soaps.  If it hurts to have sex, talk  with your sexual partner. Contact a health care provider if:  Your discharge looks different than normal.  Your vagina has an unusual smell.  You have new symptoms.  Your symptoms do not improve with treatment.  Your symptoms get worse. This information is not intended to replace advice given to you by your health care provider. Make sure you discuss any questions you have with your health care provider. Document Released: 08/12/2014 Document Revised: 09/03/2015 Document Reviewed: 03/19/2014 Elsevier Interactive Patient Education  [employer].

## 2018-01-26 ENCOUNTER — Encounter: Payer: Self-pay | Admitting: Obstetrics and Gynecology

## 2018-02-12 DIAGNOSIS — H26493 Other secondary cataract, bilateral: Secondary | ICD-10-CM | POA: Diagnosis not present

## 2018-02-12 DIAGNOSIS — Z961 Presence of intraocular lens: Secondary | ICD-10-CM | POA: Diagnosis not present

## 2018-02-12 DIAGNOSIS — H04123 Dry eye syndrome of bilateral lacrimal glands: Secondary | ICD-10-CM | POA: Diagnosis not present

## 2018-02-12 DIAGNOSIS — H35371 Puckering of macula, right eye: Secondary | ICD-10-CM | POA: Diagnosis not present

## 2018-02-13 ENCOUNTER — Telehealth: Payer: Self-pay | Admitting: *Deleted

## 2018-02-13 NOTE — Telephone Encounter (Signed)
Left message for pt to call us back to make appointment with one of our cardiologist during Dr. Maurice Small absence.

## 2018-02-16 DIAGNOSIS — N952 Postmenopausal atrophic vaginitis: Secondary | ICD-10-CM | POA: Diagnosis not present

## 2018-02-16 DIAGNOSIS — R350 Frequency of micturition: Secondary | ICD-10-CM | POA: Diagnosis not present

## 2018-03-26 ENCOUNTER — Other Ambulatory Visit: Payer: Self-pay | Admitting: Cardiology

## 2018-03-26 MED ORDER — ATORVASTATIN CALCIUM 80 MG PO TABS: 80.0000 mg | ORAL_TABLET | Freq: Every day | ORAL | 0 refills | Status: DC

## 2018-03-26 NOTE — Telephone Encounter (Signed)
° °  1. Which medications need to be refilled? (please list name of each medication and dose if known) Atorvastatin 80mg  tablet 1QD  2. Which pharmacy/location (including street and city if local pharmacy) is medication to be sent to?[facility]  3. Do they need a 30 day or 90 day supply? [place]

## 2018-03-26 NOTE — Telephone Encounter (Signed)
rx sent to pharmacy as requested.  Patient is scheduled to see RJK on 03-30-18.

## 2018-03-30 ENCOUNTER — Encounter: Payer: Self-pay | Admitting: Cardiology

## 2018-03-30 ENCOUNTER — Ambulatory Visit (INDEPENDENT_AMBULATORY_CARE_PROVIDER_SITE_OTHER): Payer: Medicare Other | Admitting: Cardiology

## 2018-03-30 DIAGNOSIS — Z951 Presence of aortocoronary bypass graft: Secondary | ICD-10-CM

## 2018-03-30 DIAGNOSIS — I4892 Unspecified atrial flutter: Secondary | ICD-10-CM | POA: Diagnosis not present

## 2018-03-30 DIAGNOSIS — E785 Hyperlipidemia, unspecified: Secondary | ICD-10-CM | POA: Diagnosis not present

## 2018-03-30 DIAGNOSIS — I2510 Atherosclerotic heart disease of native coronary artery without angina pectoris: Secondary | ICD-10-CM | POA: Diagnosis not present

## 2018-03-30 NOTE — Addendum Note (Signed)
Addended by: Ashok Norris on: 03/30/2018 10:49 AM   Modules accepted: Orders

## 2018-03-30 NOTE — Patient Instructions (Signed)
Medication Instructions:  Your physician recommends that you continue on your current medications as directed. Please refer to the Current Medication list given to you today.  If you need a refill on your cardiac medications before your next appointment, please call your pharmacy.   Lab work: None.  If you have labs (blood work) drawn today and your tests are completely normal, you will receive your results only by: Marland Kitchen MyChart Message (if you have MyChart) OR . A paper copy in the mail If you have any lab test that is abnormal or we need to change your treatment, we will call you to review the results.  Testing/Procedures: Your physician has requested that you have an echocardiogram. Echocardiography is a painless test that uses sound waves to create images of your heart. It provides your doctor with information about the size and shape of your heart and how well your heart's chambers and valves are working. This procedure takes approximately one hour. There are no restrictions for this procedure.  Your physician has recommended that you wear an event monitor. Event monitors are medical devices that record the heart's electrical activity. Doctors most often [place] these monitors to diagnose arrhythmias. Arrhythmias are problems with the speed or rhythm of the heartbeat. The monitor is a small, portable device. You can wear one while you do your normal daily activities. This is usually used to diagnose what is causing palpitations/syncope (passing out). Wear for 30 days     Follow-Up: At [facility], you and your health needs are our priority.  As part of our continuing mission to provide you with exceptional heart care, we have created designated Provider Care Teams.  These Care Teams include your primary Cardiologist (physician) and Advanced Practice Providers (APPs -  Physician Assistants and Nurse Practitioners) who all work together to provide you with the care you need, when you need it. You  will need a follow up appointment in 5 months.  Please call our office 2 months in advance to schedule this appointment.  You may see No primary care provider on file. or another member of our [employer] Provider Team in [place]: Shirlee More, MD . Jyl Heinz, MD  Any Other Special Instructions Will Be Listed Below (If Applicable).   Echocardiogram An echocardiogram is a procedure that uses painless sound waves (ultrasound) to produce an image of the heart. Images from an echocardiogram can provide important information about:  Signs of coronary artery disease (CAD).  Aneurysm detection. An aneurysm is a weak or damaged part of an artery wall that bulges out from the normal force of blood pumping through the body.  Heart size and shape. Changes in the size or shape of the heart can be associated with certain conditions, including heart failure, aneurysm, and CAD.  Heart muscle function.  Heart valve function.  Signs of a past heart attack.  Fluid buildup around the heart.  Thickening of the heart muscle.  A tumor or infectious growth around the heart valves. Tell a health care provider about:  Any allergies you have.  All medicines you are taking, including vitamins, herbs, eye drops, creams, and over-the-counter medicines.  Any blood disorders you have.  Any surgeries you have had.  Any medical conditions you have.  Whether you are pregnant or may be pregnant. What are the risks? Generally, this is a safe procedure. However, problems may occur, including:  Allergic reaction to dye (contrast) that may be used during the procedure. What happens before the  procedure? No specific preparation is needed. You may eat and drink normally. What happens during the procedure?   An IV tube may be inserted into one of your veins.  You may receive contrast through this tube. A contrast is an injection that improves the quality of the pictures from your heart.  A gel  will be applied to your chest.  A wand-like tool (transducer) will be moved over your chest. The gel will help to transmit the sound waves from the transducer.  The sound waves will harmlessly bounce off of your heart to allow the heart images to be captured in real-time motion. The images will be recorded on a computer. The procedure may vary among health care providers and hospitals. What happens after the procedure?  You may return to your normal, everyday life, including diet, activities, and medicines, unless your health care provider tells you not to do that. Summary  An echocardiogram is a procedure that uses painless sound waves (ultrasound) to produce an image of the heart.  Images from an echocardiogram can provide important information about the size and shape of your heart, heart muscle function, heart valve function, and fluid buildup around your heart.  You do not need to do anything to prepare before this procedure. You may eat and drink normally.  After the echocardiogram is completed, you may return to your normal, everyday life, unless your health care provider tells you not to do that. This information is not intended to replace advice given to you by your health care provider. Make sure you discuss any questions you have with your health care provider. Document Released: 03/25/2000 Document Revised: 04/30/2016 Document Reviewed: 04/30/2016 Elsevier Interactive Patient Education  2019 [place].    Ambulatory Cardiac Monitoring An ambulatory cardiac monitor is a small recording device that is used to detect abnormal heart rhythms (arrhythmias). Most monitors are connected by wires to flat, sticky disks (electrodes) that are then attached to your chest. You may need to wear a monitor if you have had symptoms such as:  Fast heartbeats (palpitations).  Dizziness.  Fainting or light-headedness.  Unexplained weakness.  Shortness of breath. There are several types  of monitors. Some common monitors include:  Holter monitor. This records your heart rhythm continuously, usually for 24-48 hours.  Event (episodic) monitor. This monitor has a symptoms button, and when pushed, it will begin recording. You need to activate this monitor to record when you have a heart-related symptom.  Automatic detection monitor. This monitor will begin recording when it detects an abnormal heartbeat. What are the risks? Generally, these devices are safe to use. However, it is possible that the skin under the electrodes will become irritated. How to prepare for monitoring Your health care provider will prepare your chest for the electrode placement and show you how to use the monitor.  Do not apply lotions to your chest before monitoring.  Follow directions on how to care for the monitor, and how to return the monitor when the testing period is complete. How to use your cardiac monitor  Follow directions about how long to wear the monitor, and if you can take the monitor off in order to shower or bathe. ? Do not let the monitor get wet. ? Do not bathe, swim, or use a hot tub while wearing the monitor.  Keep your skin clean. Do not put body lotion or moisturizer on your chest.  Change the electrodes as told by your health care provider, or any time  they stop sticking to your skin. You may need to use medical tape to keep them on.  Try to put the electrodes in slightly different places on your chest to help prevent skin irritation. Follow directions from your health care provider about where to place the electrodes.  Make sure the monitor is safely clipped to your clothing or in a location close to your body as recommended by your health care provider.  If your monitor has a symptoms button, press the button to mark an event as soon as you feel a heart-related symptom, such as: ? Dizziness. ? Weakness. ? Light-headedness. ? Palpitations. ? Thumping or pounding in your  chest. ? Shortness of breath. ? Unexplained weakness.  Keep a diary of your activities, such as walking, doing chores, and taking medicine. It is very important to note what you were doing when you pushed the button to record your symptoms. This will help your health care provider determine what might be contributing to your symptoms.  Send the recorded information as recommended by your health care provider. It may take some time for your health care provider to process the results.  Change the batteries as told by your health care provider.  Keep electronic devices away from your monitor. These include: ? Tablets. ? MP3 players. ? Cell phones.  While wearing your monitor you should avoid: ? Electric blankets. ? Armed forces operational officer. ? Electric toothbrushes. ? Microwave ovens. ? Magnets. ? Metal detectors. Get help right away if:  You have chest pain.  You have shortness of breath or extreme difficulty breathing.  You develop a very fast heartbeat that does not get better.  You develop dizziness that does not go away.  You faint or constantly feel like you are about to faint. Summary  An ambulatory cardiac monitor is a small recording device that is used to detect abnormal heart rhythms (arrhythmias).  Make sure you understand how to send the information from the monitor to your health care provider.  It is important to press the button on the monitor when you have any heart-related symptoms.  Keep a diary of your activities, such as walking, doing chores, and taking medicine. It is very important to note what you were doing when you pushed the button to record your symptoms. This will help your health care provider learn what might be causing your symptoms. This information is not intended to replace advice given to you by your health care provider. Make sure you discuss any questions you have with your health care provider. Document Released: 01/05/2008 Document Revised:  01/11/2017 Document Reviewed: 03/12/2016 Elsevier Interactive Patient Education  2019 [employer].

## 2018-03-30 NOTE — Progress Notes (Signed)
Cardiology Office Note:    Date:  03/30/2018   ID:  EULLA Watts, DOB 1941-01-13, MRN [id number]  PCP:  [name], MD  Cardiologist:  Jenne Campus, MD    Referring MD: [name], MD   Chief Complaint  Patient presents with  . Follow-up  Doing well  History of Present Illness:    Mallory Watts is a 77 y.o. female who is patient of Dr. [name], she does have past medical history significant for coronary artery disease status post coronary artery bypass graft.  Also one documented episode of atrial flutter which was in 2010 look like after that there was no more episodes.  She is not anticoagulated and I understand the reason for that is the fact that there is no recurrences of arrhythmias.  She felt her atrial flutter when she had it since the time of that arrhythmia there is no more palpitations.  Recently she had knee replacement surgery done she started getting back to shape she does tai chi and enjoyed.  No chest pain tightness squeezing pressure burning chest she is going to [place] for Christmas.  She is looking forward to it.  Past Medical History:  Diagnosis Date  . Arthritis   . Atrial fibrillation ([place])    not symptomatic -on metoprolol BID for this   . Cancer ([place]) 1998   breast cancer--left  . Dysrhythmia    atrial flutter/fib  . GERD (gastroesophageal reflux disease)   . Hypertension   . Osteopenia   . Personal history of chemotherapy 1998  . Personal history of radiation therapy 1998  . PONV (postoperative nausea and vomiting)   . Shortness of breath    exertion  . STD (sexually transmitted disease)    HSV  . Urinary incontinence     Past Surgical History:  Procedure Laterality Date  . ANTERIOR CERVICAL DECOMP/DISCECTOMY FUSION N/A 01/14/2014   Procedure: ANTERIOR CERVICAL DECOMPRESSION/DISCECTOMY FUSION CERVICAL FOUR-FIVE,CERVICAL FIVE SIX;  Surgeon: [name], MD;  Location: [facility];  Service: Neurosurgery;  Laterality: N/A;  .  BREAST EXCISIONAL BIOPSY Left   . BREAST LUMPECTOMY Left 1998  . BREAST SURGERY  1998   left lumpectomy for breast ca  . COLONOSCOPY    . CORONARY ARTERY BYPASS GRAFT  2004   at [place]  . DILATION AND CURETTAGE OF UTERUS  2006  . heart bypass  2003   -single  . POLYPECTOMY    . TONSILLECTOMY    . TOTAL KNEE ARTHROPLASTY Left 11/28/2016   Procedure: LEFT TOTAL KNEE ARTHROPLASTY;  Surgeon: [name], MD;  Location: [facility];  Service: Orthopedics;  Laterality: Left;  Canal block    Current Medications: Current Meds  Medication Sig  . acetaminophen (TYLENOL) 500 MG tablet Take 500 mg by mouth every 6 (six) hours as needed.  Marland Kitchen acetaminophen (TYLENOL) 650 MG CR tablet Take 1,300 mg by mouth 2 (two) times daily as needed for pain.  Marland Kitchen alendronate (FOSAMAX) 70 MG tablet Take 70 mg by mouth once a week. Take with a full glass of water on an empty stomach.  Marland Kitchen atorvastatin (LIPITOR) 80 MG tablet Take 1 tablet (80 mg total) by mouth daily.  . Calcium Carbonate Antacid (TUMS PO) Take 1 tablet by mouth daily.  . fexofenadine (ALLEGRA) 180 MG tablet Take 180 mg by mouth daily.  . Magnesium 250 MG TABS Take 250 mg by mouth daily as needed (LEG CRAMPS). Reported on 05/01/2015  . metoprolol succinate (TOPROL-XL) 50 MG 24 hr  tablet Take 50 mg by mouth 2 (two) times daily.  . nitroGLYCERIN (NITROSTAT) 0.4 MG SL tablet Place 0.4 mg under the tongue every 5 (five) minutes as needed for chest pain. Reported on 05/01/2015  . omeprazole (PRILOSEC) 40 MG capsule Take 40 mg by mouth daily.  . potassium chloride SA (K-DUR,KLOR-CON) 20 MEQ tablet Take 20 mEq by mouth 2 (two) times daily.  . solifenacin (VESICARE) 5 MG tablet One daily as needed (Patient taking differently: Take 5 mg by mouth daily. One daily as needed)  . triamterene-hydrochlorothiazide (MAXZIDE-25) 37.5-25 MG tablet Take 1 tablet by mouth daily.  . valACYclovir (VALTREX) 500 MG tablet Take 1 tablet (500 mg total) by mouth daily. Take one tablet by  mouth twice a day for 3 days as needed for an outbreak.  Marland Kitchen ZETIA 10 MG tablet Take 10 mg by mouth daily.     Allergies:   Gadolinium and Sulfonamide derivatives   Social History   Socioeconomic History  . Marital status: Single    Spouse name: Not on file  . Number of children: Not on file  . Years of education: Not on file  . Highest education level: Not on file  Occupational History  . Not on file  Social Needs  . Financial resource strain: Not on file  . Food insecurity:    Worry: Not on file    Inability: Not on file  . Transportation needs:    Medical: Not on file    Non-medical: Not on file  Tobacco Use  . Smoking status: Former Research scientist (life sciences)  . Smokeless tobacco: Never Used  . Tobacco comment: smoked in college   Substance and Sexual Activity  . Alcohol use: Yes    Alcohol/week: 2.0 - 3.0 standard drinks    Types: 1 Glasses of wine, 1 - 2 Standard drinks or equivalent per week    Comment: occasionally  . Drug use: No  . Sexual activity: Not Currently    Partners: Male    Birth control/protection: Post-menopausal  Lifestyle  . Physical activity:    Days per week: Not on file    Minutes per session: Not on file  . Stress: Not on file  Relationships  . Social connections:    Talks on phone: Not on file    Gets together: Not on file    Attends religious service: Not on file    Active member of club or organization: Not on file    Attends meetings of clubs or organizations: Not on file    Relationship status: Not on file  Other Topics Concern  . Not on file  Social History Narrative  . Not on file     Family History: The patient's family history includes Diabetes in her brother and mother; Stroke in her father and mother. There is no history of Colon cancer, Colon polyps, Esophageal cancer, Rectal cancer, or Stomach cancer. ROS:   Please see the history of present illness.    All 14 point review of systems negative except as described per history of present  illness  EKGs/Labs/Other Studies Reviewed:      Recent Labs: No results found for requested labs within last 8760 hours.  Recent Lipid Panel No results found for: CHOL, TRIG, HDL, CHOLHDL, VLDL, LDLCALC, LDLDIRECT  Physical Exam:    VS:  BP (!) 150/76   Pulse 71   Ht 5' 2"  (1.575 m)   Wt 174 lb 6.4 oz (79.1 kg)   LMP 04/11/1994   SpO2  97%   BMI 31.90 kg/m     Wt Readings from Last 3 Encounters:  03/30/18 174 lb 6.4 oz (79.1 kg)  01/24/18 174 lb 9.6 oz (79.2 kg)  11/28/16 165 lb (74.8 kg)     GEN:  Well nourished, well developed in no acute distress HEENT: Normal NECK: No JVD; No carotid bruits LYMPHATICS: No lymphadenopathy CARDIAC: RRR, no murmurs, no rubs, no gallops RESPIRATORY:  Clear to auscultation without rales, wheezing or rhonchi  ABDOMEN: Soft, non-tender, non-distended MUSCULOSKELETAL:  No edema; No deformity  SKIN: Warm and dry LOWER EXTREMITIES: no swelling NEUROLOGIC:  Alert and oriented x 3 PSYCHIATRIC:  Normal affect   ASSESSMENT:    1. Status post coronary artery bypass graft   2. Coronary artery disease involving native coronary artery of native heart without angina pectoris   3. Paroxysmal atrial flutter ([place])   4. Dyslipidemia    PLAN:    In order of problems listed above:  1. Status post coronary artery bypass graft stable asymptomatic. 2. Coronary artery disease on appropriate medications which will continue 3. Paroxysmal atrial flutter one documented episodes I will ask her to have echocardiogram to assess left atrial size I will also ask her to wear monitor for month to see if there is any significant arrhythmia she wants to have it done in January. 4. Dyslipidemia last fasting lipid profile excellent.   Medication Adjustments/Labs and Tests Ordered: Current medicines are reviewed at length with the patient today.  Concerns regarding medicines are outlined above.  No orders of the defined types were placed in this  encounter.  Medication changes: No orders of the defined types were placed in this encounter.   Signed, Park Liter, MD, [facility] 03/30/2018 10:38 AM    [place]

## 2018-04-09 ENCOUNTER — Telehealth: Payer: Self-pay | Admitting: Cardiology

## 2018-04-09 DIAGNOSIS — R509 Fever, unspecified: Secondary | ICD-10-CM | POA: Diagnosis not present

## 2018-04-09 DIAGNOSIS — R05 Cough: Secondary | ICD-10-CM | POA: Diagnosis not present

## 2018-04-09 DIAGNOSIS — J0100 Acute maxillary sinusitis, unspecified: Secondary | ICD-10-CM | POA: Diagnosis not present

## 2018-04-09 DIAGNOSIS — I10 Essential (primary) hypertension: Secondary | ICD-10-CM | POA: Diagnosis not present

## 2018-04-09 DIAGNOSIS — J309 Allergic rhinitis, unspecified: Secondary | ICD-10-CM | POA: Diagnosis not present

## 2018-04-09 MED ORDER — NITROGLYCERIN 0.4 MG SL SUBL: 0.4000 mg | SUBLINGUAL_TABLET | SUBLINGUAL | 11 refills | Status: DC | PRN

## 2018-04-09 NOTE — Telephone Encounter (Signed)
° °  1. Which medications need to be refilled? (please list name of each medication and dose if known) Nitroglycerin 0.4mg  tablets  2. Which pharmacy/location (including street and city if local pharmacy) is medication to be sent to[facility] [phone]  3. Do they need a 30 day or 90 day supply? [place]

## 2018-04-09 NOTE — Telephone Encounter (Signed)
Nitroglycerin 0.4 mg tablets as needed for chest pain refilled

## 2018-04-16 ENCOUNTER — Telehealth: Payer: Self-pay | Admitting: Cardiology

## 2018-04-16 NOTE — Telephone Encounter (Signed)
Patient has questions about medications she is taking and testing upcoming. Please advise

## 2018-04-16 NOTE — Telephone Encounter (Signed)
Spoke with patient, she was recently given Prednisone and will complete it tonight. She is scheduled for testing in a week. She wanted to verify that she would be okay to continue testing, I did advise her that after being off of it for week she would be okay to continue with testing.

## 2018-04-20 ENCOUNTER — Ambulatory Visit (HOSPITAL_COMMUNITY): Payer: Medicare Other | Attending: Cardiovascular Disease

## 2018-04-20 DIAGNOSIS — I2510 Atherosclerotic heart disease of native coronary artery without angina pectoris: Secondary | ICD-10-CM | POA: Diagnosis not present

## 2018-04-20 DIAGNOSIS — Z951 Presence of aortocoronary bypass graft: Secondary | ICD-10-CM | POA: Insufficient documentation

## 2018-04-20 DIAGNOSIS — I4892 Unspecified atrial flutter: Secondary | ICD-10-CM | POA: Insufficient documentation

## 2018-04-24 ENCOUNTER — Ambulatory Visit (INDEPENDENT_AMBULATORY_CARE_PROVIDER_SITE_OTHER): Payer: Medicare Other

## 2018-04-24 ENCOUNTER — Telehealth: Payer: Self-pay | Admitting: Emergency Medicine

## 2018-04-24 DIAGNOSIS — I2510 Atherosclerotic heart disease of native coronary artery without angina pectoris: Secondary | ICD-10-CM

## 2018-04-24 DIAGNOSIS — I4892 Unspecified atrial flutter: Secondary | ICD-10-CM | POA: Diagnosis not present

## 2018-04-24 DIAGNOSIS — Z951 Presence of aortocoronary bypass graft: Secondary | ICD-10-CM | POA: Diagnosis not present

## 2018-04-24 NOTE — Telephone Encounter (Signed)
Rhythm strip at your desk

## 2018-04-24 NOTE — Telephone Encounter (Signed)
[employer] called reporting an abnormal rhythm today at 12:15 pm stating the patient was in atrial flutter. Will route to Dr. [name] for further direction.

## 2018-04-24 NOTE — Telephone Encounter (Signed)
Can I see the rhythmm strip showing it

## 2018-04-25 DIAGNOSIS — I4892 Unspecified atrial flutter: Secondary | ICD-10-CM | POA: Diagnosis not present

## 2018-04-25 NOTE — Telephone Encounter (Signed)
Left message for patient to return call.

## 2018-04-25 NOTE — Addendum Note (Signed)
Addended by: Ashok Norris on: 04/25/2018 11:49 AM   Modules accepted: Orders

## 2018-04-25 NOTE — Telephone Encounter (Signed)
Patient informed of atrial flutter event on monitor. Advised patient to have labs drawn because Dr. [name] plans to start on a blood thinner. Patient verbally understands, she will have labs drawn at closest [place].

## 2018-04-26 ENCOUNTER — Other Ambulatory Visit: Payer: Self-pay

## 2018-04-26 LAB — BASIC METABOLIC PANEL
BUN / CREAT RATIO: 21 (ref 12–28)
BUN: 24 mg/dL (ref 8–27)
CALCIUM: 9.4 mg/dL (ref 8.7–10.3)
CHLORIDE: 102 mmol/L (ref 96–106)
CO2: 23 mmol/L (ref 20–29)
Creatinine, Ser: 1.14 mg/dL — ABNORMAL HIGH (ref 0.57–1.00)
GFR calc Af Amer: 54 mL/min/{1.73_m2} — ABNORMAL LOW (ref >59)
GFR calc non Af Amer: 47 mL/min/{1.73_m2} — ABNORMAL LOW (ref >59)
GLUCOSE: 77 mg/dL (ref 65–99)
Potassium: 4.1 mmol/L (ref 3.5–5.2)
Sodium: 140 mmol/L (ref 134–144)

## 2018-04-26 LAB — PROTIME-INR
INR: 1 (ref 0.8–1.2)
Prothrombin Time: 10.6 s (ref 9.1–12.0)

## 2018-04-28 LAB — FECAL OCCULT BLOOD, IMMUNOCHEMICAL: Fecal Occult Bld: NEGATIVE

## 2018-04-30 DIAGNOSIS — H26492 Other secondary cataract, left eye: Secondary | ICD-10-CM | POA: Diagnosis not present

## 2018-05-09 ENCOUNTER — Other Ambulatory Visit: Payer: Self-pay | Admitting: Cardiology

## 2018-05-09 MED ORDER — METOPROLOL SUCCINATE ER 50 MG PO TB24: 50.0000 mg | ORAL_TABLET | Freq: Two times a day (BID) | ORAL | 0 refills | Status: DC

## 2018-05-09 NOTE — Telephone Encounter (Signed)
[facility] [place] as requested.

## 2018-05-09 NOTE — Telephone Encounter (Signed)
1. Which medications need to be refilled? (please list name of each medication and dose if known) Metoprolol succ ER 24HR 50mg  BID  2. Which pharmacy/location (including street and city if local pharmacy) is medication to [facility] sent [id number]  3. Do they need a 30 day or 90 day supply? [place]

## 2018-05-10 ENCOUNTER — Telehealth: Payer: Self-pay | Admitting: Cardiology

## 2018-05-10 NOTE — Telephone Encounter (Signed)
New Message       Patient is calling today because she states her heart monitor mini strip is not registering, she thinks she needs more. Pls call and advise

## 2018-05-10 NOTE — Telephone Encounter (Signed)
Instructed patient to call [employer] directly to request more strips.  Spoke with patient regarding assuring 2nd monitor was completely shut off.  [employer] will be able to confirm she is transmitting.  I will leave a strip and prep pad for her to pick up at check in desk 05/11/18.

## 2018-05-28 DIAGNOSIS — N952 Postmenopausal atrophic vaginitis: Secondary | ICD-10-CM | POA: Diagnosis not present

## 2018-05-28 DIAGNOSIS — R351 Nocturia: Secondary | ICD-10-CM | POA: Diagnosis not present

## 2018-05-28 DIAGNOSIS — R300 Dysuria: Secondary | ICD-10-CM | POA: Diagnosis not present

## 2018-05-30 ENCOUNTER — Telehealth: Payer: Self-pay | Admitting: Cardiology

## 2018-05-30 NOTE — Telephone Encounter (Signed)
Patient re informed of monitor results

## 2018-05-30 NOTE — Telephone Encounter (Signed)
Please advise of this change.

## 2018-05-30 NOTE — Telephone Encounter (Signed)
Please schedule for next available appointment with Dr. Harrington Challenger.

## 2018-05-30 NOTE — Telephone Encounter (Signed)
I will route this to Dr. Harrington Challenger nurse Caren Hazy so that she may d/w Dr. Harrington Challenger.

## 2018-05-30 NOTE — Telephone Encounter (Signed)
Pt is currently being seen by Dr. [name] in [place], but would like to be seen by Dr. Harrington Challenger at the  [facility] office, because it is closer to her house.

## 2018-05-30 NOTE — Telephone Encounter (Signed)
Dr. Harrington Challenger, patient requesting to change primary cardiologists due to location.

## 2018-05-30 NOTE — Telephone Encounter (Signed)
Pt had an appointment on 06/08/18 to discuss monitor results , but had to cancel. She is in the process of transitioning to a Cardiologist closer to home. She would still like someone to call her to discuss her results once they come in.

## 2018-05-30 NOTE — Telephone Encounter (Signed)
Fine with me

## 2018-05-30 NOTE — Telephone Encounter (Signed)
OK to switch 

## 2018-06-05 DIAGNOSIS — I119 Hypertensive heart disease without heart failure: Secondary | ICD-10-CM | POA: Diagnosis not present

## 2018-06-05 DIAGNOSIS — N183 Chronic kidney disease, stage 3 (moderate): Secondary | ICD-10-CM | POA: Diagnosis not present

## 2018-06-05 DIAGNOSIS — I2510 Atherosclerotic heart disease of native coronary artery without angina pectoris: Secondary | ICD-10-CM | POA: Diagnosis not present

## 2018-06-05 DIAGNOSIS — Z853 Personal history of malignant neoplasm of breast: Secondary | ICD-10-CM | POA: Diagnosis not present

## 2018-06-05 DIAGNOSIS — Z6831 Body mass index (BMI) 31.0-31.9, adult: Secondary | ICD-10-CM | POA: Diagnosis not present

## 2018-06-05 DIAGNOSIS — E668 Other obesity: Secondary | ICD-10-CM | POA: Diagnosis not present

## 2018-06-05 DIAGNOSIS — Z9189 Other specified personal risk factors, not elsewhere classified: Secondary | ICD-10-CM | POA: Diagnosis not present

## 2018-06-05 DIAGNOSIS — I1310 Hypertensive heart and chronic kidney disease without heart failure, with stage 1 through stage 4 chronic kidney disease, or unspecified chronic kidney disease: Secondary | ICD-10-CM | POA: Diagnosis not present

## 2018-06-05 DIAGNOSIS — I480 Paroxysmal atrial fibrillation: Secondary | ICD-10-CM | POA: Diagnosis not present

## 2018-06-08 ENCOUNTER — Ambulatory Visit: Payer: Medicare Other | Admitting: Cardiology

## 2018-06-17 ENCOUNTER — Other Ambulatory Visit: Payer: Self-pay | Admitting: Cardiology

## 2018-07-03 ENCOUNTER — Encounter: Payer: Self-pay | Admitting: Cardiology

## 2018-07-03 ENCOUNTER — Telehealth (INDEPENDENT_AMBULATORY_CARE_PROVIDER_SITE_OTHER): Payer: Medicare Other | Admitting: Cardiology

## 2018-07-03 ENCOUNTER — Other Ambulatory Visit: Payer: Self-pay

## 2018-07-03 VITALS — Ht 62.0 in | Wt 174.0 lb

## 2018-07-03 DIAGNOSIS — Z79899 Other long term (current) drug therapy: Secondary | ICD-10-CM | POA: Diagnosis not present

## 2018-07-03 MED ORDER — ASPIRIN EC 81 MG PO TBEC: 81.0000 mg | DELAYED_RELEASE_TABLET | Freq: Every day | ORAL | 3 refills | Status: DC

## 2018-07-03 MED ORDER — RIVAROXABAN 20 MG PO TABS: 20.0000 mg | ORAL_TABLET | Freq: Every day | ORAL | 3 refills | Status: DC

## 2018-07-03 NOTE — Progress Notes (Signed)
Tele-Health Visit     Evaluation Performed:  Follow-up visit  This visit type was conducted due to national recommendations for restrictions regarding the COVID-19 Pandemic (e.g. social distancing).  This format is felt to be most appropriate for this patient at this time.  All issues noted in this document were discussed and addressed.  No physical exam was performed (except for noted visual exam findings with Telehealth visits).  See MyChart message from today for the patient's consent to telehealth for [facility].  Date:  07/03/2018   ID:  Mallory Watts, DOB Oct 07, 1940, MRN [id number]  Patient Location:  [place] [id number]   Provider location:   [place], [place], [place]  PCP:  [name], MD  Cardiologist:  [name], MD  Electrophysiologist:  None   Chief Complaint:  Atrial flutter  History of Present Illness:    Mallory Watts is a 78 y.o. female who presents via audio/video conferencing for a telehealth visit today.  She was previously seen by Dr [name] and then followed up with Dr. [name]. She has hx of CABG 2004 and remote hx of afib. 30 day monitor was ordered by Dr. [name] which showed 4% burden of atrial flutter. The patient prefers to be seen in [place] so was accepted by Dr. Harrington Challenger.   She is being evaluated today by telephone visit for her monitor results and initiation of anticoagulation.  Ms. Rosano denies palpitations, chest pain/pressure/tightness, lightheadedness or syncope. She has no unusual shortness of breath. She has mild DOE with walking up 2 flights of stairs. She has not orthopnea, PND or edema. She is independent and has a live in boyfriend. She is able to do all of her daily activities without problems. She is pleasant and interactive on the phone and was very patient as we attempted to do a virtual visit but could not get this working.   She denies any bleeding history or stroke.   Labs per Dr. Jenny Reichmann  [facility] from 06/05/2018: SCr 1.3, K+ 3.8  The patient does not symptoms concerning for COVID-19 infection (fever, chills, cough, or new SHORTNESS OF BREATH).    Prior CV studies:   The following studies were reviewed today:  Cardiac event monitor 04/24/2018 Patient was monitored from 24 April 2018 to 23 May 2018. Indication:                    Atrial flutter Ordering physician:         Park Liter, MD Referring physician:        Lessie Dings, MD  Baseline rhythm: Sinus rhythm  Atrial arrhythmia: Multiple episode of atrial flutter with controlled ventricular rate  Ventricular arrhythmia: Infrequent PVCs Conduction abnormality: None Symptoms: None  Conclusion:  Paroxysmal atrial flutter with controlled ventricular rate  Echocardiogram 04/20/2018 Study Conclusions  - Left ventricle: The cavity size was normal. Systolic function was   normal. The estimated ejection fraction was in the range of 60%   to 65%. Wall motion was normal; there were no regional wall   motion abnormalities. Doppler parameters are consistent with   abnormal left ventricular relaxation (grade 1 diastolic   dysfunction). Doppler parameters are consistent with   indeterminate ventricular filling pressure. - Aortic valve: Transvalvular velocity was within the normal range.   There was no stenosis. There was moderate regurgitation.   Regurgitation pressure half-time: 364 ms. - Aorta: Ascending aortic diameter: 36 mm (S). -  Ascending aorta: The ascending aorta was mildly dilated. - Mitral valve: Mildly calcified annulus. Transvalvular velocity   was within the normal range. There was no evidence for stenosis.   There was no regurgitation. - Left atrium: The atrium was severely dilated. - Right ventricle: The cavity size was normal. Wall thickness was   normal. Systolic function was normal. - Tricuspid valve: There was mild regurgitation. - Pulmonary arteries: Systolic pressure  was within the normal   range. PA peak pressure: 28 mm Hg (S).  Past Medical History:  Diagnosis Date  . Arthritis   . Atrial fibrillation ([place])    not symptomatic -on metoprolol BID for this   . CAD (coronary artery disease)    s/p CAGB 2004 at [facility]  . Cancer ([place]) 1998   breast cancer--left  . Dysrhythmia    atrial flutter/fib  . GERD (gastroesophageal reflux disease)   . Hypertension   . Osteopenia   . Personal history of chemotherapy 1998  . Personal history of radiation therapy 1998  . PONV (postoperative nausea and vomiting)   . Shortness of breath    exertion  . STD (sexually transmitted disease)    HSV  . Urinary incontinence    Past Surgical History:  Procedure Laterality Date  . ANTERIOR CERVICAL DECOMP/DISCECTOMY FUSION N/A 01/14/2014   Procedure: ANTERIOR CERVICAL DECOMPRESSION/DISCECTOMY FUSION CERVICAL FOUR-FIVE,CERVICAL FIVE SIX;  Surgeon: [name], MD;  Location: [facility];  Service: Neurosurgery;  Laterality: N/A;  . BREAST EXCISIONAL BIOPSY Left   . BREAST LUMPECTOMY Left 1998  . BREAST SURGERY  1998   left lumpectomy for breast ca  . COLONOSCOPY    . CORONARY ARTERY BYPASS GRAFT  2004   at [place]  . DILATION AND CURETTAGE OF UTERUS  2006  . heart bypass  2003   -single  . POLYPECTOMY    . TONSILLECTOMY    . TOTAL KNEE ARTHROPLASTY Left 11/28/2016   Procedure: LEFT TOTAL KNEE ARTHROPLASTY;  Surgeon: [name], MD;  Location: [facility];  Service: Orthopedics;  Laterality: Left;  Canal block     Current Meds  Medication Sig  . acetaminophen (TYLENOL) 500 MG tablet Take 500 mg by mouth every 6 (six) hours as needed.  Marland Kitchen acetaminophen (TYLENOL) 650 MG CR tablet Take 1,300 mg by mouth 2 (two) times daily as needed for pain.  Marland Kitchen alendronate (FOSAMAX) 70 MG tablet Take 70 mg by mouth once a week. Take with a full glass of water on an empty stomach.  Marland Kitchen atorvastatin (LIPITOR) 80 MG tablet TAKE 1 TABLET BY MOUTH EVERY DAY  . Calcium Carbonate Antacid  (TUMS PO) Take 1 tablet by mouth daily.  . fexofenadine (ALLEGRA) 180 MG tablet Take 180 mg by mouth daily as needed.   . Magnesium 250 MG TABS Take 250 mg by mouth daily as needed (LEG CRAMPS). Reported on 05/01/2015  . metoprolol succinate (TOPROL-XL) 50 MG 24 hr tablet Take 1 tablet (50 mg total) by mouth 2 (two) times daily.  . nitroGLYCERIN (NITROSTAT) 0.4 MG SL tablet Place 1 tablet (0.4 mg total) under the tongue every 5 (five) minutes as needed for chest pain. Reported on 05/01/2015  . omeprazole (PRILOSEC) 40 MG capsule Take 40 mg by mouth daily.  . potassium chloride SA (K-DUR,KLOR-CON) 20 MEQ tablet Take 20 mEq by mouth 2 (two) times daily.  . solifenacin (VESICARE) 5 MG tablet One daily as needed (Patient taking differently: Take 5 mg by mouth daily. One daily as needed)  .  triamterene-hydrochlorothiazide (MAXZIDE-25) 37.5-25 MG tablet Take 1 tablet by mouth daily.  . valACYclovir (VALTREX) 500 MG tablet Take 1 tablet (500 mg total) by mouth daily. Take one tablet by mouth twice a day for 3 days as needed for an outbreak.  Marland Kitchen ZETIA 10 MG tablet Take 10 mg by mouth daily.     Allergies:   Gadolinium and Sulfonamide derivatives   Social History   Tobacco Use  . Smoking status: Former Research scientist (life sciences)  . Smokeless tobacco: Never Used  . Tobacco comment: smoked in college   Substance Use Topics  . Alcohol use: Yes    Alcohol/week: 2.0 - 3.0 standard drinks    Types: 1 Glasses of wine, 1 - 2 Standard drinks or equivalent per week    Comment: occasionally  . Drug use: No     Family Hx: The patient's family history includes Diabetes in her brother and mother; Stroke in her father and mother. There is no history of Colon cancer, Colon polyps, Esophageal cancer, Rectal cancer, or Stomach cancer.  ROS:   Please see the history of present illness.     All other systems reviewed and are negative.   Labs/Other Tests and Data Reviewed:    Recent Labs: 04/25/2018: BUN 24; Creatinine, Ser  1.14; Potassium 4.1; Sodium 140   Recent Lipid Panel No results found for: CHOL, TRIG, HDL, CHOLHDL, LDLCALC, LDLDIRECT  Wt Readings from Last 3 Encounters:  07/03/18 174 lb (78.9 kg)  03/30/18 174 lb 6.4 oz (79.1 kg)  01/24/18 174 lb 9.6 oz (79.2 kg)     Exam:    Vital Signs:  Ht 5\' 2"  (1.575 m)   Wt 174 lb (78.9 kg)   LMP 04/11/1994   BMI 31.83 kg/m    NO BP or pulse available as pt's BP cuff was not working.   Well nourished, well developed female in no acute distress. Pulmonary: Breathing normally and able to converse in full sentences. No cough noted during exam. Neuro: alert and conversant, very appropriate.  Psych: appropriate affect.  ASSESSMENT & PLAN:    1.  Atrial flutter  -Pt has remote hx of afib. Dr. [name] checked a 30 day monitor that showed paroxysmal atrial flutter with controlled rate, 4% burden.  -Pt is currently asymptomatic.  -CHA2DS2/VAS Stroke Risk Sore Is 5 (CAD, Age (2), HTN, female). Anticoagulation is indicated for stroke risk reduction and this was discussed with the patient. She does not want to use Eliquis as she knows someone who possibly died related to Eliquis. She does agree to take Xarelto. Will start Xarelto 20 mg daily. Pt instructed to monitor for bleeding complications. Will check CBC in 2-4 weeks based on Covid 19 status. I advised her that our office is working on getting drive up lab established.   2. CAD -Hx of CABG s/p CABG 2004. On beta blocker, high intensity statin, aspirin. Will stop aspirin with initiation of Xarelto. -No anginal symptoms.   3. Hyperlipidemia -On atorvastatin 80 mg daily and Zetia. Followed by PCP. Pt asked to have labs sent to our office.   4. Hypertension -This was a phone visit and pt was unable to get her BP cuff working. She notes well controlled BP at PCP office visit in February.   COVID-19 Education: The signs and symptoms of COVID-19 were discussed with the patient and how to seek care for  testing (follow up with PCP or arrange E-visit).  The importance of social distancing was discussed today.  Patient Risk:  After full review of this patients clinical status, I feel that they are at least moderate risk at this time.  Time:   Today, I have spent 32 minutes with the patient with telehealth technology discussing atrial flutter and anticoagulation.     Medication Adjustments/Labs and Tests Ordered: Current medicines are reviewed at length with the patient today.  Concerns regarding medicines are outlined above.  Tests Ordered: Orders Placed This Encounter  Procedures  . Basic metabolic panel  . CBC   Medication Changes: Meds ordered this encounter  Medications  . aspirin EC 81 MG tablet    Sig: Take 1 tablet (81 mg total) by mouth daily.    Dispense:  90 tablet    Refill:  3    Order Specific Question:   Supervising Provider    Answer:   Constance Haw [[id number]]  . rivaroxaban (XARELTO) 20 MG TABS tablet    Sig: Take 1 tablet (20 mg total) by mouth daily with supper.    Dispense:  90 tablet    Refill:  3    Disposition:  in 3 month(s)  Signed, Daune Perch, NP  07/03/2018 3:41 PM    [facility]

## 2018-07-03 NOTE — Patient Instructions (Signed)
Medication Instructions:  Your physician has recommended you make the following change in your medication:  1.  STOP the Aspirin 2.  START Xarelto 20 mg taking 1 tablet daily   If you need a refill on your cardiac medications before your next appointment, please call your pharmacy.   Lab work: 2-4 WEEKS:  BMET & CBC PLEASE CALL us BEFORE YOU COME TO THE OFFICE SO WE CAN PUT YOU ON THE LAB SCHEDULE  If you have labs (blood work) drawn today and your tests are completely normal, you will receive your results only by: Marland Kitchen MyChart Message (if you have MyChart) OR . A paper copy in the mail If you have any lab test that is abnormal or we need to change your treatment, we will call you to review the results.  Testing/Procedures: None ordered  Follow-Up: At [facility], you and your health needs are our priority.  As part of our continuing mission to provide you with exceptional heart care, we have created designated Provider Care Teams.  These Care Teams include your primary Cardiologist (physician) and Advanced Practice Providers (APPs -  Physician Assistants and Nurse Practitioners) who all work together to provide you with the care you need, when you need it. You will need a follow up appointment in:  4 months.  Please call our office 2 months in advance to schedule this appointment.  You may see [name], MD or one of the following Advanced Practice Providers on your designated Care Team: Richardson Dopp, PA-C [place], [place] . Daune Perch, NP  Any Other Special Instructions Will Be Listed Below (If Applicable).

## 2018-07-09 ENCOUNTER — Ambulatory Visit: Payer: Medicare Other | Admitting: Cardiology

## 2018-07-16 ENCOUNTER — Institutional Professional Consult (permissible substitution): Payer: Medicare Other | Admitting: Neurology

## 2018-08-05 ENCOUNTER — Other Ambulatory Visit: Payer: Self-pay | Admitting: Cardiology

## 2018-08-06 NOTE — Telephone Encounter (Signed)
Rx refill sent to pharmacy. 

## 2018-08-27 ENCOUNTER — Institutional Professional Consult (permissible substitution): Payer: Medicare Other | Admitting: Neurology

## 2018-09-04 ENCOUNTER — Encounter: Payer: Self-pay | Admitting: Neurology

## 2018-09-04 ENCOUNTER — Ambulatory Visit (INDEPENDENT_AMBULATORY_CARE_PROVIDER_SITE_OTHER): Payer: Medicare Other | Admitting: Neurology

## 2018-09-04 ENCOUNTER — Other Ambulatory Visit: Payer: Self-pay

## 2018-09-04 VITALS — BP 162/83 | HR 87 | Temp 98.0°F | Ht 63.0 in | Wt 180.0 lb

## 2018-09-04 DIAGNOSIS — R0683 Snoring: Secondary | ICD-10-CM | POA: Diagnosis not present

## 2018-09-04 DIAGNOSIS — R351 Nocturia: Secondary | ICD-10-CM

## 2018-09-04 DIAGNOSIS — Z820 Family history of epilepsy and other diseases of the nervous system: Secondary | ICD-10-CM | POA: Diagnosis not present

## 2018-09-04 DIAGNOSIS — E669 Obesity, unspecified: Secondary | ICD-10-CM

## 2018-09-04 DIAGNOSIS — G4719 Other hypersomnia: Secondary | ICD-10-CM

## 2018-09-04 DIAGNOSIS — Z951 Presence of aortocoronary bypass graft: Secondary | ICD-10-CM | POA: Diagnosis not present

## 2018-09-04 DIAGNOSIS — G478 Other sleep disorders: Secondary | ICD-10-CM

## 2018-09-04 DIAGNOSIS — R51 Headache: Secondary | ICD-10-CM

## 2018-09-04 DIAGNOSIS — R519 Headache, unspecified: Secondary | ICD-10-CM

## 2018-09-04 NOTE — Progress Notes (Signed)
Subjective:    Patient ID: Mallory Watts is a 78 y.o. female.  HPI     Mallory Age, MD, PhD [facility] [address], [address] [place], [place] [address]  Dear Dr. Virgina Watts, I saw your patient, Mallory Watts, upon your kind request in my sleep clinic today for initial consultation of her sleep disorder, in particular, concern for underlying obstructive sleep apnea.  The patient is unaccompanied today.  As you know, Mallory Watts is a 78 year old right-handed woman with an underlying medical history of coronary artery disease with status post CABG, atrial fibrillation, arthritis with status post right total knee replacement surgery, osteopenia, hypertension, reflux disease, breast cancer with status post left lumpectomy and chemo therapy and radiation, status post tonsillectomy, status post ACDF in October 2015, and borderline obesity, who reports nonrestorative sleep and feeling tired during the day.  She reports a family history of obstructive sleep apnea in 2 brothers. I reviewed your office note from 06/05/2018, which you kindly included. Her Epworth sleepiness score is 8 out of 24, fatigue severity score is 55 out of 63.  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  She has 2 cats in the household, sometimes in her bedroom, she does not watch TV in her bedroom.  She is not sure if she snores, Mallory Watts is hard of hearing.  Bedtime is between 1030 and 11 and rise time between 630 and 730.  She has nocturia at least twice per average night and has woken up with a headache.  She denies telltale symptoms of restless leg syndrome.  She has gained weight over time.  She quit smoking in 1970, drinks alcohol occasionally in the form of wine, does not indulge in caffeine typically.   Her Past Medical History Is Significant For: Past Medical History:  Diagnosis Date  . Arthritis    . Atrial fibrillation ([place])    not symptomatic -on metoprolol BID for this   . CAD (coronary artery disease)    s/p CAGB 2004 at [facility]  . Cancer ([place]) 1998   breast cancer--left  . Dysrhythmia    atrial flutter/fib  . GERD (gastroesophageal reflux disease)   . Hypertension   . Osteopenia   . Personal history of chemotherapy 1998  . Personal history of radiation therapy 1998  . PONV (postoperative nausea and vomiting)   . Shortness of breath    exertion  . STD (sexually transmitted disease)    HSV  . Urinary incontinence     Her Past Surgical History Is Significant For: Past Surgical History:  Procedure Laterality Date  . ANTERIOR CERVICAL DECOMP/DISCECTOMY FUSION N/A 01/14/2014   Procedure: ANTERIOR CERVICAL DECOMPRESSION/DISCECTOMY FUSION CERVICAL FOUR-FIVE,CERVICAL FIVE SIX;  Surgeon: [name], MD;  Location: [facility];  Service: Neurosurgery;  Laterality: N/A;  . BREAST EXCISIONAL BIOPSY Left   . BREAST LUMPECTOMY Left 1998  . BREAST SURGERY  1998   left lumpectomy for breast ca  . COLONOSCOPY    . CORONARY ARTERY BYPASS GRAFT  2004   at [facility]  . DILATION AND CURETTAGE OF UTERUS  2006  . heart bypass  2003   -single  . POLYPECTOMY    . TONSILLECTOMY    . TOTAL KNEE ARTHROPLASTY Left 11/28/2016   Procedure: LEFT TOTAL KNEE ARTHROPLASTY;  Surgeon: [name], MD;  Location: [facility];  Service: Orthopedics;  Laterality: Left;  Canal block  Her Family History Is Significant For: Family History  Problem Relation Watts of Onset  . Diabetes Mother        AODM  . Stroke Mother   . Diabetes Brother   . Stroke Father   . Colon cancer Neg Hx   . Colon polyps Neg Hx   . Esophageal cancer Neg Hx   . Rectal cancer Neg Hx   . Stomach cancer Neg Hx     Her Social History Is Significant For: Social History   Socioeconomic History  . Marital status: Single    Spouse name: Not on file  . Number of children: Not on file  . Years of education: Not on file  .  Highest education level: Not on file  Occupational History  . Not on file  Social Needs  . Financial resource strain: Not on file  . Food insecurity:    Worry: Not on file    Inability: Not on file  . Transportation needs:    Medical: Not on file    Non-medical: Not on file  Tobacco Use  . Smoking status: Former Research scientist (life sciences)  . Smokeless tobacco: Never Used  . Tobacco comment: smoked in college   Substance and Sexual Activity  . Alcohol use: Yes    Alcohol/week: 1.0 standard drinks    Types: 1 Glasses of wine per week    Comment: occasionally  . Drug use: No  . Sexual activity: Not Currently    Partners: Male    Birth control/protection: Post-menopausal  Lifestyle  . Physical activity:    Days per week: Not on file    Minutes per session: Not on file  . Stress: Not on file  Relationships  . Social connections:    Talks on phone: Not on file    Gets together: Not on file    Attends religious service: Not on file    Active member of club or organization: Not on file    Attends meetings of clubs or organizations: Not on file    Relationship status: Not on file  Other Topics Concern  . Not on file  Social History Narrative  . Not on file    Her Allergies Are:  Allergies  Allergen Reactions  . Gadolinium Nausea Only and Other (See Comments)     Desc: PATIENT FELT DIZZY AND NAUSEOUS AFTER GAD INJECTION. NO VOMITING,NO HIVES, NO RASH., Onset Date: [id number]   . Sulfonamide Derivatives Hives  :   Her Current Medications Are:  Outpatient Encounter Medications as of 09/04/2018  Medication Sig  . acetaminophen (TYLENOL) 500 MG tablet Take 500 mg by mouth every 6 (six) hours as needed.  Marland Kitchen acetaminophen (TYLENOL) 650 MG CR tablet Take 1,300 mg by mouth 2 (two) times daily as needed for pain.  Marland Kitchen alendronate (FOSAMAX) 70 MG tablet Take 70 mg by mouth once a week. Take with a full glass of water on an empty stomach.  Marland Kitchen aspirin EC 81 MG tablet Take 1 tablet (81 mg total) by mouth  daily.  Marland Kitchen atorvastatin (LIPITOR) 80 MG tablet TAKE 1 TABLET BY MOUTH EVERY DAY  . Calcium Carbonate Antacid (TUMS PO) Take 1 tablet by mouth daily.  . fexofenadine (ALLEGRA) 180 MG tablet Take 180 mg by mouth daily as needed.   . Magnesium 250 MG TABS Take 250 mg by mouth daily as needed (LEG CRAMPS). Reported on 05/01/2015  . meclizine (ANTIVERT) 25 MG tablet Take 25 mg by mouth 3 (three) times daily as needed.  Marland Kitchen  metoprolol succinate (TOPROL-XL) 50 MG 24 hr tablet TAKE 1 TABLET BY MOUTH TWICE A DAY  . mometasone (NASONEX) 50 MCG/ACT nasal spray Place 2 sprays into the nose daily.  . nitroGLYCERIN (NITROSTAT) 0.4 MG SL tablet Place 1 tablet (0.4 mg total) under the tongue every 5 (five) minutes as needed for chest pain. Reported on 05/01/2015  . omeprazole (PRILOSEC) 40 MG capsule Take 40 mg by mouth daily.  . potassium chloride SA (K-DUR,KLOR-CON) 20 MEQ tablet Take 20 mEq by mouth 2 (two) times daily.  . solifenacin (VESICARE) 5 MG tablet One daily as needed (Patient taking differently: Take 5 mg by mouth daily. One daily as needed)  . triamterene-hydrochlorothiazide (MAXZIDE-25) 37.5-25 MG tablet Take 1 tablet by mouth daily.  . valACYclovir (VALTREX) 500 MG tablet Take 1 tablet (500 mg total) by mouth daily. Take one tablet by mouth twice a day for 3 days as needed for an outbreak.  Marland Kitchen ZETIA 10 MG tablet Take 10 mg by mouth daily.  . ondansetron (ZOFRAN) 4 MG tablet TAKE 1 TABLET TWICE DAILY AS NEEDED FOR NAUSEA AND VOMITING  . rivaroxaban (XARELTO) 20 MG TABS tablet Take 1 tablet (20 mg total) by mouth daily with supper. (Patient not taking: Reported on 09/04/2018)   No facility-administered encounter medications on file as of 09/04/2018.   :  Review of Systems:  Out of a complete 14 point review of systems, all are reviewed and negative with the exception of these symptoms as listed below: Review of Systems  Neurological:       Pt states that she is not able to get a full nights sleep. She  wakes up feeling exhausted and always tired. Pt states she has never had a sleep study. Unsure  If she snores in sleep and denies waking up gasping for air. She states that she has 2 brothers that have both been diagnosed with sleep apnea.  Epworth Sleepiness Scale  Sitting and reading: 2 Watching TV:1 Sitting inactive in a public place (ex. Theater or meeting):1 As a passenger in a car for an hour without a break:1 Lying down to rest in the afternoon:1 Sitting and talking to someone:1 Sitting quietly after lunch (no alcohol):1 In a car, while stopped in traffic:1 Total:8    Objective:  Neurological Exam  Physical Exam Physical Examination:   Vitals:   09/04/18 1334  BP: (!) 162/83  Pulse: 87  Temp: 98 F (36.7 C)    General Examination: The patient is a very pleasant 78 y.o. female in no acute distress. She appears well-developed and well-nourished and well groomed.   HEENT: Normocephalic, atraumatic, pupils are equal, round and reactive to light and accommodation. Corrective eye glasses in place. Extraocular tracking is good without limitation to gaze excursion or nystagmus noted. Normal smooth pursuit is noted. Hearing is grossly intact. Face is symmetric with normal facial animation and normal facial sensation. Speech is clear with no dysarthria noted. There is no hypophonia. There is no lip, neck/head, jaw or voice tremor. Neck is supple with full range of passive and active motion. There are no carotid bruits on auscultation. Oropharynx exam reveals: mild mouth dryness, adequate dental hygiene and mild airway crowding, due to Mallampati is class II.Nasal inspection revealed significant deviated septum to the right.   Chest: Clear to auscultation without wheezing, rhonchi or crackles noted.  Heart: S1+S2+0, regular and normal without murmurs, rubs or gallops noted.   Abdomen: Soft, non-tender and non-distended with normal bowel sounds appreciated on  auscultation.  Extremities: There is 1+ pitting edema in the distal lower extremities bilaterally. Pedal pulses are intact.  Skin: Warm and dry without trophic changes noted.  Musculoskeletal: exam reveals no obvious joint deformities, tenderness or joint swelling or erythema.   Neurologically:  Mental status: The patient is awake, alert and oriented in all 4 spheres. Her immediate and remote memory, attention, language skills and fund of knowledge are appropriate. There is no evidence of aphasia, agnosia, apraxia or anomia. Speech is clear with normal prosody and enunciation. Thought process is linear. Mood is normal and affect is normal.  Cranial nerves II - XII are as described above under HEENT exam. In addition: shoulder shrug is normal with equal shoulder height noted. Motor exam: Normal bulk, strength and tone is noted. There is no drift, or tremor. Fine motor skills and coordination: intact grossly.  Cerebellar testing: No dysmetria or intention tremor. There is no truncal or gait ataxia.  Sensory exam: intact to light touch in the upper and lower extremities.  Gait, station and balance: She stands easily. No veering to one side is noted. No leaning to one side is noted. Posture is Watts-appropriate and stance is narrow based. Gait shows normal stride length and normal pace. No problems turning are noted.   Assessment and Plan:  In summary, Mallory Watts is a very pleasant 78 y.o.-year old female with an underlying medical history of coronary artery disease with status post CABG, atrial fibrillation, arthritis with status post right total knee replacement surgery, osteopenia, hypertension, reflux disease, breast cancer with status post left lumpectomy and chemo therapy and radiation, status post tonsillectomy, status post ACDF in October 2015, and borderline obesity, whose history and physical exam concerning for obstructive sleep apnea (OSA). I had a long chat with the patient about my  findings and the diagnosis of OSA, its prognosis and treatment options. We talked about medical treatments, surgical interventions and non-pharmacological approaches. I explained in particular the risks and ramifications of untreated moderate to severe OSA, especially with respect to developing cardiovascular disease down the Road, including congestive heart failure, difficult to treat hypertension, cardiac arrhythmias, or stroke. Even type 2 diabetes has, in part, been linked to untreated OSA. Symptoms of untreated OSA include daytime sleepiness, memory problems, mood irritability and mood disorder such as depression and anxiety, lack of energy, as well as recurrent headaches, especially morning headaches. We talked about trying to maintain a healthy lifestyle in general, as well as the importance of weight control. I encouraged the patient to eat healthy, exercise daily and keep well hydrated, to keep a scheduled bedtime and wake time routine, to not skip any meals and eat healthy snacks in between meals. I advised the patient not to drive when feeling sleepy. I recommended the following at this time: sleep study.   I explained the sleep test procedure to the patient and also outlined possible surgical and non-surgical treatment options of OSA, including the use of a custom-made dental device (which would require a referral to a specialist dentist or oral surgeon), upper airway surgical options, such as pillar implants, radiofrequency surgery, tongue base surgery, and UPPP (which would involve a referral to an ENT surgeon). Rarely, jaw surgery such as mandibular advancement may be considered.  I also explained the CPAP treatment option to the patient, who indicated that she would be willing to try CPAP if the need arises. I explained the importance of being compliant with PAP treatment, not only for insurance purposes but primarily  to improve Her symptoms, and for the patient's long term health benefit,  including to reduce Her cardiovascular risks. I answered all her questions today and the patient was in agreement. I plan to see her back after the sleep study is completed and encouraged her to call with any interim questions, concerns, problems or updates.   Thank you very much for allowing me to participate in the care of this nice patient. If I can be of any further assistance to you please do not hesitate to call me at [phone].  Sincerely,   Mallory Age, MD, PhD

## 2018-09-04 NOTE — Patient Instructions (Signed)

## 2018-09-16 IMAGING — MG DIGITAL SCREENING BILATERAL MAMMOGRAM WITH TOMO AND CAD
8 series · 8 of 24 positions shown · non-contrast
Comparison: Previous exam(s).

CLINICAL DATA: Screening.

EXAM:
DIGITAL SCREENING BILATERAL MAMMOGRAM WITH TOMO AND CAD

[L MLO synth-2D]
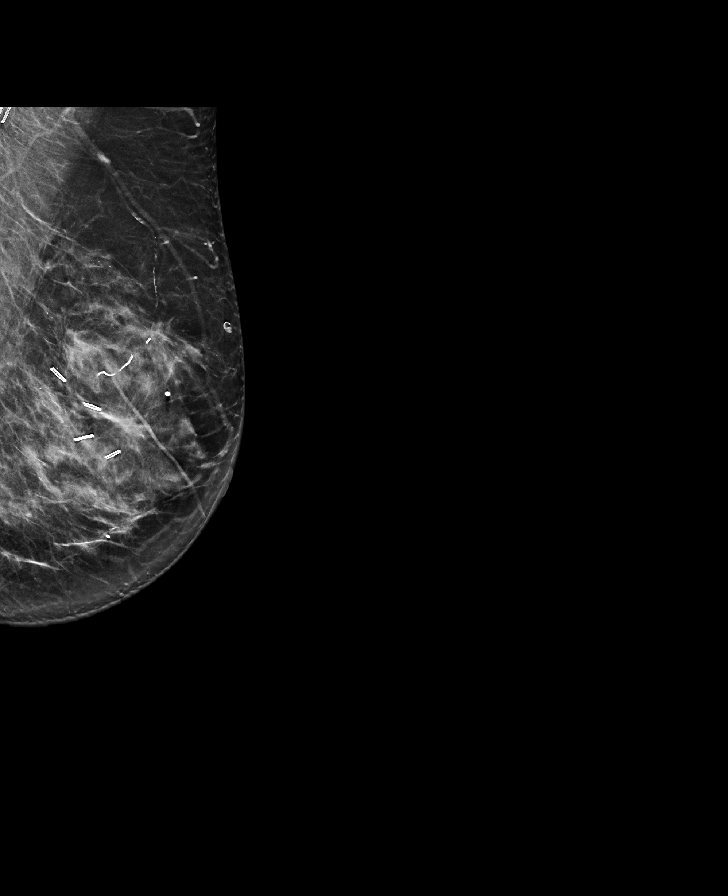

[L CC synth-2D]
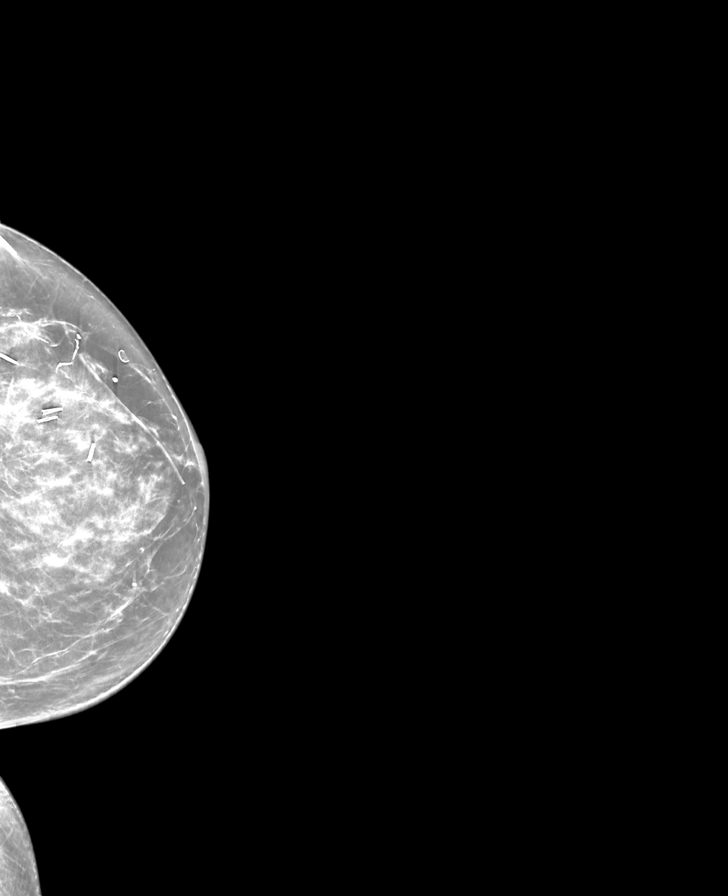

[R MLO synth-2D]
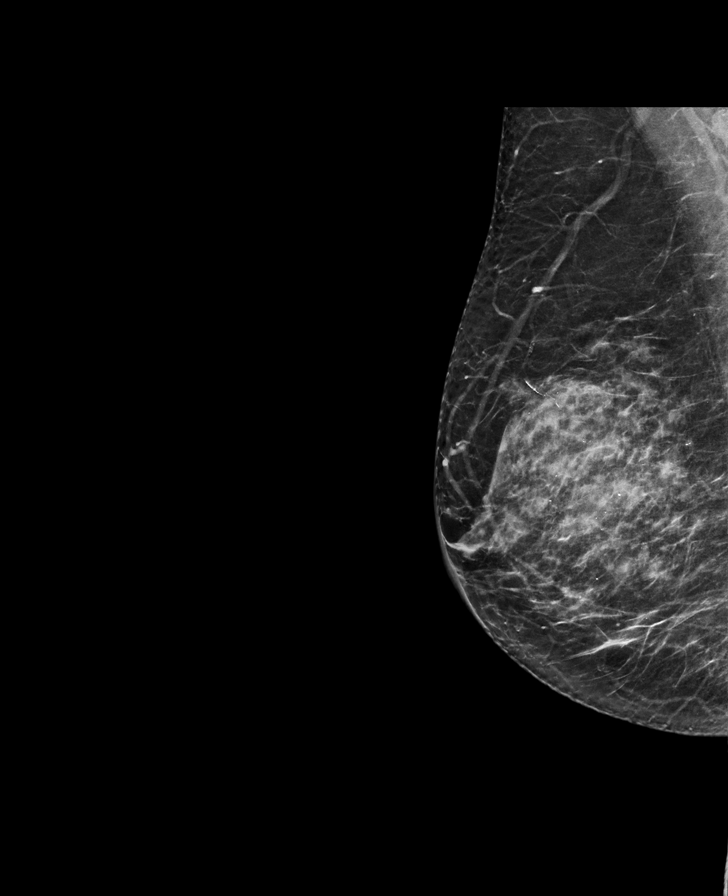

[R CC synth-2D]
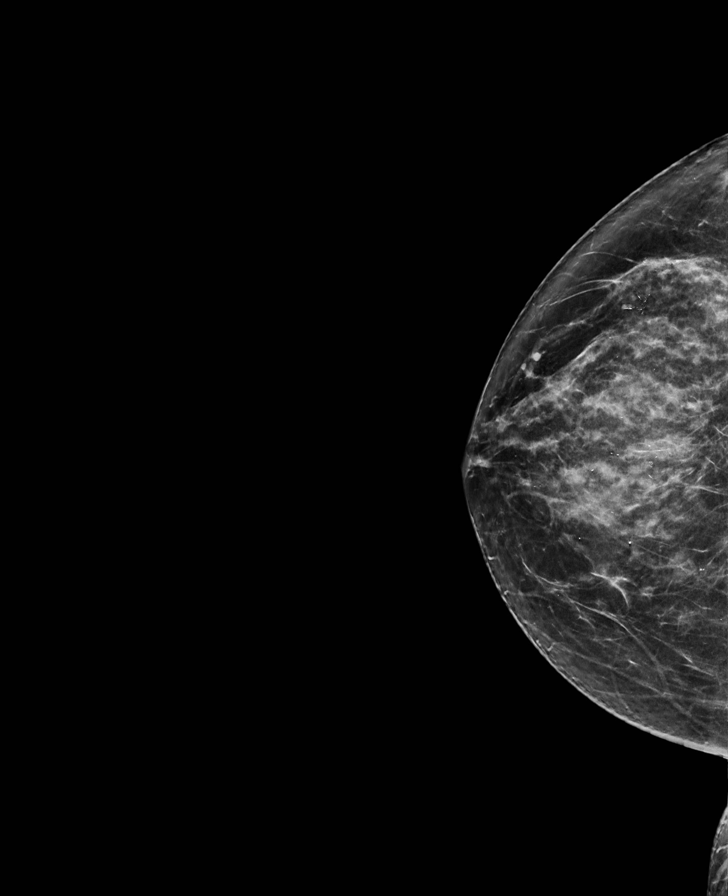

[L CC tomo · tomo slice 29/58.0]
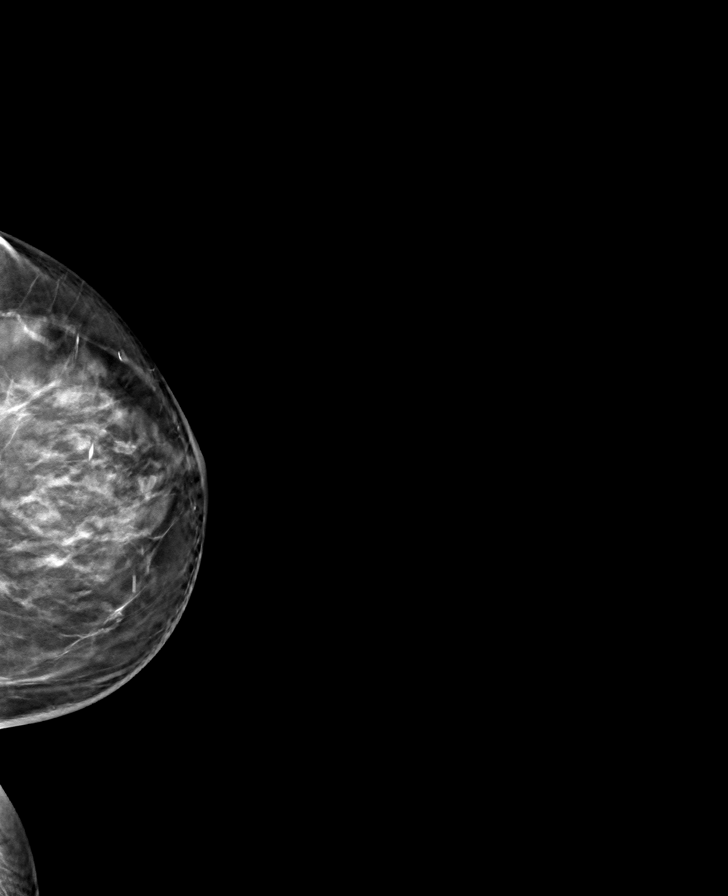

[R CC tomo · tomo slice 35/70.0]
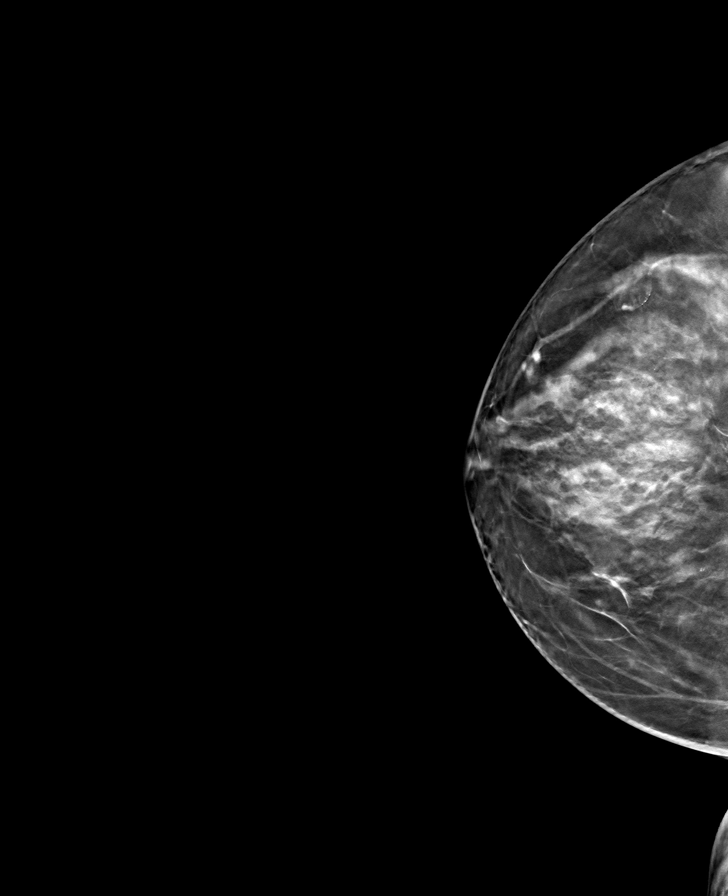

[R MLO tomo · tomo slice 35/70.0]
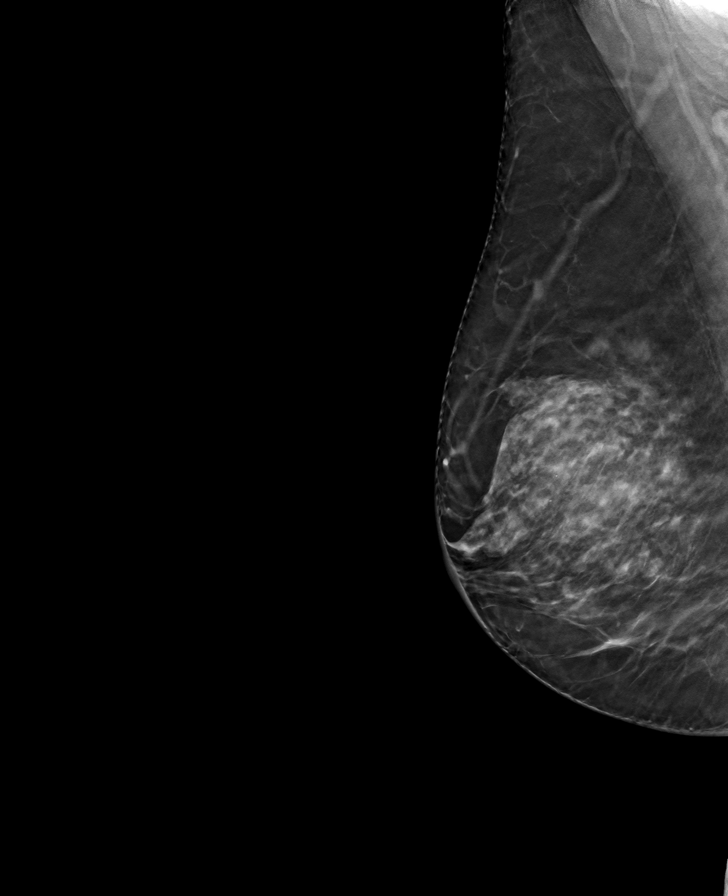

[L MLO tomo · tomo slice 35/70.0]
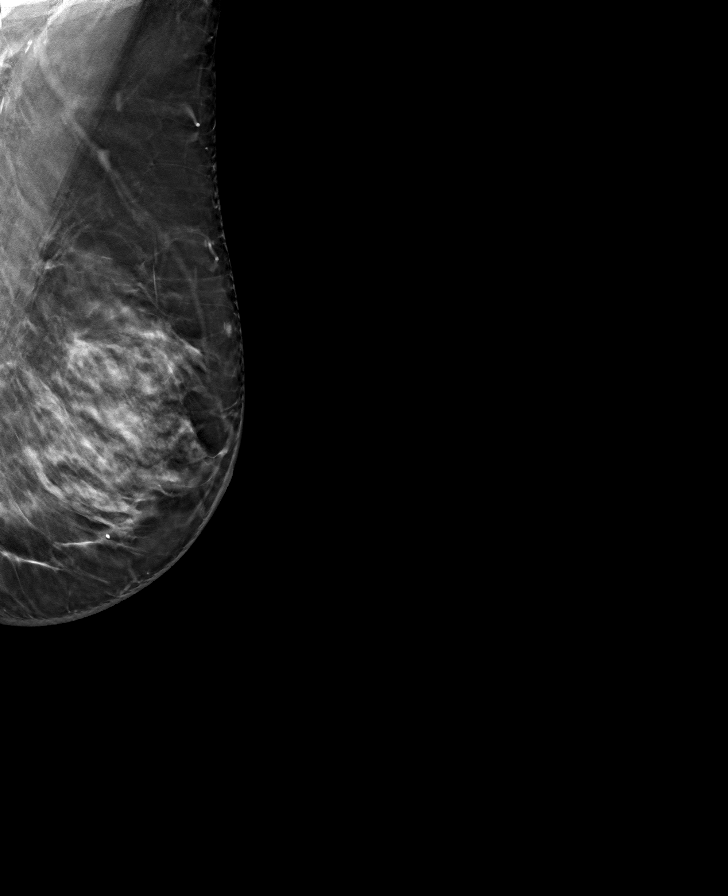

[8 of 24 positions shown; findings below may reference images not displayed]

ACR Breast Density Category c: The breast tissue is heterogeneously
dense, which may obscure small masses.
FINDINGS: There are no findings suspicious for malignancy. Images were
processed with CAD.
IMPRESSION: No mammographic evidence of malignancy. A result letter of this
screening mammogram will be mailed directly to the patient.

RECOMMENDATION:
Screening mammogram in one year. (Code:[id number])

BI-RADS CATEGORY  1: Negative.

## 2018-09-20 ENCOUNTER — Telehealth: Payer: Self-pay | Admitting: Cardiology

## 2018-09-20 DIAGNOSIS — R310 Gross hematuria: Secondary | ICD-10-CM | POA: Diagnosis not present

## 2018-09-20 DIAGNOSIS — I480 Paroxysmal atrial fibrillation: Secondary | ICD-10-CM | POA: Diagnosis not present

## 2018-09-20 DIAGNOSIS — R42 Dizziness and giddiness: Secondary | ICD-10-CM | POA: Diagnosis not present

## 2018-09-20 DIAGNOSIS — H6123 Impacted cerumen, bilateral: Secondary | ICD-10-CM | POA: Diagnosis not present

## 2018-09-20 DIAGNOSIS — N390 Urinary tract infection, site not specified: Secondary | ICD-10-CM | POA: Diagnosis not present

## 2018-09-20 NOTE — Telephone Encounter (Signed)
Patient complaining of blood in her urine. Patient stated she just started Xarelto on June 1.  Patient is currently at [facility] seeing the NP there. They are getting a urine culture, BMET, and CBC to role out UTI. Patient stated her urine is a brownish color with a foul smell. Informed patient that a message would be sent to Dr. Harrington Challenger to see if she wants her to hold her xarelto for a couple of days or wait to see what [facility] test results show. [place] our office fax number, so they could fax office note and lab results.

## 2018-09-20 NOTE — Telephone Encounter (Signed)
New message   Pt c/o medication issue:  1. Name of Medication: rivaroxaban (XARELTO) 20 MG TABS tablet  2. How are you currently taking this medication (dosage and times per day)? 1 time daily  3. Are you having a reaction (difficulty breathing--STAT)? No   4. What is your medication issue? Patient states that she has blood in urine and also has vertigo. She wants to know if this is side effects of this medication. Please call.

## 2018-09-20 NOTE — Telephone Encounter (Signed)
Keep on Xarelto for now    REview urine findings with PCP

## 2018-09-21 ENCOUNTER — Encounter: Payer: Self-pay | Admitting: Internal Medicine

## 2018-09-21 NOTE — Telephone Encounter (Signed)
Spoke with pt.  Adv to stay on Xarelto for now and follow up w/ urine with PCP. She was instructed by NP at PCP office yesterday to stop Xarelto, wait 48 hrs and if there is no further blood seen in her urine to start eliquis 5 mg BID. So she has not had yesterday's dose of Xarelto.  She has seen no further blood in urine, her specimen did not show infection.  Lab and urine results are supposed to be  being sent to Dr. Harrington Challenger.    Adv pt I would forward to Dr. Harrington Challenger and call her back. She is due to see Dr. Harrington Challenger, who she has never seen yet.  I will schedule her on 10/08/18. Pt aware I will call her back after reviewing with Dr. Harrington Challenger.

## 2018-09-26 NOTE — Telephone Encounter (Signed)
Reviewed with patient.  She had stopped Xarelto as directed by PCP and was instructed to start Eliquis 5 mg BID if no blood in urine after 48 hours.  She started Eliquis 5 mg BID on 6/15.   Will come for repeat BMET on 10/19/18   Adv that as previously discussed Dr. Harrington Challenger had recommended she not make any med changes.  She will hold onto Xarelto for now and continue Eliquis. Scheduled virtual follow up with Dr. Harrington Challenger 10/08/18.

## 2018-10-02 DIAGNOSIS — M25512 Pain in left shoulder: Secondary | ICD-10-CM | POA: Diagnosis not present

## 2018-10-05 ENCOUNTER — Telehealth: Payer: Self-pay | Admitting: Internal Medicine

## 2018-10-05 NOTE — Telephone Encounter (Signed)
New Message ° ° ° ° °CONSENT FOR TELE-HEALTH VISIT - PLEASE REVIEW TO PATIENT ° °I hereby voluntarily request, consent and authorize [facility] and its employed or contracted physicians, physician assistants, nurse practitioners or other licensed health care professionals (the Practitioner), to provide me with telemedicine health care services (the "Services") as deemed necessary by the treating Practitioner. I acknowledge and consent to receive the Services by the Practitioner via telemedicine. I understand that the telemedicine visit will involve communicating with the Practitioner through live audiovisual communication technology and the disclosure of certain medical information by electronic transmission. I acknowledge that I have been given the opportunity to request an in-person assessment or other available alternative prior to the telemedicine visit and am voluntarily participating in the telemedicine visit. ° °I understand that I have the right to withhold or withdraw my consent to the use of telemedicine in the course of my care at any time, without affecting my right to future care or treatment, and that the Practitioner or I may terminate the ° °telemedicine visit at any time. I understand that I have the right to inspect all information obtained and/or recorded in the course of the telemedicine visit and may receive copies of available information for a reasonable fee. I understand that some of the potential risks of receiving the Services via telemedicine include: ° °Delay or interruption in medical evaluation due to technological equipment failure or disruption; ° °Information transmitted may not be sufficient (e.g. poor resolution of images) to allow for appropriate medical decision making by the Practitioner; and/or ° °In rare instances, security protocols could fail, causing a breach of personal health information. ° °Furthermore, I acknowledge that it is my responsibility to provide  information about my medical history, conditions and care that is complete and accurate to the best of my ability. I acknowledge that Practitioner's advice, recommendations, and/or decision may be based on factors not within their control, such as incomplete or inaccurate data provided by me or distortions of diagnostic images or specimens that may result from electronic transmissions. I understand that the practice of medicine is not an exact science and that Practitioner makes no warranties or guarantees regarding treatment outcomes. I acknowledge that I will receive a copy of this consent concurrently upon execution via email to the email address I last provided but may also request a printed copy by calling the office of [facility]. ° °I understand that my insurance will be billed for this visit. ° °I have read or had this consent read to me. ° °I understand the contents of this consent, which adequately explains the benefits and risks of the Services being provided via telemedicine. ° °I have been provided ample opportunity to ask questions regarding this consent and the Services and have had my questions answered to my satisfaction. ° °I give my informed consent for the services to be provided through the use of telemedicine in my medical care ° °By participating in this telemedicine visit I agree to the above °YES °

## 2018-10-07 NOTE — Progress Notes (Signed)
Virtual Visit via Video Note   This visit type was conducted due to national recommendations for restrictions regarding the COVID-19 Pandemic (e.g. social distancing) in an effort to limit this patient's exposure and mitigate transmission in our community.  Due to her co-morbid illnesses, this patient is at least at moderate risk for complications without adequate follow up.  This format is felt to be most appropriate for this patient at this time.  All issues noted in this document were discussed and addressed.  A limited physical exam was performed with this format.  Please refer to the patient's chart for her consent to telehealth for [facility].   Date:  10/08/2018   ID:  Mallory Watts, DOB [birth year], MRN [id number]  Patient Location: Home Provider Location: Home  PCP:  [name], MD  Cardiologist:  [name], MD  Electrophysiologist:  None   Evaluation Performed:  Follow-Up Visit  Chief Complaint:  F/U of CAD   History of Present Illness:    Mallory Watts is a 78 y.o. female with hx of CAD   She was previously followed by Mallory Watts.  CABG in 2004.    Patient also has documented Paroxysmal atrial flutter (CHADSVASc 5)   This ws picked up on monitor in Jan (holter) Xarelto started in March 2020.       She last had a televisit with Mallory Watts in March 2020    Seen by Virgina Jock   Had hematuria   Switched to Eliquis  NO hematuria since   Breathing is OK No palpitations   No dizziness  No chest discomfort   Had shoulder injection   Thursday having a sleep study      The patient does not have symptoms concerning for COVID-19 infection (fever, chills, cough, or new shortness of breath).    Past Medical History:  Diagnosis Date  . Arthritis   . Atrial fibrillation ([place])    not symptomatic -on metoprolol BID for this   . CAD (coronary artery disease)    s/p CAGB 2004 at [facility]  . Cancer ([place]) 1998   breast cancer--left  . Dysrhythmia    atrial flutter/fib  . GERD  (gastroesophageal reflux disease)   . Hypertension   . Osteopenia   . Personal history of chemotherapy 1998  . Personal history of radiation therapy 1998  . PONV (postoperative nausea and vomiting)   . Shortness of breath    exertion  . STD (sexually transmitted disease)    HSV  . Urinary incontinence    Past Surgical History:  Procedure Laterality Date  . ANTERIOR CERVICAL DECOMP/DISCECTOMY FUSION N/A 01/14/2014   Procedure: ANTERIOR CERVICAL DECOMPRESSION/DISCECTOMY FUSION CERVICAL FOUR-FIVE,CERVICAL FIVE SIX;  Surgeon: [name], MD;  Location: [facility];  Service: Neurosurgery;  Laterality: N/A;  . BREAST EXCISIONAL BIOPSY Left   . BREAST LUMPECTOMY Left 1998  . BREAST SURGERY  1998   left lumpectomy for breast ca  . COLONOSCOPY    . CORONARY ARTERY BYPASS GRAFT  2004   at [facility]  . DILATION AND CURETTAGE OF UTERUS  2006  . heart bypass  2003   -single  . POLYPECTOMY    . TONSILLECTOMY    . TOTAL KNEE ARTHROPLASTY Left 11/28/2016   Procedure: LEFT TOTAL KNEE ARTHROPLASTY;  Surgeon: [name], MD;  Location: [facility];  Service: Orthopedics;  Laterality: Left;  Canal block     Current Meds  Medication Sig  . acetaminophen (TYLENOL) 500 MG tablet Take 500  mg by mouth every 6 (six) hours as needed.  Marland Kitchen acetaminophen (TYLENOL) 650 MG CR tablet Take 1,300 mg by mouth 2 (two) times daily as needed for pain.  Marland Kitchen alendronate (FOSAMAX) 70 MG tablet Take 70 mg by mouth once a week. Take with a full glass of water on an empty stomach.  Marland Kitchen apixaban (ELIQUIS) 5 MG TABS tablet Take 5 mg by mouth 2 (two) times daily.  Marland Kitchen atorvastatin (LIPITOR) 80 MG tablet TAKE 1 TABLET BY MOUTH EVERY DAY  . Calcium Carbonate Antacid (TUMS PO) Take 1 tablet by mouth daily.  . fexofenadine (ALLEGRA) 180 MG tablet Take 180 mg by mouth daily as needed.   . Magnesium 250 MG TABS Take 250 mg by mouth daily as needed (LEG CRAMPS). Reported on 05/01/2015  . metoprolol succinate (TOPROL-XL) 50 MG 24 hr tablet  TAKE 1 TABLET BY MOUTH TWICE A DAY  . mometasone (NASONEX) 50 MCG/ACT nasal spray Place 2 sprays into the nose daily.  . nitroGLYCERIN (NITROSTAT) 0.4 MG SL tablet Place 1 tablet (0.4 mg total) under the tongue every 5 (five) minutes as needed for chest pain. Reported on 05/01/2015  . potassium chloride SA (K-DUR,KLOR-CON) 20 MEQ tablet Take 20 mEq by mouth 2 (two) times daily.  . solifenacin (VESICARE) 5 MG tablet One daily as needed (Patient taking differently: Take 5 mg by mouth daily. One daily as needed)  . triamterene-hydrochlorothiazide (MAXZIDE-25) 37.5-25 MG tablet Take 1 tablet by mouth daily.  . valACYclovir (VALTREX) 500 MG tablet Take 1 tablet (500 mg total) by mouth daily. Take one tablet by mouth twice a day for 3 days as needed for an outbreak.  Marland Kitchen ZETIA 10 MG tablet Take 10 mg by mouth daily.     Allergies:   Gadolinium and Sulfonamide derivatives   Social History   Tobacco Use  . Smoking status: Former Research scientist (life sciences)  . Smokeless tobacco: Never Used  . Tobacco comment: smoked in college   Substance Use Topics  . Alcohol use: Yes    Alcohol/week: 1.0 standard drinks    Types: 1 Glasses of wine per week    Comment: occasionally  . Drug use: No     Family Hx: The patient's family history includes Diabetes in her brother and mother; Stroke in her father and mother. There is no history of Colon cancer, Colon polyps, Esophageal cancer, Rectal cancer, or Stomach cancer.  ROS:   Please see the history of present illness.     All other systems reviewed and are negative.   Prior CV studies:   The following studies were reviewed today:    Labs/Other Tests and Data Reviewed:    EKG:  No ECG reviewed.  Recent Labs: 04/25/2018: BUN 24; Creatinine, Ser 1.14; Potassium 4.1; Sodium 140   Recent Lipid Panel No results found for: CHOL, TRIG, HDL, CHOLHDL, LDLCALC, LDLDIRECT  Wt Readings from Last 3 Encounters:  10/08/18 175 lb (79.4 kg)  09/04/18 180 lb (81.6 kg)  07/03/18  174 lb (78.9 kg)     Objective:    Vital Signs:  BP (!) 162/93   Pulse 65   Ht 5\' 2"  (1.575 m)   Wt 175 lb (79.4 kg)   LMP 04/11/1994   BMI 32.01 kg/m    VITAL SIGNS:  reviewed   6/22  129/     6/21  166/101    ASSESSMENT & PLAN:    1. Paroxysmal atrial flutter  Pt does not sense   Was picked up on  monitor  Keep on Eliquis  2   CAD No sympotms of angina  3   HL  Will need lipids checked    4   HTN  I told her to take BP cuff to Dr Virgina Jock to see how it compares with his cuff at office   ALso, she should take log of BP measurements she gets at home   Her meds may need adjusting  5  COVID-19 Education: The signs and symptoms of COVID-19 were discussed with the patient and how to seek care for testing (follow up with PCP or arrange E-visit).  The importance of social distancing was discussed today.  Time:   Today, I have spent 20 minutes with the patient with telehealth technology discussing the above problems.     Medication Adjustments/Labs and Tests Ordered: Current medicines are reviewed at length with the patient today.  Concerns regarding medicines are outlined above.   Tests Ordered: No orders of the defined types were placed in this encounter.   Medication Changes: No orders of the defined types were placed in this encounter.   Follow Up:  F/U next January   Signed, Temeka Pore, MD  10/08/2018 11:44 AM    [facility]

## 2018-10-08 ENCOUNTER — Encounter: Payer: Self-pay | Admitting: Internal Medicine

## 2018-10-08 ENCOUNTER — Other Ambulatory Visit: Payer: Self-pay

## 2018-10-08 ENCOUNTER — Telehealth (INDEPENDENT_AMBULATORY_CARE_PROVIDER_SITE_OTHER): Payer: Medicare Other | Admitting: Internal Medicine

## 2018-10-08 VITALS — BP 162/93 | HR 65 | Ht 62.0 in | Wt 175.0 lb

## 2018-10-08 DIAGNOSIS — E785 Hyperlipidemia, unspecified: Secondary | ICD-10-CM

## 2018-10-08 DIAGNOSIS — I2510 Atherosclerotic heart disease of native coronary artery without angina pectoris: Secondary | ICD-10-CM

## 2018-10-08 DIAGNOSIS — I4892 Unspecified atrial flutter: Secondary | ICD-10-CM

## 2018-10-08 DIAGNOSIS — I10 Essential (primary) hypertension: Secondary | ICD-10-CM

## 2018-10-08 NOTE — Patient Instructions (Signed)
Medication Instructions:  No changes If you need a refill on your cardiac medications before your next appointment, please call your pharmacy.   Lab work: none If you have labs (blood work) drawn today and your tests are completely normal, you will receive your results only by: . MyChart Message (if you have MyChart) OR . A paper copy in the mail If you have any lab test that is abnormal or we need to change your treatment, we will call you to review the results.  Testing/Procedures: none  Follow-Up: At [facility], you and your health needs are our priority.  As part of our continuing mission to provide you with exceptional heart care, we have created designated Provider Care Teams.  These Care Teams include your primary Cardiologist (physician) and Advanced Practice Providers (APPs -  Physician Assistants and Nurse Practitioners) who all work together to provide you with the care you need, when you need it. You will need a follow up appointment in:  6 months.  Please call our office 2 months in advance to schedule this appointment.  You may see Paula Ross, MD or one of the following Advanced Practice Providers on your designated Care Team: Scott Weaver, PA-C Vin Bhagat, PA-C . Janine Hammond, NP  Any Other Special Instructions Will Be Listed Below (If Applicable).     

## 2018-10-11 ENCOUNTER — Other Ambulatory Visit: Payer: Self-pay

## 2018-10-11 ENCOUNTER — Ambulatory Visit (INDEPENDENT_AMBULATORY_CARE_PROVIDER_SITE_OTHER): Payer: Medicare Other | Admitting: Neurology

## 2018-10-11 DIAGNOSIS — Z820 Family history of epilepsy and other diseases of the nervous system: Secondary | ICD-10-CM

## 2018-10-11 DIAGNOSIS — G4720 Circadian rhythm sleep disorder, unspecified type: Secondary | ICD-10-CM

## 2018-10-11 DIAGNOSIS — R351 Nocturia: Secondary | ICD-10-CM

## 2018-10-11 DIAGNOSIS — G478 Other sleep disorders: Secondary | ICD-10-CM

## 2018-10-11 DIAGNOSIS — G4733 Obstructive sleep apnea (adult) (pediatric): Secondary | ICD-10-CM | POA: Diagnosis not present

## 2018-10-11 DIAGNOSIS — Z951 Presence of aortocoronary bypass graft: Secondary | ICD-10-CM

## 2018-10-11 DIAGNOSIS — R519 Headache, unspecified: Secondary | ICD-10-CM

## 2018-10-11 DIAGNOSIS — G4719 Other hypersomnia: Secondary | ICD-10-CM

## 2018-10-11 DIAGNOSIS — E669 Obesity, unspecified: Secondary | ICD-10-CM

## 2018-10-11 DIAGNOSIS — R0683 Snoring: Secondary | ICD-10-CM

## 2018-10-17 ENCOUNTER — Telehealth: Payer: Self-pay

## 2018-10-17 NOTE — Telephone Encounter (Signed)
I called pt. I advised pt that Dr. Rexene Alberts reviewed their sleep study results and found that pt has and recommends that pt be treated with a cpap. Dr. Rexene Alberts recommends that pt return for a repeat sleep study in order to properly titrate the cpap and ensure a good mask fit. Pt is agreeable to returning for a titration study. I advised pt that our sleep lab will file with pt's insurance and call pt to schedule the sleep study when we hear back from the pt's insurance regarding coverage of this sleep study. Pt verbalized understanding of results. Pt had no questions at this time but was encouraged to call back if questions arise.

## 2018-10-17 NOTE — Addendum Note (Signed)
Addended by: Star Age on: 10/17/2018 08:28 AM   Modules accepted: Orders

## 2018-10-17 NOTE — Telephone Encounter (Signed)
I called pt to discuss her sleep study results. Pt says that she is busy right now and will call me back.

## 2018-10-17 NOTE — Procedures (Signed)
PATIENT'S NAME:  Mallory Watts, Mallory Watts DOB:      1941/01/11      MR#:    [id number]     DATE OF RECORDING: 10/11/2018 REFERRING M.D.:  [name] MD Study Performed:   Baseline Polysomnogram HISTORY: 78 year old woman with a history of coronary artery disease with status post CABG, atrial fibrillation, arthritis with status post right total knee replacement surgery, osteopenia, hypertension, reflux disease, breast cancer with status post left lumpectomy and chemo therapy and radiation, status post tonsillectomy, status post ACDF in October 2015, and borderline obesity, who reports nonrestorative sleep and feeling tired during the day. The patient endorsed the Epworth Sleepiness Scale at 8 points. The patient's weight 180 pounds with a height of 63 (inches), resulting in a BMI of 32. kg/m2.   CURRENT MEDICATIONS: Tylenol, Fosamax, ASA 81mg , Lipitor, Tums, Allegra, Magnesium, Antivert, Toprol-XL, Nasonex, Nitrostat, Prilosec, K-Dur, Vesicare, Maxide, Valtrex, Zetia, Zofran, Xarelto   PROCEDURE:  This is a multichannel digital polysomnogram utilizing the Somnostar 11.2 system.  Electrodes and sensors were applied and monitored per AASM Specifications.   EEG, EOG, Chin and Limb EMG, were sampled at 200 Hz.  ECG, Snore and Nasal Pressure, Thermal Airflow, Respiratory Effort, CPAP Flow and Pressure, Oximetry was sampled at 50 Hz. Digital video and audio were recorded.      BASELINE STUDY:  Lights Out was at 21:29 and Lights On at 04:29.  Total recording time (TRT) was 420.5 minutes, with a total sleep time (TST) of 369 minutes. The patient's sleep latency to persistent sleep was 37.5 minutes, which is delayed. REM latency was 93.5 minutes, which is normal. The sleep efficiency was 87.8%.     SLEEP ARCHITECTURE: WASO (Wake after sleep onset) was 41 minutes with mild to moderate sleep fragmentation noted. There were 89 minutes in Stage N1, 225 minutes Stage N2, 26.5 minutes Stage N3 and 28.5 minutes in Stage REM.   The percentage of Stage N1 was 24.1%, which is increased, Stage N2 was 61.%, which is mildly increased, Stage N3 was 7.2% and Stage R (REM sleep) was 7.7%, which is reduced.  RESPIRATORY ANALYSIS:  There were a total of 361 respiratory events:  330 obstructive apneas, 0 central apneas and 0 mixed apneas with a total of 330 apneas and an apnea index (AI) of 53.7 /hour. There were 31 hypopneas with a hypopnea index of 5. /hour. The patient also had 0 respiratory event related arousals (RERAs).      The total APNEA/HYPOPNEA INDEX (AHI) was 58.7 /hour and the total RESPIRATORY DISTURBANCE INDEX was 0. 58.7 /hour.  24 events occurred in REM sleep and 52 events in NREM. The REM AHI was 50.5 /hour, versus a non-REM AHI of 59.4. The patient spent 61 minutes of total sleep time in the supine position and 308 minutes in non-supine.. The supine AHI was 57.0 versus a non-supine AHI of 59.1.  OXYGEN SATURATION & C02:  The Wake baseline 02 saturation was 94%, with the lowest being 80%. Time spent below 89% saturation equaled 161 minutes.  PERIODIC LIMB MOVEMENTS: The patient had a total of 0 Periodic Limb Movements.  The Periodic Limb Movement (PLM) index was 0 and the PLM Arousal index was 0/hour. The arousals were noted as: 6 were spontaneous, 0 were associated with PLMs, 292 were associated with respiratory events.  Audio and video analysis did not show any abnormal or unusual movements, behaviors, phonations or vocalizations. The patient took 2 bathroom breaks. Mild to moderate snoring was noted. The EKG was  in keeping with normal sinus rhythm (NSR) with rare PVCs noted.  Post-study, the patient indicated that sleep was worse than usual.   IMPRESSION:  1. Obstructive Sleep Apnea (OSA) 2. Dysfunctions associated with sleep stages or arousal from sleep  RECOMMENDATIONS:  1. This study demonstrates severe obstructive sleep apnea, with a total AHI of 58.7/hour, REM AHI of 50.5/hour, supine AHI of 57/hour and  O2 nadir of 80%. Treatment with positive airway pressure in the form of CPAP is recommended. This will require a full night titration study to optimize therapy. Other treatment options may include (generally speaking) avoidance of supine sleep position along with weight loss, upper airway or jaw surgery in selected patients or the use of an oral appliance in certain patients. ENT evaluation and/or consultation with a maxillofacial surgeon or dentist may be feasible in some instances.    2. Please note that untreated obstructive sleep apnea may carry additional perioperative morbidity. Patients with significant obstructive sleep apnea should receive perioperative PAP therapy and the surgeons and particularly the anesthesiologist should be informed of the diagnosis and the severity of the sleep disordered breathing. 3. This study shows sleep fragmentation and abnormal sleep stage percentages; these are nonspecific findings and per se do not signify an intrinsic sleep disorder or a cause for the patient's sleep-related symptoms. Causes include (but are not limited to) the first night effect of the sleep study, circadian rhythm disturbances, medication effect or an underlying mood disorder or medical problem.  4. The patient should be cautioned not to drive, work at heights, or operate dangerous or heavy equipment when tired or sleepy. Review and reiteration of good sleep hygiene measures should be pursued with any patient. 5. The patient will be seen in follow-up in the sleep clinic at [facility] for discussion of the test results, symptom and treatment compliance review, further management strategies, etc. The referring provider will be notified of the test results.  I certify that I have reviewed the entire raw data recording prior to the issuance of this report in accordance with the Standards of Accreditation of the American Academy of Sleep Medicine (AASM)  Star Age, MD, PhD Diplomat, American Board of  Neurology and Sleep Medicine (Neurology and Sleep Medicine)

## 2018-10-17 NOTE — Progress Notes (Signed)
Patient referred by Dr. Virgina Jock, seen by me on 09/04/18, diagnostic PSG on 10/11/18.   Please call and notify the patient that the recent sleep study showed severe obstructive sleep apnea. I recommend treatment for this in the form of CPAP. This will require a repeat sleep study for proper titration and mask fitting and correct monitoring of the oxygen saturations. Please explain to patient. I have placed an order in the chart. Thanks.  Star Age, MD, PhD [facility])

## 2018-10-17 NOTE — Telephone Encounter (Signed)
Pt returned call. Please call back when available. 

## 2018-10-17 NOTE — Telephone Encounter (Signed)
-----   Message from Star Age, MD sent at 10/17/2018  8:28 AM EDT ----- Patient referred by Dr. Virgina Jock, seen by me on 09/04/18, diagnostic PSG on 10/11/18.   Please call and notify the patient that the recent sleep study showed severe obstructive sleep apnea. I recommend treatment for this in the form of CPAP. This will require a repeat sleep study for proper titration and mask fitting and correct monitoring of the oxygen saturations. Please explain to patient. I have placed an order in the chart. Thanks.  Star Age, MD, PhD [facility])

## 2018-10-19 ENCOUNTER — Other Ambulatory Visit: Payer: Self-pay

## 2018-10-19 ENCOUNTER — Other Ambulatory Visit: Payer: Medicare Other | Admitting: *Deleted

## 2018-10-19 ENCOUNTER — Telehealth: Payer: Self-pay

## 2018-10-19 DIAGNOSIS — I10 Essential (primary) hypertension: Secondary | ICD-10-CM

## 2018-10-19 DIAGNOSIS — Z79899 Other long term (current) drug therapy: Secondary | ICD-10-CM

## 2018-10-19 LAB — CBC
Hematocrit: 44.1 % (ref 34.0–46.6)
Hemoglobin: 14.5 g/dL (ref 11.1–15.9)
MCH: 29 pg (ref 26.6–33.0)
MCHC: 32.9 g/dL (ref 31.5–35.7)
MCV: 88 fL (ref 79–97)
Platelets: 240 10*3/uL (ref 150–450)
RBC: 5 x10E6/uL (ref 3.77–5.28)
RDW: 14.3 % (ref 11.7–15.4)
WBC: 8.6 10*3/uL (ref 3.4–10.8)

## 2018-10-19 LAB — BASIC METABOLIC PANEL
BUN/Creatinine Ratio: 20 (ref 12–28)
BUN: 26 mg/dL (ref 8–27)
CO2: 24 mmol/L (ref 20–29)
Calcium: 9.2 mg/dL (ref 8.7–10.3)
Chloride: 102 mmol/L (ref 96–106)
Creatinine, Ser: 1.27 mg/dL — ABNORMAL HIGH (ref 0.57–1.00)
GFR calc Af Amer: 48 mL/min/{1.73_m2} — ABNORMAL LOW (ref >59)
GFR calc non Af Amer: 41 mL/min/{1.73_m2} — ABNORMAL LOW (ref >59)
Glucose: 135 mg/dL — ABNORMAL HIGH (ref 65–99)
Potassium: 3.9 mmol/L (ref 3.5–5.2)
Sodium: 140 mmol/L (ref 134–144)

## 2018-10-19 NOTE — Telephone Encounter (Signed)
-----   Message from Daune Perch, NP sent at 10/19/2018  4:47 PM EDT ----- Metabolic panel shows mild reduction in kidney function. Make sure you pt is getting enough fluids to drink. Would repeat BMet in 1 month. CBC is still pending.   Daune Perch, NP

## 2018-10-19 NOTE — Telephone Encounter (Signed)
Reviewed results with patient who verbalized understanding. Per Pecolia Ades to have pt repeat BMET in 1 month. Order has been placed in epic.

## 2018-10-26 ENCOUNTER — Other Ambulatory Visit (HOSPITAL_COMMUNITY)
Admission: RE | Admit: 2018-10-26 | Discharge: 2018-10-26 | Disposition: A | Discharge status: Home / Self Care | Payer: Medicare Other | Source: Ambulatory Visit | Attending: Neurology | Admitting: Neurology

## 2018-10-26 DIAGNOSIS — Z1159 Encounter for screening for other viral diseases: Secondary | ICD-10-CM | POA: Insufficient documentation

## 2018-10-26 LAB — SARS CORONAVIRUS 2 (TAT 6-24 HRS): SARS Coronavirus 2: NEGATIVE

## 2018-10-30 ENCOUNTER — Ambulatory Visit (INDEPENDENT_AMBULATORY_CARE_PROVIDER_SITE_OTHER): Payer: Medicare Other | Admitting: Neurology

## 2018-10-30 DIAGNOSIS — Z820 Family history of epilepsy and other diseases of the nervous system: Secondary | ICD-10-CM

## 2018-10-30 DIAGNOSIS — G4720 Circadian rhythm sleep disorder, unspecified type: Secondary | ICD-10-CM

## 2018-10-30 DIAGNOSIS — G4733 Obstructive sleep apnea (adult) (pediatric): Secondary | ICD-10-CM | POA: Diagnosis not present

## 2018-10-30 DIAGNOSIS — G478 Other sleep disorders: Secondary | ICD-10-CM

## 2018-10-30 DIAGNOSIS — R519 Headache, unspecified: Secondary | ICD-10-CM

## 2018-10-30 DIAGNOSIS — G4739 Other sleep apnea: Secondary | ICD-10-CM

## 2018-10-30 DIAGNOSIS — E669 Obesity, unspecified: Secondary | ICD-10-CM

## 2018-10-30 DIAGNOSIS — G4719 Other hypersomnia: Secondary | ICD-10-CM

## 2018-10-30 DIAGNOSIS — Z951 Presence of aortocoronary bypass graft: Secondary | ICD-10-CM

## 2018-10-30 DIAGNOSIS — R351 Nocturia: Secondary | ICD-10-CM

## 2018-11-01 ENCOUNTER — Other Ambulatory Visit: Payer: Self-pay | Admitting: Cardiology

## 2018-11-05 ENCOUNTER — Telehealth: Payer: Self-pay

## 2018-11-05 DIAGNOSIS — H04123 Dry eye syndrome of bilateral lacrimal glands: Secondary | ICD-10-CM | POA: Diagnosis not present

## 2018-11-05 DIAGNOSIS — H35371 Puckering of macula, right eye: Secondary | ICD-10-CM | POA: Diagnosis not present

## 2018-11-05 DIAGNOSIS — Z961 Presence of intraocular lens: Secondary | ICD-10-CM | POA: Diagnosis not present

## 2018-11-05 DIAGNOSIS — H26491 Other secondary cataract, right eye: Secondary | ICD-10-CM | POA: Diagnosis not present

## 2018-11-05 NOTE — Procedures (Signed)
PATIENT'S NAME:  Mallory Watts, Mallory Watts DOB:      1941-03-21      MR#:    [id number]     DATE OF RECORDING: 10/30/2018 REFERRING M.D.:  [name] MD Study Performed:   CPAP  Titration HISTORY: 78 year old woman with a history of CAD (s/p CABG), A fib, arthritis (s/p right TKA), osteopenia, hypertension, reflux disease, breast cancer with status post left lumpectomy and chemo therapy and radiation, status post tonsillectomy, status post ACDF in October 2015, and borderline obesity, who presents for a PAP titration study. Her baseline sleep study from 10/11/18 showed severe obstructive sleep apnea, with a total AHI of 58.7/hour, REM AHI of 50.5/hour, supine AHI of 57/hour and O2 nadir of 80%. BMI of 32. kg/m2.   CURRENT MEDICATIONS: Tylenol, Fosamax, ASA 81mg , Lipitor, Tums, Allegra, Magnesium, Antivert, Toprol-XL, Nasonex, Nitrostat, Prilosec, K-Dur, Vesicare, Maxide, Valtrex, Zetia, Zofran, Xarelto  PROCEDURE:  This is a multichannel digital polysomnogram utilizing the SomnoStar 11.2 system.  Electrodes and sensors were applied and monitored per AASM Specifications.   EEG, EOG, Chin and Limb EMG, were sampled at 200 Hz.  ECG, Snore and Nasal Pressure, Thermal Airflow, Respiratory Effort, CPAP Flow and Pressure, Oximetry was sampled at 50 Hz. Digital video and audio were recorded.      The patient was fitted with a full face mask, due to mouth venting and preferred the ResMed F30i, medium over the [employer]. CPAP was initiated at [place] with heated humidity per AASM standards and pressure was advanced to 13 cmH20 because of hypopneas, apneas and desaturations. She started exhibiting central apneas, particularly at pressures higher than 9 cm and was switched to standard BiPAP at 14/10 cm  and further titrated to 19/15 cm. At a BIPAP pressure of 19/15 cmH20, there was a reduction of the AHI to 10.7/hour with non-supine REM sleep achieved and O2 nadir of 94%. At a CPAP pressure of 13 cm, her AHI was 5.5/hour,  with non-supine REM sleep achieved and O2 nadir of 92%.   Lights Out was at 20:58 and Lights On at 05:11. Total recording time (TRT) was 493 minutes, with a total sleep time (TST) of 410 minutes. The patient's sleep latency was 20.5 minutes. REM latency was 171 minutes, which is delayed. The sleep efficiency was 83.2 %.    SLEEP ARCHITECTURE: WASO (Wake after sleep onset) was 79.5 minutes with mild to moderate sleep fragmentation noted. There were 15 minutes in Stage N1, 182 minutes Stage N2, 95 minutes Stage N3 and 118 minutes in Stage REM.  The percentage of Stage N1 was 3.7%, Stage N2 was 44.4%, Stage N3 was 23.2% and Stage R (REM sleep) was 28.8%, which is increased. The arousals were noted as: 107 were spontaneous, 13 were associated with PLMs, 72 were associated with respiratory events.  RESPIRATORY ANALYSIS:  There was a total of 265 respiratory events: 78 obstructive apneas, 144 central apneas and 0 mixed apneas with a total of 222 apneas and an apnea index (AI) of 32.5 /hour. There were 43 hypopneas with a hypopnea index of 6.3/hour. The patient also had 0 respiratory event related arousals (RERAs).      The total APNEA/HYPOPNEA INDEX  (AHI) was 38.8 /hour and the total RESPIRATORY DISTURBANCE INDEX was 38.8 /hour  18 events occurred in REM sleep and 247 events in NREM. The REM AHI was 9.2 /hour versus a non-REM AHI of 50.8 /hour.  The patient spent 96.5 minutes of total sleep time in the supine position and 314 minutes  in non-supine. The supine AHI was 64.1, versus a non-supine AHI of 31.0.  OXYGEN SATURATION & C02:  The baseline 02 saturation was 95%, with the lowest being 89%. Time spent below 89% saturation equaled 0 minutes.  PERIODIC LIMB MOVEMENTS:  The patient had a total of 40 Periodic Limb Movements. The Periodic Limb Movement (PLM) index was 5.9 and the PLM Arousal index was 1.9 /hour.  Audio and video analysis did not show any abnormal or unusual movements, behaviors, phonations or  vocalizations. The patient took 2 bathroom breaks. The EKG was in keeping with normal sinus rhythm. Post-study, the patient indicated that sleep was better than usual.  IMPRESSION: 1. Obstructive Sleep Apnea (OSA) 2. Dysfunctions associated with sleep stages or arousal from sleep 3. Treatment emergent Central Sleep Apnea   RECOMMENDATIONS: 1. This study demonstrates significant improvement of the patient's obstructive sleep apnea with CPAP therapy, also with standard BiPAP, due to central apneas noted. I will recommend treatment at home with CPAP of 13 cm for now, via medium full face mask of choice, with heated humidity. The patient should be reminded to be fully compliant with PAP therapy to improve sleep related symptoms and decrease long term cardiovascular risks. The patient should be reminded, that it may take up to 3 months to get fully used to using PAP with all planned sleep. The earlier full compliance is achieved, the better long term compliance tends to be. Please note that untreated obstructive sleep apnea may carr additional perioperative morbidity. Patients with significant obstructive sleep apnea should receive perioperative PAP therapy and the surgeons and particularly the anesthesiologist should be informed of the diagnosis and the severity of the sleep disordered breathing. 2. This study shows sleep fragmentation and abnormal sleep stage percentages; these are nonspecific findings and per se do not signify an intrinsic sleep disorder or a cause for the patient's sleep-related symptoms. Causes include (but are not limited to) the first night effect of the sleep study, circadian rhythm disturbances, medication effect or an underlying mood disorder or medical problem.  3. The patient should be cautioned not to drive, work at heights, or operate dangerous or heavy equipment when tired or sleepy. Review and reiteration of good sleep hygiene measures should be pursued with any  patient. 4. The patient will be seen in follow-up in the sleep clinic at [facility] for discussion of the test results, symptom and treatment compliance review, further management strategies, etc. The referring provider will be notified of the test results.   I certify that I have reviewed the entire raw data recording prior to the issuance of this report in accordance with the Standards of Accreditation of the American Academy of Sleep Medicine (AASM)   Star Age, MD, PhD Diplomat, American Board of Neurology and Sleep Medicine (Neurology and Sleep Medicine)

## 2018-11-05 NOTE — Addendum Note (Signed)
Addended by: Star Age on: 11/05/2018 08:01 AM   Modules accepted: Orders

## 2018-11-05 NOTE — Progress Notes (Signed)
Patient referred by Dr. Virgina Jock, seen by me on 09/04/18, diagnostic PSG on 10/11/18. Patient had a CPAP titration study on 10/30/18.  Please call and inform patient that I have entered an order for treatment with positive airway pressure (PAP) treatment for obstructive sleep apnea (OSA). She did well during the latest sleep study with CPAP and also with higher BiPAP therapy. For now, I would recommend we start CPAP at 13 cm. We will, therefore, arrange for a machine for home use through a DME (durable medical equipment) company of Her choice; and I will see the patient back in follow-up in about 10 weeks. Please also explain to the patient that I will be looking out for compliance data, which can be downloaded from the machine (stored on an SD card, that is inserted in the machine) or via remote access through a modem, that is built into the machine. At the time of the followup appointment we will discuss sleep study results and how it is going with PAP treatment at home. Please advise patient to bring Her machine at the time of the first FU visit, even though this is cumbersome. Bringing the machine for every visit after that will likely not be needed, but often helps for the first visit to troubleshoot if needed. Please re-enforce the importance of compliance with treatment and the need for [place] to monitor compliance data - often an insurance requirement and actually good feedback for the patient as far as how they are doing.  Also remind patient, that any interim PAP machine or mask issues should be first addressed with the DME company, as they can often help better with technical and mask fit issues. Please ask if patient has a preference regarding DME company.  Please also make sure, the patient has a follow-up appointment with me in about 10 weeks from the setup date, thanks. May see one of our nurse practitioners if needed for proper timing of the FU appointment.  Please fax or rout report to the referring  provider. Thanks,   Star Age, MD, PhD [facility])

## 2018-11-05 NOTE — Telephone Encounter (Signed)
-----   Message from Star Age, MD sent at 11/05/2018  8:01 AM EDT ----- Patient referred by Dr. Virgina Jock, seen by me on 09/04/18, diagnostic PSG on 10/11/18. Patient had a CPAP titration study on 10/30/18.  Please call and inform patient that I have entered an order for treatment with positive airway pressure (PAP) treatment for obstructive sleep apnea (OSA). She did well during the latest sleep study with CPAP and also with higher BiPAP therapy. For now, I would recommend we start CPAP at 13 cm. We will, therefore, arrange for a machine for home use through a DME (durable medical equipment) company of Her choice; and I will see the patient back in follow-up in about 10 weeks. Please also explain to the patient that I will be looking out for compliance data, which can be downloaded from the machine (stored on an SD card, that is inserted in the machine) or via remote access through a modem, that is built into the machine. At the time of the followup appointment we will discuss sleep study results and how it is going with PAP treatment at home. Please advise patient to bring Her machine at the time of the first FU visit, even though this is cumbersome. Bringing the machine for every visit after that will likely not be needed, but often helps for the first visit to troubleshoot if needed. Please re-enforce the importance of compliance with treatment and the need for [place] to monitor compliance data - often an insurance requirement and actually good feedback for the patient as far as how they are doing.  Also remind patient, that any interim PAP machine or mask issues should be first addressed with the DME company, as they can often help better with technical and mask fit issues. Please ask if patient has a preference regarding DME company.  Please also make sure, the patient has a follow-up appointment with me in about 10 weeks from the setup date, thanks. May see one of our nurse practitioners if needed for proper timing  of the FU appointment.  Please fax or rout report to the referring provider. Thanks,   Star Age, MD, PhD [facility])

## 2018-11-05 NOTE — Telephone Encounter (Signed)
I called pt. I advised pt that Dr. Rexene Alberts reviewed their sleep study results and found that pt did well with the cpap during her sleep study. Dr. Rexene Alberts recommends that pt start a cpap at home. I reviewed PAP compliance expectations with the pt. Pt is agreeable to starting a CPAP. I advised pt that an order will be sent to a DME, [facility], and [facility] will call the pt within about one week after they file with the pt's insurance. [facility] will show the pt how to use the machine, fit for masks, and troubleshoot the CPAP if needed. A follow up appt was made for insurance purposes with Dr. Rexene Alberts on 01/22/2019 at 11:30am. Pt verbalized understanding to arrive 15 minutes early and bring their CPAP. A letter with all of this information in it will be mailed to the pt as a reminder. I verified with the pt that the address we have on file is correct. Pt verbalized understanding of results. Pt had no questions at this time but was encouraged to call back if questions arise. I have sent the order to [facility] and have received confirmation that they have received the order.

## 2018-11-20 ENCOUNTER — Telehealth: Payer: Self-pay

## 2018-11-20 ENCOUNTER — Telehealth: Payer: Self-pay | Admitting: Neurology

## 2018-11-20 DIAGNOSIS — G4719 Other hypersomnia: Secondary | ICD-10-CM

## 2018-11-20 NOTE — Telephone Encounter (Signed)
Spoke with patient about her concerns with CPAP feeling like she could not let air out. Her download showed a leak of 47 and centrals over 50%. I explained that we were going to send an order to decrease cpap pressure from 13 to 11. We will repeat a download to see if centrals have decreased. This will help some. I asked her to go by [facility] in the morning and get a new mask.I explained the importance of getting the leak down so she can adjust better to cpap. She understood what we are doing  and will let me know if she has any trouble.

## 2018-11-20 NOTE — Addendum Note (Signed)
Addended by: Verlin Grills T on: 11/20/2018 03:32 PM   Modules accepted: Orders

## 2018-11-20 NOTE — Telephone Encounter (Signed)
Pt is asking for a call to discuss her CPAP pushing out too much air.  Pt states she was only able to use the CPAP for about an hour last night.  Please call

## 2018-11-20 NOTE — Telephone Encounter (Signed)
Will discuss with Shirlean Mylar on how to proceed for this patient.

## 2018-11-20 NOTE — Telephone Encounter (Signed)
Noted  

## 2018-11-20 NOTE — Telephone Encounter (Signed)
Per Robin~  can you send an order to decrease her pressure to 11 and then repeat a download. If it does not get better then we will need to do a repeat study. I can call the patient and let her know.  I have submitted the order for the DME via epic.

## 2018-11-28 ENCOUNTER — Encounter: Payer: Self-pay | Admitting: Cardiology

## 2018-11-28 DIAGNOSIS — R739 Hyperglycemia, unspecified: Secondary | ICD-10-CM | POA: Diagnosis not present

## 2018-11-28 DIAGNOSIS — E7849 Other hyperlipidemia: Secondary | ICD-10-CM | POA: Diagnosis not present

## 2018-11-28 DIAGNOSIS — Z23 Encounter for immunization: Secondary | ICD-10-CM | POA: Diagnosis not present

## 2018-11-28 DIAGNOSIS — M859 Disorder of bone density and structure, unspecified: Secondary | ICD-10-CM | POA: Diagnosis not present

## 2018-12-04 DIAGNOSIS — I10 Essential (primary) hypertension: Secondary | ICD-10-CM | POA: Diagnosis not present

## 2018-12-04 DIAGNOSIS — R82998 Other abnormal findings in urine: Secondary | ICD-10-CM | POA: Diagnosis not present

## 2018-12-05 LAB — IFOBT (OCCULT BLOOD): IFOBT: NEGATIVE

## 2018-12-06 ENCOUNTER — Other Ambulatory Visit: Payer: Self-pay | Admitting: Internal Medicine

## 2018-12-06 DIAGNOSIS — H04129 Dry eye syndrome of unspecified lacrimal gland: Secondary | ICD-10-CM | POA: Diagnosis not present

## 2018-12-06 DIAGNOSIS — R42 Dizziness and giddiness: Secondary | ICD-10-CM | POA: Diagnosis not present

## 2018-12-06 DIAGNOSIS — N183 Chronic kidney disease, stage 3 (moderate): Secondary | ICD-10-CM | POA: Diagnosis not present

## 2018-12-06 DIAGNOSIS — I2510 Atherosclerotic heart disease of native coronary artery without angina pectoris: Secondary | ICD-10-CM | POA: Diagnosis not present

## 2018-12-06 DIAGNOSIS — Z0000 Encounter for general adult medical examination without abnormal findings: Secondary | ICD-10-CM | POA: Diagnosis not present

## 2018-12-06 DIAGNOSIS — R739 Hyperglycemia, unspecified: Secondary | ICD-10-CM | POA: Diagnosis not present

## 2018-12-06 DIAGNOSIS — Z7901 Long term (current) use of anticoagulants: Secondary | ICD-10-CM | POA: Diagnosis not present

## 2018-12-06 DIAGNOSIS — G4733 Obstructive sleep apnea (adult) (pediatric): Secondary | ICD-10-CM | POA: Diagnosis not present

## 2018-12-06 DIAGNOSIS — E669 Obesity, unspecified: Secondary | ICD-10-CM | POA: Diagnosis not present

## 2018-12-06 DIAGNOSIS — I129 Hypertensive chronic kidney disease with stage 1 through stage 4 chronic kidney disease, or unspecified chronic kidney disease: Secondary | ICD-10-CM | POA: Diagnosis not present

## 2018-12-06 DIAGNOSIS — N952 Postmenopausal atrophic vaginitis: Secondary | ICD-10-CM | POA: Diagnosis not present

## 2018-12-06 DIAGNOSIS — I480 Paroxysmal atrial fibrillation: Secondary | ICD-10-CM | POA: Diagnosis not present

## 2018-12-06 DIAGNOSIS — Z1231 Encounter for screening mammogram for malignant neoplasm of breast: Secondary | ICD-10-CM

## 2018-12-18 ENCOUNTER — Telehealth: Payer: Self-pay | Admitting: Internal Medicine

## 2018-12-18 NOTE — Telephone Encounter (Signed)
New Message   Patient has lab appt 12/21/18 for BMP but states she had it done at Dr. Keane Police office 11/28/18 and wants to know if she should still have it done again.

## 2018-12-18 NOTE — Telephone Encounter (Signed)
Returned call to Pt.  Pt was advised to repeat BMP in July by [facility] --meaning lab work in August.  Per Pt she had lab work at her PCP office.  She wanted to know if she still needed more labs.  Advised we did receive lab work from PCP office.  Pt kidney function was generally the same.    Cancelled BMP for this Friday 9/11.  Advised would send to Dr. Harrington Challenger to make her aware.  Pt has recall for follow up in December.

## 2018-12-21 ENCOUNTER — Other Ambulatory Visit: Payer: Medicare Other

## 2018-12-23 ENCOUNTER — Other Ambulatory Visit: Payer: Self-pay | Admitting: Cardiology

## 2018-12-26 ENCOUNTER — Telehealth: Payer: Self-pay | Admitting: Internal Medicine

## 2018-12-26 NOTE — Telephone Encounter (Signed)
Patient wants to know if she needs to take an antibiotic prior to her dental cleaning she is having done next week.

## 2018-12-28 NOTE — Telephone Encounter (Signed)
Patient does not need to take antibiotics prior to dental work

## 2018-12-31 NOTE — Telephone Encounter (Signed)
Called and informed patient she does not need to take antibiotics prior to dental cleanings. Pt appreciative for information.

## 2019-01-21 ENCOUNTER — Other Ambulatory Visit: Payer: Self-pay

## 2019-01-21 ENCOUNTER — Ambulatory Visit
Admission: RE | Admit: 2019-01-21 | Discharge: 2019-01-21 | Disposition: A | Discharge status: Home / Self Care | Payer: Medicare Other | Source: Ambulatory Visit | Attending: Internal Medicine | Admitting: Internal Medicine

## 2019-01-21 DIAGNOSIS — Z1231 Encounter for screening mammogram for malignant neoplasm of breast: Secondary | ICD-10-CM | POA: Diagnosis not present

## 2019-01-22 ENCOUNTER — Ambulatory Visit (INDEPENDENT_AMBULATORY_CARE_PROVIDER_SITE_OTHER): Payer: Medicare Other | Admitting: Neurology

## 2019-01-22 ENCOUNTER — Other Ambulatory Visit: Payer: Self-pay | Admitting: Internal Medicine

## 2019-01-22 ENCOUNTER — Encounter: Payer: Self-pay | Admitting: Neurology

## 2019-01-22 VITALS — BP 143/77 | HR 83 | Temp 97.8°F | Ht 63.0 in | Wt 181.0 lb

## 2019-01-22 DIAGNOSIS — I2510 Atherosclerotic heart disease of native coronary artery without angina pectoris: Secondary | ICD-10-CM

## 2019-01-22 DIAGNOSIS — G4731 Primary central sleep apnea: Secondary | ICD-10-CM | POA: Diagnosis not present

## 2019-01-22 DIAGNOSIS — G4733 Obstructive sleep apnea (adult) (pediatric): Secondary | ICD-10-CM

## 2019-01-22 DIAGNOSIS — Z9989 Dependence on other enabling machines and devices: Secondary | ICD-10-CM

## 2019-01-22 DIAGNOSIS — Z789 Other specified health status: Secondary | ICD-10-CM

## 2019-01-22 MED ORDER — ATORVASTATIN CALCIUM 80 MG PO TABS: 80.0000 mg | ORAL_TABLET | Freq: Every day | ORAL | 2 refills | Status: DC

## 2019-01-22 NOTE — Patient Instructions (Addendum)
You had reasonable results on CPAP during your sleep study, slightly better on a higher level pressure of BiPAP of 19/15. I commend you for your CPAP compliance. Since you are not feeling improved and your sleep apnea scores are not optimal currently, I would like to suggest switching you over to BiPAP therapy with a pressure of 19/15.  We will send the order to your DME company and see you in follow-up in about 3 months.  Please continue to be compliant With treatment, you should be able to use the same mask of your choosing with the BiPAP machine.  I would like to do at least a 30-day BiPAP trial.  Please call after you have completed 30 days on the new machine so we can review your download and decide from there.

## 2019-01-22 NOTE — Progress Notes (Signed)
Order for bipap trial sent to [facility] via community message. Confirmation received that the order transmitted was successful.

## 2019-01-22 NOTE — Progress Notes (Signed)
Subjective:    Patient ID: Mallory Watts is a 78 y.o. female.  HPI     Interim history:   Mallory Watts is a 78 year old right-handed woman with an underlying medical history of coronary artery disease with status post CABG, atrial fibrillation, arthritis with status post left total knee replacement surgery, osteopenia, hypertension, reflux disease, breast cancer with status post left lumpectomy and chemo therapy and radiation, status post tonsillectomy, status post ACDF in October 2015, and borderline obesity, who presents for follow-up consultation of her obstructive sleep apnea after recent sleep study testing and starting CPAP therapy.  The patient is unaccompanied today.  I first met her on 09/04/2018 at the request of her primary care physician, at which time she reported daytime tiredness, family history of sleep apnea and nonrestorative sleep.  She was advised to proceed with sleep study.  She had a baseline sleep study on 10/11/2018, followed by a CPAP titration study on 10/30/2018.  I went over her test results with her in detail today.  Baseline sleep study from 10/11/2018 showed a sleep latency to persistent sleep of 37.5 minutes which is delayed, REM latency was 93.5%.  She had an increased percentage of night states sleep and a reduced percentage of REM sleep.  Total AHI was in the severe range at 58.7/h, O2 nadir was 80%.  She had no significant PLM's, EKG or EEG changes.  She was advised to return for a full night titration study.  She had a CPAP titration on 10/30/2018 which showed a sleep efficiency of 83.2%, sleep latency 20.5 minutes, REM latency delayed at 171 minutes.  She was titrated on CPAP via full facemask from 5 cm to 13 cm.  She was exhibiting central apneas at a higher pressure after 9 cm and was changed to standard BiPAP therapy.  She was titrated on BiPAP from 14/10 centimeters to a final pressure of 19/15.  Overall, she did reasonably well on CPAP of 13 cm.  She was advised to  proceed with treatment at home with CPAP.  Her set up date was 11/09/2018.  She called in early August complaining that the pressure was too high.  The pressure was reduced to 11 cm.  Today, 01/22/2019: I reviewed her CPAP compliance data from 12/22/2018 through 01/20/2019 which is a total of 30 days, during which time she used her CPAP 28 days with percent use days greater than 4 hours at 90%, indicating excellent compliance with an average usage of 5 hours and 34 minutes, residual AHI elevated at 17.2/h, with a central apnea index of 8.2/h, leak acceptable with a 95th percentile at 9.1 L/min on a pressure of 11 cm with EPR of 3. She reports that she has had trouble with the F 20 small fullface mask, it tends to digging into her face and is uncomfortable, she has now switched over to the F 30 I medium full facemask which is much more tolerable.  She is compliant with treatment but does not feel that much better, last night or 2 nights ago were good nights.  She is motivated to continue with treatment.  She has gained weight since the pandemic started.  She has struggled with lower extremity edema.  She was switched over from Xarelto to Eliquis because of blood in the urine.  She sees cardiology for this.  She still has some discomfort in the left knee, she had a knee replacement surgery.  The patient's allergies, current medications, family history, past medical history, past  social history, past surgical history and problem list were reviewed and updated as appropriate.   Previously:   09/04/2018: (She) reports nonrestorative sleep and feeling tired during the day.  She reports a family history of obstructive sleep apnea in 2 brothers. I reviewed your office note from 06/05/2018, which you kindly included. Her Epworth sleepiness score is 8 out of 24, fatigue severity score is 55 out of 63.  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  She has 2 cats in the household, sometimes in her bedroom, she does not watch TV in her bedroom.  She is not sure if she snores, Mallory Watts is hard of hearing.  Bedtime is between 1030 and 11 and rise time between 630 and 730.  She has nocturia at least twice per average night and has woken up with a headache.  She denies telltale symptoms of restless leg syndrome.  She has gained weight over time.  She quit smoking in 1970, drinks alcohol occasionally in the form of wine, does not indulge in caffeine typically.  Her Past Medical History Is Significant For: Past Medical History:  Diagnosis Date  . Arthritis   . Atrial fibrillation ([place])    not symptomatic -on metoprolol BID for this   . CAD (coronary artery disease)    s/p CAGB 2004 at [facility]  . Cancer ([place]) 1998   breast cancer--left  . Dysrhythmia    atrial flutter/fib  . GERD (gastroesophageal reflux disease)   . Hypertension   . Osteopenia   . Personal history of chemotherapy 1998  . Personal history of radiation therapy 1998  . PONV (postoperative nausea and vomiting)   . Shortness of breath    exertion  . STD (sexually transmitted disease)    HSV  . Urinary incontinence     Her Past Surgical History Is Significant For: Past Surgical History:  Procedure Laterality Date  . ANTERIOR CERVICAL DECOMP/DISCECTOMY FUSION N/A 01/14/2014   Procedure: ANTERIOR CERVICAL DECOMPRESSION/DISCECTOMY FUSION CERVICAL FOUR-FIVE,CERVICAL FIVE SIX;  Surgeon: [name], MD;  Location: [facility];  Service: Neurosurgery;  Laterality: N/A;  . BREAST EXCISIONAL BIOPSY Left   . BREAST LUMPECTOMY Left 1998  . BREAST SURGERY  1998   left lumpectomy for breast ca  . COLONOSCOPY    . CORONARY ARTERY BYPASS GRAFT  2004   at [place]  . DILATION AND CURETTAGE OF UTERUS  2006  . heart bypass  2003   -single  . POLYPECTOMY    . TONSILLECTOMY    . TOTAL KNEE ARTHROPLASTY Left 11/28/2016   Procedure: LEFT TOTAL KNEE ARTHROPLASTY;   Surgeon: [name], MD;  Location: [facility];  Service: Orthopedics;  Laterality: Left;  Canal block    Her Family History Is Significant For: Family History  Problem Relation Age of Onset  . Diabetes Mother        AODM  . Stroke Mother   . Diabetes Brother   . Stroke Father   . Colon cancer Neg Hx   . Colon polyps Neg Hx   . Esophageal cancer Neg Hx   . Rectal cancer Neg Hx   . Stomach cancer Neg Hx     Her Social History Is Significant For: Social History   Socioeconomic History  . Marital status: Single    Spouse name: Not on file  . Number of children: Not on file  . Years of education: Not  on file  . Highest education level: Not on file  Occupational History  . Not on file  Social Needs  . Financial resource strain: Not on file  . Food insecurity    Worry: Not on file    Inability: Not on file  . Transportation needs    Medical: Not on file    Non-medical: Not on file  Tobacco Use  . Smoking status: Former Research scientist (life sciences)  . Smokeless tobacco: Never Used  . Tobacco comment: smoked in college   Substance and Sexual Activity  . Alcohol use: Yes    Alcohol/week: 1.0 standard drinks    Types: 1 Glasses of wine per week    Comment: occasionally  . Drug use: No  . Sexual activity: Not Currently    Partners: Male    Birth control/protection: Post-menopausal  Lifestyle  . Physical activity    Days per week: Not on file    Minutes per session: Not on file  . Stress: Not on file  Relationships  . Social Herbalist on phone: Not on file    Gets together: Not on file    Attends religious service: Not on file    Active member of club or organization: Not on file    Attends meetings of clubs or organizations: Not on file    Relationship status: Not on file  Other Topics Concern  . Not on file  Social History Narrative  . Not on file    Her Allergies Are:  Allergies  Allergen Reactions  . Gadolinium Nausea Only and Other (See Comments)     Desc:  PATIENT FELT DIZZY AND NAUSEOUS AFTER GAD INJECTION. NO VOMITING,NO HIVES, NO RASH., Onset Date: [id number]   . Sulfonamide Derivatives Hives  :   Her Current Medications Are:  Outpatient Encounter Medications as of 01/22/2019  Medication Sig  . acetaminophen (TYLENOL) 500 MG tablet Take 500 mg by mouth every 6 (six) hours as needed.  Marland Kitchen acetaminophen (TYLENOL) 650 MG CR tablet Take 1,300 mg by mouth 2 (two) times daily as needed for pain.  Marland Kitchen alendronate (FOSAMAX) 70 MG tablet Take 70 mg by mouth once a week. Take with a full glass of water on an empty stomach.  Marland Kitchen apixaban (ELIQUIS) 5 MG TABS tablet Take 5 mg by mouth 2 (two) times daily.  Marland Kitchen atorvastatin (LIPITOR) 80 MG tablet Take 1 tablet (80 mg total) by mouth daily. Patient needs follow up appointment for future refills.  Please contact our office to schedule.  . Calcium Carbonate Antacid (TUMS PO) Take 1 tablet by mouth daily.  . fexofenadine (ALLEGRA) 180 MG tablet Take 180 mg by mouth daily as needed.   . Magnesium 250 MG TABS Take 250 mg by mouth daily as needed (LEG CRAMPS). Reported on 05/01/2015  . metoprolol succinate (TOPROL-XL) 50 MG 24 hr tablet TAKE 1 TABLET BY MOUTH TWICE A DAY  . mometasone (NASONEX) 50 MCG/ACT nasal spray Place 2 sprays into the nose daily.  . nitroGLYCERIN (NITROSTAT) 0.4 MG SL tablet Place 1 tablet (0.4 mg total) under the tongue every 5 (five) minutes as needed for chest pain. Reported on 05/01/2015  . omeprazole (PRILOSEC) 40 MG capsule Take 40 mg by mouth daily.  . potassium chloride SA (K-DUR,KLOR-CON) 20 MEQ tablet Take 20 mEq by mouth 2 (two) times daily.  . solifenacin (VESICARE) 5 MG tablet One daily as needed (Patient taking differently: Take 5 mg by mouth daily. One daily as needed)  .  triamterene-hydrochlorothiazide (MAXZIDE-25) 37.5-25 MG tablet Take 1 tablet by mouth daily.  . valACYclovir (VALTREX) 500 MG tablet Take 1 tablet (500 mg total) by mouth daily. Take one tablet by mouth twice a day for  3 days as needed for an outbreak.  Marland Kitchen ZETIA 10 MG tablet Take 10 mg by mouth daily.   No facility-administered encounter medications on file as of 01/22/2019.   :  Review of Systems:  Out of a complete 14 point review of systems, all are reviewed and negative with the exception of these symptoms as listed below:  Review of Systems  Neurological:       Pt presents today to discuss her cpap. Pt reports that the best night she has had with it was last night. She does not notice a difference in how she feels with pap therapy.    Objective:  Neurological Exam  Physical Exam Physical Examination:   Vitals:   01/22/19 1129  BP: (!) 143/77  Pulse: 83  Temp: 97.8 F (36.6 C)    General Examination: The patient is a very pleasant 78 y.o. female in no acute distress. She appears well-developed and well-nourished and well groomed.   HEENT: Normocephalic, atraumatic, pupils are equal, round and reactive to light, Corrective eyeglasses in place, extraocular tracking is preserved, face is symmetric with normal facial animation, airway examination reveals mild to moderate mouth dryness, otherwise mild airway crowding in stable findings, tongue protrudes centrally in palate elevates symmetrically, significantly deviated septum to the right.   Chest: Clear to auscultation without wheezing, rhonchi or crackles noted.  Heart: S1+S2+0, regular and normal without murmurs, rubs or gallops noted.   Abdomen: Soft, non-tender and non-distended with normal bowel sounds appreciated on auscultation.  Extremities: There is 1 to 2+ pitting edema in the distal lower extremities bilaterally.   Skin: Warm and dry without trophic changes noted.  Musculoskeletal: exam reveals no obvious joint deformities, tenderness or joint swelling or erythema.   Neurologically:  Mental status: The patient is awake, alert and oriented in all 4 spheres. Her immediate and remote memory, attention, language skills and  fund of knowledge are appropriate. There is no evidence of aphasia, agnosia, apraxia or anomia. Speech is clear with normal prosody and enunciation. Thought process is linear. Mood is normal and affect is normal.  Cranial nerves II - XII are as described above under HEENT exam. Motor exam: Normal bulk, strength and tone is noted. There is no tremor. Fine motor skills and coordination: intact grossly.  Cerebellar testing: No dysmetria or intention tremor. There is no truncal or gait ataxia.  Sensory exam: intact to light touch in the upper and lower extremities.  Gait, station and balance: She stands easily. No veering to one side is noted. No leaning to one side is noted. Posture is age-appropriate and stance is narrow based. Gait shows normal stride length and normal pace. No problems turning are noted.   Assessment and Plan:  In summary, Mallory Watts is a very pleasant 78 year old female with an underlying medical history of coronary artery disease with status post CABG, atrial fibrillation, arthritis with status post left total knee replacement surgery, osteopenia, hypertension, reflux disease, breast cancer with status post left lumpectomy and chemo therapy and radiation, status post tonsillectomy, status post ACDF in October 2015, and borderline obesity, who Presents for follow-up consultation of her obstructive sleep apnea.  Her baseline sleep study from 10/11/2018 showed severe obstructive sleep apnea.  She had a fairly challenging titration study  on 10/30/2018.  She did reasonably well on CPAP of 13 cm but could not tolerate the pressure at home.  She is now on CPAP of 11 cm with residual sleep apnea noted, average AHI around 17/h, she has a lesser component of central apneas but still some central sleep disordered breathing.  She also did okay during the titration study on high BiPAP treatment Pressure of 19/15 centimeters.  She is advised to embark on a 30-day BiPAP trial, she may be able to get a  loaner machine from her DME company which we will request.  I would like for her to call us after she has completed the BiPAP trial and we can review her download at the time and proceed from there.  She is advised to follow-up routinely in 3 months, continue to be compliant with treatment and she is commended for her treatment adherence thus far.  She has not quite benefited fully from treatment which is understandable as she has struggled with the mask but also her apnea scores are not optimal quite yet. We talked about her sleep study results in detail today.  We reviewed her compliance data as well.  She is advised to call for any interim questions or concerns.  I will plan to see her back after she has completed BiPAP trial and we will call her with the compliance review as well. I answered all her questions today and the patient was in agreement. I spent 30 minutes in total face-to-face time with the patient, more than 50% of which was spent in counseling and coordination of care, reviewing test results, reviewing medication and discussing or reviewing the diagnosis of OSA, its prognosis and treatment options. Pertinent laboratory and imaging test results that were available during this visit with the patient were reviewed by me and considered in my medical decision making (see chart for details).

## 2019-01-22 NOTE — Telephone Encounter (Signed)
°*  STAT* If patient is at the pharmacy, call can be transferred to refill team.   1. Which medications need to be refilled? (please list name of each medication and dose if known)  New prescription and Dr Harrington Challenger name on it please for Atorvastatin  2. Which pharmacy/location (including street and city if local pharmacy) is medication to be sent to? [facility]- [phone]  3. Do they need a 30 day or 90 day supply? 90 and refills

## 2019-01-22 NOTE — Telephone Encounter (Signed)
Pt's medication was sent to pt's pharmacy as requested. Confirmation received.  °

## 2019-02-04 ENCOUNTER — Other Ambulatory Visit: Payer: Self-pay | Admitting: Cardiology

## 2019-02-23 ENCOUNTER — Other Ambulatory Visit: Payer: Self-pay | Admitting: Cardiology

## 2019-02-23 ENCOUNTER — Other Ambulatory Visit: Payer: Self-pay | Admitting: Internal Medicine

## 2019-02-25 ENCOUNTER — Other Ambulatory Visit: Payer: Self-pay | Admitting: Internal Medicine

## 2019-02-25 ENCOUNTER — Telehealth: Payer: Self-pay | Admitting: Internal Medicine

## 2019-02-25 MED ORDER — ZETIA 10 MG PO TABS: 10.0000 mg | ORAL_TABLET | Freq: Every day | ORAL | 6 refills | Status: DC

## 2019-02-25 MED ORDER — EZETIMIBE 10 MG PO TABS: 10.0000 mg | ORAL_TABLET | Freq: Every day | ORAL | 3 refills | Status: DC

## 2019-02-25 NOTE — Telephone Encounter (Signed)
Pt calling stating that she needs Dr. Harrington Challenger to send in the generic for Zetia, because her insurance will not pay for the name brand and pt would like a 90 day supply sent in. Please address

## 2019-02-28 DIAGNOSIS — H43391 Other vitreous opacities, right eye: Secondary | ICD-10-CM | POA: Diagnosis not present

## 2019-02-28 DIAGNOSIS — H00021 Hordeolum internum right upper eyelid: Secondary | ICD-10-CM | POA: Diagnosis not present

## 2019-03-08 DIAGNOSIS — H00021 Hordeolum internum right upper eyelid: Secondary | ICD-10-CM | POA: Diagnosis not present

## 2019-03-13 ENCOUNTER — Other Ambulatory Visit: Payer: Self-pay | Admitting: Cardiology

## 2019-03-13 NOTE — Telephone Encounter (Signed)
Dr. Harrington Challenger patient

## 2019-04-24 ENCOUNTER — Other Ambulatory Visit: Payer: Self-pay

## 2019-04-24 ENCOUNTER — Encounter: Payer: Self-pay | Admitting: Neurology

## 2019-04-24 ENCOUNTER — Ambulatory Visit (INDEPENDENT_AMBULATORY_CARE_PROVIDER_SITE_OTHER): Payer: Medicare Other | Admitting: Neurology

## 2019-04-24 VITALS — BP 132/81 | HR 61 | Temp 96.9°F | Ht 63.0 in | Wt 178.0 lb

## 2019-04-24 DIAGNOSIS — Z789 Other specified health status: Secondary | ICD-10-CM

## 2019-04-24 DIAGNOSIS — G478 Other sleep disorders: Secondary | ICD-10-CM

## 2019-04-24 DIAGNOSIS — Z9989 Dependence on other enabling machines and devices: Secondary | ICD-10-CM | POA: Diagnosis not present

## 2019-04-24 DIAGNOSIS — G4719 Other hypersomnia: Secondary | ICD-10-CM

## 2019-04-24 DIAGNOSIS — G4731 Primary central sleep apnea: Secondary | ICD-10-CM | POA: Diagnosis not present

## 2019-04-24 DIAGNOSIS — E669 Obesity, unspecified: Secondary | ICD-10-CM

## 2019-04-24 DIAGNOSIS — Z951 Presence of aortocoronary bypass graft: Secondary | ICD-10-CM | POA: Diagnosis not present

## 2019-04-24 NOTE — Patient Instructions (Signed)
You can go back to using your CPAP machine.  BiPAP has not helped and in fact your apnea scores are worse while on BiPAP.  Please return your BiPAP machine, I will send a discontinuation order to [facility] as well.  As discussed, we will bring you in for get another sleep study for consideration of a more sophisticated BiPAP like machine called ASV if needed.

## 2019-04-24 NOTE — Progress Notes (Signed)
Subjective:    Patient ID: Mallory Watts is a 79 y.o. female.  HPI     Interim history:   Ms. Mallory Watts is a 79 year old right-handed woman with an underlying medical history of coronary artery disease with status post CABG, atrial fibrillation, arthritis with status post left total knee replacement surgery, osteopenia, hypertension, reflux disease, breast cancer with status post left lumpectomy and chemo therapy and radiation, status post tonsillectomy, status post ACDF in October 2015, and borderline obesity, who presents for follow-up consultation of her complex sleep apnea, after a trial of BiPAP therapy.  The patient is unaccompanied today.  I last saw her on 01/22/2019, at which time she was struggling with her CPAP, had trouble with her mask.  She had residual sleep disordered breathing with a central apnea index elevated around 8 and total AHI around 17/h for residual events.  She was advised to start BiPAP therapy at 19/15 for at least a month.  Her DME company started her on auto BiPAP therapy with maximum IPAP of 19 cm, minimum EPAP of 15 cm, 4 cm of pressure support.  Today, 04/24/2019: I reviewed her auto BiPAP compliance data from 03/24/2019 through 04/22/2019 which is a total of 30 days, during which time she used her machine 19 days with percent use days greater than 4 hours at 33%, average usage of 3 hours and 40 minutes for days on treatment, average AHI elevated at 21.3/h, which is worse from before, central apnea index at 10.8/h, leak high with a 95th percentile at 50.6 L/min, 95th percentile of IPAP pressure at 18.8 cm, 95th percentile of EPAP pressure at 14.9 cm. She reports that the mask is uncomfortable, she is supposed to get new supplies soon.  She also notices the leak.  She feels no better.  She has trouble with the BiPAP and it certainly no better than using her CPAP, she still has her original CPAP machine and would be willing to go back on it.  She is willing to return the  BiPAP machine and she is even willing to come back for yet another sleep study for proper titration.  She met with her new cardiologist, Dr. Harrington Challenger recently.  The patient's allergies, current medications, family history, past medical history, past social history, past surgical history and problem list were reviewed and updated as appropriate.    Previously:    I first met her on 09/04/2018 at the request of her primary care physician, at which time she reported daytime tiredness, family history of sleep apnea and nonrestorative sleep.  She was advised to proceed with sleep study.  She had a baseline sleep study on 10/11/2018, followed by a CPAP titration study on 10/30/2018.  I went over her test results with her in detail today.  Baseline sleep study from 10/11/2018 showed a sleep latency to persistent sleep of 37.5 minutes which is delayed, REM latency was 93.5%.  She had an increased percentage of night states sleep and a reduced percentage of REM sleep.  Total AHI was in the severe range at 58.7/h, O2 nadir was 80%.  She had no significant PLM's, EKG or EEG changes.  She was advised to return for a full night titration study.  She had a CPAP titration on 10/30/2018 which showed a sleep efficiency of 83.2%, sleep latency 20.5 minutes, REM latency delayed at 171 minutes.  She was titrated on CPAP via full facemask from 5 cm to 13 cm.  She was exhibiting central apneas at a  higher pressure after 9 cm and was changed to standard BiPAP therapy.  She was titrated on BiPAP from 14/10 centimeters to a final pressure of 19/15.  Overall, she did reasonably well on CPAP of 13 cm.  She was advised to proceed with treatment at home with CPAP.  Her set up date was 11/09/2018.  She called in early August complaining that the pressure was too high.  The pressure was reduced to 11 cm.   I reviewed her CPAP compliance data from 12/22/2018 through 01/20/2019 which is a total of 30 days, during which time she used her CPAP 28 days  with percent use days greater than 4 hours at 90%, indicating excellent compliance with an average usage of 5 hours and 34 minutes, residual AHI elevated at 17.2/h, with a central apnea index of 8.2/h, leak acceptable with a 95th percentile at 9.1 L/min on a pressure of 11 cm with EPR of 3.   09/04/2018: (She) reports nonrestorative sleep and feeling tired during the day.  She reports a family history of obstructive sleep apnea in 2 brothers. I reviewed your office note from 06/05/2018, which you kindly included. Her Epworth sleepiness score is 8 out of 24, fatigue severity score is 55 out of 63.  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  She has 2 cats in the household, sometimes in her bedroom, she does not watch TV in her bedroom.  She is not sure if she snores, Mallory Watts is hard of hearing.  Bedtime is between 1030 and 11 and rise time between 630 and 730.  She has nocturia at least twice per average night and has woken up with a headache.  She denies telltale symptoms of restless leg syndrome.  She has gained weight over time.  She quit smoking in 1970, drinks alcohol occasionally in the form of wine, does not indulge in caffeine typically.  Her Past Medical History Is Significant For: Past Medical History:  Diagnosis Date  . Arthritis   . Atrial fibrillation ([place])    not symptomatic -on metoprolol BID for this   . CAD (coronary artery disease)    s/p CAGB 2004 at [facility]  . Cancer ([place]) 1998   breast cancer--left  . Dysrhythmia    atrial flutter/fib  . GERD (gastroesophageal reflux disease)   . Hypertension   . Osteopenia   . Personal history of chemotherapy 1998  . Personal history of radiation therapy 1998  . PONV (postoperative nausea and vomiting)   . Shortness of breath    exertion  . STD (sexually transmitted disease)    HSV  . Urinary incontinence     Her Past Surgical History Is  Significant For: Past Surgical History:  Procedure Laterality Date  . ANTERIOR CERVICAL DECOMP/DISCECTOMY FUSION N/A 01/14/2014   Procedure: ANTERIOR CERVICAL DECOMPRESSION/DISCECTOMY FUSION CERVICAL FOUR-FIVE,CERVICAL FIVE SIX;  Surgeon: [name], MD;  Location: [facility];  Service: Neurosurgery;  Laterality: N/A;  . BREAST EXCISIONAL BIOPSY Left   . BREAST LUMPECTOMY Left 1998  . BREAST SURGERY  1998   left lumpectomy for breast ca  . COLONOSCOPY    . CORONARY ARTERY BYPASS GRAFT  2004   at [place]  . DILATION AND CURETTAGE OF UTERUS  2006  . heart bypass  2003   -single  . POLYPECTOMY    . TONSILLECTOMY    . TOTAL KNEE ARTHROPLASTY Left 11/28/2016   Procedure:  LEFT TOTAL KNEE ARTHROPLASTY;  Surgeon: [name], MD;  Location: [facility];  Service: Orthopedics;  Laterality: Left;  Canal block    Her Family History Is Significant For: Family History  Problem Relation Age of Onset  . Diabetes Mother        AODM  . Stroke Mother   . Diabetes Brother   . Stroke Father   . Colon cancer Neg Hx   . Colon polyps Neg Hx   . Esophageal cancer Neg Hx   . Rectal cancer Neg Hx   . Stomach cancer Neg Hx     Her Social History Is Significant For: Social History   Socioeconomic History  . Marital status: Single    Spouse name: Not on file  . Number of children: Not on file  . Years of education: Not on file  . Highest education level: Not on file  Occupational History  . Not on file  Tobacco Use  . Smoking status: Former Research scientist (life sciences)  . Smokeless tobacco: Never Used  . Tobacco comment: smoked in college   Substance and Sexual Activity  . Alcohol use: Yes    Alcohol/week: 1.0 standard drinks    Types: 1 Glasses of wine per week    Comment: occasionally  . Drug use: No  . Sexual activity: Not Currently    Partners: Male    Birth control/protection: Post-menopausal  Other Topics Concern  . Not on file  Social History Narrative  . Not on file   Social Determinants of Health    Financial Resource Strain:   . Difficulty of Paying Living Expenses: Not on file  Food Insecurity:   . Worried About Charity fundraiser in the Last Year: Not on file  . Ran Out of Food in the Last Year: Not on file  Transportation Needs:   . Lack of Transportation (Medical): Not on file  . Lack of Transportation (Non-Medical): Not on file  Physical Activity:   . Days of Exercise per Week: Not on file  . Minutes of Exercise per Session: Not on file  Stress:   . Feeling of Stress : Not on file  Social Connections:   . Frequency of Communication with Friends and Family: Not on file  . Frequency of Social Gatherings with Friends and Family: Not on file  . Attends Religious Services: Not on file  . Active Member of Clubs or Organizations: Not on file  . Attends Archivist Meetings: Not on file  . Marital Status: Not on file    Her Allergies Are:  Allergies  Allergen Reactions  . Gadolinium Nausea Only and Other (See Comments)     Desc: PATIENT FELT DIZZY AND NAUSEOUS AFTER GAD INJECTION. NO VOMITING,NO HIVES, NO RASH., Onset Date: [id number]   . Sulfonamide Derivatives Hives  :   Her Current Medications Are:  Outpatient Encounter Medications as of 04/24/2019  Medication Sig  . acetaminophen (TYLENOL) 500 MG tablet Take 500 mg by mouth every 6 (six) hours as needed.  Marland Kitchen acetaminophen (TYLENOL) 650 MG CR tablet Take 1,300 mg by mouth 2 (two) times daily as needed for pain.  Marland Kitchen alendronate (FOSAMAX) 70 MG tablet Take 70 mg by mouth once a week. Take with a full glass of water on an empty stomach.  Marland Kitchen apixaban (ELIQUIS) 5 MG TABS tablet Take 5 mg by mouth 2 (two) times daily.  Marland Kitchen atorvastatin (LIPITOR) 80 MG tablet Take 1 tablet (80 mg total) by mouth daily.  . Calcium  Carbonate Antacid (TUMS PO) Take 1 tablet by mouth daily.  Marland Kitchen ezetimibe (ZETIA) 10 MG tablet Take 1 tablet (10 mg total) by mouth daily.  . fexofenadine (ALLEGRA) 180 MG tablet Take 180 mg by mouth daily as  needed.   . Magnesium 250 MG TABS Take 250 mg by mouth daily as needed (LEG CRAMPS). Reported on 05/01/2015  . metoprolol succinate (TOPROL-XL) 50 MG 24 hr tablet TAKE 1 TABLET BY MOUTH TWICE A DAY  . mometasone (NASONEX) 50 MCG/ACT nasal spray Place 2 sprays into the nose daily.  . nitroGLYCERIN (NITROSTAT) 0.4 MG SL tablet Place 1 tablet (0.4 mg total) under the tongue every 5 (five) minutes as needed for chest pain. Reported on 05/01/2015  . omeprazole (PRILOSEC) 40 MG capsule Take 40 mg by mouth daily.  . potassium chloride SA (K-DUR,KLOR-CON) 20 MEQ tablet Take 20 mEq by mouth 2 (two) times daily.  . solifenacin (VESICARE) 5 MG tablet One daily as needed (Patient taking differently: Take 5 mg by mouth daily. One daily as needed)  . triamterene-hydrochlorothiazide (MAXZIDE-25) 37.5-25 MG tablet Take 1 tablet by mouth daily.  . valACYclovir (VALTREX) 500 MG tablet Take 1 tablet (500 mg total) by mouth daily. Take one tablet by mouth twice a day for 3 days as needed for an outbreak.  . [DISCONTINUED] ezetimibe (ZETIA) 10 MG tablet Please specify directions, refills and quantity   No facility-administered encounter medications on file as of 04/24/2019.  :  Review of Systems:  Out of a complete 14 point review of systems, all are reviewed and negative with the exception of these symptoms as listed below: Review of Systems  Neurological:       Pt reports trouble with bipap.. Pt sts the mask does not have a good fit. Pt sts she should be receiving a new mask soon. Pt sts throughout the night her machine will alarm of leaks being present too.     Objective:  Neurological Exam  Physical Exam Physical Examination:   Vitals:   04/24/19 1132  BP: 132/81  Pulse: 61  Temp: (!) 96.9 F (36.1 C)    General Examination: The patient is a very pleasant 79 y.o. female in no acute distress. She appears well-developed and well-nourished and well groomed.   HEENT:Normocephalic, atraumatic, pupils  are equal, round and reactive to light, Corrective eyeglasses in place, extraocular tracking is preserved, face is symmetric with normal facial animation, airway examination reveals moderate mouth dryness, otherwise mild airway crowding in stable findings, tongue protrudes centrally in palate elevates symmetrically, significantly deviated septum to the right.   Chest:Clear to auscultation without wheezing, rhonchi or crackles noted.  Heart:S1+S2+0, regular and normal without murmurs, rubs or gallops noted.   Abdomen:Soft, non-tender and non-distended with normal bowel sounds appreciated on auscultation.  Extremities:There is1 to 2+pitting edema in the distal lower extremities bilaterally.   Skin: Warm and dry without trophic changes noted.  Musculoskeletal: exam reveals no obvious joint deformities, tenderness or joint swelling or erythema.   Neurologically:  Mental status: The patient is awake, alert and oriented in all 4 spheres.Herimmediate and remote memory, attention, language skills and fund of knowledge are appropriate. There is no evidence of aphasia, agnosia, apraxia or anomia. Speech is clear with normal prosody and enunciation. Thought process is linear. Mood is normaland affect is normal.  Cranial nerves II - XII are as described above under HEENT exam. Motor exam: Normal bulk, strength and tone is noted. There is no tremor. Fine motor skills and  coordination: intact grossly.  Cerebellar testing: No dysmetria or intention tremor. There is no truncal or gait ataxia.  Sensory exam: intact to light touch in the upper and lower extremities.   Gait, station and balance: no issues.   Assessmentand Plan:  In summary,Mallory R Reeseis a very pleasant 4 year oldfemalewith an underlying medical history of coronary artery disease with status post CABG, atrial fibrillation, arthritis with status post left total knee replacement surgery, osteopenia, hypertension, reflux  disease, breast cancer with status post left lumpectomy and chemo therapy and radiation, status post tonsillectomy, status post ACDF in October 2015, and borderline obesity, who Presents for follow-up consultation of her Complex sleep apnea which has a primary central component at this point.  She has been on BiPAP therapy which did not help, her residual AHI is actually more elevated compared to when she was on her CPAP.  She is struggling to tolerate the BiPAP, she is struggling to get a better fitting mask.  She has supplies pending at this point.  She would be willing to return the BiPAP and I will put a D/C order in.  She is advised to go back to her CPAP therapy for now and we will request a formal overnight titration study for BiPAP consideration versus BiPAP ST, versus ASV if needed.  I explained these different modalities to her today.  She is willing to come in for yet another sleep study. Her baseline sleep study from 10/11/2018 showed severe sleep apnea.  She had a fairly challenging titration study on 10/30/2018. She could not tolerate CPAP of 13 cm at home.  She was then started on CPAP of 11 cm with residual sleep apnea noted, average AHI around 17/h,with central sleep disordered breathing. She is commended for her treatment adherence thus far.  She has not quite benefited fully from treatment which is understandable as she has struggled with the mask but also her apnea scores are not optimal quite yet. We reviewed her compliance data today and reviewed her sleep study findings from last year as well.  I plan to see her back after she has completed her sleep study for titration with BiPAP vs ASV. I answered all her questions today and she was in agreement.

## 2019-05-01 ENCOUNTER — Ambulatory Visit: Payer: Medicare Other | Attending: Internal Medicine

## 2019-05-01 DIAGNOSIS — Z23 Encounter for immunization: Secondary | ICD-10-CM

## 2019-05-01 NOTE — Progress Notes (Signed)
   Covid-19 Vaccination Clinic  Name:  Mallory Watts    MRN: [id number] DOB: 07-Sep-1940  05/01/2019  Mallory Watts was observed post Covid-19 immunization for 15 minutes without incidence. She was provided with Vaccine Information Sheet and instruction to access the V-Safe system.   Mallory Watts was instructed to call 911 with any severe reactions post vaccine: Marland Kitchen Difficulty breathing  . Swelling of your face and throat  . A fast heartbeat  . A bad rash all over your body  . Dizziness and weakness    Immunizations Administered    Name Date Dose VIS Date Route   Pfizer COVID-19 Vaccine 05/01/2019  6:45 PM 0.3 mL 03/22/2019 Intramuscular   Manufacturer: [place]   Lot: [id number]   [place]: [id number]

## 2019-05-03 ENCOUNTER — Ambulatory Visit (INDEPENDENT_AMBULATORY_CARE_PROVIDER_SITE_OTHER): Payer: Medicare Other | Admitting: Internal Medicine

## 2019-05-03 ENCOUNTER — Encounter: Payer: Self-pay | Admitting: Internal Medicine

## 2019-05-03 ENCOUNTER — Other Ambulatory Visit: Payer: Self-pay

## 2019-05-03 VITALS — BP 132/68 | HR 69 | Ht 63.0 in | Wt 180.4 lb

## 2019-05-03 DIAGNOSIS — I2510 Atherosclerotic heart disease of native coronary artery without angina pectoris: Secondary | ICD-10-CM

## 2019-05-03 NOTE — Progress Notes (Signed)
Cardiology Office Note   Date:  05/03/2019   ID:  Mallory Watts, Mallory Watts 12/25/1940, MRN [id number]  PCP:  [name], MD  Cardiologist:   [name], MD   F/U of CAD   History of Present Illness: Mallory Watts is a 79 y.o. female with a history of CAD.  I saw her back in June 2020.  This was a televisit.  Patient is status post CABG in 2004.  Followed by Dr. [name] at that time.  The patient was seen by Dr. [name] back in December 2019.  Echocardiogram done at that time showed LVEF was normal and  there was left atrial enlargement.  There was moderate aortic insufficiency.  The patient also had a monitor which showed atrial flutter intermittently.  She also has a history of paroxysmal atrial fib and atrial flutter that was picked up on the monitor.  She has been maintained on Xarelto  since March 2020.  She was switched to Eliquis because of hematuria.  I saw the patient for the first time as a televisit back in June.  Since June the patient has been doing fairly well.  She denies significant chest pain.  She is working with neurology for sleep apnea and has had problems finding/regulating her CPAP.  Patient denies fluttering.  She does say she knows when she is in A. fib though.  She says with moving furniture extreme activity she will have a little discomfort short-lived.  No dizziness.  Patient does note some swelling in her legs by the end of the day.  She does not add salt but is not as  careful as she could be with salt intake  Current Meds  Medication Sig  . acetaminophen (TYLENOL) 500 MG tablet Take 500 mg by mouth every 6 (six) hours as needed.  Marland Kitchen acetaminophen (TYLENOL) 650 MG CR tablet Take 1,300 mg by mouth 2 (two) times daily as needed for pain.  Marland Kitchen alendronate (FOSAMAX) 70 MG tablet Take 70 mg by mouth once a week. Take with a full glass of water on an empty stomach.  Marland Kitchen apixaban (ELIQUIS) 5 MG TABS tablet Take 5 mg by mouth 2 (two) times daily.  Marland Kitchen atorvastatin  (LIPITOR) 80 MG tablet Take 1 tablet (80 mg total) by mouth daily.  . Calcium Carbonate Antacid (TUMS PO) Take 1 tablet by mouth daily.  Marland Kitchen ezetimibe (ZETIA) 10 MG tablet Take 1 tablet (10 mg total) by mouth daily.  . fexofenadine (ALLEGRA) 180 MG tablet Take 180 mg by mouth daily as needed.   . Magnesium 250 MG TABS Take 250 mg by mouth daily as needed (LEG CRAMPS). Reported on 05/01/2015  . metoprolol succinate (TOPROL-XL) 50 MG 24 hr tablet TAKE 1 TABLET BY MOUTH TWICE A DAY  . mometasone (NASONEX) 50 MCG/ACT nasal spray Place 2 sprays into the nose daily.  . nitroGLYCERIN (NITROSTAT) 0.4 MG SL tablet Place 1 tablet (0.4 mg total) under the tongue every 5 (five) minutes as needed for chest pain. Reported on 05/01/2015  . omeprazole (PRILOSEC) 40 MG capsule Take 40 mg by mouth daily.  . potassium chloride SA (K-DUR,KLOR-CON) 20 MEQ tablet Take 20 mEq by mouth 2 (two) times daily.  . solifenacin (VESICARE) 5 MG tablet One daily as needed (Patient taking differently: Take 5 mg by mouth daily. One daily as needed)  . triamterene-hydrochlorothiazide (MAXZIDE-25) 37.5-25 MG tablet Take 1 tablet by mouth daily.  . valACYclovir (VALTREX) 500 MG tablet Take 1 tablet (500 mg  total) by mouth daily. Take one tablet by mouth twice a day for 3 days as needed for an outbreak.     Allergies:   Gadolinium and Sulfonamide derivatives   Past Medical History:  Diagnosis Date  . Arthritis   . Atrial fibrillation ([place])    not symptomatic -on metoprolol BID for this   . CAD (coronary artery disease)    s/p CAGB 2004 at [facility]  . Cancer ([place]) 1998   breast cancer--left  . Dysrhythmia    atrial flutter/fib  . GERD (gastroesophageal reflux disease)   . Hypertension   . Osteopenia   . Personal history of chemotherapy 1998  . Personal history of radiation therapy 1998  . PONV (postoperative nausea and vomiting)   . Shortness of breath    exertion  . STD (sexually transmitted disease)    HSV  . Urinary  incontinence     Past Surgical History:  Procedure Laterality Date  . ANTERIOR CERVICAL DECOMP/DISCECTOMY FUSION N/A 01/14/2014   Procedure: ANTERIOR CERVICAL DECOMPRESSION/DISCECTOMY FUSION CERVICAL FOUR-FIVE,CERVICAL FIVE SIX;  Surgeon: [name], MD;  Location: [place];  Service: Neurosurgery;  Laterality: N/A;  . BREAST EXCISIONAL BIOPSY Left   . BREAST LUMPECTOMY Left 1998  . BREAST SURGERY  1998   left lumpectomy for breast ca  . COLONOSCOPY    . CORONARY ARTERY BYPASS GRAFT  2004   at [place]  . DILATION AND CURETTAGE OF UTERUS  2006  . heart bypass  2003   -single  . POLYPECTOMY    . TONSILLECTOMY    . TOTAL KNEE ARTHROPLASTY Left 11/28/2016   Procedure: LEFT TOTAL KNEE ARTHROPLASTY;  Surgeon: [name], MD;  Location: [facility];  Service: Orthopedics;  Laterality: Left;  Canal block     Social History:  The patient  reports that she has quit smoking. She has never used smokeless tobacco. She reports current alcohol use of about 1.0 standard drinks of alcohol per week. She reports that she does not use drugs.   Family History:  The patient's family history includes Diabetes in her brother and mother; Stroke in her father and mother.    ROS:  Please see the history of present illness. All other systems are reviewed and  Negative to the above problem except as noted.    PHYSICAL EXAM: VS:  BP 132/68   Pulse 69   Ht 5\' 3"  (1.6 m)   Wt 180 lb 6.4 oz (81.8 kg)   LMP 04/11/1994   BMI 31.96 kg/m   GEN: Well nourished, well developed, in no acute distress  HEENT: normal  Neck: no JVD   No carotid bruits    Cardiac: RRR; no murmurs, rubs, or gallops,no edema  Respiratory:  clear to auscultation bilaterally, normal work of breathing GI: soft, nontender, nondistended, + BS  No hepatomegaly  MS: no deformity Moving all extremities   Skin: warm and dry, no rash Neuro:  Strength and sensation are intact Psych: euthymic mood, full affect   EKG:  EKG is ordered today.   Sinus rhythm 69 bpm.  Nonspecific ST changes   Lipid Panel No results found for: CHOL, TRIG, HDL, CHOLHDL, VLDL, LDLCALC, LDLDIRECT    Wt Readings from Last 3 Encounters:  05/03/19 180 lb 6.4 oz (81.8 kg)  04/24/19 178 lb (80.7 kg)  01/22/19 181 lb (82.1 kg)      ASSESSMENT AND PLAN:  1.  CAD.  Patient has remote CABG in 2004.  She had stress test with  Dr. [name] last was about a year and a half ago.  Denies chest pains other than the fleeting.  I am not convinced angina.  Breathing is relatively stable.  I would continue to follow for now I would not plan any further stress testing at this time.  Only if she notes a change in her symptoms.  I encouraged her to increase her physical activity which can act as a barometer  2.  Aortic insufficiency.  I have reviewed echo I think it is more in the mild to moderate range.  I do not hear a murmur on exam.  I would follow  3 dyslipidemia we will get lipids from Dr. Keane Police office.  4 history of PAF.  Keep on apixaban.  Seems to be tolerating.  No significant symptoms.  We will check labs.  5.  Sleep apnea will review records from neurology and discuss with sleep. I will set to see the patient back in the fall sooner with problems    Current medicines are reviewed at length with the patient today.  The patient does not have concerns regarding medicines.  Signed, [name], MD  05/03/2019 10:19 AM    [address], [place], [place]  [address] Phone: [phone]; Fax: [phone]

## 2019-05-03 NOTE — Patient Instructions (Signed)
Medication Instructions:  No changes *If you need a refill on your cardiac medications before your next appointment, please call your pharmacy*  Lab Work: None--we will get copy of your labs from Dr. Virgina Jock for Dr. Harrington Challenger to review.  Testing/Procedures: none  Follow-Up: At [facility], you and your health needs are our priority.  As part of our continuing mission to provide you with exceptional heart care, we have created designated Provider Care Teams.  These Care Teams include your primary Cardiologist (physician) and Advanced Practice Providers (APPs -  Physician Assistants and Nurse Practitioners) who all work together to provide you with the care you need, when you need it.  Your next appointment:   9 month(s)  The format for your next appointment:   Either In Person or Virtual  Provider:   You may see [name], MD or one of the following Advanced Practice Providers on your designated Care Team:    Richardson Dopp, PA-C  [place], [place]  Daune Perch, NP   Other Instructions

## 2019-05-17 ENCOUNTER — Other Ambulatory Visit (HOSPITAL_COMMUNITY)
Admission: RE | Admit: 2019-05-17 | Discharge: 2019-05-17 | Disposition: A | Discharge status: Home / Self Care | Payer: Medicare Other | Source: Ambulatory Visit | Attending: Neurology | Admitting: Neurology

## 2019-05-17 DIAGNOSIS — Z20822 Contact with and (suspected) exposure to covid-19: Secondary | ICD-10-CM | POA: Insufficient documentation

## 2019-05-17 DIAGNOSIS — Z01812 Encounter for preprocedural laboratory examination: Secondary | ICD-10-CM | POA: Diagnosis not present

## 2019-05-17 LAB — SARS CORONAVIRUS 2 (TAT 6-24 HRS): SARS Coronavirus 2: NEGATIVE

## 2019-05-20 ENCOUNTER — Ambulatory Visit (INDEPENDENT_AMBULATORY_CARE_PROVIDER_SITE_OTHER): Payer: Medicare Other | Admitting: Neurology

## 2019-05-20 DIAGNOSIS — Z789 Other specified health status: Secondary | ICD-10-CM

## 2019-05-20 DIAGNOSIS — G4731 Primary central sleep apnea: Secondary | ICD-10-CM | POA: Diagnosis not present

## 2019-05-20 DIAGNOSIS — Z951 Presence of aortocoronary bypass graft: Secondary | ICD-10-CM

## 2019-05-20 DIAGNOSIS — G4720 Circadian rhythm sleep disorder, unspecified type: Secondary | ICD-10-CM

## 2019-05-20 DIAGNOSIS — E669 Obesity, unspecified: Secondary | ICD-10-CM

## 2019-05-20 DIAGNOSIS — Z9989 Dependence on other enabling machines and devices: Secondary | ICD-10-CM

## 2019-05-20 DIAGNOSIS — G4733 Obstructive sleep apnea (adult) (pediatric): Secondary | ICD-10-CM

## 2019-05-20 DIAGNOSIS — G4719 Other hypersomnia: Secondary | ICD-10-CM

## 2019-05-20 DIAGNOSIS — G478 Other sleep disorders: Secondary | ICD-10-CM

## 2019-05-22 ENCOUNTER — Ambulatory Visit: Payer: Medicare Other | Attending: Internal Medicine

## 2019-05-22 DIAGNOSIS — Z23 Encounter for immunization: Secondary | ICD-10-CM | POA: Insufficient documentation

## 2019-05-22 NOTE — Progress Notes (Signed)
   Covid-19 Vaccination Clinic  Name:  Mallory Watts    MRN: [id number] DOB: June 15, 1940  05/22/2019  [name] was observed post Covid-19 immunization for 15 minutes without incidence. She was provided with Vaccine Information Sheet and instruction to access the V-Safe system.   [name] was instructed to call 911 with any severe reactions post vaccine: Marland Kitchen Difficulty breathing  . Swelling of your face and throat  . A fast heartbeat  . A bad rash all over your body  . Dizziness and weakness    Immunizations Administered    Name Date Dose VIS Date Route   Pfizer COVID-19 Vaccine 05/22/2019 11:55 AM 0.3 mL 03/22/2019 Intramuscular   Manufacturer: [place]   Lot: [id number]   [place]: [id number]

## 2019-06-03 NOTE — Progress Notes (Signed)
Patient had a BiPAP titrations study on 05/20/19, last seen on 04/24/19, she was on autoBiPAP and had failed CPAP prior to that.  Please call and inform patient that I have entered an order for treatment with positive airway pressure (PAP) treatment for obstructive sleep apnea (OSA). She did better during the latest sleep study with BiPAP ST. We will change her setting from autoBiPAP to BiPAP of 20/15 cm with a back up rate of 16/min, she did fairly well with this setting during the sleep study. We will arrange for a machine for home use through her DME company. I will see the patient back in follow-up in about 2-3 months. Please also explain to the patient that I will be looking out for compliance data, which can be downloaded from the machine (stored on an SD card, that is inserted in the machine) or via remote access through a modem, that is built into the machine. At the time of the followup appointment we will discuss sleep study results and how it is going with PAP treatment at home. Please advise patient to bring Her machine at the time of the first FU visit, even though this is cumbersome. Bringing the machine for every visit after that will likely not be needed, but often helps for the first visit to troubleshoot if needed. Please re-enforce the importance of compliance with treatment and the need for [place] to monitor compliance data - often an insurance requirement and actually good feedback for the patient as far as how they are doing.  Also remind patient, that any interim PAP machine or mask issues should be first addressed with the DME company, as they can often help better with technical and mask fit issues. Please ask if patient has a preference regarding DME company.  Please also make sure, the patient has a follow-up appointment with me in about 10 weeks from the setup date, thanks. May see one of our nurse practitioners if needed for proper timing of the FU appointment.  Please fax or rout report  to the referring provider. Thanks,   Star Age, MD, PhD [facility])

## 2019-06-03 NOTE — Procedures (Signed)
PATIENT'S NAME:  Mallory, Watts DOB:      1940-04-22      MR#:    [id number]     DATE OF RECORDING: 05/20/2019 REFERRING M.D.:  [name] MD Study Performed:   CPAP  Titration HISTORY: 79 year old woman with an underlying medical history of coronary artery disease with status post CABG, atrial fibrillation, arthritis with status post left total knee replacement surgery, osteopenia, hypertension, reflux disease, breast cancer with status post left lumpectomy and chemo therapy and radiation, status post tonsillectomy, status post ACDF in October 2015, and borderline obesity, who presents for a BiPAP titrations study to optimize treatment of her complex sleep apnea. She has been on autoBiPAP with an average AHI of 21.3/hour. She failed CPAP therapy. The patient endorsed the Epworth Sleepiness Scale at 8/24 points. The patient's weight 178 pounds with a height of 63 (inches), resulting in a BMI of 31.6 kg/m2.   CURRENT MEDICATIONS: Tylenol, Fosamax, ASA 81mg , Lipitor, Tums, Allegra, Magnesium, Antivert, Toprol-XL, Nasonex, Nitrostat, Prilosec, K-Dur, Vesicare, Maxide, Valtrex, Zetia, Zofran, Xarelto   PROCEDURE:  This is a multichannel digital polysomnogram utilizing the SomnoStar 11.2 system.  Electrodes and sensors were applied and monitored per AASM Specifications.   EEG, EOG, Chin and Limb EMG, were sampled at 200 Hz.  ECG, Snore and Nasal Pressure, Thermal Airflow, Respiratory Effort, CPAP Flow and Pressure, Oximetry was sampled at 50 Hz. Digital video and audio were recorded.      The patient used a small N30i interface. BiPAP was initiated at 8/4 cmH20 with heated humidity per AASM standards and pressure was advanced to 12/8 cmH20 because of hypopneas, apneas and desaturations. Her AHI was 67.5/hour, with almost exclusively central events. She was, therefore switched to BIPAP ST of 13/9 cm at epoch 463 (01:58) with a rate of 16/min and pressure was advanced to 20/15 cmH20, at which point her AHI was  0/hour, with O2 nadir of 78%. She achieved only non-supine NREM sleep at the final titration pressure.   Lights Out was at 22:10 and Lights On at 05:17. Total recording time (TRT) was 427.5 minutes, with a total sleep time (TST) of 371.5 minutes. The patient's sleep latency was 10.5 minutes. REM latency was 141.5 minutes.  The sleep efficiency was 86.9 %.    SLEEP ARCHITECTURE: WASO (Wake after sleep onset) was 54.5 minutes with mild to moderate sleep fragmentation noted. There were 10.5 minutes in Stage N1, 306.5 minutes Stage N2, 10 minutes Stage N3 and 44.5 minutes in Stage REM.  The percentage of Stage N1 was 2.8%, Stage N2 was 82.5%, which is markedly increased, Stage N3 was 2.7% and Stage R (REM sleep) was 12.%, which is reduced. The arousals were noted as: 69 were spontaneous, 47 were associated with PLMs, 128 were associated with respiratory events.  RESPIRATORY ANALYSIS:  There was a total of 235 respiratory events: 15 obstructive apneas, 134 central apneas and 0 mixed apneas with a total of 149 apneas and an apnea index (AI) of 24.1 /hour. There were 86 hypopneas with a hypopnea index of 13.9/hour. The patient also had 0 respiratory event related arousals (RERAs).      The total APNEA/HYPOPNEA INDEX  (AHI) was 38. /hour and the total RESPIRATORY DISTURBANCE INDEX was 38. /hour  9 events occurred in REM sleep and 226 events in NREM. The REM AHI was 12.1 /hour versus a non-REM AHI of 41.5 /hour.  The patient spent 104 minutes of total sleep time in the supine position and 268 minutes in  non-supine. The supine AHI was 46.7, versus a non-supine AHI of 34.6.  OXYGEN SATURATION & C02:  The baseline 02 saturation was 94%, with the lowest being 78%. Time spent below 89% saturation equaled 44 minutes.  PERIODIC LIMB MOVEMENTS:  The patient had a total of 82 Periodic Limb Movements. The Periodic Limb Movement (PLM) index was 13.2 and the PLM Arousal index was 7.6 /hour.  Audio and video analysis did  not show any abnormal or unusual movements, behaviors, phonations or vocalizations. The patient took 2 bathroom breaks. The EKG was in keeping with normal sinus rhythm (NSR).  Post-study, the patient indicated that sleep was the same as usual.   DIAGNOSIS 1. Primary Central Sleep Apnea  2. Obstructive Sleep Apnea (OSA) 3. Dysfunctions associated with sleep stages or arousal from sleep  RECOMMENDATIONS: 1. This study demonstrates significant improvement of the patient's obstructive sleep apnea with BiPAP ST, due to ongoing central apneas noted on standard BiPAP. I will recommend treatment at home with BiPAP ST of 20/15 cm with a rate of 16/min. The patient should be reminded to be fully compliant with PAP therapy to improve sleep related symptoms and decrease long term cardiovascular risks. The patient should be reminded, that it may take up to 3 months to get fully used to using PAP with all planned sleep. The earlier full compliance is achieved, the better long term compliance tends to be. Please note that untreated obstructive sleep apnea may carry additional perioperative morbidity. Patients with significant obstructive sleep apnea should receive perioperative PAP therapy and the surgeons and particularly the anesthesiologist should be informed of the diagnosis and the severity of the sleep disordered breathing. 2. She may benefit from monitoring her O2 saturation at home, when established on BiPAP ST with an ONO.  3. This study shows sleep fragmentation and abnormal sleep stage percentages; these are nonspecific findings and per se do not signify an intrinsic sleep disorder or a cause for the patient's sleep-related symptoms. Causes include (but are not limited to) the first night effect of the sleep study, circadian rhythm disturbances, medication effect or an underlying mood disorder or medical problem.  4. The patient should be cautioned not to drive, work at heights, or operate dangerous or  heavy equipment when tired or sleepy. Review and reiteration of good sleep hygiene measures should be pursued with any patient. 5. The patient will be seen in follow-up in the sleep clinic at [facility] for discussion of the test results, symptom and treatment compliance review, further management strategies, etc. The referring provider will be notified of the test results.   I certify that I have reviewed the entire raw data recording prior to the issuance of this report in accordance with the Standards of Accreditation of the American Academy of Sleep Medicine (AASM)   Star Age, MD, PhD Diplomat, American Board of Neurology and Sleep Medicine (Neurology and Sleep Medicine)

## 2019-06-03 NOTE — Addendum Note (Signed)
Addended by: Star Age on: 06/03/2019 05:56 PM   Modules accepted: Orders

## 2019-06-04 DIAGNOSIS — Z853 Personal history of malignant neoplasm of breast: Secondary | ICD-10-CM | POA: Diagnosis not present

## 2019-06-04 DIAGNOSIS — M1990 Unspecified osteoarthritis, unspecified site: Secondary | ICD-10-CM | POA: Diagnosis not present

## 2019-06-04 DIAGNOSIS — N1831 Chronic kidney disease, stage 3a: Secondary | ICD-10-CM | POA: Diagnosis not present

## 2019-06-04 DIAGNOSIS — G4700 Insomnia, unspecified: Secondary | ICD-10-CM | POA: Diagnosis not present

## 2019-06-04 DIAGNOSIS — N3281 Overactive bladder: Secondary | ICD-10-CM | POA: Diagnosis not present

## 2019-06-04 DIAGNOSIS — E785 Hyperlipidemia, unspecified: Secondary | ICD-10-CM | POA: Diagnosis not present

## 2019-06-04 DIAGNOSIS — I1310 Hypertensive heart and chronic kidney disease without heart failure, with stage 1 through stage 4 chronic kidney disease, or unspecified chronic kidney disease: Secondary | ICD-10-CM | POA: Diagnosis not present

## 2019-06-04 DIAGNOSIS — G4733 Obstructive sleep apnea (adult) (pediatric): Secondary | ICD-10-CM | POA: Diagnosis not present

## 2019-06-04 DIAGNOSIS — I480 Paroxysmal atrial fibrillation: Secondary | ICD-10-CM | POA: Diagnosis not present

## 2019-06-04 DIAGNOSIS — I2510 Atherosclerotic heart disease of native coronary artery without angina pectoris: Secondary | ICD-10-CM | POA: Diagnosis not present

## 2019-06-04 DIAGNOSIS — Z7901 Long term (current) use of anticoagulants: Secondary | ICD-10-CM | POA: Diagnosis not present

## 2019-06-04 DIAGNOSIS — E669 Obesity, unspecified: Secondary | ICD-10-CM | POA: Diagnosis not present

## 2019-06-06 ENCOUNTER — Telehealth: Payer: Self-pay

## 2019-06-06 ENCOUNTER — Ambulatory Visit: Payer: Medicare Other

## 2019-06-06 DIAGNOSIS — E7849 Other hyperlipidemia: Secondary | ICD-10-CM | POA: Diagnosis not present

## 2019-06-06 NOTE — Telephone Encounter (Signed)
I reached out to the pt and advised of results.  Pt was agreeable to trying the bipap machine. She would like to change dme to [employer].   Pt understands to use machine every night for at least 4 hours and to f/u with [place] on 08/20/2019 at 1130.   Order has been sent to [employer] and letter has been mailed to the pt.

## 2019-06-06 NOTE — Telephone Encounter (Signed)
-----   Message from Star Age, MD sent at 06/03/2019  5:56 PM EST ----- Patient had a BiPAP titrations study on 05/20/19, last seen on 04/24/19, she was on autoBiPAP and had failed CPAP prior to that.  Please call and inform patient that I have entered an order for treatment with positive airway pressure (PAP) treatment for obstructive sleep apnea (OSA). She did better during the latest sleep study with BiPAP ST. We will change her setting from autoBiPAP to BiPAP of 20/15 cm with a back up rate of 16/min, she did fairly well with this setting during the sleep study. We will arrange for a machine for home use through her DME company. I will see the patient back in follow-up in about 2-3 months. Please also explain to the patient that I will be looking out for compliance data, which can be downloaded from the machine (stored on an SD card, that is inserted in the machine) or via remote access through a modem, that is built into the machine. At the time of the followup appointment we will discuss sleep study results and how it is going with PAP treatment at home. Please advise patient to bring Her machine at the time of the first FU visit, even though this is cumbersome. Bringing the machine for every visit after that will likely not be needed, but often helps for the first visit to troubleshoot if needed. Please re-enforce the importance of compliance with treatment and the need for [place] to monitor compliance data - often an insurance requirement and actually good feedback for the patient as far as how they are doing.  Also remind patient, that any interim PAP machine or mask issues should be first addressed with the DME company, as they can often help better with technical and mask fit issues. Please ask if patient has a preference regarding DME company.  Please also make sure, the patient has a follow-up appointment with me in about 10 weeks from the setup date, thanks. May see one of our nurse practitioners if  needed for proper timing of the FU appointment.  Please fax or rout report to the referring provider. Thanks,   Star Age, MD, PhD [facility])

## 2019-06-12 NOTE — Telephone Encounter (Signed)
Patient called back requesting a CB from RN in regards to bipap.

## 2019-06-12 NOTE — Telephone Encounter (Signed)
Called pt in regards to message.

## 2019-06-19 NOTE — Telephone Encounter (Signed)
Mallory Watts has spoke with the pt and the pt has decided NOT to change suppliers at this point. I have message Jonn Shingles with [employer] canceling the order and I have sent a message to the [employer] team for the new order.

## 2019-06-19 NOTE — Telephone Encounter (Signed)
Pt has provided return auto pap ticket to our office and [employer] has been contacted. This ticket should be viewable tomorrow in epic.  Hilda Blades has a copy of the pick up ticket. Note has been sent to [employer] stating the pt is using bibpap st through [employer] now.

## 2019-08-13 ENCOUNTER — Telehealth: Payer: Self-pay | Admitting: Neurology

## 2019-08-13 NOTE — Telephone Encounter (Signed)
Pt has called and cancelled her initial Bipap f/u appointment because she is still waiting for her correct mask.  Pt states she will call to r/s once she has her new mask

## 2019-08-14 NOTE — Telephone Encounter (Signed)
Noted  

## 2019-08-20 ENCOUNTER — Ambulatory Visit: Payer: Self-pay | Admitting: Neurology

## 2019-09-16 ENCOUNTER — Telehealth: Payer: Self-pay | Admitting: Neurology

## 2019-09-16 NOTE — Telephone Encounter (Signed)
Pt called and LVM stating she is needing to speak to the RN about getting back on the Bipap machine because she is not getting any answers from [employer]. Please advise.

## 2019-09-16 NOTE — Telephone Encounter (Signed)
I have reached out to [employer] about this message.  On 06/19/2019 pt decided not to go with [employer] but stay with [employer]. Community message was sent/received.  Waiting for [employer]'s response.

## 2019-09-16 NOTE — Telephone Encounter (Signed)
[employer] reached back out to me and advised pt was started on bipap in March. I called the pt and she sts she sts she called [employer] 3 weeks ago about an issue ( her machine would cut off before the air started blowing) but has not heard back.. I have reached out to [employer] about this and asked if they would call the pt.

## 2019-10-09 NOTE — Telephone Encounter (Signed)
Patient called stating she would like to speak to the nurse about her bipap machine.

## 2019-10-09 NOTE — Telephone Encounter (Signed)
I contacted the pt. She sts she is having difficulty with biapap. Feels like the pressure is too high. Pt is due for her initial f/u since starting the machine...  Pt will come in tomorrow at 330 to discuss with MD.

## 2019-10-10 ENCOUNTER — Encounter: Payer: Self-pay | Admitting: Neurology

## 2019-10-10 ENCOUNTER — Other Ambulatory Visit: Payer: Self-pay

## 2019-10-10 ENCOUNTER — Ambulatory Visit (INDEPENDENT_AMBULATORY_CARE_PROVIDER_SITE_OTHER): Payer: Medicare Other | Admitting: Neurology

## 2019-10-10 VITALS — BP 132/70 | HR 82 | Ht 63.0 in | Wt 174.3 lb

## 2019-10-10 DIAGNOSIS — G4731 Primary central sleep apnea: Secondary | ICD-10-CM | POA: Diagnosis not present

## 2019-10-10 DIAGNOSIS — G478 Other sleep disorders: Secondary | ICD-10-CM

## 2019-10-10 DIAGNOSIS — I2510 Atherosclerotic heart disease of native coronary artery without angina pectoris: Secondary | ICD-10-CM | POA: Diagnosis not present

## 2019-10-10 DIAGNOSIS — Z789 Other specified health status: Secondary | ICD-10-CM

## 2019-10-10 NOTE — Progress Notes (Signed)
Subjective:    Patient ID: Mallory Watts is a 79 y.o. female.  HPI     Interim history:   Mallory Watts is a 79 year old right-handed woman with an underlying medical history of coronary artery disease with status post CABG, atrial fibrillation, arthritis with status post left total knee replacement surgery, osteopenia, hypertension, reflux disease, breast cancer with status post left lumpectomy and chemo therapy and radiation, status post tonsillectomy, status post ACDF in October 2015, and borderline obesity, who presents for follow-up consultation of her complex sleep apnea, after a her BiPAP titration study. The patient is unaccompanied today.  I last saw her on 04/24/19, at which time Mallory Watts was on autoBiPAP at which time Mallory Watts was not fully compliant on auto BiPAP and did not feel any improvement, Mallory Watts had trouble tolerating it.  I suggested Mallory Watts return for a full night titration study with BiPAP therapy.  Mallory Watts had a BiPAP titration study on 05/20/19. BiPAP was initiated at 8/4 cmH20 and pressure was advanced to 12/8 cmH20 because of hypopneas, apneas and desaturations. Her AHI was 67.5/hour, with almost exclusively central events. Mallory Watts was, therefore switched to BIPAP ST of 13/9 cm at epoch 463 (01:58) with a rate of 16/min and pressure was advanced to 20/15 cmH20, at which point her AHI was 0/hour, with O2 nadir of 78%. Mallory Watts achieved only non-supine NREM sleep at the final titration pressure.  Today, 10/10/19: I reviewed her BiPAP ST compliance data from 09/11/2019 through 10/10/2019, which is a total of 30 days, during which time Mallory Watts used her machine 18 days with percent use days greater than 4 hours at 40%, indicating suboptimal compliance, average usage of 4 hours and 35 minutes for days on treatment, pressure of 20/15 with a backup rate of 16 breaths per minute, residual AHI elevated at 16.9, leak highly elevated consistently with a 95th percentile at 92.9 L/min.  Mallory Watts reports struggling with the pressure.  Mallory Watts  reports that it leaks also and Mallory Watts has mouth dryness, Mallory Watts feels the air blowing into her face and eyes and Mallory Watts is just not able to get to a point where Mallory Watts can consistently use it.  Mallory Watts is quite frustrated.  Mallory Watts does open her mouth at night, did not think Mallory Watts could tolerate a full facemask and ended up with a nasal mask.  We talked about her residual sleep apnea and the challenges we have faced thus far in treating her complex sleep apnea with a primary central apnea component.  Mallory Watts has an appointment tomorrow to return some supplies to her DME company and Mallory Watts would be willing to get in touch with them regarding her mask fit.  We also supplied her with a sample for a small F 30 full facemask.  Mallory Watts put it on during the appointment to try it out.  Mallory Watts did not bring her machine today and just by the fit of it it looked okay, but Mallory Watts is advised to readjust the headgear once Mallory Watts is able to connected to the machine and as the air blows Mallory Watts can get a feel for where it is leaking from.  Mallory Watts is willing to continue to try.  Mallory Watts is commended for her compliance thus far.  Mallory Watts has lost a good friend recently to suicide, Mallory Watts is still grappling with it, Mallory Watts is holding up okay, Mallory Watts has a good support system.   The patient's allergies, current medications, family history, past medical history, past social history, past surgical history and problem list were  reviewed and updated as appropriate.    Previously:   I saw her on 01/22/2019, at which time Mallory Watts was struggling with her CPAP, had trouble with her mask.  Mallory Watts had residual sleep disordered breathing with a central apnea index elevated around 8 and total AHI around 17/h for residual events.  Mallory Watts was advised to start BiPAP therapy at 19/15 for at least a month.  Her DME company started her on auto BiPAP therapy with maximum IPAP of 19 cm, minimum EPAP of 15 cm, 4 cm of pressure support.   I reviewed her auto BiPAP compliance data from 03/24/2019 through 04/22/2019 which is a  total of 30 days, during which time Mallory Watts used her machine 19 days with percent use days greater than 4 hours at 33%, average usage of 3 hours and 40 minutes for days on treatment, average AHI elevated at 21.3/h, which is worse from before, central apnea index at 10.8/h, leak high with a 95th percentile at 50.6 L/min, 95th percentile of IPAP pressure at 18.8 cm, 95th percentile of EPAP pressure at 14.9 cm.      I first met her on 09/04/2018 at the request of her primary care physician, at which time Mallory Watts reported daytime tiredness, family history of sleep apnea and nonrestorative sleep.  Mallory Watts was advised to proceed with sleep study.  Mallory Watts had a baseline sleep study on 10/11/2018, followed by a CPAP titration study on 10/30/2018.  I went over her test results with her in detail today.  Baseline sleep study from 10/11/2018 showed a sleep latency to persistent sleep of 37.5 minutes which is delayed, REM latency was 93.5%.  Mallory Watts had an increased percentage of night states sleep and a reduced percentage of REM sleep.  Total AHI was in the severe range at 58.7/h, O2 nadir was 80%.  Mallory Watts had no significant PLM's, EKG or EEG changes.  Mallory Watts was advised to return for a full night titration study.  Mallory Watts had a CPAP titration on 10/30/2018 which showed a sleep efficiency of 83.2%, sleep latency 20.5 minutes, REM latency delayed at 171 minutes.  Mallory Watts was titrated on CPAP via full facemask from 5 cm to 13 cm.  Mallory Watts was exhibiting central apneas at a higher pressure after 9 cm and was changed to standard BiPAP therapy.  Mallory Watts was titrated on BiPAP from 14/10 centimeters to a final pressure of 19/15.  Overall, Mallory Watts did reasonably well on CPAP of 13 cm.  Mallory Watts was advised to proceed with treatment at home with CPAP.  Her set up date was 11/09/2018.  Mallory Watts called in early August complaining that the pressure was too high.  The pressure was reduced to 11 cm.   I reviewed her CPAP compliance data from 12/22/2018 through 01/20/2019 which is a total of 30  days, during which time Mallory Watts used her CPAP 28 days with percent use days greater than 4 hours at 90%, indicating excellent compliance with an average usage of 5 hours and 34 minutes, residual AHI elevated at 17.2/h, with a central apnea index of 8.2/h, leak acceptable with a 95th percentile at 9.1 L/min on a pressure of 11 cm with EPR of 3.   09/04/2018: (Mallory Watts) reports nonrestorative sleep and feeling tired during the day.  Mallory Watts reports a family history of obstructive sleep apnea in 2 brothers. I reviewed your office note from 06/05/2018, which you kindly included. Her Epworth sleepiness score is 8 out of 24, fatigue severity score is 55 out of 63.  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  Mallory Watts has 2 cats in the household, sometimes in her bedroom, Mallory Watts does not watch TV in her bedroom.  Mallory Watts is not sure if Mallory Watts snores, Mariea Clonts is hard of hearing.  Bedtime is between 1030 and 11 and rise time between 630 and 730.  Mallory Watts has nocturia at least twice per average night and has woken up with a headache.  Mallory Watts denies telltale symptoms of restless leg syndrome.  Mallory Watts has gained weight over time.  Mallory Watts quit smoking in 1970, drinks alcohol occasionally in the form of wine, does not indulge in caffeine typically.  Her Past Medical History Is Significant For: Past Medical History:  Diagnosis Date  . Arthritis   . Atrial fibrillation ([place])    not symptomatic -on metoprolol BID for this   . CAD (coronary artery disease)    s/p CAGB 2004 at [facility]  . Cancer ([place]) 1998   breast cancer--left  . Dysrhythmia    atrial flutter/fib  . GERD (gastroesophageal reflux disease)   . Hypertension   . Osteopenia   . Personal history of chemotherapy 1998  . Personal history of radiation therapy 1998  . PONV (postoperative nausea and vomiting)   . Shortness of breath    exertion  . STD (sexually transmitted disease)    HSV  . Urinary  incontinence     Her Past Surgical History Is Significant For: Past Surgical History:  Procedure Laterality Date  . ANTERIOR CERVICAL DECOMP/DISCECTOMY FUSION N/A 01/14/2014   Procedure: ANTERIOR CERVICAL DECOMPRESSION/DISCECTOMY FUSION CERVICAL FOUR-FIVE,CERVICAL FIVE SIX;  Surgeon: [name], MD;  Location: [facility] [place] [facility];  Service: Neurosurgery;  Laterality: N/A;  . BREAST EXCISIONAL BIOPSY Left   . BREAST LUMPECTOMY Left 1998  . BREAST SURGERY  1998   left lumpectomy for breast ca  . COLONOSCOPY    . CORONARY ARTERY BYPASS GRAFT  2004   at [place]  . DILATION AND CURETTAGE OF UTERUS  2006  . heart bypass  2003   -single  . POLYPECTOMY    . TONSILLECTOMY    . TOTAL KNEE ARTHROPLASTY Left 11/28/2016   Procedure: LEFT TOTAL KNEE ARTHROPLASTY;  Surgeon: [name], MD;  Location: [facility];  Service: Orthopedics;  Laterality: Left;  Canal block    Her Family History Is Significant For: Family History  Problem Relation Age of Onset  . Diabetes Mother        AODM  . Stroke Mother   . Diabetes Brother   . Stroke Father   . Colon cancer Neg Hx   . Colon polyps Neg Hx   . Esophageal cancer Neg Hx   . Rectal cancer Neg Hx   . Stomach cancer Neg Hx     Her Social History Is Significant For: Social History   Socioeconomic History  . Marital status: Soil scientist    Spouse name: Not on file  . Number of children: Not on file  . Years of education: Not on file  . Highest education level: Not on file  Occupational History  . Not on file  Tobacco Use  . Smoking status: Former Research scientist (life sciences)  . Smokeless tobacco: Never Used  . Tobacco comment: smoked in college   Vaping Use  . Vaping Use: Never used  Substance and Sexual Activity  . Alcohol use: Yes    Alcohol/week: 1.0 standard drink    Types: 1 Glasses of  wine per week    Comment: occasionally  . Drug use: No  . Sexual activity: Not Currently    Partners: Male    Birth control/protection: Post-menopausal  Other Topics  Concern  . Not on file  Social History Narrative  . Not on file   Social Determinants of Health   Financial Resource Strain:   . Difficulty of Paying Living Expenses:   Food Insecurity:   . Worried About Charity fundraiser in the Last Year:   . Arboriculturist in the Last Year:   Transportation Needs:   . Film/video editor (Medical):   Marland Kitchen Lack of Transportation (Non-Medical):   Physical Activity:   . Days of Exercise per Week:   . Minutes of Exercise per Session:   Stress:   . Feeling of Stress :   Social Connections:   . Frequency of Communication with Friends and Family:   . Frequency of Social Gatherings with Friends and Family:   . Attends Religious Services:   . Active Member of Clubs or Organizations:   . Attends Archivist Meetings:   Marland Kitchen Marital Status:     Her Allergies Are:  Allergies  Allergen Reactions  . Gadolinium Nausea Only and Other (See Comments)     Desc: PATIENT FELT DIZZY AND NAUSEOUS AFTER GAD INJECTION. NO VOMITING,NO HIVES, NO RASH., Onset Date: [id number]   . Sulfonamide Derivatives Hives  :   Her Current Medications Are:  Outpatient Encounter Medications as of 10/10/2019  Medication Sig  . acetaminophen (TYLENOL) 500 MG tablet Take 500 mg by mouth every 6 (six) hours as needed.  Marland Kitchen acetaminophen (TYLENOL) 650 MG CR tablet Take 1,300 mg by mouth 2 (two) times daily as needed for pain.  Marland Kitchen alendronate (FOSAMAX) 70 MG tablet Take 70 mg by mouth once a week. Take with a full glass of water on an empty stomach.  Marland Kitchen apixaban (ELIQUIS) 5 MG TABS tablet Take 5 mg by mouth 2 (two) times daily.  Marland Kitchen atorvastatin (LIPITOR) 80 MG tablet Take 1 tablet (80 mg total) by mouth daily.  . Calcium Carbonate Antacid (TUMS PO) Take 1 tablet by mouth daily.  Marland Kitchen ezetimibe (ZETIA) 10 MG tablet Take 1 tablet (10 mg total) by mouth daily.  . fexofenadine (ALLEGRA) 180 MG tablet Take 180 mg by mouth daily as needed.   . Magnesium 250 MG TABS Take 250 mg by mouth  daily as needed (LEG CRAMPS). Reported on 05/01/2015  . metoprolol succinate (TOPROL-XL) 50 MG 24 hr tablet TAKE 1 TABLET BY MOUTH TWICE A DAY  . mometasone (NASONEX) 50 MCG/ACT nasal spray Place 2 sprays into the nose daily.  . nitroGLYCERIN (NITROSTAT) 0.4 MG SL tablet Place 1 tablet (0.4 mg total) under the tongue every 5 (five) minutes as needed for chest pain. Reported on 05/01/2015  . omeprazole (PRILOSEC) 40 MG capsule Take 40 mg by mouth daily.  . potassium chloride SA (K-DUR,KLOR-CON) 20 MEQ tablet Take 20 mEq by mouth 2 (two) times daily.  . solifenacin (VESICARE) 5 MG tablet One daily as needed (Patient taking differently: Take 5 mg by mouth daily. One daily as needed)  . triamterene-hydrochlorothiazide (MAXZIDE-25) 37.5-25 MG tablet Take 1 tablet by mouth daily.  . valACYclovir (VALTREX) 500 MG tablet Take 1 tablet (500 mg total) by mouth daily. Take one tablet by mouth twice a day for 3 days as needed for an outbreak.   No facility-administered encounter medications on file as of 10/10/2019.  :  Review of Systems:  Out of a complete 14 point review of systems, all are reviewed and negative with the exception of these symptoms as listed below: Review of Systems  Neurological:       Here for f/u on Bipap.. Pt reports difficulty using machine.. Feels like pressure is too much.     Objective:  Neurological Exam  Physical Exam Physical Examination:   Vitals:   10/10/19 1529  BP: 132/70  Pulse: 82    General Examination: The patient is a very pleasant 79 y.o. female in no acute distress. Mallory Watts appears well-developed and well-nourished and well groomed.   HEENT:Normocephalic, atraumatic, pupils are equal, round and reactive to light, corrective eyeglasses in place, extraocular tracking is preserved, face is symmetric with normal facial animation, airway examination reveals moderate mouth dryness, otherwise mild airway crowding and overall stable findings, tongue protrudes centrally  in palate elevates symmetrically, significantlydeviated septum to the right.   Chest:Clear to auscultation without wheezing, rhonchi or crackles noted.  Heart:S1+S2+0, regular and normal without murmurs, rubs or gallops noted.   Abdomen:Soft, non-tender and non-distended with normal bowel sounds appreciated on auscultation.  Extremities:There istrace edema in the distal lower extremities bilaterally.   Skin: Warm and dry without trophic changes noted.  Musculoskeletal: exam reveals no obvious joint deformities, tenderness or joint swelling or erythema.   Neurologically:  Mental status: The patient is awake, alert and oriented in all 4 spheres.Herimmediate and remote memory, attention, language skills and fund of knowledge are appropriate. There is no evidence of aphasia, agnosia, apraxia or anomia. Speech is clear with normal prosody and enunciation. Thought process is linear. Mood is normaland affect is normal.  Cranial nerves II - XII are as described above under HEENT exam. Motor exam: Normal bulk, strength and tone is noted. There is no tremor. Fine motor skills and coordination: intact grossly.  Cerebellar testing: No dysmetria or intention tremor. There is no truncal or gait ataxia.  Sensory exam: intact to light touch in the upper and lower extremities.   Gait, station and balance: no issues.   Assessmentand Plan:  In summary,Mallory R Reeseis a very pleasant 89 year oldfemalewith an underlying medical history of coronary artery disease, status post CABG, atrial fibrillation, arthritis with status postlefttotal knee replacement surgery, osteopenia, hypertension, reflux disease, breast cancer with status post left lumpectomy and chemo therapy and radiation, status post tonsillectomy, status post ACDF in October 2015, and borderline obesity, whoPresents for follow-up consultation of her Complex sleep apnea which has a primary central component at this point.  Mallory Watts has been on BiPAP therapy which did not help, her residual AHI is actually was elevated.  Prior to that, Mallory Watts was on CPAP which also did not help, Mallory Watts came in for a titration study on 05/20/2019 and was titrated on BiPAP ST with some improvement noted in her central sleep apnea.  Mallory Watts is struggling with a high pressure, currently at 20/15 with a rate of 16.  I suggested we reduce the pressure for better tolerance to 18/13 with a rate of 14.  In addition, we gave her a sample of a small F 30 fullface mask to try.  Mallory Watts is encouraged to call or email with an update after about 2 weeks.  Mallory Watts is advised to keep her appointment tomorrow with her DME company to get the pressure adjusted and also the mask refitted.Mallory Watts is commended on her compliance and willing to continue to try.  Of note, her baseline sleep study from 10/11/2018 showed severe  sleep apnea. Mallory Watts had a fairly challenging titration study on 10/30/2018. Mallory Watts could not tolerate CPAP of 13 cm at home. Mallory Watts was then started on CPAP of 11 cm with residual sleep apnea noted, average AHI around 17/h,with central sleep disordered breathing.Mallory Watts has not quite benefited fully from treatment which is understandable as Mallory Watts has struggled with the mask but also her apnea scores are not optimal quite yet.We reviewed her compliance data today and reviewed her sleep study findings.    Mallory Watts is advised to follow-up in 3 months, sooner if needed and also get in touch with [place] in the interim after about 2 weeks of trying the new mask.  I answered all her questions today and Mallory Watts was in agreement. I spent 30 minutes in total face-to-face time and in reviewing records during pre-charting, more than 50% of which was spent in counseling and coordination of care, reviewing test results, reviewing medications and treatment regimen and/or in discussing or reviewing the diagnosis of CSA, OSA, the prognosis and treatment options. Pertinent laboratory and imaging test results that were  available during this visit with the patient were reviewed by me and considered in my medical decision making (see chart for details).

## 2019-10-10 NOTE — Patient Instructions (Signed)
As discussed, you may need a full facemask, please try the sample of the small F 30 full facemask we gave you today, call us or email [place] in about 2 weeks for an update as to how you are feeling.  In addition, we will try to reduce the BiPAP pressure to 18/13 centimeters with a rate of 14, down from 20/15 with a rate of 16.  I appreciate you trying BiPAP, hang in there, it may improve over time.  Ultimately, we want this treatment to work for you to where you tolerate the treatment and you actually feel better.  Please keep your appointment tomorrow with your DME company.  Please advise him of the pressure change, we will fax the order to them today.  Please bring your new mask and also the machine to see if this is the right size and fit for you.

## 2019-10-18 ENCOUNTER — Other Ambulatory Visit: Payer: Self-pay

## 2019-10-18 MED ORDER — APIXABAN 5 MG PO TABS: 5.0000 mg | ORAL_TABLET | Freq: Two times a day (BID) | ORAL | 5 refills | Status: DC

## 2019-10-18 NOTE — Telephone Encounter (Signed)
Prescription refill request for Eliquis received.  Last office visit: Mallory Watts 05/03/2019 Scr: 1.3, 11/28/2018 Age: 79 y.o. Weight: 79.1 kg   Prescription refill sent.

## 2019-10-25 ENCOUNTER — Other Ambulatory Visit: Payer: Self-pay | Admitting: Internal Medicine

## 2019-11-04 DIAGNOSIS — M25561 Pain in right knee: Secondary | ICD-10-CM | POA: Diagnosis not present

## 2019-11-05 DIAGNOSIS — H04123 Dry eye syndrome of bilateral lacrimal glands: Secondary | ICD-10-CM | POA: Diagnosis not present

## 2019-11-05 DIAGNOSIS — Z961 Presence of intraocular lens: Secondary | ICD-10-CM | POA: Diagnosis not present

## 2019-11-05 DIAGNOSIS — H26491 Other secondary cataract, right eye: Secondary | ICD-10-CM | POA: Diagnosis not present

## 2019-11-05 DIAGNOSIS — H35371 Puckering of macula, right eye: Secondary | ICD-10-CM | POA: Diagnosis not present

## 2019-12-02 DIAGNOSIS — M8580 Other specified disorders of bone density and structure, unspecified site: Secondary | ICD-10-CM | POA: Diagnosis not present

## 2019-12-02 DIAGNOSIS — M859 Disorder of bone density and structure, unspecified: Secondary | ICD-10-CM | POA: Diagnosis not present

## 2019-12-02 DIAGNOSIS — E785 Hyperlipidemia, unspecified: Secondary | ICD-10-CM | POA: Diagnosis not present

## 2019-12-02 DIAGNOSIS — R739 Hyperglycemia, unspecified: Secondary | ICD-10-CM | POA: Diagnosis not present

## 2019-12-09 DIAGNOSIS — I480 Paroxysmal atrial fibrillation: Secondary | ICD-10-CM | POA: Diagnosis not present

## 2019-12-09 DIAGNOSIS — R300 Dysuria: Secondary | ICD-10-CM | POA: Diagnosis not present

## 2019-12-09 DIAGNOSIS — Z0000 Encounter for general adult medical examination without abnormal findings: Secondary | ICD-10-CM | POA: Diagnosis not present

## 2019-12-09 DIAGNOSIS — I2510 Atherosclerotic heart disease of native coronary artery without angina pectoris: Secondary | ICD-10-CM | POA: Diagnosis not present

## 2019-12-09 DIAGNOSIS — E785 Hyperlipidemia, unspecified: Secondary | ICD-10-CM | POA: Diagnosis not present

## 2019-12-09 DIAGNOSIS — N1831 Chronic kidney disease, stage 3a: Secondary | ICD-10-CM | POA: Diagnosis not present

## 2019-12-09 DIAGNOSIS — N3281 Overactive bladder: Secondary | ICD-10-CM | POA: Diagnosis not present

## 2019-12-09 DIAGNOSIS — G4700 Insomnia, unspecified: Secondary | ICD-10-CM | POA: Diagnosis not present

## 2019-12-09 DIAGNOSIS — I351 Nonrheumatic aortic (valve) insufficiency: Secondary | ICD-10-CM | POA: Diagnosis not present

## 2019-12-09 DIAGNOSIS — E669 Obesity, unspecified: Secondary | ICD-10-CM | POA: Diagnosis not present

## 2019-12-09 DIAGNOSIS — I1310 Hypertensive heart and chronic kidney disease without heart failure, with stage 1 through stage 4 chronic kidney disease, or unspecified chronic kidney disease: Secondary | ICD-10-CM | POA: Diagnosis not present

## 2019-12-09 DIAGNOSIS — Z7901 Long term (current) use of anticoagulants: Secondary | ICD-10-CM | POA: Diagnosis not present

## 2019-12-09 DIAGNOSIS — Z853 Personal history of malignant neoplasm of breast: Secondary | ICD-10-CM | POA: Diagnosis not present

## 2019-12-10 DIAGNOSIS — Z1212 Encounter for screening for malignant neoplasm of rectum: Secondary | ICD-10-CM | POA: Diagnosis not present

## 2020-01-01 ENCOUNTER — Telehealth: Payer: Self-pay | Admitting: Internal Medicine

## 2020-01-01 MED ORDER — NITROGLYCERIN 0.4 MG SL SUBL: 0.4000 mg | SUBLINGUAL_TABLET | SUBLINGUAL | 2 refills | Status: DC | PRN

## 2020-01-01 NOTE — Telephone Encounter (Signed)
Pt's medication was sent to pt's pharmacy as requested. Confirmation received.  °

## 2020-01-01 NOTE — Telephone Encounter (Signed)
*  STAT* If patient is at the pharmacy, call can be transferred to refill team.   1. Which medications need to be refilled? (please list name of each medication and dose if known)  New prescription of Nitroglycerin  2. Which pharmacy/location (including street and city if local pharmacy) is medication to be sent to?[facility] [address] [place], [place]  3. Do they need a 30 day or 90 day supply? Pt have an appt in October with Dr Harrington Challenger

## 2020-01-02 ENCOUNTER — Encounter: Payer: Self-pay | Admitting: Neurology

## 2020-01-02 ENCOUNTER — Telehealth: Payer: Self-pay | Admitting: Neurology

## 2020-01-02 NOTE — Telephone Encounter (Signed)
My chart message has been sent advising of this since our telephone system is experiencing difficulties.

## 2020-01-02 NOTE — Telephone Encounter (Signed)
Please call patient back regarding her MyChart message from today.  I agree with her completely that it has been a tough journey and very frustrating as we have not been able to get to a point where her sleep apnea was under good control.  We have tried multiple approaches with different machines and she has had several sleep studies.  I wonder if she would be interested in a second opinion by seeing a pulmonary sleep specialist?  I would be happy to make a referral, I am not quite sure how else to help her from my end of things but would like for her to feel better too.  I can make a referral or she can also talk to her primary care physician about her sleep specialist referral in the pulmonary sleep specialty.

## 2020-01-06 NOTE — Telephone Encounter (Signed)
Pt responded to my chart message and states. Mallory Watts, I will think about it and I have an appointment soon with my cardiologist and will discuss all of this with her in October.  Many thanks. Sheldon Silvan

## 2020-01-13 ENCOUNTER — Ambulatory Visit: Payer: Medicare Other | Admitting: Neurology

## 2020-01-14 DIAGNOSIS — Z23 Encounter for immunization: Secondary | ICD-10-CM | POA: Diagnosis not present

## 2020-01-17 DIAGNOSIS — Z23 Encounter for immunization: Secondary | ICD-10-CM | POA: Diagnosis not present

## 2020-02-03 ENCOUNTER — Ambulatory Visit: Payer: Medicare Other | Admitting: Internal Medicine

## 2020-02-10 ENCOUNTER — Encounter: Payer: Self-pay | Admitting: Internal Medicine

## 2020-02-10 ENCOUNTER — Other Ambulatory Visit: Payer: Self-pay

## 2020-02-10 ENCOUNTER — Ambulatory Visit (INDEPENDENT_AMBULATORY_CARE_PROVIDER_SITE_OTHER): Payer: Medicare Other | Admitting: Internal Medicine

## 2020-02-10 VITALS — BP 130/68 | HR 75 | Ht 63.0 in | Wt 169.0 lb

## 2020-02-10 DIAGNOSIS — I2510 Atherosclerotic heart disease of native coronary artery without angina pectoris: Secondary | ICD-10-CM | POA: Diagnosis not present

## 2020-02-10 DIAGNOSIS — I351 Nonrheumatic aortic (valve) insufficiency: Secondary | ICD-10-CM | POA: Diagnosis not present

## 2020-02-10 NOTE — Patient Instructions (Addendum)
Medication Instructions:  No changes today *If you need a refill on your cardiac medications before your next appointment, please call your pharmacy*   Lab Work: none If you have labs (blood work) drawn today and your tests are completely normal, you will receive your results only by: Marland Kitchen MyChart Message (if you have MyChart) OR . A paper copy in the mail If you have any lab test that is abnormal or we need to change your treatment, we will call you to review the results.   Testing/Procedures:  DUE AUGUST 2022 Your physician has requested that you have an echocardiogram. Echocardiography is a painless test that uses sound waves to create images of your heart. It provides your doctor with information about the size and shape of your heart and how well your heart's chambers and valves are working. This procedure takes approximately one hour. There are no restrictions for this procedure.   Follow-Up: At [facility], you and your health needs are our priority.  As part of our continuing mission to provide you with exceptional heart care, we have created designated Provider Care Teams.  These Care Teams include your primary Cardiologist (physician) and Advanced Practice Providers (APPs -  Physician Assistants and Nurse Practitioners) who all work together to provide you with the care you need, when you need it.    Your next appointment:   9 month(s)  The format for your next appointment:   In Person  Provider:   You may see [name], MD or one of the following Advanced Practice Providers on your designated Care Team:    Richardson Dopp, PA-C  Robbie Lis, [place]    Other Instructions We will request most recent labs from your primary care provider.

## 2020-02-10 NOTE — Progress Notes (Signed)
Cardiology Office Note   Date:  02/10/2020   ID:  Mallory Watts, DOB 10/09/1940, MRN [id number]  PCP:  Mallory Baton, MD  Cardiologist:   Mallory Carnes, MD   F/U of CAD   History of Present Illness: Mallory Watts is a 79 y.o. female with a history of CAD  She is status post CABG in 2004.  Followed by Dr. Wynonia Watts at that time.  The patient was seen by Dr. Agustin Watts back in December 2019.  Echocardiogram done at that time showed LVEF was normal with left atrial enlargement and moderate aortic insufficiency.  The patient also had a monitor which showed paroxysmal atrial fib/atrial  She has been maintained on Xarelto   She was switched to Eliquis because of hematuria.  The pt also has a hx of central sleep apnea   She has been followed by neuro   I saw the pt in Jan 2021   Since then she has done ok from cardiac standpoint.   She deneis CP  Breathing is OK   No dizziness   No palpiataitons    She is active around house but does not walk regularly  Just rejoined [facility] Says she cannot tolerate sleep devices   Current Meds  Medication Sig  . acetaminophen (TYLENOL) 650 MG CR tablet Take 1,300 mg by mouth 2 (two) times daily as needed for pain.  Marland Kitchen alendronate (FOSAMAX) 70 MG tablet Take 70 mg by mouth once a week. Take with a full glass of water on an empty stomach.  Marland Kitchen apixaban (ELIQUIS) 5 MG TABS tablet Take 1 tablet (5 mg total) by mouth 2 (two) times daily.  Marland Kitchen atorvastatin (LIPITOR) 80 MG tablet TAKE 1 TABLET BY MOUTH EVERY DAY  . Calcium Carbonate Antacid (TUMS PO) Take 1 tablet by mouth daily.  Marland Kitchen ezetimibe (ZETIA) 10 MG tablet Take 1 tablet (10 mg total) by mouth daily.  . fexofenadine (ALLEGRA) 180 MG tablet Take 180 mg by mouth daily as needed.   . Magnesium 250 MG TABS Take 250 mg by mouth daily as needed (LEG CRAMPS). Reported on 05/01/2015  . metoprolol succinate (TOPROL-XL) 50 MG 24 hr tablet TAKE 1 TABLET BY MOUTH TWICE A DAY  . mometasone (NASONEX) 50 MCG/ACT nasal spray Place 2  sprays into the nose daily.  . nitroGLYCERIN (NITROSTAT) 0.4 MG SL tablet Place 1 tablet (0.4 mg total) under the tongue every 5 (five) minutes as needed for chest pain. Please keep upcoming appt in October with Dr. Harrington Challenger. Thank you  . omeprazole (PRILOSEC) 40 MG capsule Take 40 mg by mouth daily.  . potassium chloride SA (K-DUR,KLOR-CON) 20 MEQ tablet Take 20 mEq by mouth 2 (two) times daily.  . solifenacin (VESICARE) 5 MG tablet One daily as needed (Patient taking differently: Take 5 mg by mouth daily. One daily as needed)  . triamterene-hydrochlorothiazide (MAXZIDE-25) 37.5-25 MG tablet Take 1 tablet by mouth daily.  . valACYclovir (VALTREX) 500 MG tablet Take 1 tablet (500 mg total) by mouth daily. Take one tablet by mouth twice a day for 3 days as needed for an outbreak.     Allergies:   Gadolinium and Sulfonamide derivatives   Past Medical History:  Diagnosis Date  . Arthritis   . Atrial fibrillation ([place])    not symptomatic -on metoprolol BID for this   . CAD (coronary artery disease)    s/p CAGB 2004 at [facility]  . Cancer ([place]) 1998   breast cancer--left  . Dysrhythmia  atrial flutter/fib  . GERD (gastroesophageal reflux disease)   . Hypertension   . Osteopenia   . Personal history of chemotherapy 1998  . Personal history of radiation therapy 1998  . PONV (postoperative nausea and vomiting)   . Shortness of breath    exertion  . STD (sexually transmitted disease)    HSV  . Urinary incontinence     Past Surgical History:  Procedure Laterality Date  . ANTERIOR CERVICAL DECOMP/DISCECTOMY FUSION N/A 01/14/2014   Procedure: ANTERIOR CERVICAL DECOMPRESSION/DISCECTOMY FUSION CERVICAL FOUR-FIVE,CERVICAL FIVE SIX;  Surgeon: [name], MD;  Location: [place] [facility];  Service: Neurosurgery;  Laterality: N/A;  . BREAST EXCISIONAL BIOPSY Left   . BREAST LUMPECTOMY Left 1998  . BREAST SURGERY  1998   left lumpectomy for breast ca  . COLONOSCOPY    . CORONARY ARTERY BYPASS GRAFT   2004   at [place]  . DILATION AND CURETTAGE OF UTERUS  2006  . heart bypass  2003   -single  . POLYPECTOMY    . TONSILLECTOMY    . TOTAL KNEE ARTHROPLASTY Left 11/28/2016   Procedure: LEFT TOTAL KNEE ARTHROPLASTY;  Surgeon: [name], MD;  Location: [facility];  Service: Orthopedics;  Laterality: Left;  Canal block     Social History:  The patient  reports that she has quit smoking. She has never used smokeless tobacco. She reports current alcohol use of about 1.0 standard drink of alcohol per week. She reports that she does not use drugs.   Family History:  The patient's family history includes Diabetes in her brother and mother; Stroke in her father and mother.    ROS:  Please see the history of present illness. All other systems are reviewed and  Negative to the above problem except as noted.    PHYSICAL EXAM: VS:  BP 130/68   Pulse 75   Ht 5\' 3"  (1.6 m)   Wt 169 lb (76.7 kg)   LMP 04/11/1994   BMI 29.94 kg/m   GEN: Well nourished, well developed, in no acute distress  HEENT: normal  Neck: no JVD   No carotid bruits    Cardiac:Irreg irreg   No S2  I/VI systolic murmur  No diastolic murmurs  No LE  edema  Respiratory:  clear to auscultation bilaterally, GI: soft, nontender, nondistended, + BS  No hepatomegaly  MS: no deformity Moving all extremities   Skin: warm and dry, no rash Neuro:  Strength and sensation are intact Psych: euthymic mood, full affect   EKG:  EKG is ordered today.  Atrial fib.fkytter   75 bpm   Occasional PVC    Lipid Panel No results found for: CHOL, TRIG, HDL, CHOLHDL, VLDL, LDLCALC, LDLDIRECT    Wt Readings from Last 3 Encounters:  02/10/20 169 lb (76.7 kg)  10/10/19 174 lb 5 oz (79.1 kg)  05/03/19 180 lb 6.4 oz (81.8 kg)      ASSESSMENT AND PLAN:  1.  CAD.  Patient has remote CABG in 2004.  Last stress test with with Thurman Coyer   She remains asymptomatic    2.  Aortic insufficiency.  No diastolic murmurs on exam   Will set up echo when I  see her again    3 dyslipidemia we will get lipids from Dr. Virgina Jock  4 history of PAF. In afib today   She does not sense   Keep on Eliquis    5.  Sleep apneaPt does not tolerate CPAP   WIll review with  T Turner    F/U in Aug with echo   Get labs from [place]   Current medicines are reviewed at length with the patient today.  The patient does not have concerns regarding medicines.  Signed, Mallory Carnes, MD  02/10/2020 1:36 PM    [facility] [address], [place], [place]  [address] Phone: [phone]; Fax: [phone]

## 2020-02-25 DIAGNOSIS — G4700 Insomnia, unspecified: Secondary | ICD-10-CM | POA: Diagnosis not present

## 2020-02-25 DIAGNOSIS — B372 Candidiasis of skin and nail: Secondary | ICD-10-CM | POA: Diagnosis not present

## 2020-02-25 DIAGNOSIS — B029 Zoster without complications: Secondary | ICD-10-CM | POA: Diagnosis not present

## 2020-02-28 ENCOUNTER — Other Ambulatory Visit: Payer: Self-pay | Admitting: Internal Medicine

## 2020-03-04 ENCOUNTER — Other Ambulatory Visit: Payer: Self-pay | Admitting: Cardiology

## 2020-04-10 ENCOUNTER — Other Ambulatory Visit: Payer: Self-pay | Admitting: Internal Medicine

## 2020-05-04 ENCOUNTER — Other Ambulatory Visit: Payer: Self-pay | Admitting: Internal Medicine

## 2020-05-04 DIAGNOSIS — Z1231 Encounter for screening mammogram for malignant neoplasm of breast: Secondary | ICD-10-CM

## 2020-06-08 ENCOUNTER — Telehealth: Payer: Self-pay | Admitting: Neurology

## 2020-06-08 NOTE — Telephone Encounter (Signed)
I called the pt back and advised the d/c date we have on file was 01/01/21, per the pt's mychart message stating she decided to d/c machine

## 2020-06-08 NOTE — Telephone Encounter (Signed)
Pt. states that she's not using CPAP machine anymore & she wants the date of when she reported that she stopped using it. She states she is still being billed for it.

## 2020-06-17 ENCOUNTER — Ambulatory Visit
Admission: RE | Admit: 2020-06-17 | Discharge: 2020-06-17 | Disposition: A | Discharge status: Home / Self Care | Payer: Medicare Other | Source: Ambulatory Visit | Attending: Internal Medicine | Admitting: Internal Medicine

## 2020-06-17 ENCOUNTER — Other Ambulatory Visit: Payer: Self-pay

## 2020-06-17 DIAGNOSIS — Z1231 Encounter for screening mammogram for malignant neoplasm of breast: Secondary | ICD-10-CM

## 2020-07-11 NOTE — Telephone Encounter (Signed)
Mallory Watts, Pt I saw who has been seen by neuro for sleep apnea.  Sounds like she didn't tolerate CPAP  Sending for your thoughts   Do you want to see in an appt or should I send her back to neuro?

## 2020-07-14 ENCOUNTER — Telehealth: Payer: Self-pay | Admitting: *Deleted

## 2020-07-14 NOTE — Telephone Encounter (Signed)
SInce pt no longer wants to be seen at neurology, set her up to be seen by T Radford Pax

## 2020-07-14 NOTE — Telephone Encounter (Signed)
Spoke with Anderson Malta at [facility]. Calling for appt for mutual patient.  Patient was seen today by NP for possible retention of q-tip.  No q-tip was found, NP noted a non-tender lesion on left side of vaginal wall. No pain, vaginal d/c or bleeding.   OV scheduled for 4/7 at 0800 with Dr. Quincy Simmonds. Anderson Malta will notify patient of appointment and arrival time of 19.  Anderson Malta will fax OV notes to [place], ATTN: Lawson Radar.  Advised I will send to Dr. Quincy Simmonds to review and contact the patient if any additional recommendations. Anderson Malta will advise patient to contact our office if any change in symptoms.   Last OV 01/24/18  Routing to provider for final review. Patient is agreeable to disposition. Will close encounter.

## 2020-07-15 NOTE — Progress Notes (Signed)
GYNECOLOGY  VISIT   HPI: 80 y.o.   Domestic Partner  Caucasian  female   G0P0 with Patient's last menstrual period was 04/11/1994.   here for vaginal lesion.  Returning patient of mine.   Seen by PCP to check for a potential foreign body of the vagina.  Here to check for potential vaginal lesion noted at the time of the exam.    Denies pain and discomfort.   No change in partner.  Not having intercourse for a long time per patient.  No change in partner.  Family living in [place] outside of [place].  GYNECOLOGIC HISTORY: Patient's last menstrual period was 04/11/1994. Contraception:  PMP Menopausal hormone therapy: none  mammogram: 06-17-20 3D/Neg/Birads1 Last pap smear:  07-21-14 Neg, 02-18-11 Neg        OB History    Gravida  0   Para      Term      Preterm      AB      Living        SAB      IAB      Ectopic      Multiple      Live Births                 Patient Active Problem List   Diagnosis Date Noted  . Status post coronary artery bypass graft 03/30/2018  . Coronary artery disease 03/30/2018  . Paroxysmal atrial flutter ([place]) 03/30/2018  . Dyslipidemia 03/30/2018  . OA (osteoarthritis) of knee 11/28/2016  . Cervical spondylosis with myelopathy and radiculopathy 01/14/2014  . RECTAL BLEEDING 01/07/2009  . PERSONAL HX COLONIC POLYPS 01/07/2009  . GASTROESOPHAGEAL REFLUX DISEASE, HX OF 08/01/2007  . ESOPHAGEAL STRICTURE 05/19/2005  . ESOPHAGEAL REFLUX 05/19/2005  . GASTRITIS, ACUTE 05/19/2005  . ADENOMATOUS COLONIC POLYP 05/09/2005    Past Medical History:  Diagnosis Date  . Arthritis   . Atrial fibrillation ([place])    not symptomatic -on metoprolol BID for this   . CAD (coronary artery disease)    s/p CAGB 2004 at [facility]  . Cancer ([place]) 1998   breast cancer--left  . Dysrhythmia    atrial flutter/fib  . GERD (gastroesophageal reflux disease)   . Hypertension   . Osteopenia   . Personal history of chemotherapy 1998  . Personal history  of radiation therapy 1998  . PONV (postoperative nausea and vomiting)   . Shortness of breath    exertion  . STD (sexually transmitted disease)    HSV  . Urinary incontinence     Past Surgical History:  Procedure Laterality Date  . ANTERIOR CERVICAL DECOMP/DISCECTOMY FUSION N/A 01/14/2014   Procedure: ANTERIOR CERVICAL DECOMPRESSION/DISCECTOMY FUSION CERVICAL FOUR-FIVE,CERVICAL FIVE SIX;  Surgeon: [name], MD;  Location: [place] [facility];  Service: Neurosurgery;  Laterality: N/A;  . BREAST EXCISIONAL BIOPSY Left   . BREAST LUMPECTOMY Left 1998  . BREAST SURGERY  1998   left lumpectomy for breast ca  . COLONOSCOPY    . CORONARY ARTERY BYPASS GRAFT  2004   at [place]  . DILATION AND CURETTAGE OF UTERUS  2006  . heart bypass  2003   -single  . POLYPECTOMY    . TONSILLECTOMY    . TOTAL KNEE ARTHROPLASTY Left 11/28/2016   Procedure: LEFT TOTAL KNEE ARTHROPLASTY;  Surgeon: [name], MD;  Location: [facility];  Service: Orthopedics;  Laterality: Left;  Canal block    Current Outpatient Medications  Medication Sig Dispense Refill  . acetaminophen (  TYLENOL) 650 MG CR tablet Take 1,300 mg by mouth 2 (two) times daily as needed for pain.    Marland Kitchen alendronate (FOSAMAX) 70 MG tablet Take 70 mg by mouth once a week. Take with a full glass of water on an empty stomach.    Marland Kitchen apixaban (ELIQUIS) 5 MG TABS tablet Take 1 tablet (5 mg total) by mouth 2 (two) times daily. 60 tablet 5  . atorvastatin (LIPITOR) 80 MG tablet TAKE 1 TABLET BY MOUTH EVERY DAY 90 tablet 3  . Calcium Carbonate Antacid (TUMS PO) Take 1 tablet by mouth daily.    Marland Kitchen ezetimibe (ZETIA) 10 MG tablet TAKE 1 TABLET BY MOUTH EVERY DAY 90 tablet 3  . fexofenadine (ALLEGRA) 180 MG tablet Take 180 mg by mouth daily as needed.     . Magnesium 250 MG TABS Take 250 mg by mouth daily as needed (LEG CRAMPS). Reported on 05/01/2015    . metoprolol succinate (TOPROL-XL) 50 MG 24 hr tablet TAKE 1 TABLET BY MOUTH TWICE A DAY 180 tablet 3  .  mometasone (NASONEX) 50 MCG/ACT nasal spray Place 2 sprays into the nose daily.    . nitroGLYCERIN (NITROSTAT) 0.4 MG SL tablet Place 1 tablet (0.4 mg total) under the tongue every 5 (five) minutes as needed for chest pain. Please keep upcoming appt in October with Dr. Harrington Challenger. Thank you 25 tablet 2  . nystatin ointment (MYCOSTATIN) 1 application    . omeprazole (PRILOSEC) 40 MG capsule Take 40 mg by mouth daily.  3  . potassium chloride SA (K-DUR,KLOR-CON) 20 MEQ tablet Take 20 mEq by mouth 2 (two) times daily.    . solifenacin (VESICARE) 5 MG tablet One daily as needed (Patient taking differently: Take 5 mg by mouth daily. One daily as needed) 90 tablet 3  . triamterene-hydrochlorothiazide (MAXZIDE-25) 37.5-25 MG tablet Take 1 tablet by mouth daily.    . valACYclovir (VALTREX) 500 MG tablet Take 1 tablet (500 mg total) by mouth daily. Take one tablet by mouth twice a day for 3 days as needed for an outbreak. 120 tablet 3   No current facility-administered medications for this visit.     ALLERGIES: Atenolol, Gadolinium, and Sulfonamide derivatives  Family History  Problem Relation Age of Onset  . Diabetes Mother        AODM  . Stroke Mother   . Diabetes Brother   . Stroke Father   . Colon cancer Neg Hx   . Colon polyps Neg Hx   . Esophageal cancer Neg Hx   . Rectal cancer Neg Hx   . Stomach cancer Neg Hx     Social History   Socioeconomic History  . Marital status: Soil scientist    Spouse name: Not on file  . Number of children: Not on file  . Years of education: Not on file  . Highest education level: Not on file  Occupational History  . Not on file  Tobacco Use  . Smoking status: Former Research scientist (life sciences)  . Smokeless tobacco: Never Used  . Tobacco comment: smoked in college   Vaping Use  . Vaping Use: Never used  Substance and Sexual Activity  . Alcohol use: Yes    Alcohol/week: 1.0 standard drink    Types: 1 Glasses of wine per week    Comment: occasionally  . Drug use: No   . Sexual activity: Not Currently    Partners: Male    Birth control/protection: Post-menopausal  Other Topics Concern  . Not on file  Social History Narrative  . Not on file   Social Determinants of Health   Financial Resource Strain: Not on file  Food Insecurity: Not on file  Transportation Needs: Not on file  Physical Activity: Not on file  Stress: Not on file  Social Connections: Not on file  Intimate Partner Violence: Not on file    Review of Systems  All other systems reviewed and are negative.   PHYSICAL EXAMINATION:    BP (!) 148/80   Pulse 87   Ht 5\' 1"  (1.549 m)   Wt 172 lb 12.8 oz (78.4 kg)   LMP 04/11/1994   SpO2 98%   BMI 32.65 kg/m     General appearance: alert, cooperative and appears stated age   Pelvic: External genitalia:  Introitus with minor splitting of the tissue at 6:00. Nontender per patient.               Urethra:  normal appearing urethra with no masses, tenderness or lesions              Bartholins and Skenes: normal                 Vagina: normal appearing vagina with normal color and discharge, multiple 4 mm areas of erythema of the anterior vaginal cuff.              Cervix: no lesions                Bimanual Exam:  Uterus:  normal size, contour, position, consistency, mobility, non-tender              Adnexa: no mass, fullness, tenderness          Chaperone was present for exam.  ASSESSMENT  Vaginal lesions.  Cervical cancer screening.  Hx atrial fibrillation.  On Eliquis.  PLAN  Discussion of vaginal lesions and potential etiologies - infection, atrophy, abnormal cells.  Pap and reflex HR HPV.  Return for colposcopy and biopsy. Procedure and rationale explained.  Patient prefers to do this in May, 2022.  I am ok with this.

## 2020-07-15 NOTE — Telephone Encounter (Signed)
Message to scheduling so that pt can be scheduled with Dr. Radford Pax.

## 2020-07-16 ENCOUNTER — Encounter: Payer: Self-pay | Admitting: Obstetrics and Gynecology

## 2020-07-16 ENCOUNTER — Ambulatory Visit: Payer: Medicare Other | Admitting: Obstetrics and Gynecology

## 2020-07-16 ENCOUNTER — Other Ambulatory Visit: Payer: Self-pay | Admitting: *Deleted

## 2020-07-16 ENCOUNTER — Other Ambulatory Visit (HOSPITAL_COMMUNITY)
Admission: RE | Admit: 2020-07-16 | Discharge: 2020-07-16 | Disposition: A | Discharge status: Home / Self Care | Payer: Medicare Other | Source: Ambulatory Visit | Attending: Obstetrics and Gynecology | Admitting: Obstetrics and Gynecology

## 2020-07-16 ENCOUNTER — Other Ambulatory Visit: Payer: Self-pay

## 2020-07-16 VITALS — BP 148/80 | HR 87 | Ht 61.0 in | Wt 172.8 lb

## 2020-07-16 DIAGNOSIS — Z124 Encounter for screening for malignant neoplasm of cervix: Secondary | ICD-10-CM

## 2020-07-16 DIAGNOSIS — N898 Other specified noninflammatory disorders of vagina: Secondary | ICD-10-CM | POA: Diagnosis not present

## 2020-07-16 DIAGNOSIS — G4731 Primary central sleep apnea: Secondary | ICD-10-CM

## 2020-07-17 ENCOUNTER — Telehealth: Payer: Self-pay | Admitting: *Deleted

## 2020-07-17 NOTE — Telephone Encounter (Signed)
-----   Message from Rodman Key, RN sent at 07/15/2020  3:50 PM EDT ----- Regarding: new sleep patinet Hey, This pt was being followed in neuro a while back for sleep but has not wished to follow up there. Dr. Harrington Challenger is requesting an appointment with Dr. Radford Pax.  Can you call her to be scheduled?    Carly, do I need to put in a referral for this appointment?  Thank you!

## 2020-07-17 NOTE — Telephone Encounter (Addendum)
Patient wants to speak to you about the inspire device or any other alternative to cpap. She turned her cpap in this week. Should I just make her an appointment. She would like a virtual appointment.  Per dr Radford Pax , she just needs to be referred to Dr. Redmond Baseman - I do not need to see her  Patient says if a Wednesday or Thursday make a morning appointment not in the afternoon. Monday, Tuesday, Friday all day is ok. Message sent to [facility] to schedule.

## 2020-07-20 LAB — CYTOLOGY - PAP: Diagnosis: NEGATIVE

## 2020-07-23 ENCOUNTER — Other Ambulatory Visit: Payer: Self-pay | Admitting: *Deleted

## 2020-07-24 NOTE — Addendum Note (Signed)
Addended by: Rodman Key on: 07/24/2020 08:32 AM   Modules accepted: Orders

## 2020-07-24 NOTE — Progress Notes (Signed)
Per Dr. Radford Pax, pt is to be referred to Dr. Redmond Baseman.  He does not need to be seen by her first.  Order for referral to ENT, Dr. Redmond Baseman placed.

## 2020-07-29 NOTE — Telephone Encounter (Signed)
Rodman Key, RN  Freada Bergeron, [place]; Antonieta Iba, RN I placed a referral to Dr. Redmond Baseman

## 2020-08-17 ENCOUNTER — Encounter: Payer: Self-pay | Admitting: Obstetrics and Gynecology

## 2020-08-18 NOTE — Telephone Encounter (Signed)
I would recommend she have a driver for the appointment.  She can bring some headphones and music with her if she would like.   I do not anticipate this appointment to be a long visit.   Medications which cause relaxation can also increase risk of falls, so I prefer not to prescribe this kind of medication.  She takes a blood thinner.

## 2020-09-01 ENCOUNTER — Ambulatory Visit: Payer: Medicare Other | Admitting: Obstetrics and Gynecology

## 2020-09-01 ENCOUNTER — Telehealth: Payer: Self-pay | Admitting: *Deleted

## 2020-09-01 ENCOUNTER — Encounter: Payer: Self-pay | Admitting: Obstetrics and Gynecology

## 2020-09-01 ENCOUNTER — Other Ambulatory Visit: Payer: Self-pay

## 2020-09-01 VITALS — BP 140/68 | HR 76 | Ht 61.0 in | Wt 172.0 lb

## 2020-09-01 DIAGNOSIS — B3731 Acute candidiasis of vulva and vagina: Secondary | ICD-10-CM

## 2020-09-01 DIAGNOSIS — R350 Frequency of micturition: Secondary | ICD-10-CM

## 2020-09-01 DIAGNOSIS — B373 Candidiasis of vulva and vagina: Secondary | ICD-10-CM

## 2020-09-01 DIAGNOSIS — N898 Other specified noninflammatory disorders of vagina: Secondary | ICD-10-CM | POA: Diagnosis not present

## 2020-09-01 LAB — WET PREP FOR TRICH, YEAST, CLUE

## 2020-09-01 MED ORDER — TERCONAZOLE 0.8 % VA CREA: 1.0000 | TOPICAL_CREAM | Freq: Every day | VAGINAL | 0 refills | Status: DC

## 2020-09-01 NOTE — Telephone Encounter (Signed)
Patient called c/o frequent urination and burning with urination, asked what to do about this. Scheduled for C&B next week. I advised patient she can schedule OV for treatment this week. Transferred to appointments to schedule.

## 2020-09-01 NOTE — Patient Instructions (Addendum)
[email or web address]  Colposcopy  Colposcopy is a procedure to examine the lowest part of the uterus (cervix), the vagina, and the area around the vaginal opening (vulva) for abnormalities or signs of disease. This procedure is done using an instrument that makes objects appear larger and provides light. (colposcope). During the procedure, the health care provider may remove a tissue sample to be looked at later under a microscope (biopsy). A biopsy may be done if any unusual cells are found during the colposcopy. You may have a colposcopy if you have:  An abnormal Pap smear, also called a Pap test. This screening test is used to check for signs of cancer or infection of the vagina, cervix, and uterus.  An HPV (human papillomavirus) test and get a positive result for a type of HPV that puts you at high risk of cancer.  Certain conditions or symptoms, such as: ? A sore, or lesion, on your cervix. ? Genital warts on your vulva, vagina, or cervix. ? Pain during sex. ? Vaginal bleeding, especially after sex.  A growth on your cervix (cervical polyp) that needs to be removed. Let your health care provider know about:  Any allergies you have, including allergies to medicines, latex, or iodine.  All medicines you are taking, including vitamins, herbs, eye drops, creams, and over-the-counter medicines.  Any problems you or family members have had with anesthetic medicines.  Any blood disorders you have.  Any surgeries you have had.  Any medical conditions you have, such as pelvic inflammatory disease (PID) or endometrial disorder.  The pattern of your menstrual cycles and the form of birth control (contraception) you use, if any.  Your medical history, including any history of fainting often or of cervical treatment.  Whether you are pregnant or may be pregnant. What are the risks? Generally, this is a safe  procedure. However, problems may occur, including:  Infection. Symptoms of infection may include fever, bad-smelling vaginal discharge, or pelvic pain.  Allergic reactions to medicines.  Damage to nearby structures or organs.  Fainting. This is rare. What happens before the procedure? Eating and drinking restrictions  Follow instructions from your health care provider about eating or drinking restrictions.  You will likely need to eat a regular diet the day of the procedure and not skip any meals. Tests  You may have an exam or testing. A pregnancy test will be done the day of the procedure.  You may have a blood or urine sample taken. General instructions  Ask your health care provider about: ? Changing or stopping your regular medicines. This is especially important if you are taking diabetes medicines or blood thinners. ? Taking medicines such as aspirin and ibuprofen. These medicines can thin your blood. Do not take these medicines unless your health care provider tells you to take them. Your health care provider will likely tell you to avoid taking aspirin, or medicine that contains aspirin, for 7 days before the procedure. ? Taking over-the-counter medicines, vitamins, herbs, and supplements.  Tell your health care provider if you have your menstrual period now or will have it at the time of your procedure. A colposcopy is not normally done during your menstrual period.  If you use contraception, continue to use it before your procedure.  For 24 hours before the procedure: ? Do not use douche products or tampons. ? Do not use medicines, creams, or suppositories in the vagina. ? Do not have sex.  Ask your health care provider what steps will be  taken to prevent infection. What happens during the procedure?  You will lie down on your back, with your feet in foot rests (stirrups).  A tool called a speculum will be warmed and will have oil or gel put on it (will be  lubricated). The speculum will then be inserted into your vagina. This will be used to hold apart the walls of your vagina so your health care provider can see your cervix and the inside of your vagina.  A cotton swab will be used to place a small amount of a liquid (solution) on the areas to be examined. This solution makes it easier to see abnormal cells. You may feel a slight burning during this part.  The colposcope will be used to scan the cervix with a bright white light. The colposcope will be held near your vulva and will make your vulva, vagina, and cervix look bigger so they can be seen better.  If a biopsy is needed: ? You may be given a medicine to numb the area (local anesthetic). ? Surgical tools will be used to remove mucus and cells through your vagina. ? You may feel mild pain while the tissue sample is removed. ? Bleeding may occur. A solution may be used to stop the bleeding. ? If a biopsy is needed from the inside of the cervix, a different procedure called endocervical curettage (ECC) may be done. During this procedure, a curved tool called a curette will be used to scrape cells from your cervix or the top of your cervix (endocervix).  Any abnormalities that are found will be recorded. The procedure may vary among health care providers and hospitals. What happens after the procedure?  You will lie down and rest for a few minutes. You may be offered juice or cookies.  Your blood pressure, heart rate, breathing rate, and blood oxygen level will be monitored until you leave the hospital or clinic.  You may have some cramping in your abdomen. This should go away after a few minutes.  It is up to you to get the results of your procedure. Ask your health care provider, or the department that is doing the procedure, when your results will be ready. Summary  Colposcopy is a procedure to examine the lowest part of the uterus (cervix), the vagina, and the area around the vaginal  opening (vulva) for abnormalities or signs of disease.  A biopsy may be done as part of the procedure.  After the procedure, you will remain lying down and will rest for a few minutes.  You may have some cramping in your abdomen. This should go away after a few minutes. This information is not intended to replace advice given to you by your health care provider. Make sure you discuss any questions you have with your health care provider. Document Revised: 03/27/2019 Document Reviewed: 03/27/2019 Elsevier Patient Education  2021 [place].  Vaginal Yeast Infection, Adult  Vaginal yeast infection is a condition that causes vaginal discharge as well as soreness, swelling, and redness (inflammation) of the vagina. This is a common condition. Some women get this infection frequently. What are the causes? This condition is caused by a change in the normal balance of the yeast (candida) and bacteria that live in the vagina. This change causes an overgrowth of yeast, which causes the inflammation. What increases the risk? The condition is more likely to develop in women who:  Take antibiotic medicines.  Have diabetes.  Take birth control pills.  Are  pregnant.  Douche often.  Have a weak body defense system (immune system).  Have been taking steroid medicines for a long time.  Frequently wear tight clothing. What are the signs or symptoms? Symptoms of this condition include:  White, thick, creamy vaginal discharge.  Swelling, itching, redness, and irritation of the vagina. The lips of the vagina (vulva) may be affected as well.  Pain or a burning feeling while urinating.  Pain during sex. How is this diagnosed? This condition is diagnosed based on:  Your medical history.  A physical exam.  A pelvic exam. Your health care provider will examine a sample of your vaginal discharge under a microscope. Your health care provider may send this sample for testing to confirm the  diagnosis. How is this treated? This condition is treated with medicine. Medicines may be over-the-counter or prescription. You may be told to use one or more of the following:  Medicine that is taken by mouth (orally).  Medicine that is applied as a cream (topically).  Medicine that is inserted directly into the vagina (suppository). Follow these instructions at home: Lifestyle  Do not have sex until your health care provider approves. Tell your sex partner that you have a yeast infection. That person should go to his or her health care provider and ask if they should also be treated.  Do not wear tight clothes, such as pantyhose or tight pants.  Wear breathable cotton underwear. General instructions  Take or apply over-the-counter and prescription medicines only as told by your health care provider.  Eat more yogurt. This may help to keep your yeast infection from returning.  Do not use tampons until your health care provider approves.  Try taking a sitz bath to help with discomfort. This is a warm water bath that is taken while you are sitting down. The water should only come up to your hips and should cover your buttocks. Do this 3-4 times per day or as told by your health care provider.  Do not douche.  If you have diabetes, keep your blood sugar levels under control.  Keep all follow-up visits as told by your health care provider. This is important.   Contact a health care provider if:  You have a fever.  Your symptoms go away and then return.  Your symptoms do not get better with treatment.  Your symptoms get worse.  You have new symptoms.  You develop blisters in or around your vagina.  You have blood coming from your vagina and it is not your menstrual period.  You develop pain in your abdomen. Summary  Vaginal yeast infection is a condition that causes discharge as well as soreness, swelling, and redness (inflammation) of the vagina.  This condition is  treated with medicine. Medicines may be over-the-counter or prescription.  Take or apply over-the-counter and prescription medicines only as told by your health care provider.  Do not douche. Do not have sex or use tampons until your health care provider approves.  Contact a health care provider if your symptoms do not get better with treatment or your symptoms go away and then return. This information is not intended to replace advice given to you by your health care provider. Make sure you discuss any questions you have with your health care provider. Document Revised: 10/26/2018 Document Reviewed: 08/14/2017 Elsevier Patient Education  [place].

## 2020-09-01 NOTE — Progress Notes (Signed)
GYNECOLOGY  VISIT   HPI: 80 y.o.   Domestic Partner  Caucasian  female   G0P0 with Patient's last menstrual period was 04/11/1994.   here for frequent urination and burning at the end of urination.   Was uncomfortable just sitting last night.   Voiding often and having stinging.  No blood in the urine.   No back pain.  No nausea or vomiting.  No fever.   Taking Valtrex for one week to prevent HSV.  Had some discomfort in her buttocks and so she initiated the Valtrex.   A little discharge in underwear.  Some odor.  No itching.  Just feels really dry.   Drinking acidic drinks.   Urinalysis:  sg 1.015, ph 5.5, 0 - 5 WBC, 0 - 2 RBC, few bacteria, few yeast.     Had her second booster vaccine for Covid last week.   GYNECOLOGIC HISTORY: Patient's last menstrual period was 04/11/1994. Contraception: PMP Menopausal hormone therapy:  none Last mammogram:  06-17-20 3D/Neg/Birads1 Last pap smear:  06-17-20 3D/Neg/Birads1        OB History    Gravida  0   Para      Term      Preterm      AB      Living        SAB      IAB      Ectopic      Multiple      Live Births                 Patient Active Problem List   Diagnosis Date Noted  . Status post coronary artery bypass graft 03/30/2018  . Coronary artery disease 03/30/2018  . Paroxysmal atrial flutter ([place]) 03/30/2018  . Dyslipidemia 03/30/2018  . OA (osteoarthritis) of knee 11/28/2016  . Cervical spondylosis with myelopathy and radiculopathy 01/14/2014  . RECTAL BLEEDING 01/07/2009  . PERSONAL HX COLONIC POLYPS 01/07/2009  . GASTROESOPHAGEAL REFLUX DISEASE, HX OF 08/01/2007  . ESOPHAGEAL STRICTURE 05/19/2005  . ESOPHAGEAL REFLUX 05/19/2005  . GASTRITIS, ACUTE 05/19/2005  . ADENOMATOUS COLONIC POLYP 05/09/2005    Past Medical History:  Diagnosis Date  . Arthritis   . Atrial fibrillation ([place])    not symptomatic -on metoprolol BID for this   . CAD (coronary artery disease)    s/p CAGB 2004 at  [facility]  . Cancer ([place]) 1998   breast cancer--left  . Dysrhythmia    atrial flutter/fib  . GERD (gastroesophageal reflux disease)   . Hypertension   . Osteopenia   . Personal history of chemotherapy 1998  . Personal history of radiation therapy 1998  . PONV (postoperative nausea and vomiting)   . Shortness of breath    exertion  . STD (sexually transmitted disease)    HSV  . Urinary incontinence     Past Surgical History:  Procedure Laterality Date  . ANTERIOR CERVICAL DECOMP/DISCECTOMY FUSION N/A 01/14/2014   Procedure: ANTERIOR CERVICAL DECOMPRESSION/DISCECTOMY FUSION CERVICAL FOUR-FIVE,CERVICAL FIVE SIX;  Surgeon: [name], MD;  Location: [facility];  Service: Neurosurgery;  Laterality: N/A;  . BREAST EXCISIONAL BIOPSY Left   . BREAST LUMPECTOMY Left 1998  . BREAST SURGERY  1998   left lumpectomy for breast ca  . COLONOSCOPY    . CORONARY ARTERY BYPASS GRAFT  2004   at [facility]  . DILATION AND CURETTAGE OF UTERUS  2006  . heart bypass  2003   -single  . POLYPECTOMY    . TONSILLECTOMY    .  TOTAL KNEE ARTHROPLASTY Left 11/28/2016   Procedure: LEFT TOTAL KNEE ARTHROPLASTY;  Surgeon: [name], MD;  Location: [facility] ORS;  Service: Orthopedics;  Laterality: Left;  Canal block    Current Outpatient Medications  Medication Sig Dispense Refill  . acetaminophen (TYLENOL) 650 MG CR tablet Take 1,300 mg by mouth 2 (two) times daily as needed for pain.    Marland Kitchen alendronate (FOSAMAX) 70 MG tablet Take 70 mg by mouth once a week. Take with a full glass of water on an empty stomach.    Marland Kitchen apixaban (ELIQUIS) 5 MG TABS tablet Take 1 tablet (5 mg total) by mouth 2 (two) times daily. 60 tablet 5  . atorvastatin (LIPITOR) 80 MG tablet TAKE 1 TABLET BY MOUTH EVERY DAY 90 tablet 3  . Calcium Carbonate Antacid (TUMS PO) Take 1 tablet by mouth daily.    Marland Kitchen ezetimibe (ZETIA) 10 MG tablet TAKE 1 TABLET BY MOUTH EVERY DAY 90 tablet 3  . fexofenadine (ALLEGRA) 180 MG tablet Take 180 mg by mouth daily as  needed.     . Magnesium 250 MG TABS Take 250 mg by mouth daily as needed (LEG CRAMPS). Reported on 05/01/2015    . meclizine (ANTIVERT) 12.5 MG tablet Take 12.5 mg by mouth 3 (three) times daily as needed for dizziness.    . metoprolol succinate (TOPROL-XL) 50 MG 24 hr tablet TAKE 1 TABLET BY MOUTH TWICE A DAY 180 tablet 3  . mometasone (NASONEX) 50 MCG/ACT nasal spray Place 2 sprays into the nose daily.    . nitroGLYCERIN (NITROSTAT) 0.4 MG SL tablet Place 1 tablet (0.4 mg total) under the tongue every 5 (five) minutes as needed for chest pain. Please keep upcoming appt in October with Dr. Harrington Challenger. Thank you 25 tablet 2  . nystatin ointment (MYCOSTATIN) 1 application    . omeprazole (PRILOSEC) 40 MG capsule Take 40 mg by mouth daily.  3  . potassium chloride SA (K-DUR,KLOR-CON) 20 MEQ tablet Take 20 mEq by mouth 2 (two) times daily.    . solifenacin (VESICARE) 5 MG tablet One daily as needed (Patient taking differently: Take 5 mg by mouth daily. One daily as needed) 90 tablet 3  . triamterene-hydrochlorothiazide (MAXZIDE-25) 37.5-25 MG tablet Take 1 tablet by mouth daily.    . valACYclovir (VALTREX) 500 MG tablet Take 1 tablet (500 mg total) by mouth daily. Take one tablet by mouth twice a day for 3 days as needed for an outbreak. 120 tablet 3   No current facility-administered medications for this visit.     ALLERGIES: Atenolol, Gadolinium, and Sulfonamide derivatives  Family History  Problem Relation Age of Onset  . Diabetes Mother        AODM  . Stroke Mother   . Diabetes Brother   . Stroke Father   . Colon cancer Neg Hx   . Colon polyps Neg Hx   . Esophageal cancer Neg Hx   . Rectal cancer Neg Hx   . Stomach cancer Neg Hx     Social History   Socioeconomic History  . Marital status: Soil scientist    Spouse name: Not on file  . Number of children: Not on file  . Years of education: Not on file  . Highest education level: Not on file  Occupational History  . Not on file   Tobacco Use  . Smoking status: Former Research scientist (life sciences)  . Smokeless tobacco: Never Used  . Tobacco comment: smoked in college   Vaping Use  . Vaping  Use: Never used  Substance and Sexual Activity  . Alcohol use: Yes    Alcohol/week: 1.0 standard drink    Types: 1 Glasses of wine per week    Comment: occasionally  . Drug use: No  . Sexual activity: Not Currently    Partners: Male    Birth control/protection: Post-menopausal  Other Topics Concern  . Not on file  Social History Narrative  . Not on file   Social Determinants of Health   Financial Resource Strain: Not on file  Food Insecurity: Not on file  Transportation Needs: Not on file  Physical Activity: Not on file  Stress: Not on file  Social Connections: Not on file  Intimate Partner Violence: Not on file    Review of Systems  Genitourinary: Positive for frequency.       Burning  All other systems reviewed and are negative.   PHYSICAL EXAMINATION:    BP 140/68   Pulse 76   Ht 5\' 1"  (1.549 m)   Wt 172 lb (78 kg)   LMP 04/11/1994   SpO2 98%   BMI 32.50 kg/m     General appearance: alert, cooperative and appears stated age  Pelvic: External genitalia:  no lesions              Urethra:  normal appearing urethra with no masses, tenderness or lesions              Bartholins and Skenes: normal                 Vagina: normal appearing vagina with normal color and discharge, multiple strawberry red lesions of the vagina.               Cervix: no lesions                Bimanual Exam:  Uterus:  normal size, contour, position, consistency, mobility, non-tender              Adnexa: no mass, fullness, tenderness             Chaperone was present for exam.  ASSESSMENT  Dysuria.  Vaginal odor.  Vaginal lesions. Hx HSV.  On Valtrex.  On Eliquis.   PLAN  Wet prep:  Positive for yeast and budding, negative for trichomonas and clue cells.  Terazol 3 cream per vagina.  UC sent.  No antibiotic at this time.  Return for  colposcopy and biopsy next week.   25 min  total time was spent for this patient encounter, including preparation, face-to-face counseling with the patient, coordination of care, and documentation of the encounter.

## 2020-09-03 LAB — URINALYSIS, COMPLETE W/RFL CULTURE
Bilirubin Urine: NEGATIVE
Glucose, UA: NEGATIVE
Hgb urine dipstick: NEGATIVE
Hyaline Cast: NONE SEEN /LPF
Ketones, ur: NEGATIVE
Leukocyte Esterase: NEGATIVE
Nitrites, Initial: NEGATIVE
Protein, ur: NEGATIVE
Specific Gravity, Urine: 1.015 (ref 1.001–1.035)
pH: 5.5 (ref 5.0–8.0)

## 2020-09-03 LAB — URINE CULTURE
MICRO NUMBER:: 11928055
Result:: NO GROWTH
SPECIMEN QUALITY:: ADEQUATE

## 2020-09-03 LAB — CULTURE INDICATED

## 2020-09-08 ENCOUNTER — Other Ambulatory Visit (HOSPITAL_COMMUNITY)
Admission: RE | Admit: 2020-09-08 | Discharge: 2020-09-08 | Disposition: A | Discharge status: Home / Self Care | Payer: Medicare Other | Source: Ambulatory Visit | Attending: Obstetrics and Gynecology | Admitting: Obstetrics and Gynecology

## 2020-09-08 ENCOUNTER — Ambulatory Visit: Payer: Medicare Other | Admitting: Obstetrics and Gynecology

## 2020-09-08 ENCOUNTER — Encounter: Payer: Self-pay | Admitting: Obstetrics and Gynecology

## 2020-09-08 ENCOUNTER — Other Ambulatory Visit: Payer: Self-pay

## 2020-09-08 VITALS — BP 170/98 | HR 73 | Ht 61.0 in | Wt 172.0 lb

## 2020-09-08 DIAGNOSIS — N898 Other specified noninflammatory disorders of vagina: Secondary | ICD-10-CM

## 2020-09-08 DIAGNOSIS — B3731 Acute candidiasis of vulva and vagina: Secondary | ICD-10-CM

## 2020-09-08 DIAGNOSIS — L439 Lichen planus, unspecified: Secondary | ICD-10-CM | POA: Diagnosis not present

## 2020-09-08 DIAGNOSIS — N952 Postmenopausal atrophic vaginitis: Secondary | ICD-10-CM | POA: Diagnosis not present

## 2020-09-08 DIAGNOSIS — L432 Lichenoid drug reaction: Secondary | ICD-10-CM

## 2020-09-08 DIAGNOSIS — B373 Candidiasis of vulva and vagina: Secondary | ICD-10-CM | POA: Diagnosis not present

## 2020-09-08 MED ORDER — TERCONAZOLE 0.8 % VA CREA
1.0000 | TOPICAL_CREAM | Freq: Every day | VAGINAL | 0 refills | Status: DC
Start: 2020-09-08 — End: 2021-08-31

## 2020-09-08 NOTE — Progress Notes (Signed)
  Subjective:     Patient ID: Mallory Watts, female   DOB: Mar 26, 1941, 80 y.o.   MRN: [id number]  HPI Patient here today for colposcopy for vaginal lesions.  Just treated with Terazol for vaginal yeast.  She is still having some symptoms. UC was negative.   Asking about using Petroleum jelly for vaginal dryness.   Dr. Virgina Jock treating yeast of flexural folds.   On Eliquis.   Review of Systems  All other systems reviewed and are negative.   LMP: PMP Pap 07/16/20 - normal.       Objective:   Physical Exam Genitourinary:      Colposcopy - cervix, vagina.  Consent for procedure.  3% acetic acid used in vagina. Findings:    Vagina:  Erythematous flat lesions of the vaginal cuff.  No other lesions  Noted.  Biopsies:   Vaginal cuff.  Monsel's placed.  Silver nitrate also placed.  Minimal EBL. No complications.      Assessment:     Vaginal cuff lesions.  Recent yeast vaginitis.  Symptoms persistent after using Terazol x 3 nights.  Vaginal atrophy.  On Eliquis.  Atrial fibrillation/flutter.  Hx left breast cancer 1998.     Plan:     FU biopsy result.  Post biopsy precautions given.  Ok to repeat a course of Terazol 3 after bleeding post procedure has stopped.  Options for atrophy tx - vaginal estrogen, cooking oils.  Given her medical history, cooking oil is favored.  She will place oil on a tampon and leave in the vagina for 1 - 2 hours.   20 min  total time was spent for this patient encounter, including preparation, face-to-face counseling with the patient, coordination of care, and documentation of the encounter in addition to performing the colposcopy with vaginal biopsy.

## 2020-09-08 NOTE — Patient Instructions (Signed)
Colposcopy, Care After This sheet gives you information about how to care for yourself after your procedure. Your doctor may also give you more specific instructions. If you have problems or questions, contact your doctor. What can I expect after the procedure? If you did not have a sample of your tissue taken out (did not have a biopsy), you may only have some spotting of blood for a few days. You can go back to your normal activities. If you had a sample of your tissue taken out, it is common to have:  Soreness and mild pain. These may last for a few days.  A light-headed feeling.  Mild bleeding or fluid (discharge) coming from your vagina. The fluid will look dark and grainy. You may have this for a few days. The fluid may be caused by a liquid that was used during your procedure. You may need to wear a sanitary pad.  Spotting of blood for at least 48 hours after the procedure. Follow these instructions at home: Medicines  Take over-the-counter and prescription medicines only as told by your doctor.  Ask your doctor what medicines you can start taking again. This is very important if you take blood thinners. Activity  Limit your activity for the first day after your procedure as told by your doctor.  For at least 3 days, or for as long as told by your doctor, avoid: ? Douching. ? Using tampons. ? Having sex.  Return to your normal activities as told by your doctor. Ask your doctor what activities are safe for you. General instructions  Drink enough fluid to keep your pee (urine) pale yellow.  Ask your doctor if you may take baths, swim, or use a hot tub. You may take showers.  If you use birth control (contraception), keep using it.  Keep all follow-up visits as told by your doctor. This is important.   Contact a doctor if:  You get a skin rash. Get help right away if:  You bleed a lot from your vagina. A lot of bleeding means you use more than one pad an hour for 2  hours in a row.  You have clumps of blood (blood clots) coming from your vagina.  You have a fever or chills.  You have signs of infection. This may be fluid coming from your vagina that is: ? Different than normal. ? Yellow. ? Bad-smelling.  You have very bad pain or cramps in your lower belly that do not get better with medicine.  You faint. Summary  If you did not have a sample of your tissue taken out, you may only have some spotting of blood for a few days. You can go back to your normal activities.  If you had a sample of your tissue taken out, it is common to have mild pain for a few days and spotting for 48 hours.  Avoid douching, using tampons, and having sex for at least 3 days after the procedure or for as long as told.  Get help right away if you have a lot of bleeding, very bad pain, or signs of infection. This information is not intended to replace advice given to you by your health care provider. Make sure you discuss any questions you have with your health care provider. Document Revised: 01/28/2020 Document Reviewed: 03/27/2019 Elsevier Patient Education  2021 [employer].

## 2020-09-10 ENCOUNTER — Encounter: Payer: Self-pay | Admitting: Obstetrics and Gynecology

## 2020-09-10 ENCOUNTER — Other Ambulatory Visit: Payer: Self-pay | Admitting: Obstetrics and Gynecology

## 2020-09-14 LAB — SURGICAL PATHOLOGY

## 2020-09-18 ENCOUNTER — Encounter: Payer: Self-pay | Admitting: Obstetrics and Gynecology

## 2020-11-16 ENCOUNTER — Other Ambulatory Visit: Payer: Self-pay

## 2020-11-16 ENCOUNTER — Ambulatory Visit (HOSPITAL_COMMUNITY): Payer: Medicare Other | Attending: Internal Medicine

## 2020-11-16 DIAGNOSIS — I351 Nonrheumatic aortic (valve) insufficiency: Secondary | ICD-10-CM | POA: Insufficient documentation

## 2020-11-16 DIAGNOSIS — I2510 Atherosclerotic heart disease of native coronary artery without angina pectoris: Secondary | ICD-10-CM | POA: Insufficient documentation

## 2020-11-16 LAB — ECHOCARDIOGRAM COMPLETE
Area-P 1/2: 4.86 cm2
P 1/2 time: 408 msec
S' Lateral: 3 cm

## 2020-11-20 ENCOUNTER — Encounter: Payer: Self-pay | Admitting: Obstetrics and Gynecology

## 2020-11-27 ENCOUNTER — Telehealth: Payer: Self-pay | Admitting: Cardiology

## 2020-11-27 NOTE — Telephone Encounter (Signed)
Pt is returning a call in regards to ECHO results

## 2020-11-27 NOTE — Telephone Encounter (Signed)
Fay Records, MD  11/16/2020 10:16 PM EDT     Pumping functoin of the heart is normal Aortic valve regurgitation appears mild, mild to moderate  Pt seen in clinic 1 year ago   ? When next appt is to review, examine    The patient has been notified of the result and verbalized understanding.  All questions (if any) were answered.  Scheduled the pt for overdue 1 year follow-up appt for 10/31 at 1:20 pm with Dr. Harrington Challenger.  Pt aware to arrive 15 mins prior to this appt.  Pt verbalized understanding and agrees with this plan.

## 2021-01-27 NOTE — Progress Notes (Signed)
Cardiology Office Note   Date:  01/28/2021   ID:  Mallory Watts, DOB 10/20/1940, MRN [id number]  PCP:  [name], MD  Cardiologist:   [name], MD   F/U of CAD   History of Present Illness: Mallory Watts is a 80 y.o. female with a history of CAD  She is status post CABG in 2004.  Followed by Dr. [name] at that time.  The patient was seen by Dr. [name] back in December 2019.  Echocardiogram done at that time showed LVEF was normal with left atrial enlargement and moderate aortic insufficiency.  The patient also had a monitor which showed paroxysmal atrial fib/atrial  She has been maintained on Xarelto   She was switched to Eliquis because of hematuria.  The pt also has a hx of central sleep apnea   She has been followed by neuro   I saw the patient in clinic in Nov 2021 IN Aug she had a repeat echo done that showed mild/midto mod AI  Since seen the pt denies CP   Breathing is OK   No palpitations      Current Meds  Medication Sig   acetaminophen (TYLENOL) 650 MG CR tablet Take 1,300 mg by mouth 2 (two) times daily as needed for pain.   alendronate (FOSAMAX) 70 MG tablet Take 70 mg by mouth once a week. Take with a full glass of water on an empty stomach.   apixaban (ELIQUIS) 5 MG TABS tablet Take 1 tablet (5 mg total) by mouth 2 (two) times daily.   atorvastatin (LIPITOR) 80 MG tablet TAKE 1 TABLET BY MOUTH EVERY DAY   Calcium Carbonate Antacid (TUMS PO) Take 1 tablet by mouth daily.   ezetimibe (ZETIA) 10 MG tablet TAKE 1 TABLET BY MOUTH EVERY DAY   fexofenadine (ALLEGRA) 180 MG tablet Take 180 mg by mouth daily as needed.    Magnesium 250 MG TABS Take 250 mg by mouth daily as needed (LEG CRAMPS). Reported on 05/01/2015   meclizine (ANTIVERT) 12.5 MG tablet Take 12.5 mg by mouth 3 (three) times daily as needed for dizziness.   metoprolol succinate (TOPROL-XL) 50 MG 24 hr tablet TAKE 1 TABLET BY MOUTH TWICE A DAY   mometasone (NASONEX) 50 MCG/ACT nasal spray Place 2  sprays into the nose daily.   nitroGLYCERIN (NITROSTAT) 0.4 MG SL tablet Place 1 tablet (0.4 mg total) under the tongue every 5 (five) minutes as needed for chest pain. Please keep upcoming appt in October with Dr. Harrington Challenger. Thank you   nystatin ointment (MYCOSTATIN) 1 application   omeprazole (PRILOSEC) 40 MG capsule Take 40 mg by mouth daily.   potassium chloride SA (K-DUR,KLOR-CON) 20 MEQ tablet Take 20 mEq by mouth 2 (two) times daily.   solifenacin (VESICARE) 5 MG tablet One daily as needed   terconazole (TERAZOL 3) 0.8 % vaginal cream Place 1 applicator vaginally at bedtime. Use for 3 nights in a row.   triamterene-hydrochlorothiazide (MAXZIDE-25) 37.5-25 MG tablet Take 1 tablet by mouth daily.   valACYclovir (VALTREX) 500 MG tablet Take 1 tablet (500 mg total) by mouth daily. Take one tablet by mouth twice a day for 3 days as needed for an outbreak.     Allergies:   Atenolol, Gadolinium, and Sulfonamide derivatives   Past Medical History:  Diagnosis Date   Arthritis    Atrial fibrillation (HCC)    not symptomatic -on metoprolol BID for this    CAD (coronary artery disease)  s/p CAGB 2004 at [facility]) 1998   breast cancer--left   Dysrhythmia    atrial flutter/fib   GERD (gastroesophageal reflux disease)    Hypertension    Osteopenia    Personal history of chemotherapy 1998   Personal history of radiation therapy 1998   PONV (postoperative nausea and vomiting)    Shortness of breath    exertion   STD (sexually transmitted disease)    HSV   Urinary incontinence     Past Surgical History:  Procedure Laterality Date   ANTERIOR CERVICAL DECOMP/DISCECTOMY FUSION N/A 01/14/2014   Procedure: ANTERIOR CERVICAL DECOMPRESSION/DISCECTOMY FUSION CERVICAL FOUR-FIVE,CERVICAL FIVE SIX;  Surgeon: [name], MD;  Location: [facility];  Service: Neurosurgery;  Laterality: N/A;   BREAST EXCISIONAL BIOPSY Left    BREAST LUMPECTOMY Left [place]   left  lumpectomy for breast ca   COLONOSCOPY     CORONARY ARTERY BYPASS GRAFT  2004   at [place]  2006   heart bypass  2003   -single   POLYPECTOMY     TONSILLECTOMY     TOTAL KNEE ARTHROPLASTY Left 11/28/2016   Procedure: LEFT TOTAL KNEE ARTHROPLASTY;  Surgeon: [name], MD;  Location: [facility];  Service: Orthopedics;  Laterality: Left;  Canal block     Social History:  The patient  reports that she has quit smoking. She has never used smokeless tobacco. She reports current alcohol use of about 1.0 standard drink per week. She reports that she does not use drugs.   Family History:  The patient's family history includes Diabetes in her brother and mother; Stroke in her father and mother.    ROS:  Please see the history of present illness. All other systems are reviewed and  Negative to the above problem except as noted.    PHYSICAL EXAM: VS:  BP (!) 144/78   Pulse 77   Ht 5\' 1"  (1.549 m)   Wt 169 lb 12.8 oz (77 kg)   LMP 04/11/1994   SpO2 95%   BMI 32.08 kg/m   GEN: Well nourished, well developed, in no acute distress  HEENT: normal  Neck: no JVD   No carotid bruits    Cardiac:Irreg irreg   No S2  I/VI systolic murmur  No LE edema  Respiratory:  clear to auscultation bilaterally, GI: soft, nontender, nondistended, + BS  No hepatomegaly  MS: no deformity Moving all extremities   Skin: warm and dry, no rash Neuro:  Strength and sensation are intact Psych: euthymic mood, full affect   EKG:  EKG is ordered today.  SR 77 bpm   Echo   AUg 2022  Left ventricular ejection fraction, by estimation, is 55 to 60%. Left ventricular ejection fraction by 3D volume is 56 %. The left ventricle has normal function. The left ventricle has no regional wall motion abnormalities. There is mild left ventricular hypertrophy. Left ventricular diastolic parameters are consistent with Grade III diastolic dysfunction (restrictive). Elevated left atrial pressure.  The average left ventricular global longitudinal strain is -17.4 %. 1. Right ventricular systolic function is normal. The right ventricular size is mildly enlarged. There is mildly elevated pulmonary artery systolic pressure. The estimated right ventricular systolic pressure is 85.2 mmHg. 2. The mitral valve is degenerative. Trivial mitral valve regurgitation. No evidence of mitral stenosis. Moderate mitral annular calcification. 3. 4. Tricuspid valve regurgitation is mild to moderate. The aortic valve  is tricuspid. Aortic valve regurgitation is mild to moderate. Mild to moderate aortic valve sclerosis/calcification is present, without any evidence of aortic stenosis. 5. The inferior vena cava is dilated in size with >50% respiratory variability, suggesting right atrial pressure of 8 mmHg. 6. Comparison(s): 04/20/18 EF 60-65%. PA pressure 57mmHg.  Lipid Panel No results found for: CHOL, TRIG, HDL, CHOLHDL, VLDL, LDLCALC, LDLDIRECT    Wt Readings from Last 3 Encounters:  01/28/21 169 lb 12.8 oz (77 kg)  09/08/20 172 lb (78 kg)  09/01/20 172 lb (78 kg)      ASSESSMENT AND PLAN:  1.  CAD.  Patient has remote CABG in 2004. Remote stress test   Pt denies symptoms   Follow   2.  Aortic insufficiency. Echo as noted above   No murmur on exam  FOllow with periodic echooes    3 dyslipidemia LDL is excellent at 60  COntinue meds   4 history of PAF.Pt in SR today    Keep on Eliquis   5.  Sleep apnea  Pt did not tolerate CPAP Current medicines are reviewed at length with the patient today.  The patient does not have concerns regarding medicines.  Signed, [name], MD  01/28/2021 10:58 PM    [address], [place], [place]  [address] Phone: [phone]; Fax: [phone]

## 2021-01-28 ENCOUNTER — Other Ambulatory Visit: Payer: Self-pay

## 2021-01-28 ENCOUNTER — Ambulatory Visit: Payer: Medicare Other | Admitting: Internal Medicine

## 2021-01-28 ENCOUNTER — Encounter: Payer: Self-pay | Admitting: Internal Medicine

## 2021-01-28 VITALS — BP 144/78 | HR 77 | Ht 61.0 in | Wt 169.8 lb

## 2021-01-28 DIAGNOSIS — I2510 Atherosclerotic heart disease of native coronary artery without angina pectoris: Secondary | ICD-10-CM

## 2021-01-28 NOTE — Patient Instructions (Signed)
Medication Instructions:  Your physician recommends that you continue on your current medications as directed. Please refer to the Current Medication list given to you today.  *If you need a refill on your cardiac medications before your next appointment, please call your pharmacy*   Lab Work: none If you have labs (blood work) drawn today and your tests are completely normal, you will receive your results only by: [place] (if you have MyChart) OR A paper copy in the mail If you have any lab test that is abnormal or we need to change your treatment, we will call you to review the results.   Testing/Procedures: none   Follow-Up: At [facility], you and your health needs are our priority.  As part of our continuing mission to provide you with exceptional heart care, we have created designated Provider Care Teams.  These Care Teams include your primary Cardiologist (physician) and Advanced Practice Providers (APPs -  Physician Assistants and Nurse Practitioners) who all work together to provide you with the care you need, when you need it.  We recommend signing up for the patient portal called "MyChart".  Sign up information is provided on this After Visit Summary.  MyChart is used to connect with patients for Virtual Visits (Telemedicine).  Patients are able to view lab/test results, encounter notes, upcoming appointments, etc.  Non-urgent messages can be sent to your provider as well.   To learn more about what you can do with MyChart, go to [email or web address].    Your next appointment:   10 month(s)  The format for your next appointment:   In Person  Provider:   [name], MD   Other Instructions  If your BP on top is staying over 140 please call Dr. Harrington Challenger

## 2021-02-08 ENCOUNTER — Ambulatory Visit: Payer: Medicare Other | Admitting: Internal Medicine

## 2021-02-23 ENCOUNTER — Other Ambulatory Visit: Payer: Self-pay | Admitting: Internal Medicine

## 2021-02-24 MED ORDER — DILTIAZEM HCL ER COATED BEADS 120 MG PO CP24: 120.0000 mg | ORAL_CAPSULE | Freq: Every day | ORAL | 3 refills | Status: DC

## 2021-02-24 NOTE — Telephone Encounter (Signed)
Patient wrote in about coming off of b blocker given hair loss   SHe could stop   Start low dose cardiazem CD 120 mg    FOllow BP closely   May need to increase

## 2021-03-06 ENCOUNTER — Other Ambulatory Visit: Payer: Self-pay | Admitting: Internal Medicine

## 2021-03-26 ENCOUNTER — Telehealth: Payer: Self-pay

## 2021-03-26 ENCOUNTER — Telehealth: Payer: Self-pay | Admitting: Hematology and Oncology

## 2021-03-26 NOTE — Telephone Encounter (Signed)
Pt called to confirm appointment, pt had no further questions.

## 2021-03-26 NOTE — Telephone Encounter (Signed)
Scheduled appt per 12/13 referral. Pt is aware of appt date and time.

## 2021-04-19 ENCOUNTER — Ambulatory Visit: Payer: Medicare Other | Admitting: Hematology and Oncology

## 2021-04-19 DIAGNOSIS — Z853 Personal history of malignant neoplasm of breast: Secondary | ICD-10-CM | POA: Insufficient documentation

## 2021-04-19 NOTE — Assessment & Plan Note (Deleted)
1833: Left breast IDC, T1 N1 M0 ER/PR positive HER2 negative, treated with lumpectomy, chemotherapy and radiation.  She took 5 years of tamoxifen followed by 3 months of anastrozole and treatment was discontinued  Current treatment: Surveillance Breast cancer surveillance: 1.  Breast exam 04/19/2021: Benign 2. mammograms annually 06/19/2020: Benign breast density category C

## 2021-04-22 ENCOUNTER — Telehealth: Payer: Self-pay | Admitting: Hematology and Oncology

## 2021-04-22 NOTE — Telephone Encounter (Signed)
Pt called in to r/s her missed new pt appt. R/s to next available. Pt is aware.

## 2021-05-03 NOTE — Progress Notes (Signed)
[facility] CONSULT NOTE  Patient Care Team: [name], MD as PCP - General (Internal Medicine) Fay Records, MD as PCP - Cardiology (Cardiology) Jacolyn Reedy, MD as Consulting Physician (Cardiology)  CHIEF COMPLAINTS/PURPOSE OF CONSULTATION:  Personal history of breast cancer  HISTORY OF PRESENTING ILLNESS:  Mallory Watts 81 y.o. female is here because of personal history of malignant neoplasm of the breast. She was diagnosed with invasive ductal breast cancer in 1998 at which time it was treated with lumpectomy, chemo, and XRT followed 5 years of tamoxifen. Screening mammogram on 06/17/2020 showed no findings suspicious for malignancy. She presents to the clinic today because she felt a discomfort in the left breast  I reviewed her records extensively and collaborated the history with the patient.  SUMMARY OF ONCOLOGIC HISTORY: Oncology History   No history exists.    MEDICAL HISTORY:  Past Medical History:  Diagnosis Date   Arthritis    Atrial fibrillation ([place])    not symptomatic -on metoprolol BID for this    CAD (coronary artery disease)    s/p CAGB 2004 at [facility]) 1998   breast cancer--left   Dysrhythmia    atrial flutter/fib   GERD (gastroesophageal reflux disease)    Hypertension    Osteopenia    Personal history of chemotherapy 1998   Personal history of radiation therapy 1998   PONV (postoperative nausea and vomiting)    Shortness of breath    exertion   STD (sexually transmitted disease)    HSV   Urinary incontinence     SURGICAL HISTORY: Past Surgical History:  Procedure Laterality Date   ANTERIOR CERVICAL DECOMP/DISCECTOMY FUSION N/A 01/14/2014   Procedure: ANTERIOR CERVICAL DECOMPRESSION/DISCECTOMY FUSION CERVICAL FOUR-FIVE,CERVICAL FIVE SIX;  Surgeon: [name], MD;  Location: [facility];  Service: Neurosurgery;  Laterality: N/A;   BREAST EXCISIONAL BIOPSY Left    BREAST LUMPECTOMY Left [place]   left lumpectomy for breast ca   COLONOSCOPY     CORONARY ARTERY BYPASS GRAFT  2004   at [place]  2006   heart bypass  2003   -single   POLYPECTOMY     TONSILLECTOMY     TOTAL KNEE ARTHROPLASTY Left 11/28/2016   Procedure: LEFT TOTAL KNEE ARTHROPLASTY;  Surgeon: [name], MD;  Location: [facility];  Service: Orthopedics;  Laterality: Left;  Canal block    SOCIAL HISTORY: Social History   Socioeconomic History   Marital status: Soil scientist    Spouse name: Not on file   Number of children: Not on file   Years of education: Not on file   Highest education level: Not on file  Occupational History   Not on file  Tobacco Use   Smoking status: Former   Smokeless tobacco: Never   Tobacco comments:    smoked in college   Vaping Use   Vaping Use: Never used  Substance and Sexual Activity   Alcohol use: Yes    Alcohol/week: 1.0 standard drink    Types: 1 Glasses of wine per week    Comment: occasionally   Drug use: No   Sexual activity: Not Currently    Partners: Male    Birth control/protection: Post-menopausal  Other Topics Concern   Not on file  Social History Narrative   Not on file   Social Determinants of Health   Financial Resource Strain: Not on file  Food Insecurity: Not on file  Transportation Needs: Not on file  Physical Activity: Not on file  Stress: Not on file  Social Connections: Not on file  Intimate Partner Violence: Not on file    FAMILY HISTORY: Family History  Problem Relation Age of Onset   Diabetes Mother        AODM   Stroke Mother    Diabetes Brother    Stroke Father    Colon cancer Neg Hx    Colon polyps Neg Hx    Esophageal cancer Neg Hx    Rectal cancer Neg Hx    Stomach cancer Neg Hx     ALLERGIES:  is allergic to atenolol, gadolinium, and sulfonamide derivatives.  MEDICATIONS:  Current Outpatient Medications  Medication Sig Dispense Refill   acetaminophen (TYLENOL) 650  MG CR tablet Take 1,300 mg by mouth 2 (two) times daily as needed for pain.     alendronate (FOSAMAX) 70 MG tablet Take 70 mg by mouth once a week. Take with a full glass of water on an empty stomach.     apixaban (ELIQUIS) 5 MG TABS tablet Take 1 tablet (5 mg total) by mouth 2 (two) times daily. 60 tablet 5   atorvastatin (LIPITOR) 80 MG tablet TAKE 1 TABLET BY MOUTH EVERY DAY 90 tablet 3   Calcium Carbonate Antacid (TUMS PO) Take 1 tablet by mouth daily.     diltiazem (CARDIZEM CD) 120 MG 24 hr capsule Take 1 capsule (120 mg total) by mouth daily. 90 capsule 3   ezetimibe (ZETIA) 10 MG tablet TAKE 1 TABLET BY MOUTH EVERY DAY 90 tablet 3   fexofenadine (ALLEGRA) 180 MG tablet Take 180 mg by mouth daily as needed.      Magnesium 250 MG TABS Take 250 mg by mouth daily as needed (LEG CRAMPS). Reported on 05/01/2015     meclizine (ANTIVERT) 12.5 MG tablet Take 12.5 mg by mouth 3 (three) times daily as needed for dizziness.     mometasone (NASONEX) 50 MCG/ACT nasal spray Place 2 sprays into the nose daily.     nitroGLYCERIN (NITROSTAT) 0.4 MG SL tablet PLACE 1 TABLET UNDER THE TONGUE EVERY 5 MINUTES AS NEEDED FOR CHEST PAIN. KEEP UPCOMING APPT 25 tablet 2   nystatin ointment (MYCOSTATIN) 1 application     omeprazole (PRILOSEC) 40 MG capsule Take 40 mg by mouth daily.  3   potassium chloride SA (K-DUR,KLOR-CON) 20 MEQ tablet Take 20 mEq by mouth 2 (two) times daily.     solifenacin (VESICARE) 5 MG tablet One daily as needed 90 tablet 3   terconazole (TERAZOL 3) 0.8 % vaginal cream Place 1 applicator vaginally at bedtime. Use for 3 nights in a row. 20 g 0   triamterene-hydrochlorothiazide (MAXZIDE-25) 37.5-25 MG tablet Take 1 tablet by mouth daily.     valACYclovir (VALTREX) 500 MG tablet Take 1 tablet (500 mg total) by mouth daily. Take one tablet by mouth twice a day for 3 days as needed for an outbreak. 120 tablet 3   No current facility-administered medications for this visit.    REVIEW OF  SYSTEMS:   Constitutional: Denies fevers, chills or abnormal night sweats Eyes: Denies blurriness of vision, double vision or watery eyes Ears, nose, mouth, throat, and face: Denies mucositis or sore throat Respiratory: Denies cough, dyspnea or wheezes Cardiovascular: Denies palpitation, chest discomfort or lower extremity swelling Gastrointestinal:  Denies nausea, heartburn or change in bowel habits Skin: Denies abnormal skin rashes Lymphatics: Denies new lymphadenopathy  or easy bruising Neurological:Denies numbness, tingling or new weaknesses Behavioral/Psych: Mood is stable, no new changes  Breast:  Denies any palpable lumps or discharge All other systems were reviewed with the patient and are negative.  PHYSICAL EXAMINATION: ECOG PERFORMANCE STATUS: 1 - Symptomatic but completely ambulatory  Vitals:   05/04/21 1532  BP: (!) 158/82  Pulse: 78  Resp: 18  Temp: 98.1 F (36.7 C)  SpO2: 99%   Filed Weights   05/04/21 1532  Weight: 175 lb 3.2 oz (79.5 kg)    GENERAL:alert, no distress and comfortable SKIN: skin color, texture, turgor are normal, no rashes or significant lesions EYES: normal, conjunctiva are pink and non-injected, sclera clear OROPHARYNX:no exudate, no erythema and lips, buccal mucosa, and tongue normal  NECK: supple, thyroid normal size, non-tender, without nodularity LYMPH:  no palpable lymphadenopathy in the cervical, axillary or inguinal LUNGS: clear to auscultation and percussion with normal breathing effort HEART: regular rate & rhythm and no murmurs and no lower extremity edema ABDOMEN:abdomen soft, non-tender and normal bowel sounds Musculoskeletal:no cyanosis of digits and no clubbing  PSYCH: alert & oriented x 3 with fluent speech NEURO: no focal motor/sensory deficits BREAST: No palpable nodules in breast. No palpable axillary or supraclavicular lymphadenopathy (exam performed in the presence of a chaperone)   LABORATORY DATA:  I have reviewed  the data as listed Lab Results  Component Value Date   WBC 8.6 10/19/2018   HGB 14.5 10/19/2018   HCT 44.1 10/19/2018   MCV 88 10/19/2018   PLT 240 10/19/2018   Lab Results  Component Value Date   NA 140 10/19/2018   K 3.9 10/19/2018   CL 102 10/19/2018   CO2 24 10/19/2018    RADIOGRAPHIC STUDIES: I have personally reviewed the radiological reports and agreed with the findings in the report.  ASSESSMENT AND PLAN:  History of left breast cancer 1998: Left breast cancer treated with lumpectomy, chemotherapy, radiation followed by tamoxifen for 5 years followed by anastrozole for 3 months (treated by Dr. Marko Plume)  Breast cancer surveillance: 1.  Breast exam 05/04/2021: 2. mammogram 06/19/2020: Benign breast density category C  Palpable concern in the left breast: There is no clinically identifiable palpable abnormality.  She is due for mammograms in March.  I recommended that she continue her routine surveillance mammograms in March. If she has any further issues with related to her breast she can come and be seen at anytime on an as-needed basis.   All questions were answered. The patient knows to call the clinic with any problems, questions or concerns.   Rulon Eisenmenger, MD, MPH 05/04/2021    I, Thana Ates, am acting as scribe for Nicholas Lose, MD.  I have reviewed the above documentation for accuracy and completeness, and I agree with the above.

## 2021-05-04 ENCOUNTER — Other Ambulatory Visit: Payer: Self-pay

## 2021-05-04 ENCOUNTER — Inpatient Hospital Stay: Payer: Medicare Other | Attending: Hematology and Oncology | Admitting: Hematology and Oncology

## 2021-05-04 DIAGNOSIS — K219 Gastro-esophageal reflux disease without esophagitis: Secondary | ICD-10-CM | POA: Insufficient documentation

## 2021-05-04 DIAGNOSIS — Z7901 Long term (current) use of anticoagulants: Secondary | ICD-10-CM | POA: Diagnosis not present

## 2021-05-04 DIAGNOSIS — M8580 Other specified disorders of bone density and structure, unspecified site: Secondary | ICD-10-CM | POA: Insufficient documentation

## 2021-05-04 DIAGNOSIS — Z79899 Other long term (current) drug therapy: Secondary | ICD-10-CM | POA: Insufficient documentation

## 2021-05-04 DIAGNOSIS — Z87891 Personal history of nicotine dependence: Secondary | ICD-10-CM | POA: Diagnosis not present

## 2021-05-04 DIAGNOSIS — Z853 Personal history of malignant neoplasm of breast: Secondary | ICD-10-CM

## 2021-05-04 DIAGNOSIS — Z9221 Personal history of antineoplastic chemotherapy: Secondary | ICD-10-CM | POA: Diagnosis not present

## 2021-05-04 DIAGNOSIS — I10 Essential (primary) hypertension: Secondary | ICD-10-CM | POA: Diagnosis not present

## 2021-05-04 DIAGNOSIS — Z79811 Long term (current) use of aromatase inhibitors: Secondary | ICD-10-CM | POA: Diagnosis not present

## 2021-05-04 DIAGNOSIS — I2510 Atherosclerotic heart disease of native coronary artery without angina pectoris: Secondary | ICD-10-CM | POA: Insufficient documentation

## 2021-05-04 DIAGNOSIS — C50912 Malignant neoplasm of unspecified site of left female breast: Secondary | ICD-10-CM | POA: Insufficient documentation

## 2021-05-04 DIAGNOSIS — I4891 Unspecified atrial fibrillation: Secondary | ICD-10-CM | POA: Diagnosis not present

## 2021-05-04 DIAGNOSIS — Z170 Estrogen receptor positive status [ER+]: Secondary | ICD-10-CM | POA: Diagnosis not present

## 2021-05-04 NOTE — Assessment & Plan Note (Signed)
1998: Left breast cancer treated with lumpectomy, chemotherapy, radiation followed by tamoxifen for 5 years followed by anastrozole for 3 months (treated by Dr. Marko Plume)  Breast cancer surveillance: 1.  Breast exam 05/04/2021: 2. mammogram 06/19/2020: Benign breast density category C

## 2021-05-17 ENCOUNTER — Encounter: Payer: Self-pay | Admitting: Internal Medicine

## 2021-05-17 ENCOUNTER — Other Ambulatory Visit: Payer: Self-pay | Admitting: Internal Medicine

## 2021-05-17 NOTE — Telephone Encounter (Signed)
Called and spoke to patient regarding her MyChart message on edema since beginning Diltiazem. Pt states that she has "no idea" if she has been taking her Eliquis in the last month. Pt confirms that she knows she has been taking Diltiazem, but due to chaos in her daily life, she has messed up taking her medications. Pt also states she has developed a lingering cough in the last month which se saw her PCP (Dr Virgina Jock) for and has had 2 CXR's and bloodwork done which she was told was re-assuring. Advised pt to refill her Eliquis and begin taking it today. Pt also states that she wants to discontinue the Diltiazem and go back on her prior Metoprolol RX. Pt cannot verify if she still has tablets on hand for Metoprolol, but states that she is going to [facility] to get help "getting her medications straightened out." Will forward message to Dr Harrington Challenger as Juluis Rainier and for any additional direction she would like to give to the patient.

## 2021-05-19 ENCOUNTER — Other Ambulatory Visit: Payer: Self-pay | Admitting: Hematology and Oncology

## 2021-05-19 DIAGNOSIS — Z1231 Encounter for screening mammogram for malignant neoplasm of breast: Secondary | ICD-10-CM

## 2021-05-19 NOTE — Telephone Encounter (Signed)
OK to approve metoprolol 50 bid

## 2021-05-25 MED ORDER — METOPROLOL TARTRATE 50 MG PO TABS: 50.0000 mg | ORAL_TABLET | Freq: Two times a day (BID) | ORAL | 3 refills | Status: DC

## 2021-06-07 ENCOUNTER — Telehealth: Payer: Self-pay | Admitting: Neurology

## 2021-06-07 NOTE — Telephone Encounter (Signed)
At 10:29 on 02-24 pt left  vm stating back in '21 Dr Rexene Alberts mentioned Referral to pulmonary specialist for sleep.  Pt would like to know if that could be done now or If she needs to schedule an appointment with Dr Rexene Alberts 1st, please call.

## 2021-06-07 NOTE — Telephone Encounter (Signed)
At 12:17 pt left a vm asking for a call back from [place], [place]

## 2021-06-07 NOTE — Telephone Encounter (Signed)
Called patient and LVM for patient  to call me back

## 2021-06-07 NOTE — Telephone Encounter (Signed)
I would recommend that patient request pulmonary sleep referral from PCP at this point. Please advise pt.

## 2021-06-07 NOTE — Telephone Encounter (Signed)
Spoke to patient  Gave patient Dr.Athars recommendation to call PCP for referral . Pt states she will call her primary care MD  for referral . Pt thanked me for calling

## 2021-06-15 DIAGNOSIS — R7989 Other specified abnormal findings of blood chemistry: Secondary | ICD-10-CM | POA: Diagnosis not present

## 2021-06-15 DIAGNOSIS — J811 Chronic pulmonary edema: Secondary | ICD-10-CM | POA: Diagnosis not present

## 2021-06-15 DIAGNOSIS — I2510 Atherosclerotic heart disease of native coronary artery without angina pectoris: Secondary | ICD-10-CM | POA: Diagnosis not present

## 2021-06-15 DIAGNOSIS — Z20822 Contact with and (suspected) exposure to covid-19: Secondary | ICD-10-CM | POA: Insufficient documentation

## 2021-06-15 DIAGNOSIS — Z951 Presence of aortocoronary bypass graft: Secondary | ICD-10-CM | POA: Diagnosis not present

## 2021-06-15 DIAGNOSIS — Z853 Personal history of malignant neoplasm of breast: Secondary | ICD-10-CM | POA: Insufficient documentation

## 2021-06-15 DIAGNOSIS — Z79899 Other long term (current) drug therapy: Secondary | ICD-10-CM | POA: Insufficient documentation

## 2021-06-15 DIAGNOSIS — R944 Abnormal results of kidney function studies: Secondary | ICD-10-CM | POA: Insufficient documentation

## 2021-06-15 DIAGNOSIS — Z7901 Long term (current) use of anticoagulants: Secondary | ICD-10-CM | POA: Diagnosis not present

## 2021-06-15 DIAGNOSIS — R0602 Shortness of breath: Secondary | ICD-10-CM | POA: Diagnosis present

## 2021-06-15 DIAGNOSIS — R053 Chronic cough: Secondary | ICD-10-CM | POA: Diagnosis present

## 2021-06-16 ENCOUNTER — Emergency Department (HOSPITAL_COMMUNITY)
Admission: EM | Admit: 2021-06-16 | Discharge: 2021-06-16 | Disposition: A | Discharge status: Home / Self Care | Payer: Medicare Other | Attending: Emergency Medicine | Admitting: Emergency Medicine

## 2021-06-16 ENCOUNTER — Encounter (HOSPITAL_COMMUNITY): Payer: Self-pay | Admitting: Emergency Medicine

## 2021-06-16 ENCOUNTER — Other Ambulatory Visit: Payer: Self-pay

## 2021-06-16 ENCOUNTER — Emergency Department (HOSPITAL_COMMUNITY): Payer: Medicare Other

## 2021-06-16 DIAGNOSIS — R052 Subacute cough: Secondary | ICD-10-CM

## 2021-06-16 LAB — CBC WITH DIFFERENTIAL/PLATELET
Abs Immature Granulocytes: 0.03 10*3/uL (ref 0.00–0.07)
Basophils Absolute: 0 10*3/uL (ref 0.0–0.1)
Basophils Relative: 0 %
Eosinophils Absolute: 0.1 10*3/uL (ref 0.0–0.5)
Eosinophils Relative: 2 %
HCT: 41.9 % (ref 36.0–46.0)
Hemoglobin: 13.3 g/dL (ref 12.0–15.0)
Immature Granulocytes: 0 %
Lymphocytes Relative: 28 %
Lymphs Abs: 2.5 10*3/uL (ref 0.7–4.0)
MCH: 28.8 pg (ref 26.0–34.0)
MCHC: 31.7 g/dL (ref 30.0–36.0)
MCV: 90.7 fL (ref 80.0–100.0)
Monocytes Absolute: 1 10*3/uL (ref 0.1–1.0)
Monocytes Relative: 11 %
Neutro Abs: 5.3 10*3/uL (ref 1.7–7.7)
Neutrophils Relative %: 59 %
Platelets: 243 10*3/uL (ref 150–400)
RBC: 4.62 MIL/uL (ref 3.87–5.11)
RDW: 14.7 % (ref 11.5–15.5)
WBC: 9 10*3/uL (ref 4.0–10.5)
nRBC: 0 % (ref 0.0–0.2)

## 2021-06-16 LAB — COMPREHENSIVE METABOLIC PANEL
ALT: 28 U/L (ref 0–44)
AST: 26 U/L (ref 15–41)
Albumin: 3.7 g/dL (ref 3.5–5.0)
Alkaline Phosphatase: 63 U/L (ref 38–126)
Anion gap: 10 (ref 5–15)
BUN: 31 mg/dL — ABNORMAL HIGH (ref 8–23)
CO2: 27 mmol/L (ref 22–32)
Calcium: 8.9 mg/dL (ref 8.9–10.3)
Chloride: 101 mmol/L (ref 98–111)
Creatinine, Ser: 1.54 mg/dL — ABNORMAL HIGH (ref 0.44–1.00)
GFR, Estimated: 34 mL/min — ABNORMAL LOW (ref >60)
Glucose, Bld: 115 mg/dL — ABNORMAL HIGH (ref 70–99)
Potassium: 3.8 mmol/L (ref 3.5–5.1)
Sodium: 138 mmol/L (ref 135–145)
Total Bilirubin: 0.8 mg/dL (ref 0.3–1.2)
Total Protein: 6.5 g/dL (ref 6.5–8.1)

## 2021-06-16 LAB — RESP PANEL BY RT-PCR (FLU A&B, COVID) ARPGX2
Influenza A by PCR: NEGATIVE
Influenza B by PCR: NEGATIVE
SARS Coronavirus 2 by RT PCR: NEGATIVE

## 2021-06-16 LAB — BRAIN NATRIURETIC PEPTIDE: B Natriuretic Peptide: 197.6 pg/mL — ABNORMAL HIGH (ref 0.0–100.0)

## 2021-06-16 LAB — TROPONIN I (HIGH SENSITIVITY): Troponin I (High Sensitivity): 10 ng/L (ref <18)

## 2021-06-16 NOTE — ED Triage Notes (Signed)
Pt presents to ED with c/o cough that she cannot ghet rid of since last night. State she took some OTC medicine that may have gone down the wrong way and she has "been coughing from her feet all the way up". Has had cough x6 weeks, had CXR that revealed "fluid on her lungs", given furosemide for one week and it was resolved.  ?

## 2021-06-16 NOTE — ED Provider Notes (Signed)
[facility] EMERGENCY DEPARTMENT Provider Note   CSN: [id number] Arrival date & time: 06/15/21  2317     History  Chief Complaint  Patient presents with   Cough    Mallory Watts is a 81 y.o. female who presents with her husband at bedside with concern for chronic cough for the last 6 weeks, exacerbated last night when she swallowed some water "down the wrong way".  She denies any chest pain, shortness of breath, palpitations, increased lower extremity edema, or any other new symptoms.  She has been seen multiple times by her primary care doctor and urgent care for her chronic cough which is primarily dry but occasionally has sputum that is clear to pale yellow-tinged.  By both outpatient PCP and urgent care providers patient was prescribed furosemide which she reportedly finished 1 week of without any improvement in her cough. BLE at baseline, per patient.  I personally reviewed this patient's medical records.  She is history of CAD with CABG in 2004 as well as atrial fibrillation and mild aortic insufficiency.  She is anticoagulated on Eliquis and was previously on diltiazem though she states it made her legs swell so she stopped taking it.  She recently restarted metoprolol for rate control of her atrial fibrillation and endorses compliance with this medication.  LVEF in August 2022 was 55 to 60%.   In addition to the above listed history she has history of left-sided breast cancer, GERD.  Of note, patient is a poor historian and appears to have poor understanding of the roles of her medications.  HPI     Home Medications Prior to Admission medications   Medication Sig Start Date End Date Taking? Authorizing Provider  acetaminophen (TYLENOL) 650 MG CR tablet Take 1,300 mg by mouth 2 (two) times daily as needed for pain.    [provider]  alendronate (FOSAMAX) 70 MG tablet Take 70 mg by mouth once a week. Take with a full glass of water on an empty  stomach.    [provider]  apixaban (ELIQUIS) 5 MG TABS tablet Take 1 tablet (5 mg total) by mouth 2 (two) times daily. 10/18/19   Belva Crome, MD  atorvastatin (LIPITOR) 80 MG tablet TAKE 1 TABLET BY MOUTH EVERY DAY 03/09/21   Fay Records, MD  Calcium Carbonate Antacid (TUMS PO) Take 1 tablet by mouth daily.    [provider]  diltiazem (CARDIZEM CD) 120 MG 24 hr capsule Take 1 capsule (120 mg total) by mouth daily. 02/24/21   Fay Records, MD  ezetimibe (ZETIA) 10 MG tablet TAKE 1 TABLET BY MOUTH EVERY DAY 02/23/21   Fay Records, MD  fexofenadine (ALLEGRA) 180 MG tablet Take 180 mg by mouth daily as needed.     [provider]  Magnesium 250 MG TABS Take 250 mg by mouth daily as needed (LEG CRAMPS). Reported on 05/01/2015    [provider]  meclizine (ANTIVERT) 12.5 MG tablet Take 12.5 mg by mouth 3 (three) times daily as needed for dizziness.    [provider]  metoprolol tartrate (LOPRESSOR) 50 MG tablet Take 1 tablet (50 mg total) by mouth 2 (two) times daily. 05/25/21   Fay Records, MD  mometasone (NASONEX) 50 MCG/ACT nasal spray Place 2 sprays into the nose daily. 01/31/18   [provider]  nitroGLYCERIN (NITROSTAT) 0.4 MG SL tablet PLACE 1 TABLET UNDER THE TONGUE EVERY 5 MINUTES AS NEEDED FOR CHEST PAIN. KEEP  UPCOMING APPT 03/09/21   Fay Records, MD  nystatin ointment (MYCOSTATIN) 1 application    [provider]  omeprazole (PRILOSEC) 40 MG capsule Take 40 mg by mouth daily. 10/27/17   [provider]  potassium chloride SA (K-DUR,KLOR-CON) 20 MEQ tablet Take 20 mEq by mouth 2 (two) times daily.    [provider]  solifenacin (VESICARE) 5 MG tablet One daily as needed 07/21/14   Regina Eck, CNM  terconazole (TERAZOL 3) 0.8 % vaginal cream Place 1 applicator vaginally at bedtime. Use for 3 nights in a row. 09/08/20   Nunzio Cobbs, MD  triamterene-hydrochlorothiazide (MAXZIDE-25)  37.5-25 MG tablet Take 1 tablet by mouth daily. 09/11/16   [provider]  valACYclovir (VALTREX) 500 MG tablet Take 1 tablet (500 mg total) by mouth daily. Take one tablet by mouth twice a day for 3 days as needed for an outbreak. 01/24/18   Nunzio Cobbs, MD      Allergies    Atenolol, Gadolinium, and Sulfonamide derivatives    Review of Systems   Review of Systems  Constitutional: Negative.   HENT: Negative.    Respiratory:  Positive for cough. Negative for choking, chest tightness and shortness of breath.   Cardiovascular:  Positive for leg swelling. Negative for chest pain and palpitations.  Gastrointestinal: Negative.   Genitourinary: Negative.   Musculoskeletal: Negative.   Neurological: Negative.    Physical Exam Updated Vital Signs BP (!) 154/68    Pulse 69    Temp 97.7 F (36.5 C) (Oral)    Resp 14    Ht '5\' 2"'$  (1.575 m)    Wt 77.1 kg    LMP 04/11/1994    SpO2 98%    BMI 31.09 kg/m  Physical Exam Vitals and nursing note reviewed.  Constitutional:      General: She is not in acute distress.    Appearance: She is not ill-appearing or toxic-appearing.  HENT:     Head: Normocephalic and atraumatic.     Nose: Nose normal.     Mouth/Throat:     Mouth: Mucous membranes are moist.     Pharynx: Oropharynx is clear. Uvula midline. No oropharyngeal exudate or posterior oropharyngeal erythema.  Eyes:     General: Lids are normal. Vision grossly intact.        Right eye: No discharge.        Left eye: No discharge.     Extraocular Movements: Extraocular movements intact.     Conjunctiva/sclera: Conjunctivae normal.     Pupils: Pupils are equal, round, and reactive to light.  Neck:     Trachea: Trachea and phonation normal.  Cardiovascular:     Rate and Rhythm: Normal rate. Rhythm irregularly irregular.     Pulses: Normal pulses.     Heart sounds: Normal heart sounds. No murmur heard. Pulmonary:     Effort: Pulmonary effort is normal. No tachypnea,  bradypnea, accessory muscle usage, prolonged expiration or respiratory distress.     Breath sounds: Normal breath sounds. No wheezing or rales.  Chest:     Chest wall: No mass, lacerations, deformity, swelling, tenderness, crepitus or edema.  Abdominal:     General: Bowel sounds are normal. There is no distension.     Palpations: Abdomen is soft.     Tenderness: There is no abdominal tenderness. There is no right CVA tenderness, left CVA tenderness, guarding or rebound.  Musculoskeletal:  General: No deformity.     Cervical back: Normal range of motion and neck supple.     Right lower leg: 1+ Edema present.     Left lower leg: 1+ Edema present.  Lymphadenopathy:     Cervical: No cervical adenopathy.  Skin:    General: Skin is warm and dry.     Capillary Refill: Capillary refill takes less than 2 seconds.  Neurological:     Mental Status: She is alert. Mental status is at baseline.  Psychiatric:        Mood and Affect: Mood normal.    ED Results / Procedures / Treatments   Labs (all labs ordered are listed, but only abnormal results are displayed) Labs Reviewed  COMPREHENSIVE METABOLIC PANEL - Abnormal; Notable for the following components:      Result Value   Glucose, Bld 115 (*)    BUN 31 (*)    Creatinine, Ser 1.54 (*)    GFR, Estimated 34 (*)    All other components within normal limits  BRAIN NATRIURETIC PEPTIDE - Abnormal; Notable for the following components:   B Natriuretic Peptide 197.6 (*)    All other components within normal limits  RESP PANEL BY RT-PCR (FLU A&B, COVID) ARPGX2  CBC WITH DIFFERENTIAL/PLATELET  TROPONIN I (HIGH SENSITIVITY)  TROPONIN I (HIGH SENSITIVITY)    EKG EKG Interpretation  Date/Time:  Wednesday June 16 2021 06:50:19 EST Ventricular Rate:  72 PR Interval:    QRS Duration: 100 QT Interval:  472 QTC Calculation: 517 R Axis:   56 Text Interpretation: Atrial fibrillation Prolonged QT interval Otherwise no significant change  Confirmed by Deno Etienne [id number]) on 06/16/2021 6:59:29 AM  Radiology DG Chest 2 View  Result Date: 06/16/2021 CLINICAL DATA:  Coughing and shortness of breath. EXAM: CHEST - 2 VIEW COMPARISON:  PA Lat 01/23/2014. FINDINGS: There are intact sternotomy sutures, CABG changes, and multiple left axillary surgical clips. There is aortic atherosclerosis and tortuosity with stable mediastinum. The heart has mildly enlarged compared to prior study with development of mild central vascular prominence and with lower zonal interstitial consolidation most likely due to interstitial edema. There are trace pleural effusions. Remaining lungs are clear. No airspace infiltrate is seen. There is mild thoracic kyphosis and osteopenia. IMPRESSION: Findings likely due to mild CHF or fluid overload, including with mild interstitial edema in the lower lung zones and trace pleural effusions. Alternatively could be due to interstitial pneumonitis. Otherwise stable post CABG chest. Electronically Signed   By: Telford Nab M.D.   On: 06/16/2021 01:36    Procedures Procedures    Medications Ordered in ED Medications - No data to display  ED Course/ Medical Decision Making/ A&P                           Medical Decision Making 81 year old female with hx of CAD, Afib, AI who presents with persistent cough x 6 weeks.   VS normal on intake.  Cardiac exam with irregularly irregular rhythm with normal rate, pulmonary exam unremarkable.  Bilateral lower extremity edema that is 1+ without pitting.  Abdominal exam is benign.  Differential diagnose includes but is not limited to CHF exacerbation, pneumonia, pleural effusion, PE, ACS, dysrhythmia.  Clinically doubt PE given lack of recent travel or prolonged immobilization/surgery.  She is compliant with her Eliquis for the last month according to the patient though there was a period of time where she was confused and  not taking daily.  Without tachycardia, tachypnea, or hypoxia as  well as without shortness of breath clinically low suspicion for PE.  Amount and/or Complexity of Data Reviewed Labs: ordered.    Details: CBC without leukocytosis or anemia.  BMP with creatinine of 1.5, most recent creatinine 2 years ago in the system which was 1.2.  Troponin negative, 10.  BNP mildly elevated at 197. Radiology:     Details: Chest x-ray with mild interstitial edema questionable trace pleural effusions without acute opacity.  Images visualized by this provider and agree with read. ECG/medicine tests: ordered.    Details: Atrial fibrillation with prolonged QT without ST changes.   Clinical concern for emergent underlying etiology that warrant further ED work-up or inpatient management is exceedingly low.  Clinical picture without acute changes that would provide insight etiology for patient's chronic cough.  Pat voiced understanding of her medical evaluation and treatment plan.  Each of her questions answered to her expressed satisfaction.  Return precautions were given.  She has minimal plan follow-up with cardiology.  Patient is well-appearing, stable, and was discharged in good condition.  This chart was dictated using voice recognition software, Dragon. Despite the best efforts of this provider to proofread and correct errors, errors may still occur which can change documentation meaning.  Final Clinical Impression(s) / ED Diagnoses Final diagnoses:  Subacute cough    Rx / DC Orders ED Discharge Orders     None         Emeline Darling, PA-C 06/16/21 [place], August, DO 06/16/21 [phone]

## 2021-06-16 NOTE — ED Provider Triage Note (Signed)
Emergency Medicine Provider Triage Evaluation Note ? ?Mallory Watts , a 81 y.o. female  was evaluated in triage.  Pt complains of cough.  Reports that cough has been present over the last 6 weeks.  Cough became worse tonight after she took generic NyQuil.  Reports that cough is producing mucus mucus ranges in color from yellow to white.  Patient reports that she has had intermittent shortness of breath over the last 6 weeks.  Denies any shortness of breath at present. ? ?Denies any fever, chills, chest pain, lightheadedness, syncope, hemoptysis. ? ? ?Review of Systems  ?Positive: Cough, shortness of breath ?Negative: See above ? ?Physical Exam  ?BP 115/67 (BP Location: Right Arm)   Pulse 73   Temp 98.1 ?F (36.7 ?C) (Oral)   Resp 18   LMP 04/11/1994   SpO2 96%  ?Gen:   Awake, no distress   ?Resp:  Normal effort, clear to auscultation bilaterally ?MSK:   Moves extremities without difficulty  ?Other:  Patient noted to have frequent dry cough during examination. ? ?Medical Decision Making  ?Medically screening exam initiated at 12:52 AM.  Appropriate orders placed.  ADARIA HOLE was informed that the remainder of the evaluation will be completed by another provider, this initial triage assessment does not replace that evaluation, and the importance of remaining in the ED until their evaluation is complete. ? ? ?  ?Loni Beckwith, PA-C ?06/16/21 0053 ? ?

## 2021-06-16 NOTE — Discharge Instructions (Signed)
You were seen in the ER today for your cough. Your physical exam and and blood work were reassuring.  Please call your cardiologist today to schedule follow-up appointment in the next 1 to 2 weeks.  Please continue taking your prescribed medications daily.  Return to the ER with any new severe symptoms. ?

## 2021-06-18 ENCOUNTER — Ambulatory Visit
Admission: RE | Admit: 2021-06-18 | Discharge: 2021-06-18 | Disposition: A | Discharge status: Home / Self Care | Payer: Medicare Other | Source: Ambulatory Visit | Attending: Hematology and Oncology | Admitting: Hematology and Oncology

## 2021-06-18 DIAGNOSIS — Z1231 Encounter for screening mammogram for malignant neoplasm of breast: Secondary | ICD-10-CM

## 2021-06-20 NOTE — Progress Notes (Unsigned)
Cardiology Office Note   Date:  06/21/2021   ID:  Mallory Watts, DOB 1940/12/01, MRN [id number]  PCP:  Mallory Baton, MD  Cardiologist:   Mallory Carnes, MD   F/U of CAD   History of Present Illness: Mallory Watts is a 81 y.o. female with a history of CAD  She is status post CABG in 2004.  Followed by Mallory Watts at that time.  The patient was seen by Mallory Watts back in December 2019.  Echocardiogram done at that time showed LVEF was normal with left atrial enlargement and moderate aortic insufficiency.  The patient also had a monitor which showed paroxysmal atrial fib/atrial  She has been maintained on Xarelto   She was switched to Eliquis because of hematuria.  The pt also has a hx of central sleep apnea   She has been followed by neuro   I saw the patient in clinic in Nov 2021 IN Aug she had a repeat echo done that showed mild/midto mod AI  Since seen the pt denies CP   Breathing is OK   No palpitations    I saw the pt in Octo 2022  She was doing good at the time   Pt says that she took a liquid medicine   Went down the wrong way  Couldn't stop coughing   Weent down the wrong way      Went to 3/8  In Nov patient's significant other had renal failure   Pt said she was going crazy    Exhausted  Pt was very SOB at times    Sometimes wakes up very SOB    Current Meds  Medication Sig   acetaminophen (TYLENOL) 650 MG CR tablet Take 1,300 mg by mouth 2 (two) times daily as needed for pain.   alendronate (FOSAMAX) 70 MG tablet Take 70 mg by mouth once a week. Take with a full glass of water on an empty stomach.   apixaban (ELIQUIS) 5 MG TABS tablet Take 1 tablet (5 mg total) by mouth 2 (two) times daily.   atorvastatin (LIPITOR) 80 MG tablet TAKE 1 TABLET BY MOUTH EVERY DAY   Calcium Carbonate Antacid (TUMS PO) Take 1 tablet by mouth daily.   ezetimibe (ZETIA) 10 MG tablet TAKE 1 TABLET BY MOUTH EVERY DAY   fexofenadine (ALLEGRA) 180 MG tablet Take 180 mg by mouth daily as needed.     Magnesium 250 MG TABS Take 250 mg by mouth daily as needed (LEG CRAMPS). Reported on 05/01/2015   meclizine (ANTIVERT) 12.5 MG tablet Take 12.5 mg by mouth 3 (three) times daily as needed for dizziness.   metoprolol tartrate (LOPRESSOR) 50 MG tablet Take 1 tablet (50 mg total) by mouth 2 (two) times daily.   mometasone (NASONEX) 50 MCG/ACT nasal spray Place 2 sprays into the nose daily.   nitroGLYCERIN (NITROSTAT) 0.4 MG SL tablet PLACE 1 TABLET UNDER THE TONGUE EVERY 5 MINUTES AS NEEDED FOR CHEST PAIN. KEEP UPCOMING APPT   nystatin ointment (MYCOSTATIN) 1 application   omeprazole (PRILOSEC) 40 MG capsule Take 40 mg by mouth daily.   potassium chloride SA (K-DUR,KLOR-CON) 20 MEQ tablet Take 20 mEq by mouth 2 (two) times daily.   solifenacin (VESICARE) 5 MG tablet One daily as needed   terconazole (TERAZOL 3) 0.8 % vaginal cream Place 1 applicator vaginally at bedtime. Use for 3 nights in a row.   triamterene-hydrochlorothiazide (MAXZIDE-25) 37.5-25 MG tablet Take 1 tablet by mouth daily.   valACYclovir (  VALTREX) 500 MG tablet Take 1 tablet (500 mg total) by mouth daily. Take one tablet by mouth twice a day for 3 days as needed for an outbreak.     Allergies:   Atenolol, Gadolinium, and Sulfonamide derivatives   Past Medical History:  Diagnosis Date   Arthritis    Atrial fibrillation ([place])    not symptomatic -on metoprolol BID for this    CAD (coronary artery disease)    s/p CAGB 2004 at [employer] [facility]) 1998   breast cancer--left   Dysrhythmia    atrial flutter/fib   GERD (gastroesophageal reflux disease)    Hypertension    Osteopenia    Personal history of chemotherapy 1998   Personal history of radiation therapy 1998   PONV (postoperative nausea and vomiting)    Shortness of breath    exertion   STD (sexually transmitted disease)    HSV   Urinary incontinence     Past Surgical History:  Procedure Laterality Date   ANTERIOR CERVICAL DECOMP/DISCECTOMY FUSION N/A  01/14/2014   Procedure: ANTERIOR CERVICAL DECOMPRESSION/DISCECTOMY FUSION CERVICAL FOUR-FIVE,CERVICAL FIVE SIX;  Surgeon: Mallory Miss, MD;  Location: [facility];  Service: Neurosurgery;  Laterality: N/A;   BREAST EXCISIONAL BIOPSY Left    BREAST LUMPECTOMY Left [place]   left lumpectomy for breast ca   COLONOSCOPY     CORONARY ARTERY BYPASS GRAFT  2004   at [place]  2006   heart bypass  2003   -single   POLYPECTOMY     TONSILLECTOMY     TOTAL KNEE ARTHROPLASTY Left 11/28/2016   Procedure: LEFT TOTAL KNEE ARTHROPLASTY;  Surgeon: Mallory Arabian, MD;  Location: [facility] ORS;  Service: Orthopedics;  Laterality: Left;  Canal block     Social History:  The patient  reports that she has quit smoking. She has never used smokeless tobacco. She reports current alcohol use of about 1.0 standard drink per week. She reports that she does not use drugs.   Family History:  The patient's family history includes Diabetes in her brother and mother; Stroke in her father and mother.    ROS:  Please see the history of present illness. All other systems are reviewed and  Negative to the above problem except as noted.    PHYSICAL EXAM: VS:  BP 122/70    Pulse 60    Ht '5\' 2"'$  (1.575 m)    Wt 172 lb (78 kg)    LMP 04/11/1994    BMI 31.46 kg/m   GEN: Well nourished, well developed, in no acute distress  HEENT: normal  Neck: no JVD   No carotid bruits    Cardiac:Irreg irreg   No S2  I/VI systolic murmur  No LE edema  Respiratory:  clear to auscultation bilaterally, GI: soft, nontender, nondistended, + BS  No hepatomegaly  MS: no deformity Moving all extremities   Skin: warm and dry, no rash Neuro:  Strength and sensation are intact Psych: euthymic mood, full affect   EKG:  EKG is ordered today.  Echo   AUg 2022  Left ventricular ejection fraction, by estimation, is 55 to 60%. Left ventricular ejection fraction by 3D volume is 56 %. The left ventricle  has normal function. The left ventricle has no regional wall motion abnormalities. There is mild left ventricular hypertrophy. Left ventricular diastolic parameters are consistent with Grade III diastolic dysfunction (restrictive). Elevated left atrial pressure. The  average left ventricular global longitudinal strain is -17.4 %. 1. Right ventricular systolic function is normal. The right ventricular size is mildly enlarged. There is mildly elevated pulmonary artery systolic pressure. The estimated right ventricular systolic pressure is 49.4 mmHg. 2. The mitral valve is degenerative. Trivial mitral valve regurgitation. No evidence of mitral stenosis. Moderate mitral annular calcification. 3. 4. Tricuspid valve regurgitation is mild to moderate. The aortic valve is tricuspid. Aortic valve regurgitation is mild to moderate. Mild to moderate aortic valve sclerosis/calcification is present, without any evidence of aortic stenosis. 5. The inferior vena cava is dilated in size with >50% respiratory variability, suggesting right atrial pressure of 8 mmHg. 6. Comparison(s): 04/20/18 EF 60-65%. PA pressure 77mHg.  Lipid Panel No results found for: CHOL, TRIG, HDL, CHOLHDL, VLDL, LDLCALC, LDLDIRECT    Wt Readings from Last 3 Encounters:  06/21/21 172 lb (78 kg)  06/16/21 170 lb (77.1 kg)  05/04/21 175 lb 3.2 oz (79.5 kg)      ASSESSMENT AND PLAN:  1  Dyspnea     1.  CAD.  Patient has remote CABG in 2004. Remote stress test   Pt denies symptoms   Follow   2.  Aortic insufficiency. Echo as noted above   No murmur on exam  FOllow with periodic echooes    3 dyslipidemia LDL is excellent at 60  COntinue meds   4 history of PAF.Pt in SR today    Keep on Eliquis   5.  Sleep apnea  Pt did not tolerate CPAP Current medicines are reviewed at length with the patient today.  The patient does not have concerns regarding medicines.  Signed, PDorris Carnes MD  06/21/2021 9:17 AM    [address]  [id number]: [phone] Fax: [phone]

## 2021-06-21 ENCOUNTER — Other Ambulatory Visit: Payer: Self-pay

## 2021-06-21 ENCOUNTER — Encounter: Payer: Self-pay | Admitting: Internal Medicine

## 2021-06-21 ENCOUNTER — Ambulatory Visit: Payer: Medicare Other | Admitting: Internal Medicine

## 2021-06-21 ENCOUNTER — Ambulatory Visit (INDEPENDENT_AMBULATORY_CARE_PROVIDER_SITE_OTHER): Payer: Medicare Other

## 2021-06-21 ENCOUNTER — Telehealth (HOSPITAL_COMMUNITY): Payer: Self-pay | Admitting: *Deleted

## 2021-06-21 VITALS — BP 122/70 | HR 60 | Ht 62.0 in | Wt 172.0 lb

## 2021-06-21 DIAGNOSIS — I351 Nonrheumatic aortic (valve) insufficiency: Secondary | ICD-10-CM

## 2021-06-21 DIAGNOSIS — I4892 Unspecified atrial flutter: Secondary | ICD-10-CM

## 2021-06-21 DIAGNOSIS — Z79899 Other long term (current) drug therapy: Secondary | ICD-10-CM

## 2021-06-21 DIAGNOSIS — Z951 Presence of aortocoronary bypass graft: Secondary | ICD-10-CM

## 2021-06-21 DIAGNOSIS — I2510 Atherosclerotic heart disease of native coronary artery without angina pectoris: Secondary | ICD-10-CM | POA: Diagnosis not present

## 2021-06-21 DIAGNOSIS — E785 Hyperlipidemia, unspecified: Secondary | ICD-10-CM

## 2021-06-21 NOTE — Progress Notes (Unsigned)
Enrolled for [employer] to mail a ZIO XT long term holter monitor to the patients address on file.   Dr. Ross to read. 

## 2021-06-21 NOTE — Telephone Encounter (Signed)
Patient given detailed instructions per Myocardial Perfusion Study Information Sheet for the test on 06/28/21 Patient notified to arrive 15 minutes early and that it is imperative to arrive on time for appointment to keep from having the test rescheduled. ? If you need to cancel or reschedule your appointment, please call the office within 24 hours of your appointment. . Patient verbalized understanding. Kirstie Peri ? ? ?

## 2021-06-21 NOTE — Patient Instructions (Addendum)
Medication Instructions:  ?Your physician recommends that you continue on your current medications as directed. Please refer to the Current Medication list given to you today. ? ?*If you need a refill on your cardiac medications before your next appointment, please call your pharmacy* ? ? ?Lab Work: ?Art gallery manager and pro bnp today ?If you have labs (blood work) drawn today and your tests are completely normal, you will receive your results only by: ?MyChart Message (if you have MyChart) OR ?A paper copy in the mail ?If you have any lab test that is abnormal or we need to change your treatment, we will call you to review the results. ? ? ?Testing/Procedures: ?Your physician has requested that you have a lexiscan myoview. For further information please visit [email or web address]. Please follow instruction sheet, as given. ? ? ?ZIO XT- Long Term Monitor Instructions ? ?Your physician has requested you wear a ZIO patch monitor for 14 days.  ?This is a single patch monitor. [employer] supplies one patch monitor per enrollment. Additional ?stickers are not available. Please do not apply patch if you will be having a Nuclear Stress Test,  ?Echocardiogram, Cardiac CT, MRI, or Chest Xray during the period you would be wearing the  ?monitor. The patch cannot be worn during these tests. You cannot remove and re-apply the  ?ZIO XT patch monitor.  ?Your ZIO patch monitor will be mailed 3 day USPS to your address on file. It may take 3-5 days  ?to receive your monitor after you have been enrolled.  ?Once you have received your monitor, please review the enclosed instructions. Your monitor  ?has already been registered assigning a specific monitor serial # to you. ? ?Billing and Patient Assistance Program Information ? ?We have supplied [employer] with any of your insurance information on file for billing purposes. ?[employer] offers a sliding scale Patient Assistance Program for patients that do not have  ?insurance, or whose insurance does not  completely cover the cost of the ZIO monitor.  ?You must apply for the Patient Assistance Program to qualify for this discounted rate.  ?To apply, please call [employer] at [phone], select option 4, select option 2, ask to apply for  ?Patient Assistance Program. Theodore Demark will ask your household income, and how many people  ?are in your household. They will quote your out-of-pocket cost based on that information.  ?[employer] will also be able to set up a 69-month interest-free payment plan if needed. ? ?Applying the monitor ?  ?Shave hair from upper left chest.  ?Hold abrader disc by orange tab. Rub abrader in 40 strokes over the upper left chest as  ?indicated in your monitor instructions.  ?Clean area with 4 enclosed alcohol pads. Let dry.  ?Apply patch as indicated in monitor instructions. Patch will be placed under collarbone on left  ?side of chest with arrow pointing upward.  ?Rub patch adhesive wings for 2 minutes. Remove white label marked "1". Remove the white  ?label marked "2". Rub patch adhesive wings for 2 additional minutes.  ?While looking in a mirror, press and release button in center of patch. A small green light will  ?flash 3-4 times. This will be your only indicator that the monitor has been turned on.  ?Do not shower for the first 24 hours. You may shower after the first 24 hours.  ?Press the button if you feel a symptom. You will hear a small click. Record Date, Time and  ?Symptom in the Patient Logbook.  ?When you are ready to remove  the patch, follow instructions on the last 2 pages of Patient  ?Logbook. Stick patch monitor onto the last page of Patient Logbook.  ?Place Patient Logbook in the blue and white box. Use locking tab on box and tape box closed  ?securely. The blue and white box has prepaid postage on it. Please place it in the mailbox as  ?soon as possible. Your physician should have your test results approximately 7 days after the  ?monitor has been mailed back to [facility].  ?Call  [facility] at [phone] if you have questions regarding  ?your ZIO XT patch monitor. Call them immediately if you see an orange light blinking on your  ?monitor.  ?If your monitor falls off in less than 4 days, contact our Monitor department at [phone].  ?If your monitor becomes loose or falls off after 4 days call [employer] at [phone] for  ?suggestions on securing your monitor ? ?Follow-Up: ?At [facility], you and your health needs are our priority.  As part of our continuing mission to provide you with exceptional heart care, we have created designated Provider Care Teams.  These Care Teams include your primary Cardiologist (physician) and Advanced Practice Providers (APPs -  Physician Assistants and Nurse Practitioners) who all work together to provide you with the care you need, when you need it. ? ?We recommend signing up for the patient portal called "MyChart".  Sign up information is provided on this After Visit Summary.  MyChart is used to connect with patients for Virtual Visits (Telemedicine).  Patients are able to view lab/test results, encounter notes, upcoming appointments, etc.  Non-urgent messages can be sent to your provider as well.   ?To learn more about what you can do with MyChart, go to [email or web address].   ? ?Your next appointment:   ?6 month(s) ? ?The format for your next appointment:   ?In Person ? ?Provider:   ?[name], MD   ? ? ?Other Instructions ?  ?

## 2021-06-22 LAB — BASIC METABOLIC PANEL
BUN/Creatinine Ratio: 21 (ref 12–28)
BUN: 25 mg/dL (ref 8–27)
CO2: 27 mmol/L (ref 20–29)
Calcium: 9.1 mg/dL (ref 8.7–10.3)
Chloride: 104 mmol/L (ref 96–106)
Creatinine, Ser: 1.21 mg/dL — ABNORMAL HIGH (ref 0.57–1.00)
Glucose: 126 mg/dL — ABNORMAL HIGH (ref 70–99)
Potassium: 3.7 mmol/L (ref 3.5–5.2)
Sodium: 142 mmol/L (ref 134–144)
eGFR: 46 mL/min/{1.73_m2} — ABNORMAL LOW (ref >59)

## 2021-06-22 LAB — PRO B NATRIURETIC PEPTIDE: NT-Pro BNP: 1279 pg/mL — ABNORMAL HIGH (ref 0–738)

## 2021-06-25 ENCOUNTER — Telehealth: Payer: Self-pay

## 2021-06-25 DIAGNOSIS — I2510 Atherosclerotic heart disease of native coronary artery without angina pectoris: Secondary | ICD-10-CM

## 2021-06-25 DIAGNOSIS — I351 Nonrheumatic aortic (valve) insufficiency: Secondary | ICD-10-CM

## 2021-06-25 DIAGNOSIS — Z79899 Other long term (current) drug therapy: Secondary | ICD-10-CM

## 2021-06-25 MED ORDER — FUROSEMIDE 40 MG PO TABS: ORAL_TABLET | ORAL | 0 refills | Status: DC

## 2021-06-25 NOTE — Telephone Encounter (Signed)
Pt advised and she reports that she has a terrible cold and does not feel well with an agressive cough... she may try to see her PCP and be sure it is not COVID.... she will start the Lasix today and we moved her stress test to a week later 07/05/21 when she will also have her labwork.  ?

## 2021-06-25 NOTE — Telephone Encounter (Signed)
-----   Message from Fay Records, MD sent at 06/23/2021 11:07 PM EDT ----- ?Fluid is up ?I would recomm adding furosemide 40 mg every other day for a week. ?On day take it , then I would take double potassium in morning    ?F/U BMET and BNP in 10 days  ?

## 2021-06-28 ENCOUNTER — Telehealth (HOSPITAL_COMMUNITY): Payer: Self-pay | Admitting: *Deleted

## 2021-06-28 ENCOUNTER — Encounter (HOSPITAL_COMMUNITY): Payer: Medicare Other

## 2021-06-28 ENCOUNTER — Encounter (HOSPITAL_COMMUNITY): Payer: Self-pay | Admitting: *Deleted

## 2021-06-28 NOTE — Telephone Encounter (Signed)
MyChart letter sent outlining instructions for upcoming stress test on 07/05/21 at 8:15. ?

## 2021-07-05 ENCOUNTER — Ambulatory Visit (HOSPITAL_COMMUNITY): Payer: Medicare Other | Attending: Cardiology

## 2021-07-05 ENCOUNTER — Other Ambulatory Visit: Payer: Self-pay

## 2021-07-05 ENCOUNTER — Other Ambulatory Visit: Payer: Medicare Other | Admitting: *Deleted

## 2021-07-05 DIAGNOSIS — E785 Hyperlipidemia, unspecified: Secondary | ICD-10-CM | POA: Insufficient documentation

## 2021-07-05 DIAGNOSIS — Z951 Presence of aortocoronary bypass graft: Secondary | ICD-10-CM | POA: Insufficient documentation

## 2021-07-05 DIAGNOSIS — I351 Nonrheumatic aortic (valve) insufficiency: Secondary | ICD-10-CM

## 2021-07-05 DIAGNOSIS — I2510 Atherosclerotic heart disease of native coronary artery without angina pectoris: Secondary | ICD-10-CM | POA: Diagnosis not present

## 2021-07-05 DIAGNOSIS — Z79899 Other long term (current) drug therapy: Secondary | ICD-10-CM | POA: Insufficient documentation

## 2021-07-05 DIAGNOSIS — I4892 Unspecified atrial flutter: Secondary | ICD-10-CM | POA: Insufficient documentation

## 2021-07-05 LAB — MYOCARDIAL PERFUSION IMAGING
LV dias vol: 67 mL (ref 46–106)
LV sys vol: 35 mL
Nuc Stress EF: 47 %
Peak HR: 94 {beats}/min
Rest HR: 66 {beats}/min
Rest Nuclear Isotope Dose: 10.4 mCi
SDS: 2
SRS: 2
SSS: 4
Stress Nuclear Isotope Dose: 32.7 mCi
TID: 0.93

## 2021-07-05 MED ORDER — TECHNETIUM TC 99M TETROFOSMIN IV KIT
10.4000 | PACK | Freq: Once | INTRAVENOUS | Status: AC | PRN
  Administered 2021-07-05: 10.4 via INTRAVENOUS
  Filled 2021-07-05: qty 11

## 2021-07-05 MED ORDER — TECHNETIUM TC 99M TETROFOSMIN IV KIT
32.7000 | PACK | Freq: Once | INTRAVENOUS | Status: AC | PRN
  Administered 2021-07-05: 32.7 via INTRAVENOUS
  Filled 2021-07-05: qty 33

## 2021-07-05 MED ORDER — REGADENOSON 0.4 MG/5ML IV SOLN
0.4000 mg | Freq: Once | INTRAVENOUS | Status: AC
  Administered 2021-07-05: 0.4 mg via INTRAVENOUS

## 2021-07-06 LAB — PRO B NATRIURETIC PEPTIDE: NT-Pro BNP: 1253 pg/mL — ABNORMAL HIGH (ref 0–738)

## 2021-07-06 LAB — BASIC METABOLIC PANEL
BUN/Creatinine Ratio: 22 (ref 12–28)
BUN: 29 mg/dL — ABNORMAL HIGH (ref 8–27)
CO2: 23 mmol/L (ref 20–29)
Calcium: 9 mg/dL (ref 8.7–10.3)
Chloride: 103 mmol/L (ref 96–106)
Creatinine, Ser: 1.33 mg/dL — ABNORMAL HIGH (ref 0.57–1.00)
Glucose: 137 mg/dL — ABNORMAL HIGH (ref 70–99)
Potassium: 3.8 mmol/L (ref 3.5–5.2)
Sodium: 139 mmol/L (ref 134–144)
eGFR: 41 mL/min/{1.73_m2} — ABNORMAL LOW (ref >59)

## 2021-07-08 DIAGNOSIS — I4892 Unspecified atrial flutter: Secondary | ICD-10-CM

## 2021-07-08 DIAGNOSIS — I4891 Unspecified atrial fibrillation: Secondary | ICD-10-CM

## 2021-07-11 ENCOUNTER — Telehealth: Payer: Self-pay

## 2021-07-11 DIAGNOSIS — Z79899 Other long term (current) drug therapy: Secondary | ICD-10-CM

## 2021-07-11 DIAGNOSIS — I351 Nonrheumatic aortic (valve) insufficiency: Secondary | ICD-10-CM

## 2021-07-11 DIAGNOSIS — I2510 Atherosclerotic heart disease of native coronary artery without angina pectoris: Secondary | ICD-10-CM

## 2021-07-11 MED ORDER — FUROSEMIDE 40 MG PO TABS
60.0000 mg | ORAL_TABLET | ORAL | 3 refills | Status: DC
Start: 2021-07-11 — End: 2021-07-14

## 2021-07-11 NOTE — Telephone Encounter (Signed)
-----   Message from Fay Records, MD sent at 07/07/2021  5:54 AM EDT ----- ?No evidence for ischemia in study    ?Pt was in atrial fibrillation during study ?Please order repeat echo to reevaluated systolic and diastolic function, relook at aortic valve, aortic insufficiency ?Pt has monitor ?

## 2021-07-11 NOTE — Telephone Encounter (Signed)
-----   Message from Fay Records, MD sent at 07/08/2021 11:14 PM EDT ----- ?Repeat BMET , BNP in 10 days  ?

## 2021-07-11 NOTE — Telephone Encounter (Signed)
Sent My Chart to the pt... will follow up with her via Phone call.  ? ? ? ?Fluid is still increased ?Cr 1.33 , slightly increased from last check and over past couple years ?I would try 60 mg every other day (1.5 tabs) ?Watch salt (2-3 grams/ day)   Limit fluids to 1.5 to 2 L max ? ? ?

## 2021-07-13 LAB — BASIC METABOLIC PANEL

## 2021-07-13 LAB — PRO B NATRIURETIC PEPTIDE

## 2021-07-13 NOTE — Telephone Encounter (Signed)
Pt advised her results and agreed to having an Echo.  ?

## 2021-07-14 ENCOUNTER — Telehealth: Payer: Self-pay

## 2021-07-14 MED ORDER — METOPROLOL TARTRATE 50 MG PO TABS: 75.0000 mg | ORAL_TABLET | Freq: Two times a day (BID) | ORAL | 3 refills | Status: DC

## 2021-07-14 MED ORDER — POTASSIUM CHLORIDE CRYS ER 20 MEQ PO TBCR: 20.0000 meq | EXTENDED_RELEASE_TABLET | Freq: Two times a day (BID) | ORAL | 3 refills | Status: DC

## 2021-07-14 MED ORDER — FUROSEMIDE 40 MG PO TABS: 40.0000 mg | ORAL_TABLET | ORAL | 3 refills | Status: DC

## 2021-07-14 NOTE — Telephone Encounter (Signed)
-----   Message from Fay Records, MD sent at 07/14/2021 10:40 AM EDT ----- ?Called pt  ?BP at home 140 t o150/    ?Admits to eating out some  ? ?Recomm:  Increase metoprolol to 75 bid ?Take 40 mg lasix every other day ?Pt needs more metoprolol and also potasium ?

## 2021-07-14 NOTE — Telephone Encounter (Signed)
Pt meds updated per Dr. Harrington Challenger' result note.  ?

## 2021-07-23 ENCOUNTER — Other Ambulatory Visit: Payer: Medicare Other | Admitting: *Deleted

## 2021-07-23 ENCOUNTER — Ambulatory Visit (HOSPITAL_COMMUNITY): Payer: Medicare Other | Attending: Internal Medicine

## 2021-07-23 DIAGNOSIS — I2510 Atherosclerotic heart disease of native coronary artery without angina pectoris: Secondary | ICD-10-CM | POA: Insufficient documentation

## 2021-07-23 DIAGNOSIS — Z79899 Other long term (current) drug therapy: Secondary | ICD-10-CM | POA: Insufficient documentation

## 2021-07-23 DIAGNOSIS — I351 Nonrheumatic aortic (valve) insufficiency: Secondary | ICD-10-CM

## 2021-07-23 LAB — ECHOCARDIOGRAM COMPLETE
Area-P 1/2: 4.27 cm2
P 1/2 time: 665 msec
S' Lateral: 2.9 cm

## 2021-07-24 LAB — BASIC METABOLIC PANEL
BUN/Creatinine Ratio: 20 (ref 12–28)
BUN: 28 mg/dL — ABNORMAL HIGH (ref 8–27)
CO2: 24 mmol/L (ref 20–29)
Calcium: 9.4 mg/dL (ref 8.7–10.3)
Chloride: 100 mmol/L (ref 96–106)
Creatinine, Ser: 1.38 mg/dL — ABNORMAL HIGH (ref 0.57–1.00)
Glucose: 151 mg/dL — ABNORMAL HIGH (ref 70–99)
Potassium: 3.6 mmol/L (ref 3.5–5.2)
Sodium: 142 mmol/L (ref 134–144)
eGFR: 39 mL/min/{1.73_m2} — ABNORMAL LOW (ref >59)

## 2021-07-24 LAB — PRO B NATRIURETIC PEPTIDE: NT-Pro BNP: 1071 pg/mL — ABNORMAL HIGH (ref 0–738)

## 2021-07-25 ENCOUNTER — Encounter: Payer: Self-pay | Admitting: Internal Medicine

## 2021-07-26 MED ORDER — FUROSEMIDE 40 MG PO TABS: 40.0000 mg | ORAL_TABLET | ORAL | 3 refills | Status: DC

## 2021-07-28 ENCOUNTER — Telehealth: Payer: Self-pay

## 2021-07-28 ENCOUNTER — Encounter: Payer: Self-pay | Admitting: Pulmonary Disease

## 2021-07-28 ENCOUNTER — Ambulatory Visit (INDEPENDENT_AMBULATORY_CARE_PROVIDER_SITE_OTHER): Payer: Medicare Other | Admitting: Pulmonary Disease

## 2021-07-28 VITALS — BP 128/76 | HR 86 | Temp 98.0°F | Ht 62.0 in | Wt 168.2 lb

## 2021-07-28 DIAGNOSIS — Z79899 Other long term (current) drug therapy: Secondary | ICD-10-CM

## 2021-07-28 DIAGNOSIS — R0683 Snoring: Secondary | ICD-10-CM | POA: Diagnosis not present

## 2021-07-28 DIAGNOSIS — I2510 Atherosclerotic heart disease of native coronary artery without angina pectoris: Secondary | ICD-10-CM

## 2021-07-28 DIAGNOSIS — I351 Nonrheumatic aortic (valve) insufficiency: Secondary | ICD-10-CM

## 2021-07-28 MED ORDER — FUROSEMIDE 40 MG PO TABS: 60.0000 mg | ORAL_TABLET | ORAL | 3 refills | Status: DC

## 2021-07-28 NOTE — Telephone Encounter (Signed)
Pt advised and she will have labs 08/13/21.  ?

## 2021-07-28 NOTE — Patient Instructions (Signed)
Will arrange for home sleep study Will call to arrange for follow up after sleep study reviewed  

## 2021-07-28 NOTE — Telephone Encounter (Signed)
-----   Message from Fay Records, MD sent at 07/25/2021  7:11 PM EDT ----- ?Fluid is a little better  ?Can try 60 mg lasix xevery other day    ?Get BMET and BNP in 2 wks  ?Watch/limit salt ?

## 2021-07-28 NOTE — Progress Notes (Addendum)
? ?[facility] ? ?Chief Complaint  ?Patient presents with  ? Consult  ?  Sleep study completed in 2020, not currently on C Pap  ? ? ?Past Surgical History:  ?She  has a past surgical history that includes Breast surgery (1998); heart bypass (2003); Tonsillectomy; Anterior cervical decomp/discectomy fusion (N/A, 01/14/2014); Colonoscopy; Polypectomy; Breast lumpectomy (Left, 1998); Coronary artery bypass graft (2004); Total knee arthroplasty (Left, 11/28/2016); Breast excisional biopsy (Left); and Dilation and curettage of uterus (2006). ? ?Past Medical History:  ?OA, CAD, A fib, Breast cancer 1998, GERD, HTN, Osteopenia ? ?Constitutional:  ?BP 128/76 (BP Location: Right Arm, Patient Position: Sitting, Cuff Size: Normal)   Pulse 86   Temp 98 ?F (36.7 ?C) (Oral)   Ht '5\' 2"'$  (1.575 m)   Wt 168 lb 3.2 oz (76.3 kg)   LMP 04/11/1994   SpO2 96%   BMI 30.76 kg/m?  ? ?Brief Summary:  ?Mallory Watts is a 81 y.o. female former smoker with sleep apnea. ?  ? ? ? ?Subjective:  ? ?Previously seen by Dr. Rexene Alberts with neurology.  Found to have complex sleep apnea.  She was using auto Bipap and [place].  She had trouble with pressure setting being too high, mask leak, and mask fit.  She was never tried on supplemental oxygen with PAP therapy.  She has not used any therapy for the past couple of years. ? ?She is feeling low energy during the day.  She can fall asleep easily when sitting quiet.  She doesn't feel like she gets restful sleep.  She snores and breaths through her mouth at night.   ? ?She has history of atrial fibrillation and there recently was concern she has heart failure.   ? ?She goes to sleep at 10 pm.  She falls asleep in a couple of hours.  She wakes up 1 or 2 times to use the bathroom.  She gets out of bed at 7 am.  She feels tired in the morning.  She denies morning headache.  She does not use anything to help her fall stay awake.  She has used [place] before.  More  recently she has used lunesta and melatonin to help sleep. ? ?She denies sleep walking, sleep talking, bruxism, or nightmares.  There is no history of restless legs.  She denies sleep hallucinations, sleep paralysis, or cataplexy. ? ?The Epworth score is 16 out of 24. ? ? ?Physical Exam:  ? ?Appearance - well kempt  ? ?ENMT - no sinus tenderness, no oral exudate, no LAN, Mallampati 3 airway, no stridor, over bite ? ?Respiratory - equal breath sounds bilaterally, no wheezing or rales ? ?CV - s1s2 regular rate and rhythm, no murmurs ? ?Ext - no clubbing, no edema ? ?Skin - no rashes ? ?Psych - normal mood and affect ?  ?Sleep Tests:  ?PSG 10/11/18 >> AHI 58.7, SpO2 low 80% ? ?Cardiac Tests:  ?Echo 07/23/21 >> EF 60 to 65%, mild LVH, mod LA dilation, severe RA dilation, mild MR, mild/mod TR, mild/mod AR ? ?Social History:  ?She  reports that she has quit smoking. She has never used smokeless tobacco. She reports current alcohol use of about 1.0 standard drink per week. She reports that she does not use drugs. ? ?Family History:  ?Her family history includes Diabetes in her brother and mother; Stroke in her father and mother. ?  ? ?Discussion:  ?She history of complex sleep apnea.  She had trouble  tolerating PAP therapy before due to high pressure settings, mask fit, and mask leak.  She has continued snoring, sleep disruption, apnea, and daytime sleepiness.  She has history of CAD, HTN, CHF, and A fib.  I am concerned she could still have significant sleep apnea.  If she is found to have sleep apnea still, then she might need PAP therapy with nocturnal oxygen. ? ?Assessment/Plan:  ? ?Snoring with excessive daytime sleepiness. ?- will need to arrange for a home sleep study ? ?Atrial fibrillation, Valvular heart disease, Coronary artery disease, Hypertension. ?- followed by Dr. [name] with cardiology ? ?Obesity. ?- discussed how weight can impact sleep and risk for sleep disordered breathing ?- discussed options to  assist with weight loss: combination of diet modification, cardiovascular and strength training exercises ? ?Cardiovascular risk. ?- had an extensive discussion regarding the adverse health consequences related to untreated sleep disordered breathing ?- specifically discussed the risks for hypertension, coronary artery disease, cardiac dysrhythmias, cerebrovascular disease, and diabetes ?- lifestyle modification discussed ? ?Safe driving practices. ?- discussed how sleep disruption can increase risk of accidents, particularly when driving ?- safe driving practices were discussed ? ?Therapies for obstructive sleep apnea. ?- if the sleep study shows significant sleep apnea, then various therapies for treatment were reviewed: CPAP, oral appliance, and surgical interventions ? ?Time Spent Involved in Patient Care on Day of Examination:  ?35 minutes ? ?Follow up:  ? ?Patient Instructions  ?Will arrange for home sleep study ?Will call to arrange for follow up after sleep study reviewed ? ? ?Medication List:  ? ?Allergies as of 07/28/2021   ? ?   Reactions  ? Atenolol Other (See Comments)  ? Gadolinium Nausea Only, Other (See Comments)  ?  Desc: PATIENT FELT DIZZY AND NAUSEOUS AFTER GAD INJECTION. NO VOMITING,NO HIVES, NO RASH., Onset Date: [id number]  ? Sulfonamide Derivatives Hives  ? ?  ? ?  ?Medication List  ?  ? ?  ? Accurate as of July 28, 2021  9:37 AM. If you have any questions, ask your nurse or doctor.  ?  ?  ? ?  ? ?acetaminophen 650 MG CR tablet ?Commonly known as: TYLENOL ?Take 1,300 mg by mouth 2 (two) times daily as needed for pain. ?  ?alendronate 70 MG tablet ?Commonly known as: FOSAMAX ?Take 70 mg by mouth once a week. Take with a full glass of water on an empty stomach. ?  ?apixaban 5 MG Tabs tablet ?Commonly known as: ELIQUIS ?Take 1 tablet (5 mg total) by mouth 2 (two) times daily. ?  ?atorvastatin 80 MG tablet ?Commonly known as: LIPITOR ?TAKE 1 TABLET BY MOUTH EVERY DAY ?  ?diltiazem 120 MG 24 hr  capsule ?Commonly known as: CARDIZEM CD ?Take 1 capsule (120 mg total) by mouth daily. ?  ?ezetimibe 10 MG tablet ?Commonly known as: ZETIA ?TAKE 1 TABLET BY MOUTH EVERY DAY ?  ?fexofenadine 180 MG tablet ?Commonly known as: ALLEGRA ?Take 180 mg by mouth daily as needed. ?  ?furosemide 40 MG tablet ?Commonly known as: LASIX ?Take 1 tablet (40 mg total) by mouth every other day. ?  ?Magnesium 250 MG Tabs ?Take 250 mg by mouth daily as needed (LEG CRAMPS). Reported on 05/01/2015 ?  ?meclizine 12.5 MG tablet ?Commonly known as: ANTIVERT ?Take 12.5 mg by mouth 3 (three) times daily as needed for dizziness. ?  ?metoprolol tartrate 50 MG tablet ?Commonly known as: LOPRESSOR ?Take 1.5 tablets (75 mg total) by mouth 2 (two) times daily. ?  ?  mometasone 50 MCG/ACT nasal spray ?Commonly known as: NASONEX ?Place 2 sprays into the nose daily. ?  ?nitroGLYCERIN 0.4 MG SL tablet ?Commonly known as: NITROSTAT ?PLACE 1 TABLET UNDER THE TONGUE EVERY 5 MINUTES AS NEEDED FOR CHEST PAIN. KEEP UPCOMING APPT ?  ?nystatin ointment ?Commonly known as: MYCOSTATIN ?1 application ?  ?omeprazole 40 MG capsule ?Commonly known as: PRILOSEC ?Take 40 mg by mouth daily. ?  ?potassium chloride SA 20 MEQ tablet ?Commonly known as: KLOR-CON M ?Take 1 tablet (20 mEq total) by mouth 2 (two) times daily. ?  ?solifenacin 5 MG tablet ?Commonly known as: VESIcare ?One daily as needed ?  ?terconazole 0.8 % vaginal cream ?Commonly known as: TERAZOL 3 ?Place 1 applicator vaginally at bedtime. Use for 3 nights in a row. ?  ?triamterene-hydrochlorothiazide 37.5-25 MG tablet ?Commonly known as: MAXZIDE-25 ?Take 1 tablet by mouth daily. ?  ?TUMS PO ?Take 1 tablet by mouth daily. ?  ?valACYclovir 500 MG tablet ?Commonly known as: VALTREX ?Take 1 tablet (500 mg total) by mouth daily. Take one tablet by mouth twice a day for 3 days as needed for an outbreak. ?  ? ?  ? ? ?Signature:  ?Chesley Mires, MD ?[place] ?Pager - [phone] ?07/28/2021,  9:37 AM ?  ? ? ? ? ? ? ? ? ?

## 2021-07-29 ENCOUNTER — Encounter: Payer: Self-pay | Admitting: Internal Medicine

## 2021-08-04 ENCOUNTER — Ambulatory Visit: Payer: Medicare Other | Admitting: Nurse Practitioner

## 2021-08-09 MED ORDER — MULTAQ 400 MG PO TABS
400.0000 mg | ORAL_TABLET | Freq: Two times a day (BID) | ORAL | 3 refills | Status: DC
Start: 2021-08-09 — End: 2021-10-27

## 2021-08-10 ENCOUNTER — Encounter: Payer: Self-pay | Admitting: Internal Medicine

## 2021-08-11 NOTE — Telephone Encounter (Signed)
I spoke with the pt and we went over her monitor again in depth and the reason for starting the [place] and she will keep her appt with Dr Harrington Challenger 08/31/21.  ?

## 2021-08-13 ENCOUNTER — Other Ambulatory Visit: Payer: Medicare Other | Admitting: *Deleted

## 2021-08-13 DIAGNOSIS — I351 Nonrheumatic aortic (valve) insufficiency: Secondary | ICD-10-CM

## 2021-08-13 DIAGNOSIS — I2510 Atherosclerotic heart disease of native coronary artery without angina pectoris: Secondary | ICD-10-CM

## 2021-08-13 DIAGNOSIS — Z79899 Other long term (current) drug therapy: Secondary | ICD-10-CM

## 2021-08-14 ENCOUNTER — Encounter: Payer: Self-pay | Admitting: Internal Medicine

## 2021-08-14 LAB — BASIC METABOLIC PANEL
BUN/Creatinine Ratio: 22 (ref 12–28)
BUN: 33 mg/dL — ABNORMAL HIGH (ref 8–27)
CO2: 24 mmol/L (ref 20–29)
Calcium: 9.4 mg/dL (ref 8.7–10.3)
Chloride: 100 mmol/L (ref 96–106)
Creatinine, Ser: 1.5 mg/dL — ABNORMAL HIGH (ref 0.57–1.00)
Glucose: 132 mg/dL — ABNORMAL HIGH (ref 70–99)
Potassium: 3.2 mmol/L — ABNORMAL LOW (ref 3.5–5.2)
Sodium: 139 mmol/L (ref 134–144)
eGFR: 35 mL/min/{1.73_m2} — ABNORMAL LOW (ref >59)

## 2021-08-14 LAB — PRO B NATRIURETIC PEPTIDE: NT-Pro BNP: 1438 pg/mL — ABNORMAL HIGH (ref 0–738)

## 2021-08-16 ENCOUNTER — Telehealth: Payer: Self-pay

## 2021-08-16 ENCOUNTER — Ambulatory Visit: Payer: Medicare Other

## 2021-08-16 DIAGNOSIS — G4733 Obstructive sleep apnea (adult) (pediatric): Secondary | ICD-10-CM

## 2021-08-16 DIAGNOSIS — R0683 Snoring: Secondary | ICD-10-CM

## 2021-08-16 NOTE — Telephone Encounter (Signed)
I called the pt re: her 3.2 K from Friday 08/13/21... her med list says she is taking K 20 meq bid but she says she is taking 10 meq TID... per Dr Johney Frame, I asked her to take an extra 2 doses tonight and I will talk with Dr Harrington Challenger in the morning for further recommendations.  ?

## 2021-08-17 MED ORDER — POTASSIUM CHLORIDE CRYS ER 20 MEQ PO TBCR: 20.0000 meq | EXTENDED_RELEASE_TABLET | Freq: Two times a day (BID) | ORAL | 3 refills | Status: DC

## 2021-08-17 NOTE — Telephone Encounter (Signed)
Take 20 mg bid    ?Follow up  BMET in 10 to 14 days ? ?

## 2021-08-17 NOTE — Telephone Encounter (Signed)
Spoke with the pt and she will start taking the KCL 20 meq BID... she will have repeat labs at her OV 08/31/21.  ?

## 2021-08-17 NOTE — Telephone Encounter (Signed)
Left a message for the pt to call back.  

## 2021-08-18 ENCOUNTER — Encounter: Payer: Self-pay | Admitting: Hematology and Oncology

## 2021-08-19 ENCOUNTER — Telehealth: Payer: Self-pay | Admitting: Pulmonary Disease

## 2021-08-19 DIAGNOSIS — G4733 Obstructive sleep apnea (adult) (pediatric): Secondary | ICD-10-CM | POA: Diagnosis not present

## 2021-08-19 NOTE — Telephone Encounter (Signed)
HST 08/16/21 >> AHI 47.4, SpO2 low 72% ? ? ?Please inform her that her sleep study shows severe obstructive sleep apnea.  Please arrange for ROV with me or NP to discuss treatment options. ? ? ? ?

## 2021-08-20 ENCOUNTER — Other Ambulatory Visit: Payer: Self-pay

## 2021-08-20 MED ORDER — FUROSEMIDE 40 MG PO TABS: 60.0000 mg | ORAL_TABLET | ORAL | 3 refills | Status: DC

## 2021-08-20 NOTE — Telephone Encounter (Signed)
ATC patient. She stated now wasn't a good time and we could try to reach each other again in ten minutes. Did advise patient that I am with patients this afternoon but would try to give her a call back as soon as I could.  ?

## 2021-08-20 NOTE — Telephone Encounter (Signed)
Called and went over results with patient she voiced understanding. Scheduled her an appt with [facility] in [place] office on Tuesday to discuss treatment options. She requested results be sent to her Cardiologist.  Dr. [name] ?

## 2021-08-24 ENCOUNTER — Encounter: Payer: Self-pay | Admitting: Nurse Practitioner

## 2021-08-24 ENCOUNTER — Ambulatory Visit: Payer: Medicare Other | Admitting: Nurse Practitioner

## 2021-08-24 VITALS — BP 120/64 | HR 60 | Temp 97.9°F | Ht 62.0 in | Wt 170.6 lb

## 2021-08-24 DIAGNOSIS — G4733 Obstructive sleep apnea (adult) (pediatric): Secondary | ICD-10-CM

## 2021-08-24 DIAGNOSIS — J3089 Other allergic rhinitis: Secondary | ICD-10-CM

## 2021-08-24 DIAGNOSIS — I4892 Unspecified atrial flutter: Secondary | ICD-10-CM

## 2021-08-24 DIAGNOSIS — R0609 Other forms of dyspnea: Secondary | ICD-10-CM

## 2021-08-24 DIAGNOSIS — R053 Chronic cough: Secondary | ICD-10-CM

## 2021-08-24 DIAGNOSIS — I509 Heart failure, unspecified: Secondary | ICD-10-CM

## 2021-08-24 MED ORDER — ALBUTEROL SULFATE HFA 108 (90 BASE) MCG/ACT IN AERS: 2.0000 | INHALATION_SPRAY | Freq: Four times a day (QID) | RESPIRATORY_TRACT | 6 refills | Status: DC | PRN

## 2021-08-24 MED ORDER — MOMETASONE FUROATE 50 MCG/ACT NA SUSP: 2.0000 | Freq: Every day | NASAL | 5 refills | Status: DC

## 2021-08-24 NOTE — Assessment & Plan Note (Signed)
Does have seasonal allergies and feels like she flares some during allergy season.  Currently on omeprazole for GERD and well-controlled.  Advised she restart Nasonex for postnasal drainage control.  Continue on Allegra for trigger prevention.  Trial of albuterol given progressive DOE and PFTs ordered today.  May need dedicated CT scan of the chest if symptoms persist. ?

## 2021-08-24 NOTE — Assessment & Plan Note (Signed)
History of complex sleep apnea, previously on BiPAP.  Was intolerant to it.  Recent HST showed severe sleep apnea with AHI 47.4.  Patient is unsure if she would like to retry BiPAP or CPAP or move forward with possible inspire device.  CPAP titration study ordered today if she decides to move forward with therapy.  Advised her to notify us if she wants to be referred to ENT for surgical intervention.  Patient wants to discuss with her cardiologist next week and will notify us of her decision. We discussed how untreated sleep apnea puts an individual at risk for cardiac arrhthymias, pulm HTN, DM, stroke and increases their risk for daytime accidents.  Suspect that some of her DOE is related to uncontrolled severe OSA. ? ?Patient Instructions  ?Continue Allegra 170 mg daily for allergies  ?Restart nasonex nasal spray 2 sprays each nostril daily for allergies ?Continue omeprazole 40 mg daily for reflux ? ?Trial Albuterol inhaler 2 puffs every 6 hours as needed for shortness of breath or wheezing.  ? ?CPAP titration study ordered - if you decide you would like to move forward with Inspire device instead, please let me know so I can send the referral to ENT and cancel titration study. ? ?Pulmonary function testing scheduled today. ? ?Follow up after PFTs with Dr. Halford Chessman or Alanson Aly. If symptoms do not improve or worsen, please contact office for sooner follow up or seek emergency care. ? ? ?

## 2021-08-24 NOTE — Assessment & Plan Note (Signed)
Rate controlled.  Regular rhythm.  Chronically anticoagulated with Eliquis.  No excessive bleeding or bruising.  Follow-up with cardiology as scheduled. ?

## 2021-08-24 NOTE — Progress Notes (Signed)
? ?'@Patient'$  ID: Mallory Watts, female    DOB: 12-15-1940, 81 y.o.   MRN: [id number] ? ?Chief Complaint  ?Patient presents with  ? Follow-up  ?  Follow up.   ? ? ?Referring provider: ?[name], MD ? ?HPI: ?81 year old female, former remote smoker followed for complex sleep apnea.  She is a patient of Dr. Juanetta Gosling and last seen in office 07/28/2021.  Past medical history significant for CHF, CAD s/p CABG, PAF on Eliquis, GERD, OA, HLD, history of left breast cancer. ? ?TEST/EVENTS:  ?08/16/2021 HST: AHI 47.4, SPO2 low 72%.  Severe obstructive sleep apnea ?06/16/2021 CXR 2 view: Stable changes status post CABG.  Atherosclerosis and tortuosity with stable mediastinum.  Mild enlargement of the heart.  There is mild central vascular prominence and lower zonal interstitial consolidation, most likely due to interstitial edema.  Trace bilateral pleural effusions.  Remaining lungs are clear. ? ?07/28/2021: OV with Dr. Halford Chessman for consult.  Previously seen with neurology and found to have complex sleep apnea.  Required BiPAP therapy.  Had trouble getting the pressure setting too high, mask leaks and mask fit problems.  Never tried on supplemental O2 with PAP therapy.  Has not used any therapy in the past couple years.  Reported that she was having low energy and daytime fatigue symptoms.  Also reported continued snoring at night.  HST ordered for further evaluation. ? ?08/24/2021: Today-follow-up ?Patient presents today to go over home sleep study results which showed severe obstructive sleep apnea.  She has tried CPAP in the past and was poorly controlled so was transition to BiPAP therapy in 2021.  Used BiPAP for around a year but was unable to tolerate the pressures.  Felt like they were too high.  She did try multiple different masks, which did not seem to help.  She is interested in the inspire device but also is unsure if she wants to move forward with surgery or if she wants to try back on CPAP or BiPAP.  Has ongoing daytime  fatigue symptoms.  Denies morning headaches, narcolepsy or cataplexy.  Denies drowsy driving.  She also notes that she has had progressive DOE and a chronic cough over the past year.  Cough is primarily dry, sounds congested at times.  Was recently diagnosed with heart failure and is currently under treatment with cardiology.  No pulmonary history and no significant smoking history.  She denies any wheezing, hemoptysis, weight loss, anorexia.  Does have some allergy symptoms; feels like cough flare some during allergy season.  She takes Human resources officer daily.  Has never tried any inhaler therapies.  Denies any recent worsening symptoms, palpitations, lower extremity edema, orthopnea.   ? ?Allergies  ?Allergen Reactions  ? Atenolol Other (See Comments)  ? Gadolinium Nausea Only and Other (See Comments)  ?   Desc: PATIENT FELT DIZZY AND NAUSEOUS AFTER GAD INJECTION. NO VOMITING,NO HIVES, NO RASH., Onset Date: [id number] ?  ? Sulfonamide Derivatives Hives  ? ? ?Immunization History  ?Administered Date(s) Administered  ? Influenza Split 01/11/2010, 02/12/2010, 03/09/2011, 01/20/2012, 03/01/2013, 03/17/2014  ? Influenza, High Dose Seasonal PF 03/17/2014  ? Influenza, Quadrivalent, Recombinant, Inj, Pf 12/04/2017, 11/28/2018  ? Influenza,inj,Quad PF,6+ Mos 03/29/2016  ? Influenza,inj,quad, With Preservative 12/11/2018  ? Influenza-Unspecified 03/02/2017, 01/23/2018, 11/28/2018  ? PFIZER(Purple Top)SARS-COV-2 Vaccination 05/01/2019, 05/22/2019, 06/12/2020  ? Pneumococcal Conjugate-13 10/04/2013  ? Zoster Recombinat (Shingrix) 06/12/2020, 11/10/2020  ? Zoster, Live 09/25/2007  ? ? ?Past Medical History:  ?Diagnosis Date  ? Arthritis   ? Atrial  fibrillation ([place])   ? not symptomatic -on metoprolol BID for this   ? CAD (coronary artery disease)   ? s/p [facility] 2004 at [facility]  ? Cancer [facility]) 1998  ? breast cancer--left  ? Dysrhythmia   ? atrial flutter/fib  ? GERD (gastroesophageal reflux disease)   ? Hypertension   ? Osteopenia   ? Personal  history of chemotherapy 1998  ? Personal history of radiation therapy 1998  ? PONV (postoperative nausea and vomiting)   ? Shortness of breath   ? exertion  ? STD (sexually transmitted disease)   ? HSV  ? Urinary incontinence   ? ? ?Tobacco History: ?Social History  ? ?Tobacco Use  ?Smoking Status Former  ?Smokeless Tobacco Never  ?Tobacco Comments  ? smoked in college   ? ?Counseling given: Not Answered ?Tobacco comments: smoked in college  ? ? ?Outpatient Medications Prior to Visit  ?Medication Sig Dispense Refill  ? acetaminophen (TYLENOL) 650 MG CR tablet Take 1,300 mg by mouth 2 (two) times daily as needed for pain.    ? alendronate (FOSAMAX) 70 MG tablet Take 70 mg by mouth once a week. Take with a full glass of water on an empty stomach.    ? apixaban (ELIQUIS) 5 MG TABS tablet Take 1 tablet (5 mg total) by mouth 2 (two) times daily. 60 tablet 5  ? atorvastatin (LIPITOR) 80 MG tablet TAKE 1 TABLET BY MOUTH EVERY DAY 90 tablet 3  ? Calcium Carbonate Antacid (TUMS PO) Take 1 tablet by mouth daily.    ? dronedarone (MULTAQ) 400 MG tablet Take 1 tablet (400 mg total) by mouth 2 (two) times daily with a meal. 60 tablet 3  ? ezetimibe (ZETIA) 10 MG tablet TAKE 1 TABLET BY MOUTH EVERY DAY 90 tablet 3  ? fexofenadine (ALLEGRA) 180 MG tablet Take 180 mg by mouth daily as needed.     ? furosemide (LASIX) 40 MG tablet Take 1.5 tablets (60 mg total) by mouth every other day. 135 tablet 3  ? Magnesium 250 MG TABS Take 250 mg by mouth daily as needed (LEG CRAMPS). Reported on 05/01/2015    ? meclizine (ANTIVERT) 12.5 MG tablet Take 12.5 mg by mouth 3 (three) times daily as needed for dizziness.    ? metoprolol tartrate (LOPRESSOR) 50 MG tablet Take 1.5 tablets (75 mg total) by mouth 2 (two) times daily. 135 tablet 3  ? mometasone (NASONEX) 50 MCG/ACT nasal spray Place 2 sprays into the nose daily.    ? nitroGLYCERIN (NITROSTAT) 0.4 MG SL tablet PLACE 1 TABLET UNDER THE TONGUE EVERY 5 MINUTES AS NEEDED FOR CHEST PAIN. KEEP  UPCOMING APPT 25 tablet 2  ? nystatin ointment (MYCOSTATIN) 1 application    ? omeprazole (PRILOSEC) 40 MG capsule Take 40 mg by mouth daily.  3  ? potassium chloride SA (KLOR-CON M) 20 MEQ tablet Take 1 tablet (20 mEq total) by mouth 2 (two) times daily. 180 tablet 3  ? solifenacin (VESICARE) 5 MG tablet One daily as needed 90 tablet 3  ? triamterene-hydrochlorothiazide (MAXZIDE-25) 37.5-25 MG tablet Take 1 tablet by mouth daily.    ? valACYclovir (VALTREX) 500 MG tablet Take 1 tablet (500 mg total) by mouth daily. Take one tablet by mouth twice a day for 3 days as needed for an outbreak. 120 tablet 3  ? terconazole (TERAZOL 3) 0.8 % vaginal cream Place 1 applicator vaginally at bedtime. Use for 3 nights in a row. (Patient not taking: Reported on 08/24/2021) 20  g 0  ? diltiazem (CARDIZEM CD) 120 MG 24 hr capsule Take 1 capsule (120 mg total) by mouth daily. (Patient not taking: Reported on 07/28/2021) 90 capsule 3  ? ?No facility-administered medications prior to visit.  ? ? ? ?Review of Systems:  ? ?Constitutional: No weight loss or gain, night sweats, fevers, chills. +excessive daytime fatigue ?HEENT: No headaches, difficulty swallowing, tooth/dental problems, or sore throat. No itching, ear ache. + Occasional sneeze, occasional nasal congestion ?CV:  No chest pain, orthopnea, PND, swelling in lower extremities, anasarca, dizziness, palpitations, syncope ?Resp: +shortness of breath with exertion; nonproductive, congested cough (chronic). No excess mucus or change in color of mucus. No hemoptysis. No wheezing.  No chest wall deformity ?GI:  No heartburn, indigestion, abdominal pain, nausea, vomiting, diarrhea, change in bowel habits, loss of appetite, bloody stools.  ?GU: No dysuria, change in color of urine, urgency or frequency.  No flank pain, no hematuria  ?Skin: No rash, lesions, ulcerations ?MSK:  No joint pain or swelling.  No decreased range of motion.  No back pain. ?Neuro: No dizziness or lightheadedness.   ?Psych: No depression or anxiety. Mood stable.  ? ? ? ?Physical Exam: ? ?BP 120/64 (BP Location: Right Arm, Patient Position: Sitting, Cuff Size: Normal)   Pulse 60   Temp 97.9 ?F (36.6 ?C) (Oral)

## 2021-08-24 NOTE — Assessment & Plan Note (Addendum)
Appears compensated on current regimen.  Followed by cardiology.  Contributing factor to DOE and cough. ?

## 2021-08-24 NOTE — Telephone Encounter (Signed)
Agree   Sleep apnea needs to be treated   ?

## 2021-08-24 NOTE — Patient Instructions (Addendum)
Continue Allegra 170 mg daily for allergies  ?Restart nasonex nasal spray 2 sprays each nostril daily for allergies ?Continue omeprazole 40 mg daily for reflux ? ?Trial Albuterol inhaler 2 puffs every 6 hours as needed for shortness of breath or wheezing.  ? ?CPAP titration study ordered - if you decide you would like to move forward with Inspire device instead, please let me know so I can send the referral to ENT and cancel titration study. ? ?Pulmonary function testing scheduled today. ? ?Follow up after PFTs with Dr. Halford Chessman or Alanson Aly. If symptoms do not improve or worsen, please contact office for sooner follow up or seek emergency care. ?

## 2021-08-24 NOTE — Progress Notes (Signed)
Reviewed and agree with assessment/plan. ? ? ?Chesley Mires, MD ?[place] ?08/24/2021, 1:14 PM ?Pager:  [phone] ? ?

## 2021-08-24 NOTE — Assessment & Plan Note (Addendum)
Progressive DOE and chronic cough.  Suspect multifactorial related to untreated severe OSA, CHF and deconditioning.  Will obtain PFTs for further evaluation.  Trial of albuterol. ?

## 2021-08-24 NOTE — Telephone Encounter (Signed)
MyChart message sent to the pt.

## 2021-08-30 NOTE — Progress Notes (Unsigned)
Cardiology Office Note   Date:  08/31/2021   ID:  Mallory, Watts April 19, 1940, MRN [id number]  PCP:  [name], MD  Cardiologist:   [name], MD   F/U of CAD and PAF  History of Present Illness: Mallory Watts is a 81 y.o. female with a history of CAD  She is status post CABG in 2004.     The patient also had a monitor which showed paroxysmal atrial fib/atrial  She has been maintained on Xarelto   She was switched to Eliquis because of hematuria. The pt also has a hx of central sleep apnea   She has been followed by neuro    I saw the pt in Oct 2022 Stress test in March 2023 showed no ischemia  LVEF 47% though visually better   Low risk The pt wore a 14 day monitor in APril 2023   This showed atrial fibrillation    100% time   HR ranges 44 to 187 bpm   Average HR 79 bpm     I spoke with the pt on the phone   Complained of feeling fatigue   Based on this I recomm starting Multaq bid   The pt started taking at the end April      Yesterday the pt says she felt different    She could breathe better   Not fatigued      Current Meds  Medication Sig   acetaminophen (TYLENOL) 650 MG CR tablet Take 1,300 mg by mouth 2 (two) times daily as needed for pain.   albuterol (VENTOLIN HFA) 108 (90 Base) MCG/ACT inhaler Inhale 2 puffs into the lungs every 6 (six) hours as needed for wheezing or shortness of breath.   alendronate (FOSAMAX) 70 MG tablet Take 70 mg by mouth once a week. Take with a full glass of water on an empty stomach.   apixaban (ELIQUIS) 5 MG TABS tablet Take 1 tablet (5 mg total) by mouth 2 (two) times daily.   atorvastatin (LIPITOR) 80 MG tablet TAKE 1 TABLET BY MOUTH EVERY DAY   Calcium Carbonate Antacid (TUMS PO) Take 1 tablet by mouth daily.   dronedarone (MULTAQ) 400 MG tablet Take 1 tablet (400 mg total) by mouth 2 (two) times daily with a meal.   ezetimibe (ZETIA) 10 MG tablet TAKE 1 TABLET BY MOUTH EVERY DAY   fexofenadine (ALLEGRA) 180 MG tablet Take 180 mg  by mouth daily as needed.    furosemide (LASIX) 40 MG tablet Take 1.5 tablets (60 mg total) by mouth every other day.   Magnesium 250 MG TABS Take 250 mg by mouth daily as needed (LEG CRAMPS). Reported on 05/01/2015   meclizine (ANTIVERT) 12.5 MG tablet Take 12.5 mg by mouth 3 (three) times daily as needed for dizziness.   metoprolol tartrate (LOPRESSOR) 50 MG tablet Take 1.5 tablets (75 mg total) by mouth 2 (two) times daily.   mometasone (NASONEX) 50 MCG/ACT nasal spray Place 2 sprays into the nose daily.   nitroGLYCERIN (NITROSTAT) 0.4 MG SL tablet PLACE 1 TABLET UNDER THE TONGUE EVERY 5 MINUTES AS NEEDED FOR CHEST PAIN. KEEP UPCOMING APPT   nystatin ointment (MYCOSTATIN) 1 application   omeprazole (PRILOSEC) 40 MG capsule Take 40 mg by mouth daily.   potassium chloride SA (KLOR-CON M) 20 MEQ tablet Take 1 tablet (20 mEq total) by mouth 2 (two) times daily.   solifenacin (VESICARE) 5 MG tablet One daily as needed   triamterene-hydrochlorothiazide (MAXZIDE-25) 37.5-25  MG tablet Take 1 tablet by mouth daily.   valACYclovir (VALTREX) 500 MG tablet Take 1 tablet (500 mg total) by mouth daily. Take one tablet by mouth twice a day for 3 days as needed for an outbreak.     Allergies:   Atenolol, Gadolinium, and Sulfonamide derivatives   Past Medical History:  Diagnosis Date   Arthritis    Atrial fibrillation ([place])    not symptomatic -on metoprolol BID for this    CAD (coronary artery disease)    s/p CAGB 2004 at [facility]) 1998   breast cancer--left   Dysrhythmia    atrial flutter/fib   GERD (gastroesophageal reflux disease)    Hypertension    Osteopenia    Personal history of chemotherapy 1998   Personal history of radiation therapy 1998   PONV (postoperative nausea and vomiting)    Shortness of breath    exertion   STD (sexually transmitted disease)    HSV   Urinary incontinence     Past Surgical History:  Procedure Laterality Date   ANTERIOR CERVICAL  DECOMP/DISCECTOMY FUSION N/A 01/14/2014   Procedure: ANTERIOR CERVICAL DECOMPRESSION/DISCECTOMY FUSION CERVICAL FOUR-FIVE,CERVICAL FIVE SIX;  Surgeon: [name], MD;  Location: [facility];  Service: Neurosurgery;  Laterality: N/A;   BREAST EXCISIONAL BIOPSY Left    BREAST LUMPECTOMY Left [place]   left lumpectomy for breast ca   COLONOSCOPY     CORONARY ARTERY BYPASS GRAFT  2004   at [place]  2006   heart bypass  2003   -single   POLYPECTOMY     TONSILLECTOMY     TOTAL KNEE ARTHROPLASTY Left 11/28/2016   Procedure: LEFT TOTAL KNEE ARTHROPLASTY;  Surgeon: [name], MD;  Location: [facility] OR[facility];  Service: Orthopedics;  Laterality: Left;  Canal block     Social History:  The patient  reports that she has quit smoking. She has never used smokeless tobacco. She reports current alcohol use of about 1.0 standard drink per week. She reports that she does not use drugs.   Family History:  The patient's family history includes Diabetes in her brother and mother; Stroke in her father and mother.    ROS:  Please see the history of present illness. All other systems are reviewed and  Negative to the above problem except as noted.    PHYSICAL EXAM: VS:  BP 136/76   Pulse 66   Ht '5\' 2"'$  (1.575 m)   Wt 176 lb 6.4 oz (80 kg)   LMP 04/11/1994   SpO2 97%   BMI 32.26 kg/m   GEN: Well nourished, well developed, in no acute distress  HEENT: normal  Neck: no JVD   No carotid bruits    Cardiac: RRR  No S3     No LE edema Respiratory:  clear to auscultation bilaterally, GI: soft, nontender, nondistended, + BS  No hepatomegaly  MS: no deformity Moving all extremities   Skin: warm and dry, no rash Neuro:  Strength and sensation are intact Psych: euthymic mood, full affect   EKG:  EKG is ordered today.  SR 66 bpm    Echo   AUg 2022  Left ventricular ejection fraction, by estimation, is 55 to 60%. Left ventricular ejection fraction by 3D  volume is 56 %. The left ventricle has normal function. The left ventricle has no regional wall motion abnormalities. There is mild left ventricular hypertrophy. Left ventricular  diastolic parameters are consistent with Grade III diastolic dysfunction (restrictive). Elevated left atrial pressure. The average left ventricular global longitudinal strain is -17.4 %. 1. Right ventricular systolic function is normal. The right ventricular size is mildly enlarged. There is mildly elevated pulmonary artery systolic pressure. The estimated right ventricular systolic pressure is 28.7 mmHg. 2. The mitral valve is degenerative. Trivial mitral valve regurgitation. No evidence of mitral stenosis. Moderate mitral annular calcification. 3. 4. Tricuspid valve regurgitation is mild to moderate. The aortic valve is tricuspid. Aortic valve regurgitation is mild to moderate. Mild to moderate aortic valve sclerosis/calcification is present, without any evidence of aortic stenosis. 5. The inferior vena cava is dilated in size with >50% respiratory variability, suggesting right atrial pressure of 8 mmHg. 6. Comparison(s): 04/20/18 EF 60-65%. PA pressure 42mHg.  Lipid Panel No results found for: CHOL, TRIG, HDL, CHOLHDL, VLDL, LDLCALC, LDLDIRECT    Wt Readings from Last 3 Encounters:  08/31/21 176 lb 6.4 oz (80 kg)  08/24/21 170 lb 9.6 oz (77.4 kg)  07/28/21 168 lb 3.2 oz (76.3 kg)      ASSESSMENT AND PLAN:  1  PAF   Recent monitor showed that actually she was in afib continuously   Did not feel good   She was started on Multaq    It sounds like yesterday she may have converted    Currently in SR I would keep on current regimen    Follow   2.  CAD.  Patient has remote CABG in 2004. Recent stress test shows normal perfusion   NO ischemia   3  Aortic insufficiency. Echo as noted above   No murmur on exam  FOllow with periodic echooes    4dyslipidemia Wil recheck lipids    5.  Sleep apnea  Pt did  not tolerate CPAP   Current medicines are reviewed at length with the patient today.  The patient does not have concerns regarding medicines.  Signed, PDorris Carnes MD  08/31/2021 5:56 PM    [place]  [id number]: [phone] Fax: [phone]

## 2021-08-31 ENCOUNTER — Ambulatory Visit: Payer: Medicare Other

## 2021-08-31 ENCOUNTER — Encounter: Payer: Self-pay | Admitting: Internal Medicine

## 2021-08-31 ENCOUNTER — Ambulatory Visit: Payer: Medicare Other | Admitting: Internal Medicine

## 2021-08-31 VITALS — BP 136/76 | HR 66 | Ht 62.0 in | Wt 176.4 lb

## 2021-08-31 DIAGNOSIS — E785 Hyperlipidemia, unspecified: Secondary | ICD-10-CM

## 2021-08-31 DIAGNOSIS — Z79899 Other long term (current) drug therapy: Secondary | ICD-10-CM

## 2021-08-31 DIAGNOSIS — I2510 Atherosclerotic heart disease of native coronary artery without angina pectoris: Secondary | ICD-10-CM | POA: Diagnosis not present

## 2021-08-31 LAB — BASIC METABOLIC PANEL
BUN/Creatinine Ratio: 17 (ref 12–28)
BUN: 18 mg/dL (ref 8–27)
CO2: 24 mmol/L (ref 20–29)
Calcium: 9.2 mg/dL (ref 8.7–10.3)
Chloride: 103 mmol/L (ref 96–106)
Creatinine, Ser: 1.08 mg/dL — ABNORMAL HIGH (ref 0.57–1.00)
Glucose: 91 mg/dL (ref 70–99)
Potassium: 4.3 mmol/L (ref 3.5–5.2)
Sodium: 140 mmol/L (ref 134–144)
eGFR: 53 mL/min/{1.73_m2} — ABNORMAL LOW (ref >59)

## 2021-08-31 LAB — LIPID PANEL
Chol/HDL Ratio: 2.9 ratio (ref 0.0–4.4)
Cholesterol, Total: 115 mg/dL (ref 100–199)
HDL: 40 mg/dL (ref >39)
LDL Chol Calc (NIH): 55 mg/dL (ref 0–99)
Triglycerides: 111 mg/dL (ref 0–149)
VLDL Cholesterol Cal: 20 mg/dL (ref 5–40)

## 2021-08-31 NOTE — Patient Instructions (Signed)
Medication Instructions:   *If you need a refill on your cardiac medications before your next appointment, please call your pharmacy*   Lab Work: BMET and LIPID  If you have labs (blood work) drawn today and your tests are completely normal, you will receive your results only by: [place] (if you have MyChart) OR A paper copy in the mail If you have any lab test that is abnormal or we need to change your treatment, we will call you to review the results.   Testing/Procedures: none   Follow-Up: At [facility], you and your health needs are our priority.  As part of our continuing mission to provide you with exceptional heart care, we have created designated Provider Care Teams.  These Care Teams include your primary Cardiologist (physician) and Advanced Practice Providers (APPs -  Physician Assistants and Nurse Practitioners) who all work together to provide you with the care you need, when you need it.  We recommend signing up for the patient portal called "MyChart".  Sign up information is provided on this After Visit Summary.  MyChart is used to connect with patients for Virtual Visits (Telemedicine).  Patients are able to view lab/test results, encounter notes, upcoming appointments, etc.  Non-urgent messages can be sent to your provider as well.   To learn more about what you can do with MyChart, go to [email or web address].     Important Information About Sugar

## 2021-09-02 ENCOUNTER — Encounter: Payer: Self-pay | Admitting: Internal Medicine

## 2021-09-03 NOTE — Telephone Encounter (Signed)
Spoke to patient   [facility] may go in/out of afib   It would be important to document She has found Kardia device and will use, keep log, call back

## 2021-09-26 ENCOUNTER — Encounter (HOSPITAL_BASED_OUTPATIENT_CLINIC_OR_DEPARTMENT_OTHER): Payer: Medicare Other | Admitting: Pulmonary Disease

## 2021-10-01 ENCOUNTER — Encounter (HOSPITAL_BASED_OUTPATIENT_CLINIC_OR_DEPARTMENT_OTHER): Payer: Medicare Other | Admitting: Pulmonary Disease

## 2021-10-08 ENCOUNTER — Encounter: Payer: Self-pay | Admitting: Nurse Practitioner

## 2021-10-08 ENCOUNTER — Ambulatory Visit (INDEPENDENT_AMBULATORY_CARE_PROVIDER_SITE_OTHER): Payer: Medicare Other | Admitting: Pulmonary Disease

## 2021-10-08 ENCOUNTER — Ambulatory Visit: Payer: Medicare Other | Admitting: Nurse Practitioner

## 2021-10-08 VITALS — BP 118/62 | HR 73 | Temp 98.1°F | Ht 62.0 in | Wt 172.8 lb

## 2021-10-08 DIAGNOSIS — I509 Heart failure, unspecified: Secondary | ICD-10-CM

## 2021-10-08 DIAGNOSIS — G4733 Obstructive sleep apnea (adult) (pediatric): Secondary | ICD-10-CM

## 2021-10-08 DIAGNOSIS — R0609 Other forms of dyspnea: Secondary | ICD-10-CM

## 2021-10-08 DIAGNOSIS — R053 Chronic cough: Secondary | ICD-10-CM

## 2021-10-08 LAB — PULMONARY FUNCTION TEST
DL/VA % pred: 72 %
DL/VA: 2.99 ml/min/mmHg/L
DLCO cor % pred: 62 %
DLCO cor: 11.05 ml/min/mmHg
DLCO unc % pred: 62 %
DLCO unc: 11.05 ml/min/mmHg
FEF 25-75 Post: 2.37 L/sec
FEF 25-75 Pre: 2.24 L/sec
FEF2575-%Change-Post: 5 %
FEF2575-%Pred-Post: 169 %
FEF2575-%Pred-Pre: 160 %
FEV1-%Change-Post: 2 %
FEV1-%Pred-Post: 110 %
FEV1-%Pred-Pre: 107 %
FEV1-Post: 2.01 L
FEV1-Pre: 1.96 L
FEV1FVC-%Change-Post: 7 %
FEV1FVC-%Pred-Pre: 112 %
FEV6-%Change-Post: -2 %
FEV6-%Pred-Post: 96 %
FEV6-%Pred-Pre: 98 %
FEV6-Post: 2.24 L
FEV6-Pre: 2.3 L
FEV6FVC-%Change-Post: 0 %
FEV6FVC-%Pred-Post: 105 %
FEV6FVC-%Pred-Pre: 105 %
FVC-%Change-Post: -4 %
FVC-%Pred-Post: 91 %
FVC-%Pred-Pre: 95 %
FVC-Post: 2.24 L
FVC-Pre: 2.35 L
Post FEV1/FVC ratio: 89 %
Post FEV6/FVC ratio: 100 %
Pre FEV1/FVC ratio: 83 %
Pre FEV6/FVC Ratio: 100 %
RV % pred: 95 %
RV: 2.16 L
TLC % pred: 95 %
TLC: 4.54 L

## 2021-10-08 NOTE — Progress Notes (Signed)
$'@Patient'K$  ID: Mallory Watts, female    DOB: 05/11/1940, 81 y.o.   MRN: [id number]  Chief Complaint  Patient presents with   Follow-up    Patient here to go over pft results and to see if she is eligible for inspire.     Referring provider: [name], MD  HPI: 81 year old female, former remote smoker followed for complex sleep apnea.  She is a patient of Dr. Juanetta Gosling and last seen in office 08/24/2021.  Past medical history significant for CHF, CAD s/p CABG, PAF on Eliquis, GERD, OA, HLD, history of left breast cancer.  TEST/EVENTS:  08/16/2021 HST: AHI 47.4, SPO2 low 72%.  Severe obstructive sleep apnea 06/16/2021 CXR 2 view: Stable changes status post CABG.  Atherosclerosis and tortuosity with stable mediastinum.  Mild enlargement of the heart.  There is mild central vascular prominence and lower zonal interstitial consolidation, most likely due to interstitial edema.  Trace bilateral pleural effusions.  Remaining lungs are clear.  07/28/2021: OV with Dr. Halford Chessman for consult.  Previously seen with neurology and found to have complex sleep apnea.  Required BiPAP therapy.  Had trouble getting the pressure setting too high, mask leaks and mask fit problems.  Never tried on supplemental O2 with PAP therapy.  Has not used any therapy in the past couple years.  Reported that she was having low energy and daytime fatigue symptoms.  Also reported continued snoring at night.  HST ordered for further evaluation.  08/24/2021: OV with Dailan Pfalzgraf NP to go over home sleep study results which showed severe obstructive sleep apnea.  She has tried CPAP in the past and was poorly controlled so was transition to BiPAP therapy in 2021.  Used BiPAP for around a year but was unable to tolerate the pressures.  Felt like they were too high.  She did try multiple different masks, which did not seem to help.  She is interested in the inspire device but also is unsure if she wants to move forward with surgery or if she wants to try  back on CPAP or BiPAP.  Has ongoing daytime fatigue symptoms.  Denies morning headaches, narcolepsy or cataplexy.  Denies drowsy driving.  She was unsure if she wanted to go through with [facility] but wanted to consider it. In the meantime, recommended scheduling her for CPAP titration to see if we could find CPAP or BiPAP settings she could tolerate.  She also notes that she has had progressive DOE and a chronic cough over the past year.  Cough is primarily dry, sounds congested at times.  Was recently diagnosed with heart failure and is currently under treatment with cardiology.  No pulmonary history and no significant smoking history.  She denies any wheezing, hemoptysis, weight loss, anorexia.  Does have some allergy symptoms; feels like cough flare some during allergy season.  She takes Human resources officer daily.  Has never tried any inhaler therapies.  Denies any recent worsening symptoms, palpitations, lower extremity edema, orthopnea.  Provided with trial of albuterol and scheduled PFTs.  10/08/2021: Today - follow up Patient presents today for follow-up after pulmonary function testing.  PFTs showed normal spirometry and lung volumes.  She had a moderate diffusion defect with DLCO of 62% corrected for alveolar volume.  Today, she reports that she continues to have DOE and a chronic dry cough.  It is unchanged when compared to when I saw her last.  She does feel like the albuterol helps with her shortness of breath at times.  She  has only had to use it a few times since prescribed.  Otherwise, she reports that she has had some intermittent swelling in her lower extremities.  She does have some PND and orthopnea as well. She has not had any frothy sputum, wheezing, hemoptysis, weight loss, anorexia, palpitations. She was recently diagnosed in April with diastolic HF and is followed by cardiology; not on any diuretic therapy.   She has yet to undergo CPAP titration - scheduled for next month. She denies any drowsy  driving.   Allergies  Allergen Reactions   Atenolol Other (See Comments)   Gadolinium Nausea Only and Other (See Comments)     Desc: PATIENT FELT DIZZY AND NAUSEOUS AFTER GAD INJECTION. NO VOMITING,NO HIVES, NO RASH., Onset Date: [id number]    Sulfonamide Derivatives Hives    Immunization History  Administered Date(s) Administered   Influenza Split 01/11/2010, 02/12/2010, 03/09/2011, 01/20/2012, 03/01/2013, 03/17/2014   Influenza, High Dose Seasonal PF 03/17/2014   Influenza, Quadrivalent, Recombinant, Inj, Pf 12/04/2017, 11/28/2018   Influenza,inj,Quad PF,6+ Mos 03/29/2016   Influenza,inj,quad, With Preservative 12/11/2018   Influenza-Unspecified 03/02/2017, 01/23/2018, 11/28/2018   PFIZER(Purple Top)SARS-COV-2 Vaccination 05/01/2019, 05/22/2019, 06/12/2020   Pneumococcal Conjugate-13 10/04/2013   Zoster Recombinat (Shingrix) 06/12/2020, 11/10/2020   Zoster, Live 09/25/2007    Past Medical History:  Diagnosis Date   Arthritis    Atrial fibrillation (HCC)    not symptomatic -on metoprolol BID for this    CAD (coronary artery disease)    s/p CAGB 2004 at [facility]) 1998   breast cancer--left   Dysrhythmia    atrial flutter/fib   GERD (gastroesophageal reflux disease)    Hypertension    Osteopenia    Personal history of chemotherapy 1998   Personal history of radiation therapy 1998   PONV (postoperative nausea and vomiting)    Shortness of breath    exertion   STD (sexually transmitted disease)    HSV   Urinary incontinence     Tobacco History: Social History   Tobacco Use  Smoking Status Former  Smokeless Tobacco Never  Tobacco Comments   smoked in college    Counseling given: Not Answered Tobacco comments: smoked in college    Outpatient Medications Prior to Visit  Medication Sig Dispense Refill   acetaminophen (TYLENOL) 650 MG CR tablet Take 1,300 mg by mouth 2 (two) times daily as needed for pain.     albuterol (VENTOLIN HFA) 108 (90 Base)  MCG/ACT inhaler Inhale 2 puffs into the lungs every 6 (six) hours as needed for wheezing or shortness of breath. 8 g 6   alendronate (FOSAMAX) 70 MG tablet Take 70 mg by mouth once a week. Take with a full glass of water on an empty stomach.     apixaban (ELIQUIS) 5 MG TABS tablet Take 1 tablet (5 mg total) by mouth 2 (two) times daily. 60 tablet 5   atorvastatin (LIPITOR) 80 MG tablet TAKE 1 TABLET BY MOUTH EVERY DAY 90 tablet 3   Calcium Carbonate Antacid (TUMS PO) Take 1 tablet by mouth daily.     dronedarone (MULTAQ) 400 MG tablet Take 1 tablet (400 mg total) by mouth 2 (two) times daily with a meal. 60 tablet 3   ezetimibe (ZETIA) 10 MG tablet TAKE 1 TABLET BY MOUTH EVERY DAY 90 tablet 3   fexofenadine (ALLEGRA) 180 MG tablet Take 180 mg by mouth daily as needed.      furosemide (LASIX) 40 MG tablet Take 1.5 tablets (60 mg total)  by mouth every other day. 135 tablet 3   Magnesium 250 MG TABS Take 250 mg by mouth daily as needed (LEG CRAMPS). Reported on 05/01/2015     meclizine (ANTIVERT) 12.5 MG tablet Take 12.5 mg by mouth 3 (three) times daily as needed for dizziness.     metoprolol tartrate (LOPRESSOR) 50 MG tablet Take 1.5 tablets (75 mg total) by mouth 2 (two) times daily. 135 tablet 3   mometasone (NASONEX) 50 MCG/ACT nasal spray Place 2 sprays into the nose daily. 1 each 5   nitroGLYCERIN (NITROSTAT) 0.4 MG SL tablet PLACE 1 TABLET UNDER THE TONGUE EVERY 5 MINUTES AS NEEDED FOR CHEST PAIN. KEEP UPCOMING APPT 25 tablet 2   nystatin ointment (MYCOSTATIN) 1 application     omeprazole (PRILOSEC) 40 MG capsule Take 40 mg by mouth daily.  3   potassium chloride SA (KLOR-CON M) 20 MEQ tablet Take 1 tablet (20 mEq total) by mouth 2 (two) times daily. 180 tablet 3   solifenacin (VESICARE) 5 MG tablet One daily as needed 90 tablet 3   triamterene-hydrochlorothiazide (MAXZIDE-25) 37.5-25 MG tablet Take 1 tablet by mouth daily.     valACYclovir (VALTREX) 500 MG tablet Take 1 tablet (500 mg total)  by mouth daily. Take one tablet by mouth twice a day for 3 days as needed for an outbreak. 120 tablet 3   No facility-administered medications prior to visit.     Review of Systems:   Constitutional: No weight loss or gain, night sweats, fevers, chills. +excessive daytime fatigue HEENT: No headaches, difficulty swallowing, tooth/dental problems, or sore throat. No itching, ear ache. + Occasional sneeze, occasional nasal congestion (baseline) CV:  +orthopnea, PND, swelling in lower extremities. No chest pain, anasarca, dizziness, palpitations, syncope Resp: +shortness of breath with exertion; dry cough (chronic). No excess mucus or change in color of mucus. No hemoptysis. No wheezing.  No chest wall deformity GI:  No heartburn, indigestion, abdominal pain, nausea, vomiting, diarrhea, change in bowel habits, loss of appetite, bloody stools.  GU: No dysuria, change in color of urine, urgency or frequency.  No flank pain, no hematuria  Skin: No rash, lesions, ulcerations MSK:  No joint pain or swelling.  No decreased range of motion.  No back pain. Neuro: No dizziness or lightheadedness.  Psych: No depression or anxiety. Mood stable.     Physical Exam:  BP 118/62 (BP Location: Right Arm, Patient Position: Sitting, Cuff Size: Normal)   Pulse 73   Temp 98.1 F (36.7 C) (Oral)   Ht '5\' 2"'$  (1.575 m)   Wt 172 lb 12.8 oz (78.4 kg)   LMP 04/11/1994   SpO2 96%   BMI 31.61 kg/m   GEN: Pleasant, interactive, well-appearing; obese; in no acute distress. HEENT:  Normocephalic and atraumatic. PERRLA. Sclera white. Nasal turbinates pink, moist and patent bilaterally. No rhinorrhea present. Oropharynx pink and moist, without exudate or edema. No lesions, ulcerations, or postnasal drip.  NECK:  Supple w/ fair ROM. No JVD present. Normal carotid impulses w/o bruits. Thyroid symmetrical with no goiter or nodules palpated. No lymphadenopathy.   CV: RRR, no m/r/g, +1 pitting BLE edema. Pulses intact, +2  bilaterally. No cyanosis, pallor or clubbing. PULMONARY:  Unlabored, regular breathing. Clear bilaterally A&P w/o wheezes/rales/rhonchi. No accessory muscle use. No dullness to percussion. GI: BS present and normoactive. Soft, non-tender to palpation.  MSK: No erythema, warmth or tenderness. Cap refil <2 sec all extrem. No deformities or joint swelling noted.  Neuro: A/Ox3. No focal deficits  noted.   Skin: Warm, no lesions or rashe Psych: Normal affect and behavior. Judgement and thought content appropriate.     Lab Results:  CBC    Component Value Date/Time   WBC 9.0 06/16/2021 0551   RBC 4.62 06/16/2021 0551   HGB 13.3 06/16/2021 0551   HGB 14.5 10/19/2018 0924   HGB 13.0 08/10/2010 1412   HCT 41.9 06/16/2021 0551   HCT 44.1 10/19/2018 0924   HCT 38.3 08/10/2010 1412   PLT 243 06/16/2021 0551   PLT 240 10/19/2018 0924   MCV 90.7 06/16/2021 0551   MCV 88 10/19/2018 0924   MCV 94.8 08/10/2010 1412   MCH 28.8 06/16/2021 0551   MCHC 31.7 06/16/2021 0551   RDW 14.7 06/16/2021 0551   RDW 14.3 10/19/2018 0924   RDW 13.8 08/10/2010 1412   LYMPHSABS 2.5 06/16/2021 0551   LYMPHSABS 1.9 08/10/2010 1412   MONOABS 1.0 06/16/2021 0551   MONOABS 0.4 08/10/2010 1412   EOSABS 0.1 06/16/2021 0551   EOSABS 0.1 08/10/2010 1412   BASOSABS 0.0 06/16/2021 0551   BASOSABS 0.0 08/10/2010 1412    BMET    Component Value Date/Time   NA 140 08/31/2021 1216   K 4.3 08/31/2021 1216   CL 103 08/31/2021 1216   CO2 24 08/31/2021 1216   GLUCOSE 91 08/31/2021 1216   GLUCOSE 115 (H) 06/16/2021 0551   BUN 18 08/31/2021 1216   CREATININE 1.08 (H) 08/31/2021 1216   CALCIUM 9.2 08/31/2021 1216   GFRNONAA 34 (L) 06/16/2021 0551   GFRAA 48 (L) 10/19/2018 0924    BNP    Component Value Date/Time   BNP 197.6 (H) 06/16/2021 0551     Imaging:  No results found.        Latest Ref Rng & Units 10/08/2021   10:49 AM  PFT Results  FVC-Pre L 2.35  P  FVC-Predicted Pre % 95  P  FVC-Post L  2.24  P  FVC-Predicted Post % 91  P  Pre FEV1/FVC % % 83  P  Post FEV1/FCV % % 89  P  FEV1-Pre L 1.96  P  FEV1-Predicted Pre % 107  P  FEV1-Post L 2.01  P  DLCO uncorrected ml/min/mmHg 11.05  P  DLCO UNC% % 62  P  DLCO corrected ml/min/mmHg 11.05  P  DLCO COR %Predicted % 62  P  DLVA Predicted % 72  P  TLC L 4.54  P  TLC % Predicted % 95  P  RV % Predicted % 95  P    P Preliminary result    No results found for: "NITRICOXIDE"      Assessment & Plan:   DOE (dyspnea on exertion) PFTs showed no underlying restrictive or obstructive defects, and no bronchodilator response.  She did have a reduction in her DLCO to 62% indicating moderate diffusion defect.  I believe her DOE is likely multifactorial related to deconditioning, obesity, CHF; however, I am also concerned that she may have underlying PAH.  Her echocardiogram from 2022 showed moderately elevated PASP.  Her echo from April of 2023 did have a normal PASP but she now has bilateral lower extremity edema, DOE and reduction in DLCO.  She also has severe untreated OSA which puts her at high risk for PAH.  Discussed these concerns with her today.  I am going to follow-up with her cardiologist and see about getting her set up for right heart cath for further evaluation.  Advised that she continue using albuterol  as needed for shortness of breath if she feels like this helps her.  Patient Instructions  Continue Allegra 170 mg daily for allergies  Restart nasonex nasal spray 2 sprays each nostril daily for allergies Continue omeprazole 40 mg daily for reflux Continue Albuterol inhaler 2 puffs every 6 hours as needed for shortness of breath or wheezing.   Referral to ENT for Inspire device consultation if you decide you would like to go this route  Attend CPAP titration on 7/10 as previously scheduled  I am going to send a message to your cardiologist about your pulmonary function testing and leg swelling for possible right heart  catheterization   Follow up after sleep study with Dr. Halford Chessman or Alanson Aly. If symptoms do not improve or worsen, please contact office for sooner follow up or seek emergency care.    Severe obstructive sleep apnea CPAP titration scheduled for 7/10.  She is going to attend this as she is still unsure if she would like to move forward with inspire device.  She did want me to place a referral to ENT today in case she changes her mind.  She is going to go home and do some more research.  We discussed how untreated sleep apnea puts an individual at risk for cardiac arrhthymias, pulm HTN, DM, stroke and increases their risk for daytime accidents.  Educated to be aware of drowsy driving.  CHF (congestive heart failure) (HCC) Mild bilateral lower extremity edema.  No increased DOE from baseline today.  CHF versus PAH.  Follow-up with Dr. Harrington Challenger with cardiology.    I spent 35 minutes of dedicated to the care of this patient on the date of this encounter to include pre-visit review of records, face-to-face time with the patient discussing conditions above, post visit ordering of testing, clinical documentation with the electronic health record, making appropriate referrals as documented, and communicating necessary findings to members of the patients care team.  Clayton Bibles, NP 10/08/2021  Pt aware and understands NP's role.

## 2021-10-08 NOTE — Progress Notes (Signed)
Full PFT performed today. °

## 2021-10-08 NOTE — Assessment & Plan Note (Addendum)
CPAP titration scheduled for 7/10.  She is going to attend this as she is still unsure if she would like to move forward with inspire device.  She did want me to place a referral to ENT today in case she changes her mind.  She is going to go home and do some more research.  We discussed how untreated sleep apnea puts an individual at risk for cardiac arrhthymias, pulm HTN, DM, stroke and increases their risk for daytime accidents.  Educated to be aware of drowsy driving.

## 2021-10-08 NOTE — Assessment & Plan Note (Signed)
PFTs showed no underlying restrictive or obstructive defects, and no bronchodilator response.  She did have a reduction in her DLCO to 62% indicating moderate diffusion defect.  I believe her DOE is likely multifactorial related to deconditioning, obesity, CHF; however, I am also concerned that she may have underlying PAH.  Her echocardiogram from 2022 showed moderately elevated PASP.  Her echo from April of 2023 did have a normal PASP but she now has bilateral lower extremity edema, DOE and reduction in DLCO.  She also has severe untreated OSA which puts her at high risk for PAH.  Discussed these concerns with her today.  I am going to follow-up with her cardiologist and see about getting her set up for right heart cath for further evaluation.  Advised that she continue using albuterol as needed for shortness of breath if she feels like this helps her.  Patient Instructions  Continue Allegra 170 mg daily for allergies  Restart nasonex nasal spray 2 sprays each nostril daily for allergies Continue omeprazole 40 mg daily for reflux Continue Albuterol inhaler 2 puffs every 6 hours as needed for shortness of breath or wheezing.   Referral to ENT for Inspire device consultation if you decide you would like to go this route  Attend CPAP titration on 7/10 as previously scheduled  I am going to send a message to your cardiologist about your pulmonary function testing and leg swelling for possible right heart catheterization   Follow up after sleep study with Dr. Halford Chessman or Alanson Aly. If symptoms do not improve or worsen, please contact office for sooner follow up or seek emergency care.

## 2021-10-08 NOTE — Patient Instructions (Signed)
Full PFT performed today. °

## 2021-10-08 NOTE — Assessment & Plan Note (Signed)
Mild bilateral lower extremity edema.  No increased DOE from baseline today.  CHF versus PAH.  Follow-up with Dr. Harrington Challenger with cardiology.

## 2021-10-08 NOTE — Patient Instructions (Addendum)
Continue Allegra 170 mg daily for allergies  Restart nasonex nasal spray 2 sprays each nostril daily for allergies Continue omeprazole 40 mg daily for reflux Continue Albuterol inhaler 2 puffs every 6 hours as needed for shortness of breath or wheezing.   Referral to ENT for Inspire device consultation if you decide you would like to go this route  Attend CPAP titration on 7/10 as previously scheduled  I am going to send a message to your cardiologist about your pulmonary function testing and leg swelling for possible right heart catheterization   Follow up after sleep study with Dr. Halford Chessman or Alanson Aly. If symptoms do not improve or worsen, please contact office for sooner follow up or seek emergency care.

## 2021-10-10 NOTE — Progress Notes (Signed)
Reviewed and agree with assessment/plan.   Leeba Barbe, MD [facility] 10/10/2021, 10:57 AM Pager:  [phone]  

## 2021-10-18 ENCOUNTER — Ambulatory Visit (HOSPITAL_BASED_OUTPATIENT_CLINIC_OR_DEPARTMENT_OTHER): Payer: Medicare Other | Attending: Pulmonary Disease | Admitting: Pulmonary Disease

## 2021-10-18 DIAGNOSIS — G4733 Obstructive sleep apnea (adult) (pediatric): Secondary | ICD-10-CM | POA: Diagnosis not present

## 2021-10-20 ENCOUNTER — Ambulatory Visit (INDEPENDENT_AMBULATORY_CARE_PROVIDER_SITE_OTHER): Payer: Medicare Other | Admitting: Nurse Practitioner

## 2021-10-20 ENCOUNTER — Telehealth: Payer: Self-pay | Admitting: Pulmonary Disease

## 2021-10-20 DIAGNOSIS — G4733 Obstructive sleep apnea (adult) (pediatric): Secondary | ICD-10-CM

## 2021-10-20 NOTE — Telephone Encounter (Signed)
ATC patient.  LMTCB. 

## 2021-10-20 NOTE — Telephone Encounter (Signed)
Bipap titration 10/18/21 >> Bipap 19/15 cm H2O >> AHI 3.3, +R, +S.   Please let her know she did well with Bipap during her sleep study.  Please send an order to set her up with Bipap 19/15 cm H2O with heated humidity and mask of choice.  She needs follow up in 2 months.

## 2021-10-20 NOTE — Procedures (Signed)
     Patient Name: Mallory Watts, Mallory Watts Date: 10/18/2021 Gender: Female D.O.B: 10/22/1940 Age (years): 15 Referring Provider: Clayton Bibles NP Height (inches): 62 Interpreting Physician: Chesley Mires MD, ABSM Weight (lbs): 170 RPSGT: Zadie Rhine BMI: 31 MRN: [id number] Neck Size: 14.00  CLINICAL INFORMATION The patient is referred for a BiPAP titration to treat sleep apnea.  She had previously tried CPAP therapy, but had difficulty tolerating higher CPAP pressure settings.  Date of HST 08/16/21: AHI 47.4, SpO2 low 72%.  SLEEP STUDY TECHNIQUE As per the AASM Manual for the Scoring of Sleep and Associated Events v2.3 (April 2016) with a hypopnea requiring 4% desaturations.  The channels recorded and monitored were frontal, central and occipital EEG, electrooculogram (EOG), submentalis EMG (chin), nasal and oral airflow, thoracic and abdominal wall motion, anterior tibialis EMG, snore microphone, electrocardiogram, and pulse oximetry. Bilevel positive airway pressure (BPAP) was initiated at the beginning of the study and titrated to treat sleep-disordered breathing.  MEDICATIONS Medications self-administered by patient taken the night of the study : N/A  RESPIRATORY PARAMETERS Optimal IPAP Pressure (cm): 19 AHI at Optimal Pressure (/hr) 3.3 Optimal EPAP Pressure (cm): 15   Overall Minimal O2 (%): 89.0 Minimal O2 at Optimal Pressure (%): 91.0  She was tried on CPAP up to 13 cm H2O but continued to have respiratory events.  She did better once transitioned to Bipap.  She did not require supplemental oxygen during this study.  SLEEP ARCHITECTURE Start Time: 10:37:22 PM Stop Time: 4:59:12 AM Total Time (min): 381.8 Total Sleep Time (min): 312 Sleep Latency (min): 31.7 Sleep Efficiency (%): 81.7% REM Latency (min): 37.5 WASO (min): 38.1 Stage N1 (%): 3.8% Stage N2 (%): 76.6% Stage N3 (%): 0.0% Stage R (%): 19.6 Supine (%): 31.46 Arousal Index (/hr): 35.0   CARDIAC DATA The  2 lead EKG demonstrated sinus rhythm. The mean heart rate was 61.6 beats per minute. Other EKG findings include: None.  LEG MOVEMENT DATA The total Periodic Limb Movements of Sleep (PLMS) were 0. The PLMS index was 0.0. A PLMS index of <15 is considered normal in adults.  IMPRESSIONS - She was tried on CPAP, but this did not control her sleep apnea. - She did well with Bipap 19/15 cm H2O.  She was observed in REM and supine sleep with this pressure setting.  DIAGNOSIS - Obstructive Sleep Apnea (G47.33)  RECOMMENDATIONS - Trial of BiPAP therapy on 19/15 cm H2O with a Small-Medium size [employer] Full Face Evora Full mask and heated humidification. - Avoid alcohol, sedatives and other CNS depressants that may worsen sleep apnea and disrupt normal sleep architecture. - Sleep hygiene should be reviewed to assess factors that may improve sleep quality. - Weight management and regular exercise should be initiated or continued.  [Electronically signed] 10/20/2021 04:42 PM  Chesley Mires MD, ABSM Diplomate, American Board of Sleep Medicine NPI: [id number]  [place] PH: [phone]   FX: [phone] [place]

## 2021-10-21 NOTE — Telephone Encounter (Signed)
Patient returned call. Went over results and nothing further is needed. Patient voiced understanding.  Patient will call back and make 2 month follow up after she received bipap. She states that she does not want to use [employer] again as a DME. Placed this information in order. Nothing further needed.

## 2021-10-21 NOTE — Telephone Encounter (Signed)
ATC patient.  LMTCB. 

## 2021-10-21 NOTE — Telephone Encounter (Signed)
Patient returned call

## 2021-10-25 ENCOUNTER — Telehealth: Payer: Self-pay

## 2021-10-25 NOTE — Telephone Encounter (Signed)
-----   Message from Fay Records, MD sent at 10/24/2021  6:27 PM EDT ----- Regarding: RE: concern for [place] , I apologize for the delay in getting back I have reviewed both her echos   The latest did not sugg Pulmonary HTN   IVC normal in sze, RV normal       Would be unusual to have [place] with this     When I saw her earlier this year she felt better, she was in [place]    I will set up to see her in clinic and examine/discuss.      Mallory Watts  ----- Message ----- From: Clayton Bibles, NP Sent: 10/20/2021  10:50 AM EDT To: Fay Records, MD; Chesley Mires, MD Subject: concern for [facility]                                Hi Dr. Harrington Challenger, I though I had sent this message before but I can't seem to find the message. I see this patient of your's at the pulm clinic. She has had progressive DOE and intermittent leg swelling recently. She underwent PFTs with normal spirometry; however, she has a moderate diffusion defect. I believe her symptoms are likely multifactorial but I'm concerned she could have underlying PAH. She has had an echo in the past where her PASP was elevated but most recently nl. She has severe untreated OSA, which we are working on, but puts her at high risk for [facility]. What are your thoughts on setting her up for right heart cath for further evaluation?   Thanks! Katie Cobb,NP

## 2021-10-25 NOTE — Progress Notes (Unsigned)
Cardiology Office Note   Date:  10/26/2021   ID:  Mallory Watts, DOB Nov 13, 1940, MRN [id number]  PCP:  [name], MD  Cardiologist:   [name], MD   F/U of CAD and PAF  History of Present Illness: Mallory Watts is a 81 y.o. female with a history of CAD  She is status post CABG in 2004.     The patient also had a monitor which showed paroxysmal atrial fib/atrial  She has been maintained on Xarelto   She was switched to Eliquis because of hematuria. The pt also has a hx of central sleep apnea   She has been followed by neuro    I saw the pt in Oct 2022 Stress test in March 2023 showed no ischemia  LVEF 47% though visually better   Low risk The pt wore a 14 day monitor in APril 2023   This showed atrial fibrillation    100% time   HR ranges 44 to 187 bpm   Average HR 79 bpm    I spoke with the pt on the phone   Complained of feeling fatigue   Based on this I recomm starting Multaq bid   She started Multaq at end of October.    One day after starting she just felt "different"   Not fatigued     When I saw her in clinic after that she was in [place]  Since then the pt has been seen in pulmonary   She had sleep study that showed severe complex sleep apnea   Is being set up for BiPAP.    The pt also had PFTs  which showed moderate decreased DLCO.   Echo scheduled  LVEF and RVEF were normal    IVC normal      The pt says how she feels will vary day to day   One day active   Fine   Another day fatigued   She denies CP    Current Meds  Medication Sig   acetaminophen (TYLENOL) 650 MG CR tablet Take 1,300 mg by mouth 2 (two) times daily as needed for pain.   albuterol (VENTOLIN HFA) 108 (90 Base) MCG/ACT inhaler Inhale 2 puffs into the lungs every 6 (six) hours as needed for wheezing or shortness of breath.   alendronate (FOSAMAX) 70 MG tablet Take 70 mg by mouth once a week. Take with a full glass of water on an empty stomach.   apixaban (ELIQUIS) 5 MG TABS tablet  Take 1 tablet (5 mg total) by mouth 2 (two) times daily.   atorvastatin (LIPITOR) 80 MG tablet TAKE 1 TABLET BY MOUTH EVERY DAY   Calcium Carbonate Antacid (TUMS PO) Take 1 tablet by mouth daily.   dronedarone (MULTAQ) 400 MG tablet Take 1 tablet (400 mg total) by mouth 2 (two) times daily with a meal.   ezetimibe (ZETIA) 10 MG tablet TAKE 1 TABLET BY MOUTH EVERY DAY   fexofenadine (ALLEGRA) 180 MG tablet Take 180 mg by mouth daily as needed.    furosemide (LASIX) 40 MG tablet Take 1.5 tablets (60 mg total) by mouth every other day.   Magnesium 250 MG TABS Take 250 mg by mouth daily as needed (LEG CRAMPS). Reported on 05/01/2015   meclizine (ANTIVERT) 12.5 MG tablet Take 12.5 mg by mouth 3 (three) times daily as needed for dizziness.   metoprolol tartrate (LOPRESSOR) 50 MG tablet Take 1.5 tablets (75 mg total) by  mouth 2 (two) times daily.   mometasone (NASONEX) 50 MCG/ACT nasal spray Place 2 sprays into the nose daily.   nitroGLYCERIN (NITROSTAT) 0.4 MG SL tablet PLACE 1 TABLET UNDER THE TONGUE EVERY 5 MINUTES AS NEEDED FOR CHEST PAIN. KEEP UPCOMING APPT   nystatin ointment (MYCOSTATIN) 1 application   omeprazole (PRILOSEC) 40 MG capsule Take 40 mg by mouth daily.   potassium chloride SA (KLOR-CON M) 20 MEQ tablet Take 1 tablet (20 mEq total) by mouth 2 (two) times daily.   solifenacin (VESICARE) 5 MG tablet One daily as needed   triamterene-hydrochlorothiazide (MAXZIDE-25) 37.5-25 MG tablet Take 1 tablet by mouth daily.   valACYclovir (VALTREX) 500 MG tablet Take 1 tablet (500 mg total) by mouth daily. Take one tablet by mouth twice a day for 3 days as needed for an outbreak.    I saw the pt back in May 2023 The pt is also followed in pulmonary      Allergies:   Atenolol, Gadolinium, and Sulfonamide derivatives   Past Medical History:  Diagnosis Date   Arthritis    Atrial fibrillation ([place])    not symptomatic -on metoprolol BID for this    CAD (coronary artery disease)    s/p CAGB  2004 at [facility]) 1998   breast cancer--left   Dysrhythmia    atrial flutter/fib   GERD (gastroesophageal reflux disease)    Hypertension    Osteopenia    Personal history of chemotherapy 1998   Personal history of radiation therapy 1998   PONV (postoperative nausea and vomiting)    Shortness of breath    exertion   STD (sexually transmitted disease)    HSV   Urinary incontinence     Past Surgical History:  Procedure Laterality Date   ANTERIOR CERVICAL DECOMP/DISCECTOMY FUSION N/A 01/14/2014   Procedure: ANTERIOR CERVICAL DECOMPRESSION/DISCECTOMY FUSION CERVICAL FOUR-FIVE,CERVICAL FIVE SIX;  Surgeon: [name], MD;  Location: [facility];  Service: Neurosurgery;  Laterality: N/A;   BREAST EXCISIONAL BIOPSY Left    BREAST LUMPECTOMY Left [place]   left lumpectomy for breast ca   COLONOSCOPY     CORONARY ARTERY BYPASS GRAFT  2004   at [place]  2006   heart bypass  2003   -single   POLYPECTOMY     TONSILLECTOMY     TOTAL KNEE ARTHROPLASTY Left 11/28/2016   Procedure: LEFT TOTAL KNEE ARTHROPLASTY;  Surgeon: [name], MD;  Location: [facility];  Service: Orthopedics;  Laterality: Left;  Canal block     Social History:  The patient  reports that she has quit smoking. She has never used smokeless tobacco. She reports current alcohol use of about 1.0 standard drink of alcohol per week. She reports that she does not use drugs.   Family History:  The patient's family history includes Diabetes in her brother and mother; Stroke in her father and mother.    ROS:  Please see the history of present illness. All other systems are reviewed and  Negative to the above problem except as noted.    PHYSICAL EXAM: VS:  BP 124/66   Pulse 68   Ht '5\' 2"'$  (1.575 m)   Wt 172 lb 3.2 oz (78.1 kg)   LMP 04/11/1994   SpO2 97%   BMI 31.50 kg/m   GEN: Obese 81 yo  in no acute distress  HEENT: normal  Neck: no JVD   Cardiac:  RRR   No S3     No LE edema Respiratory:  clear to auscultation bilaterally,  GI: soft, nontender, nondistended, + BS  No hepatomegaly  MS: no deformity Moving all extremities   Skin: warm and dry, no rash Neuro:  Strength and sensation are intact Psych: euthymic mood, full affect   EKG:  EKG is ordered today.  SR 68 bpm  First degree AV block  PR interval 204 msec    Cardiac studies     Event monitor    07/2021  Afib 100% time    Echo  07/23/21  Left ventricular ejection fraction, by estimation, is 60 to 65%. The left ventricle has normal function. The left ventricle has no regional wall motion abnormalities. There is mild left ventricular hypertrophy. Left ventricular diastolic parameters are indeterminate. 1. Right ventricular systolic function is normal. The right ventricular size is normal. There is normal pulmonary artery systolic pressure. 2. 3. Left atrial size was moderately dilated. 4. Right atrial size was severely dilated. The mitral valve is degenerative. Mild mitral valve regurgitation. Moderate mitral annular calcification. 5. 6. Tricuspid valve regurgitation is mild to moderate. 7. The aortic valve was not well visualized. Aortic valve regurgitation is mild to moderate. The inferior vena cava is normal in size with greater than 50% respiratory variability, suggesting right atrial pressure of 3 mmHg. 8. Comparison(s): No significant change from prior study.  Myoview   06/2021    ECG is abnormal. ECG rhythm shows atrial fibrillation. The ECG shows premature ventricular contractions.   Breast attenuation artifact was present.   LV perfusion is abnormal. There is no evidence of ischemia. There is no evidence of infarction. There is normal wall motion in the defect area. Consistent with artifact caused by breast attenuation.   Left ventricular function is normal. Nuclear stress EF: calculated at 47 % but visually appears at least 50-55%. The left ventricular ejection  fraction is mildly decreased (45-54%). End diastolic cavity size is normal. End systolic cavity size is normal.   Prior study not available for comparison.   The study is normal. The study is low risk.   Consider 2D echo to assess EF further. Echo   AUg 2022  Left ventricular ejection fraction, by estimation, is 55 to 60%. Left ventricular ejection fraction by 3D volume is 56 %. The left ventricle has normal function. The left ventricle has no regional wall motion abnormalities. There is mild left ventricular hypertrophy. Left ventricular diastolic parameters are consistent with Grade III diastolic dysfunction (restrictive). Elevated left atrial pressure. The average left ventricular global longitudinal strain is -17.4 %. 1. Right ventricular systolic function is normal. The right ventricular size is mildly enlarged. There is mildly elevated pulmonary artery systolic pressure. The estimated right ventricular systolic pressure is 43.3 mmHg. 2. The mitral valve is degenerative. Trivial mitral valve regurgitation. No evidence of mitral stenosis. Moderate mitral annular calcification. 3. 4. Tricuspid valve regurgitation is mild to moderate. The aortic valve is tricuspid. Aortic valve regurgitation is mild to moderate. Mild to moderate aortic valve sclerosis/calcification is present, without any evidence of aortic stenosis. 5. The inferior vena cava is dilated in size with >50% respiratory variability, suggesting right atrial pressure of 8 mmHg. 6. Comparison(s): 04/20/18 EF 60-65%. PA pressure 62mHg.  Lipid Panel    Component Value Date/Time   CHOL 115 08/31/2021 1216   TRIG 111 08/31/2021 1216   HDL 40 08/31/2021 1216   CHOLHDL 2.9 08/31/2021 1216   LDLCALC 55 08/31/2021 1216  Wt Readings from Last 3 Encounters:  10/26/21 172 lb 3.2 oz (78.1 kg)  10/18/21 170 lb (77.1 kg)  10/08/21 172 lb 12.8 oz (78.4 kg)      ASSESSMENT AND PLAN:  1   Dyspnea   Pt has dysnea that  can vary day to day   Seen in pulmonary    Noted decrease in DLCO  Seen in pulmonary clinic    Concern for [facility]   Recomm R heart cath     If patient had significant PAH I would not expect echo to be noraml    I would also expect her to be SOB all the time    ? If she is in/ out of afib    It does not explain DLCO but it may explain symptoms  2  Afib   remains in SR on Multaq   Wil lset up for monitor to see if she is in/out of afib      3.  CAD.  Patient has remote CABG in 2004. Recent stress test shows normal perfusion   NO ischemia   4 Aortic insufficiency. Echo as noted above   No murmur on exam  FOllow with periodic echooes    5dyslipidemia Keep on lipitorr    6.  Sleep apnea  To be set up for BiPAP  Current medicines are reviewed at length with the patient today.  The patient does not have concerns regarding medicines.  Signed, [name], MD  10/26/2021 10:25 AM    [address], [place], [place]  [address] Phone: [phone]; Fax: [phone]

## 2021-10-25 NOTE — Telephone Encounter (Signed)
Pt to see Dr Harrington Challenger 10/26/21.

## 2021-10-26 ENCOUNTER — Ambulatory Visit (INDEPENDENT_AMBULATORY_CARE_PROVIDER_SITE_OTHER): Payer: Medicare Other

## 2021-10-26 ENCOUNTER — Ambulatory Visit (INDEPENDENT_AMBULATORY_CARE_PROVIDER_SITE_OTHER): Payer: Medicare Other | Admitting: Internal Medicine

## 2021-10-26 ENCOUNTER — Other Ambulatory Visit: Payer: Self-pay | Admitting: Internal Medicine

## 2021-10-26 ENCOUNTER — Encounter: Payer: Self-pay | Admitting: Internal Medicine

## 2021-10-26 VITALS — BP 124/66 | HR 68 | Ht 62.0 in | Wt 172.2 lb

## 2021-10-26 DIAGNOSIS — I4892 Unspecified atrial flutter: Secondary | ICD-10-CM

## 2021-10-26 NOTE — Patient Instructions (Signed)
Medication Instructions:   *If you need a refill on your cardiac medications before your next appointment, please call your pharmacy*   Lab Work:  If you have labs (blood work) drawn today and your tests are completely normal, you will receive your results only by: [place] (if you have MyChart) OR A paper copy in the mail If you have any lab test that is abnormal or we need to change your treatment, we will call you to review the results.   Testing/Procedures:  [facility]- Long Term Monitor Instructions  Your physician has requested you wear a ZIO patch monitor for 14 days.  This is a single patch monitor. [employer] supplies one patch monitor per enrollment. Additional stickers are not available. Please do not apply patch if you will be having a Nuclear Stress Test,  Echocardiogram, Cardiac CT, MRI, or Chest Xray during the period you would be wearing the  monitor. The patch cannot be worn during these tests. You cannot remove and re-apply the  ZIO XT patch monitor.  Your ZIO patch monitor will be mailed 3 day USPS to your address on file. It may take 3-5 days  to receive your monitor after you have been enrolled.  Once you have received your monitor, please review the enclosed instructions. Your monitor  has already been registered assigning a specific monitor serial # to you.  Billing and Patient Assistance Program Information  We have supplied [employer] with any of your insurance information on file for billing purposes. [employer] offers a sliding scale Patient Assistance Program for patients that do not have  insurance, or whose insurance does not completely cover the cost of the ZIO monitor.  You must apply for the Patient Assistance Program to qualify for this discounted rate.  To apply, please call [employer] at [phone], select option 4, select option 2, ask to apply for  Patient Assistance Program. Theodore Demark will ask your household income, and how many people  are in your  household. They will quote your out-of-pocket cost based on that information.  [employer] will also be able to set up a 11-month interest-free payment plan if needed.  Applying the monitor   Shave hair from upper left chest.  Hold abrader disc by orange tab. Rub abrader in 40 strokes over the upper left chest as  indicated in your monitor instructions.  Clean area with 4 enclosed alcohol pads. Let dry.  Apply patch as indicated in monitor instructions. Patch will be placed under collarbone on left  side of chest with arrow pointing upward.  Rub patch adhesive wings for 2 minutes. Remove white label marked "1". Remove the white  label marked "2". Rub patch adhesive wings for 2 additional minutes.  While looking in a mirror, press and release button in center of patch. A small green light will  flash 3-4 times. This will be your only indicator that the monitor has been turned on.  Do not shower for the first 24 hours. You may shower after the first 24 hours.  Press the button if you feel a symptom. You will hear a small click. Record Date, Time and  Symptom in the Patient Logbook.  When you are ready to remove the patch, follow instructions on the last 2 pages of Patient  Logbook. Stick patch monitor onto the last page of Patient Logbook.  Place Patient Logbook in the blue and white box. Use locking tab on box and tape box closed  securely. The blue and white box has  prepaid postage on it. Please place it in the mailbox as  soon as possible. Your physician should have your test results approximately 7 days after the  monitor has been mailed back to [facility].  Call [place] at [phone] if you have questions regarding  your ZIO XT patch monitor. Call them immediately if you see an orange light blinking on your  monitor.  If your monitor falls off in less than 4 days, contact our Monitor department at [phone].  If your monitor becomes loose or falls off after  4 days call [employer] at [phone] for  suggestions on securing your monitor    Follow-Up: At [facility], you and your health needs are our priority.  As part of our continuing mission to provide you with exceptional heart care, we have created designated Provider Care Teams.  These Care Teams include your primary Cardiologist (physician) and Advanced Practice Providers (APPs -  Physician Assistants and Nurse Practitioners) who all work together to provide you with the care you need, when you need it.  We recommend signing up for the patient portal called "MyChart".  Sign up information is provided on this After Visit Summary.  MyChart is used to connect with patients for Virtual Visits (Telemedicine).  Patients are able to view lab/test results, encounter notes, upcoming appointments, etc.  Non-urgent messages can be sent to your provider as well.   To learn more about what you can do with MyChart, go to [email or web address].     Other Instructions   Important Information About Sugar

## 2021-10-26 NOTE — Progress Notes (Unsigned)
Enrolled patient for a 14 day Zio XT  monitor to be mailed to patients home  °

## 2021-10-27 MED ORDER — MULTAQ 400 MG PO TABS: 400.0000 mg | ORAL_TABLET | Freq: Two times a day (BID) | ORAL | 3 refills | Status: DC

## 2021-11-03 ENCOUNTER — Ambulatory Visit: Payer: Medicare Other | Admitting: Nurse Practitioner

## 2021-11-05 DIAGNOSIS — I4892 Unspecified atrial flutter: Secondary | ICD-10-CM | POA: Diagnosis not present

## 2021-11-19 NOTE — Progress Notes (Signed)
error 

## 2021-12-01 ENCOUNTER — Encounter: Payer: Self-pay | Admitting: Internal Medicine

## 2021-12-01 ENCOUNTER — Telehealth: Payer: Self-pay

## 2021-12-01 MED ORDER — METOPROLOL TARTRATE 25 MG PO TABS: 25.0000 mg | ORAL_TABLET | Freq: Two times a day (BID) | ORAL | 3 refills | Status: DC

## 2021-12-01 NOTE — Telephone Encounter (Signed)
Pt advised and verbalized understanding.. she says she has been having some vertigo which she has had on the past.. she has been using meclizine which helps... however I asked her to check her BP and HR for me and I will call her back to follow up later today to see what her VS are running.

## 2021-12-01 NOTE — Telephone Encounter (Signed)
Left a message for the pt to call back... My Chart message sent.

## 2021-12-01 NOTE — Telephone Encounter (Signed)
Left a message for the pt to call back.  

## 2021-12-01 NOTE — Telephone Encounter (Signed)
Pt sent My Chart... VSS will keep an eye on it and let me know how she is doing.

## 2021-12-01 NOTE — Telephone Encounter (Signed)
-----   Message from [name] V, MD sent at 11/26/2021 10:40 AM EDT ----- Rhythm is sinus  There is no afib noted    Average HR is 59    May be a little slow at times     A few pauses in early morning. I would recomm cutting back on metoprolol to 25 mg 2x per day (from 75) Follow BP / HR , how she feels

## 2021-12-02 ENCOUNTER — Encounter: Payer: Self-pay | Admitting: Internal Medicine

## 2021-12-02 NOTE — Telephone Encounter (Signed)
I'm assuming so. She hasn't had another sleep study since. She was supposed to start on BiPAP therapy due to her severe OSA not being well controlled on CPAP, which is why she's had trouble tolerating it in the past. If she has questions, we can set up OV to discuss. Thanks.

## 2021-12-06 ENCOUNTER — Telehealth: Payer: Self-pay

## 2021-12-06 NOTE — Telephone Encounter (Signed)
Spoke with patient to clarify potassium dosage. She is supposed to take Potassium 11mq BID.  Patient verbalized understanding. No other questions at this time.

## 2021-12-21 ENCOUNTER — Encounter: Payer: Self-pay | Admitting: Pulmonary Disease

## 2021-12-21 ENCOUNTER — Ambulatory Visit: Payer: Medicare Other | Admitting: Pulmonary Disease

## 2021-12-21 VITALS — BP 110/60 | HR 69 | Ht 62.0 in | Wt 182.6 lb

## 2021-12-21 DIAGNOSIS — F5105 Insomnia due to other mental disorder: Secondary | ICD-10-CM

## 2021-12-21 DIAGNOSIS — F419 Anxiety disorder, unspecified: Secondary | ICD-10-CM | POA: Diagnosis not present

## 2021-12-21 DIAGNOSIS — G4733 Obstructive sleep apnea (adult) (pediatric): Secondary | ICD-10-CM

## 2021-12-21 NOTE — Progress Notes (Signed)
[facility]  Chief Complaint  Patient presents with   Follow-up    Bipap compliance    Past Surgical History:  She  has a past surgical history that includes Breast surgery (1998); heart bypass (2003); Tonsillectomy; Anterior cervical decomp/discectomy fusion (N/A, 01/14/2014); Colonoscopy; Polypectomy; Breast lumpectomy (Left, 1998); Coronary artery bypass graft (2004); Total knee arthroplasty (Left, 11/28/2016); Breast excisional biopsy (Left); and Dilation and curettage of uterus (2006).  Past Medical History:  OA, CAD, A fib, Breast cancer 1998, GERD, HTN, Osteopenia  Constitutional:  BP 110/60 (BP Location: Right Arm, Cuff Size: Normal)   Pulse 69   Ht 5' 2"  (1.575 m)   Wt 182 lb 9.6 oz (82.8 kg)   LMP 04/11/1994   SpO2 96%   BMI 33.40 kg/m   Brief Summary:  Mallory Watts is a 81 y.o. female former smoker with obstructive sleep apnea and insomnia.      Subjective:   She had home sleep study in May.  Showed severe sleep apnea.  Did titration study in July.  CPAP didn't work.  Started on Bipap 19/15 cm H2O.  Using hybrid mask and this is comfortable.  AHI on download still high.    She has trouble falling asleep.  She feels anxious about various things and has trouble relaxing her mind.  She goes to bed between 10 and 11 pm.  She isn't able to fall asleep until midnight. She then sleeps through until 730 am.  She feels rested during the day.  She does tai chi in the late afternoon.  Physical Exam:   Appearance - well kempt   ENMT - no sinus tenderness, no oral exudate, no LAN, Mallampati 3 airway, no stridor, over bite  Respiratory - equal breath sounds bilaterally, no wheezing or rales  CV - s1s2 regular rate and rhythm, no murmurs  Ext - no clubbing, no edema  Skin - no rashes  Psych - normal mood and affect   Sleep Tests:  PSG 10/11/18 >> AHI 58.7, SpO2 low 80% HST 08/16/21 >> AHI 47.4, SpO2 low 72% Bipap  titration 10/18/21 >> Bipap 19/15 cm H2O >> AHI 3.3, +R, +S. Bipap 11/20/21 to 12/19/21 >> used on 25 of 30 nights with average 5 hrs 27 min.  Average AHI 24.6 with Bipap 19/15 cm H2O.  Air leak noted  Cardiac Tests:  Echo 07/23/21 >> EF 60 to 65%, mild LVH, mod LA dilation, severe RA dilation, mild MR, mild/mod TR, mild/mod AR  Social History:  She  reports that she has quit smoking. She has never used smokeless tobacco. She reports current alcohol use of about 1.0 standard drink of alcohol per week. She reports that she does not use drugs.  Family History:  Her family history includes Diabetes in her brother and mother; Stroke in her father and mother.     Assessment/Plan:   Obstructive sleep apnea. - CPAP was not effective - she is compliant with Bipap and reports benefit from therapy - uses [employer] for her DME - current Bipap ordered July 2023 - AHI elevated on download; might related to air leak - will change Bipap to 17/13 cm H2O  Insomnia secondary to anxiety. - discussed sleep restriction, stimulus control and relaxation techniques - advised her to set her bed time to midnight - reviewed proper sleep hygiene habits - discussed setting realistic expectations from sleep - defer addition of sleep aide medication at this time  Atrial fibrillation, Valvular heart  disease, Coronary artery disease, Hypertension. - followed by Dr. [name] with cardiology  Obesity. - discussed how weight can impact sleep and risk for sleep disordered breathing - discussed options to assist with weight loss: combination of diet modification, cardiovascular and strength training exercises  Time Spent Involved in Patient Care on Day of Examination:  37 minutes  Follow up:   Patient Instructions  Try going to bed at midnight  You can use your nose spray as needed before going to bed  Will have [facility] change your Bipap to 17/13 cm water pressure  Follow up in 6 months  Medication List:    Allergies as of 12/21/2021       Reactions   Atenolol Other (See Comments)   Gadolinium Nausea Only, Other (See Comments)    Desc: PATIENT FELT DIZZY AND NAUSEOUS AFTER GAD INJECTION. NO VOMITING,NO HIVES, NO RASH., Onset Date: [id number]   Sulfonamide Derivatives Hives        Medication List        Accurate as of December 21, 2021 12:41 PM. If you have any questions, ask your nurse or doctor.          STOP taking these medications    albuterol 108 (90 Base) MCG/ACT inhaler Commonly known as: VENTOLIN HFA Stopped by: Chesley Mires, MD   nystatin ointment Commonly known as: MYCOSTATIN Stopped by: Chesley Mires, MD       TAKE these medications    acetaminophen 650 MG CR tablet Commonly known as: TYLENOL Take 1,300 mg by mouth 2 (two) times daily as needed for pain.   alendronate 70 MG tablet Commonly known as: FOSAMAX Take 70 mg by mouth once a week. Take with a full glass of water on an empty stomach.   apixaban 5 MG Tabs tablet Commonly known as: ELIQUIS Take 1 tablet (5 mg total) by mouth 2 (two) times daily.   atorvastatin 80 MG tablet Commonly known as: LIPITOR TAKE 1 TABLET BY MOUTH EVERY DAY   ezetimibe 10 MG tablet Commonly known as: ZETIA TAKE 1 TABLET BY MOUTH EVERY DAY   fexofenadine 180 MG tablet Commonly known as: ALLEGRA Take 180 mg by mouth daily as needed.   furosemide 40 MG tablet Commonly known as: LASIX Take 1.5 tablets (60 mg total) by mouth every other day.   Magnesium 250 MG Tabs Take 250 mg by mouth daily as needed (LEG CRAMPS). Reported on 05/01/2015   meclizine 12.5 MG tablet Commonly known as: ANTIVERT Take 12.5 mg by mouth 3 (three) times daily as needed for dizziness.   metoprolol tartrate 25 MG tablet Commonly known as: LOPRESSOR Take 1 tablet (25 mg total) by mouth 2 (two) times daily.   mometasone 50 MCG/ACT nasal spray Commonly known as: NASONEX Place 2 sprays into the nose daily.   Multaq 400 MG tablet Generic  drug: dronedarone Take 1 tablet (400 mg total) by mouth 2 (two) times daily with a meal.   nitroGLYCERIN 0.4 MG SL tablet Commonly known as: NITROSTAT PLACE 1 TABLET UNDER THE TONGUE EVERY 5 MINUTES AS NEEDED FOR CHEST PAIN. KEEP UPCOMING APPT   omeprazole 40 MG capsule Commonly known as: PRILOSEC Take 40 mg by mouth daily.   potassium chloride SA 20 MEQ tablet Commonly known as: KLOR-CON M Take 1 tablet (20 mEq total) by mouth 2 (two) times daily.   solifenacin 5 MG tablet Commonly known as: VESIcare One daily as needed   triamterene-hydrochlorothiazide 37.5-25 MG tablet Commonly known as: MAXZIDE-25 Take  1 tablet by mouth daily.   TUMS PO Take 1 tablet by mouth daily.   valACYclovir 500 MG tablet Commonly known as: VALTREX Take 1 tablet (500 mg total) by mouth daily. Take one tablet by mouth twice a day for 3 days as needed for an outbreak.        Signature:  Chesley Mires, MD [place] Pager - [phone] 12/21/2021, 12:41 PM

## 2021-12-21 NOTE — Patient Instructions (Signed)
Try going to bed at midnight  You can use your nose spray as needed before going to bed  Will have [employer] change your Bipap to 17/13 cm water pressure  Follow up in 6 months

## 2021-12-26 ENCOUNTER — Other Ambulatory Visit: Payer: Self-pay | Admitting: Nurse Practitioner

## 2021-12-26 DIAGNOSIS — J3089 Other allergic rhinitis: Secondary | ICD-10-CM

## 2021-12-28 NOTE — Progress Notes (Unsigned)
Cardiology Office Note   Date:  12/29/2021   ID:  Mallory Watts, DOB [birth year], MRN [id number]  PCP:  [name], MD  Cardiologist:   [name], MD   F/U of CAD and PAF  History of Present Illness: Mallory Watts is a 81 y.o. female with a history of CAD  She is status post CABG in 2004.     The patient also had a monitor which showed paroxysmal atrial fib/atrial  She has been maintained on Xarelto   She was switched to Eliquis because of hematuria. The pt also has a hx of central sleep apnea   She has been followed by neuro   Stress test in March 2023 showed no ischemia  LVEF 47% though visually better   Low risk The pt wore a 14 day monitor in APril 2023   This showed atrial fibrillation    100% time   HR ranges 44 to 187 bpm   Average HR 79 bpm    I spoke with the pt on the phone   Complained of feeling fatigue   Based on this I recomm starting Multaq bid   She started Multaq at end of October.    One day after starting she just felt "different"   Not fatigued     When I saw her in clinic after that she was in [place]     Continued Multaq  Pt seen in Pulmonary Clinic She had sleep study that showed severe complex sleep apnea   Is being set up for BiPAP.    The pt also had PFTs  which showed moderate decreased DLCO.   Echo scheduled  LVEF and RVEF were normal    IVC normal     Concern for decrease DLCO   Pulmonary questioned whether R heart cath should be done     With normal RV size/function I did not think this was probablle   [place], I recomm backing down on b blocker and following HR response and breathing    Since change in meds  she says she is able to walk a flight of stairs without getting SOB    She denies palpitations   No CP    I   Diet: Breakfast   Egg   Toast (wheat toast [employer]) or english muffin   Coffee   1 sugar Lunch    Yogurt  YOplait   or sandwich onf wheat    Diet soda Dinner    steak    Brocolli       Pasta often (patient says she is  [place])  Current Meds  Medication Sig   acetaminophen (TYLENOL) 650 MG CR tablet Take 1,300 mg by mouth 2 (two) times daily as needed for pain.   alendronate (FOSAMAX) 70 MG tablet Take 70 mg by mouth once a week. Take with a full glass of water on an empty stomach.   apixaban (ELIQUIS) 5 MG TABS tablet Take 1 tablet (5 mg total) by mouth 2 (two) times daily.   atorvastatin (LIPITOR) 80 MG tablet TAKE 1 TABLET BY MOUTH EVERY DAY   Calcium Carbonate Antacid (TUMS PO) Take 1 tablet by mouth daily.   dronedarone (MULTAQ) 400 MG tablet Take 1 tablet (400 mg total) by mouth 2 (two) times daily with a meal.   ezetimibe (ZETIA) 10 MG tablet TAKE 1 TABLET BY MOUTH EVERY DAY   fexofenadine (ALLEGRA) 180 MG tablet Take 180 mg by mouth daily as needed.  furosemide (LASIX) 40 MG tablet Take 1.5 tablets (60 mg total) by mouth every other day.   Magnesium 250 MG TABS Take 250 mg by mouth daily as needed (LEG CRAMPS). Reported on 05/01/2015   meclizine (ANTIVERT) 12.5 MG tablet Take 12.5 mg by mouth 3 (three) times daily as needed for dizziness.   metoprolol tartrate (LOPRESSOR) 25 MG tablet Take 1 tablet (25 mg total) by mouth 2 (two) times daily.   mometasone (NASONEX) 50 MCG/ACT nasal spray PLACE 2 SPRAYS INTO THE NOSE DAILY.   omeprazole (PRILOSEC) 40 MG capsule Take 40 mg by mouth daily.   potassium chloride SA (KLOR-CON M) 20 MEQ tablet Take 1 tablet (20 mEq total) by mouth 2 (two) times daily.   solifenacin (VESICARE) 5 MG tablet One daily as needed   triamterene-hydrochlorothiazide (MAXZIDE-25) 37.5-25 MG tablet Take 1 tablet by mouth daily.   valACYclovir (VALTREX) 500 MG tablet Take 1 tablet (500 mg total) by mouth daily. Take one tablet by mouth twice a day for 3 days as needed for an outbreak.   [DISCONTINUED] nitroGLYCERIN (NITROSTAT) 0.4 MG SL tablet PLACE 1 TABLET UNDER THE TONGUE EVERY 5 MINUTES AS NEEDED FOR CHEST PAIN. KEEP UPCOMING APPT       Allergies:   Atenolol, Gadolinium, and  Sulfonamide derivatives   Past Medical History:  Diagnosis Date   Arthritis    Atrial fibrillation ([place])    not symptomatic -on metoprolol BID for this    CAD (coronary artery disease)    s/p CAGB 2004 at [facility]) 1998   breast cancer--left   Dysrhythmia    atrial flutter/fib   GERD (gastroesophageal reflux disease)    Hypertension    Osteopenia    Personal history of chemotherapy 1998   Personal history of radiation therapy 1998   PONV (postoperative nausea and vomiting)    Shortness of breath    exertion   STD (sexually transmitted disease)    HSV   Urinary incontinence     Past Surgical History:  Procedure Laterality Date   ANTERIOR CERVICAL DECOMP/DISCECTOMY FUSION N/A 01/14/2014   Procedure: ANTERIOR CERVICAL DECOMPRESSION/DISCECTOMY FUSION CERVICAL FOUR-FIVE,CERVICAL FIVE SIX;  Surgeon: [name], MD;  Location: [facility];  Service: Neurosurgery;  Laterality: N/A;   BREAST EXCISIONAL BIOPSY Left    BREAST LUMPECTOMY Left [place]   left lumpectomy for breast ca   COLONOSCOPY     CORONARY ARTERY BYPASS GRAFT  2004   at [place]  2006   heart bypass  2003   -single   POLYPECTOMY     TONSILLECTOMY     TOTAL KNEE ARTHROPLASTY Left 11/28/2016   Procedure: LEFT TOTAL KNEE ARTHROPLASTY;  Surgeon: [name], MD;  Location: [facility];  Service: Orthopedics;  Laterality: Left;  Canal block     Social History:  The patient  reports that she has quit smoking. She has never used smokeless tobacco. She reports current alcohol use of about 1.0 standard drink of alcohol per week. She reports that she does not use drugs.   Family History:  The patient's family history includes Diabetes in her brother and mother; Stroke in her father and mother.    ROS:  Please see the history of present illness. All other systems are reviewed and  Negative to the above problem except as noted.    PHYSICAL EXAM: VS:  BP  120/70 (BP Location: Right Arm, Patient Position:  Sitting, Cuff Size: Normal)   Pulse 62   Ht '5\' 2"'$  (1.575 m)   Wt 175 lb (79.4 kg)   LMP 04/11/1994   BMI 32.01 kg/m   GEN: Obese 81 yo  in no acute distress  HEENT: normal  Neck: no JVD   Cardiac: RRR  No S3 No murmurs   No LE  edema Respiratory:  clear to auscultation bilaterally,  GI: soft, nontender, nondistended, + BS  No hepatomegaly  MS: no deformity Moving all extremities   Skin: warm and dry, no rash Neuro:  Strength and sensation are intact Psych: euthymic mood, full affect   EKG:  EKG is ordered today.  SR 62 bpm    Cardiac studies     Event monitor    07/2021  Afib 100% time    Echo  07/23/21  Left ventricular ejection fraction, by estimation, is 60 to 65%. The left ventricle has normal function. The left ventricle has no regional wall motion abnormalities. There is mild left ventricular hypertrophy. Left ventricular diastolic parameters are indeterminate. 1. Right ventricular systolic function is normal. The right ventricular size is normal. There is normal pulmonary artery systolic pressure. 2. 3. Left atrial size was moderately dilated. 4. Right atrial size was severely dilated. The mitral valve is degenerative. Mild mitral valve regurgitation. Moderate mitral annular calcification. 5. 6. Tricuspid valve regurgitation is mild to moderate. 7. The aortic valve was not well visualized. Aortic valve regurgitation is mild to moderate. The inferior vena cava is normal in size with greater than 50% respiratory variability, suggesting right atrial pressure of 3 mmHg. 8. Comparison(s): No significant change from prior study.  Myoview   06/2021    ECG is abnormal. ECG rhythm shows atrial fibrillation. The ECG shows premature ventricular contractions.   Breast attenuation artifact was present.   LV perfusion is abnormal. There is no evidence of ischemia. There is no evidence of infarction. There is normal wall  motion in the defect area. Consistent with artifact caused by breast attenuation.   Left ventricular function is normal. Nuclear stress EF: calculated at 47 % but visually appears at least 50-55%. The left ventricular ejection fraction is mildly decreased (45-54%). End diastolic cavity size is normal. End systolic cavity size is normal.   Prior study not available for comparison.   The study is normal. The study is low risk.   Consider 2D echo to assess EF further. Echo   AUg 2022  Left ventricular ejection fraction, by estimation, is 55 to 60%. Left ventricular ejection fraction by 3D volume is 56 %. The left ventricle has normal function. The left ventricle has no regional wall motion abnormalities. There is mild left ventricular hypertrophy. Left ventricular diastolic parameters are consistent with Grade III diastolic dysfunction (restrictive). Elevated left atrial pressure. The average left ventricular global longitudinal strain is -17.4 %. 1. Right ventricular systolic function is normal. The right ventricular size is mildly enlarged. There is mildly elevated pulmonary artery systolic pressure. The estimated right ventricular systolic pressure is 64.4 mmHg. 2. The mitral valve is degenerative. Trivial mitral valve regurgitation. No evidence of mitral stenosis. Moderate mitral annular calcification. 3. 4. Tricuspid valve regurgitation is mild to moderate. The aortic valve is tricuspid. Aortic valve regurgitation is mild to moderate. Mild to moderate aortic valve sclerosis/calcification is present, without any evidence of aortic stenosis. 5. The inferior vena cava is dilated in size with >50% respiratory variability, suggesting right atrial pressure of 8 mmHg. 6. Comparison(s): 04/20/18 EF 60-65%. PA  pressure 34mHg.  Lipid Panel    Component Value Date/Time   CHOL 115 08/31/2021 1216   TRIG 111 08/31/2021 1216   HDL 40 08/31/2021 1216   CHOLHDL 2.9 08/31/2021 1216   LDLCALC  55 08/31/2021 1216      Wt Readings from Last 3 Encounters:  12/29/21 175 lb (79.4 kg)  12/21/21 182 lb 9.6 oz (82.8 kg)  10/26/21 172 lb 3.2 oz (78.1 kg)      ASSESSMENT AND PLAN:  1   Dyspnea   Pt doing better with drop in b Blocker dose and maintaining SR   Follow      2  Afib   remains in SR on Multaq   Monitor in Aug showed no afib    Cut back on b blocker since average HR 59     3.  CAD.  Patient has remote CABG in 2004. Myoview in March 2023 showed normal perfusion   NO ischemia   4 Aortic insufficiency. Echo as noted above   AI mild to moderate    Again, no murmur on exam  FOllow with periodic echooes    5.  dyslipidemia Keep on lipitor  LDL 55  HDL 40     6.  Sleep apnea  To be set up for BiPAP   7  DM   A1C 6.4   Pt has a lot of room  for improvement on diet   Given ZOe site to look/listen to.   Current medicines are reviewed at length with the patient today.  The patient does not have concerns regarding medicines.  Signed, PDorris Carnes MD  12/29/2021 7:13 PM    [facility] [address] [place]  [id number]: [phone] Fax: [phone]

## 2021-12-29 ENCOUNTER — Ambulatory Visit: Payer: Medicare Other | Attending: Internal Medicine | Admitting: Internal Medicine

## 2021-12-29 VITALS — BP 120/70 | HR 62 | Ht 62.0 in | Wt 175.0 lb

## 2021-12-29 DIAGNOSIS — I480 Paroxysmal atrial fibrillation: Secondary | ICD-10-CM

## 2021-12-29 MED ORDER — NITROGLYCERIN 0.4 MG SL SUBL: SUBLINGUAL_TABLET | SUBLINGUAL | 2 refills | Status: AC

## 2021-12-29 NOTE — Patient Instructions (Signed)
Medication Instructions:   *If you need a refill on your cardiac medications before your next appointment, please call your pharmacy*   Lab Work:  If you have labs (blood work) drawn today and your tests are completely normal, you will receive your results only by: [place] (if you have MyChart) OR A paper copy in the mail If you have any lab test that is abnormal or we need to change your treatment, we will call you to review the results.   Testing/Procedures:    Follow-Up: At [facility], you and your health needs are our priority.  As part of our continuing mission to provide you with exceptional heart care, we have created designated Provider Care Teams.  These Care Teams include your primary Cardiologist (physician) and Advanced Practice Providers (APPs -  Physician Assistants and Nurse Practitioners) who all work together to provide you with the care you need, when you need it.  We recommend signing up for the patient portal called "MyChart".  Sign up information is provided on this After Visit Summary.  MyChart is used to connect with patients for Virtual Visits (Telemedicine).  Patients are able to view lab/test results, encounter notes, upcoming appointments, etc.  Non-urgent messages can be sent to your provider as well.   To learn more about what you can do with MyChart, go to [email or web address].    Your next appointment:   6 month(s) March   The format for your next appointment:   In Person  Provider:   [name], MD     Other Instructions   Important Information About Sugar

## 2022-01-21 NOTE — Addendum Note (Signed)
Addended by: Janan Halter F on: 01/21/2022 10:04 AM   Modules accepted: Orders

## 2022-01-25 ENCOUNTER — Encounter: Payer: Self-pay | Admitting: Internal Medicine

## 2022-02-05 ENCOUNTER — Other Ambulatory Visit: Payer: Self-pay | Admitting: Internal Medicine

## 2022-02-15 ENCOUNTER — Other Ambulatory Visit: Payer: Self-pay | Admitting: Internal Medicine

## 2022-03-31 ENCOUNTER — Telehealth: Payer: Self-pay | Admitting: Internal Medicine

## 2022-03-31 NOTE — Telephone Encounter (Signed)
Pt reports has had chest tightness to right side of chest that comes and goes.  Describes as a grabbing feeling.  Has taken 6 NTG today.  Does not have a recent BP reading.  Reports is out and about with friends. Has a hx of Reflux but think discomfort is r/t reflux. Advised pt to go to the ED for evaluation. Pt expresses understanding.

## 2022-03-31 NOTE — Telephone Encounter (Signed)
Pt c/o of Chest Pain: STAT if CP now or developed within 24 hours  1. Are you having CP right now? no  2. Are you experiencing any other symptoms (ex. SOB, nausea, vomiting, sweating)? no  3. How long have you been experiencing CP? started today  4. Is your CP continuous or coming and going? Comes and goes  5. Have you taken Nitroglycerin? Yes, throughout the day she has taken 6   Patient states she has been having a pulling at her chest feeling today. She says it comes and goes and has taken 6 nitroglycerin throughout the day. ?

## 2022-04-01 ENCOUNTER — Encounter: Payer: Self-pay | Admitting: Internal Medicine

## 2022-04-01 NOTE — Telephone Encounter (Signed)
Spoke with pt and reviewed medications prescribed by Dr Harrington Challenger and/or [facility].  Pt is planning to have pharmacy package her medications.  Advised pt she should contact her PCP as well if there are questions related to medicines he prescribes.  Pt states she is feeling better today and denies any CP or additional symptoms at this time.  Pt verbalizes understanding and thanked Therapist, sports for the call.

## 2022-06-29 ENCOUNTER — Other Ambulatory Visit: Payer: Self-pay | Admitting: Hematology and Oncology

## 2022-06-29 DIAGNOSIS — Z1231 Encounter for screening mammogram for malignant neoplasm of breast: Secondary | ICD-10-CM

## 2022-07-03 NOTE — Progress Notes (Unsigned)
Cardiology Office Note   Date:  07/03/2022   ID:  Mallory Watts, DOB August 09, 1940, MRN [id number]  PCP:  [name], MD  Cardiologist:   [name], MD   F/U of CAD and PAF  History of Present Illness: Mallory Watts is a 82 y.o. female with a history of CAD (status post CABG in 2004, stress myoview in 2023 showed no ischemia), PAF, central sleep apnea,.      The pt wore a 14 day monitor in APril 2023 which showed atrial fibrillation 100% time   HR ranges 44 to 187 bpm   Average HR 79 bpm  I saw her after and placed her on Multaq   One day, after starting Multaq, she said she felt better  EKG at next visit showed SR     Pt seen in Pulmonary Clinic She had sleep study that showed severe sleep apnea  Set up for BiPAP.    The pt also had PFTs  which showed moderate decreased DLCO.   Cut back on metoprolol   Breathing got better      I saw the pt in Sept 2023   Since seen, she says she is not feeling as good   She notes she has less energy, gets SOB easier with exertion.     She denies CP    No PND  No edema   No palpitations   SHe says she has been more unsteady since L knee replaced      No outpatient medications have been marked as taking for the 07/04/22 encounter (Appointment) with Fay Records, MD.       Allergies:   Atenolol, Gadolinium, and Sulfonamide derivatives   Past Medical History:  Diagnosis Date   Arthritis    Atrial fibrillation ([place])    not symptomatic -on metoprolol BID for this    CAD (coronary artery disease)    s/p CAGB 2004 at [place] [facility]) 1998   breast cancer--left   Dysrhythmia    atrial flutter/fib   GERD (gastroesophageal reflux disease)    Hypertension    Osteopenia    Personal history of chemotherapy 1998   Personal history of radiation therapy 1998   PONV (postoperative nausea and vomiting)    Shortness of breath    exertion   STD (sexually transmitted disease)    HSV   Urinary incontinence     Past Surgical History:   Procedure Laterality Date   ANTERIOR CERVICAL DECOMP/DISCECTOMY FUSION N/A 01/14/2014   Procedure: ANTERIOR CERVICAL DECOMPRESSION/DISCECTOMY FUSION CERVICAL FOUR-FIVE,CERVICAL FIVE SIX;  Surgeon: [name], MD;  Location: [facility];  Service: Neurosurgery;  Laterality: N/A;   BREAST EXCISIONAL BIOPSY Left    BREAST LUMPECTOMY Left [place]   left lumpectomy for breast ca   COLONOSCOPY     CORONARY ARTERY BYPASS GRAFT  2004   at [place]  2006   heart bypass  2003   -single   POLYPECTOMY     TONSILLECTOMY     TOTAL KNEE ARTHROPLASTY Left 11/28/2016   Procedure: LEFT TOTAL KNEE ARTHROPLASTY;  Surgeon: [name], MD;  Location: [facility];  Service: Orthopedics;  Laterality: Left;  Canal block     Social History:  The patient  reports that she has quit smoking. She has never used smokeless tobacco. She reports current alcohol use of about 1.0 standard drink of alcohol per week. She reports  that she does not use drugs.   Family History:  The patient's family history includes Diabetes in her brother and mother; Stroke in her father and mother.    ROS:  Please see the history of present illness. All other systems are reviewed and  Negative to the above problem except as noted.    PHYSICAL EXAM: VS:  LMP 04/11/1994   GEN: Obese 82 yo  in no acute distress  HEENT: normal  Neck: no JVD   Cardiac: RRR  No S3 No murmurs   No LE  edema Respiratory:  clear to auscultation bilaterally,  GI: soft, nontender, nondistended, + BS  No hepatomegaly  MS: no deformity Moving all extremities   Skin: warm and dry, no rash Neuro:  Strength and sensation are intact Psych: euthymic mood, full affect   EKG:  EKG is ordered today.  SR 62 bpm    Cardiac studies     Event monitor    07/2021  Afib 100% time    Echo  07/23/21  Left ventricular ejection fraction, by estimation, is 60 to 65%. The left ventricle has normal function. The left  ventricle has no regional wall motion abnormalities. There is mild left ventricular hypertrophy. Left ventricular diastolic parameters are indeterminate. 1. Right ventricular systolic function is normal. The right ventricular size is normal. There is normal pulmonary artery systolic pressure. 2. 3. Left atrial size was moderately dilated. 4. Right atrial size was severely dilated. The mitral valve is degenerative. Mild mitral valve regurgitation. Moderate mitral annular calcification. 5. 6. Tricuspid valve regurgitation is mild to moderate. 7. The aortic valve was not well visualized. Aortic valve regurgitation is mild to moderate. The inferior vena cava is normal in size with greater than 50% respiratory variability, suggesting right atrial pressure of 3 mmHg. 8. Comparison(s): No significant change from prior study.  Myoview   06/2021    ECG is abnormal. ECG rhythm shows atrial fibrillation. The ECG shows premature ventricular contractions.   Breast attenuation artifact was present.   LV perfusion is abnormal. There is no evidence of ischemia. There is no evidence of infarction. There is normal wall motion in the defect area. Consistent with artifact caused by breast attenuation.   Left ventricular function is normal. Nuclear stress EF: calculated at 47 % but visually appears at least 50-55%. The left ventricular ejection fraction is mildly decreased (45-54%). End diastolic cavity size is normal. End systolic cavity size is normal.   Prior study not available for comparison.   The study is normal. The study is low risk.   Consider 2D echo to assess EF further. Echo   AUg 2022  Left ventricular ejection fraction, by estimation, is 55 to 60%. Left ventricular ejection fraction by 3D volume is 56 %. The left ventricle has normal function. The left ventricle has no regional wall motion abnormalities. There is mild left ventricular hypertrophy. Left ventricular diastolic parameters are  consistent with Grade III diastolic dysfunction (restrictive). Elevated left atrial pressure. The average left ventricular global longitudinal strain is -17.4 %. 1. Right ventricular systolic function is normal. The right ventricular size is mildly enlarged. There is mildly elevated pulmonary artery systolic pressure. The estimated right ventricular systolic pressure is [id number] mmHg. 2. The mitral valve is degenerative. Trivial mitral valve regurgitation. No evidence of mitral stenosis. Moderate mitral annular calcification. 3. 4. Tricuspid valve regurgitation is mild to moderate. The aortic valve is tricuspid. Aortic valve regurgitation is mild to moderate. Mild to moderate aortic valve sclerosis/calcification  is present, without any evidence of aortic stenosis. 5. The inferior vena cava is dilated in size with >50% respiratory variability, suggesting right atrial pressure of 8 mmHg. 6. Comparison(s): 04/20/18 EF 60-65%. PA pressure 57mmHg.  Lipid Panel    Component Value Date/Time   CHOL 115 08/31/2021 1216   TRIG 111 08/31/2021 1216   HDL 40 08/31/2021 1216   CHOLHDL 2.9 08/31/2021 1216   LDLCALC 55 08/31/2021 1216      Wt Readings from Last 3 Encounters:  12/29/21 175 lb (79.4 kg)  12/21/21 182 lb 9.6 oz (82.8 kg)  10/26/21 172 lb 3.2 oz (78.1 kg)      ASSESSMENT AND PLAN:  1   CAD  Pt has noted more DOE   She appears to be in SR  Volume status loos OK   I would recomm a Lexiscan PET/CT to evaluate ischemia given remote CABG         2  Afib  Clinically in SR on Multaq   Monitor in Aug showed no afib  Will set up for repeat monitor to r/o excess bradycardia or    4 Aortic insufficiency. Echo as noted above   AI mild to moderate    Again, no murmur on exam  FOllow with periodic echooes    5.  dyslipidemia Keep on lipitor   LDL In Sept 2023 was 51        6.  Sleep apnea Uses BiPAP  7  DM   A1C 6.6 in Feb 2024  Discussed diet   8  Ortho   Balance off since knee  surgery   Will refer to sports med   Want to avoid fall   Current medicines are reviewed at length with the patient today.  The patient does not have concerns regarding medicines.  Signed, [name], MD  07/03/2022 8:35 PM    [address], [place], [place]  [address] Phone: [phone]; Fax: [phone]

## 2022-07-04 ENCOUNTER — Ambulatory Visit: Payer: Medicare Other | Attending: Internal Medicine | Admitting: Internal Medicine

## 2022-07-04 ENCOUNTER — Encounter: Payer: Self-pay | Admitting: Internal Medicine

## 2022-07-04 ENCOUNTER — Ambulatory Visit (INDEPENDENT_AMBULATORY_CARE_PROVIDER_SITE_OTHER): Payer: Medicare Other

## 2022-07-04 VITALS — BP 130/74 | HR 65 | Ht 62.0 in | Wt 176.8 lb

## 2022-07-04 DIAGNOSIS — R001 Bradycardia, unspecified: Secondary | ICD-10-CM | POA: Diagnosis not present

## 2022-07-04 DIAGNOSIS — R2689 Other abnormalities of gait and mobility: Secondary | ICD-10-CM | POA: Diagnosis not present

## 2022-07-04 DIAGNOSIS — I2510 Atherosclerotic heart disease of native coronary artery without angina pectoris: Secondary | ICD-10-CM | POA: Diagnosis not present

## 2022-07-04 NOTE — Progress Notes (Unsigned)
Enrolled for [employer] to mail a ZIO XT long term holter monitor to the patients address on file.  

## 2022-07-04 NOTE — Patient Instructions (Addendum)
Medication Instructions:   *If you need a refill on your cardiac medications before your next appointment, please call your pharmacy*   Lab Work:  If you have labs (blood work) drawn today and your tests are completely normal, you will receive your results only by: [place] (if you have MyChart) OR A paper copy in the mail If you have any lab test that is abnormal or we need to change your treatment, we will call you to review the results.   Testing/Procedures:   Bryn Gulling- Long Term Monitor Instructions  Your physician has requested you wear a ZIO patch monitor for 3 days.  This is a single patch monitor. [employer] supplies one patch monitor per enrollment. Additional stickers are not available. Please do not apply patch if you will be having a Nuclear Stress Test,  Echocardiogram, Cardiac CT, MRI, or Chest Xray during the period you would be wearing the  monitor. The patch cannot be worn during these tests. You cannot remove and re-apply the  ZIO XT patch monitor.  Your ZIO patch monitor will be mailed 3 day USPS to your address on file. It may take 3-5 days  to receive your monitor after you have been enrolled.  Once you have received your monitor, please review the enclosed instructions. Your monitor  has already been registered assigning a specific monitor serial # to you.  Billing and Patient Assistance Program Information  We have supplied [employer] with any of your insurance information on file for billing purposes. [employer] offers a sliding scale Patient Assistance Program for patients that do not have  insurance, or whose insurance does not completely cover the cost of the ZIO monitor.  You must apply for the Patient Assistance Program to qualify for this discounted rate.  To apply, please call [employer] at [phone], select option 4, select option 2, ask to apply for  Patient Assistance Program. Theodore Demark will ask your household income, and how many people  are in your  household. They will quote your out-of-pocket cost based on that information.  [employer] will also be able to set up a 67-month, interest-free payment plan if needed.  Applying the monitor   Shave hair from upper left chest.  Hold abrader disc by orange tab. Rub abrader in 40 strokes over the upper left chest as  indicated in your monitor instructions.  Clean area with 4 enclosed alcohol pads. Let dry.  Apply patch as indicated in monitor instructions. Patch will be placed under collarbone on left  side of chest with arrow pointing upward.  Rub patch adhesive wings for 2 minutes. Remove white label marked "1". Remove the white  label marked "2". Rub patch adhesive wings for 2 additional minutes.  While looking in a mirror, press and release button in center of patch. A small green light will  flash 3-4 times. This will be your only indicator that the monitor has been turned on.  Do not shower for the first 24 hours. You may shower after the first 24 hours.  Press the button if you feel a symptom. You will hear a small click. Record Date, Time and  Symptom in the Patient Logbook.  When you are ready to remove the patch, follow instructions on the last 2 pages of Patient  Logbook. Stick patch monitor onto the last page of Patient Logbook.  Place Patient Logbook in the blue and white box. Use locking tab on box and tape box closed  securely. The blue and white box  has prepaid postage on it. Please place it in the mailbox as  soon as possible. Your physician should have your test results approximately 7 days after the  monitor has been mailed back to [facility].  Call [place] at [phone] if you have questions regarding  your ZIO XT patch monitor. Call them immediately if you see an orange light blinking on your  monitor.  If your monitor falls off in less than 4 days, contact our Monitor department at [phone].  If your monitor becomes loose or falls off after  4 days call [employer] at [phone] for  suggestions on securing your monitor      Lexiscan Cardiac Pet Scan   How to Prepare for Your Cardiac PET/CT Stress Test:  1. Please do not take these medications before your test:   Your medications may be taken with water.  2. Nothing to eat or drink, except water, 3 hours prior to arrival time.   NO caffeine/decaffeinated products, or chocolate 12 hours prior to arrival.  3. NO perfume, cologne or lotion  4. Total time is 1 to 2 hours; you may want to bring reading material for the waiting time.  5. Please report to Radiology at the [facility] Main Entrance 30 minutes early for your test.  [place], [place] [address]   In preparation for your appointment, medication and supplies will be purchased.  Appointment availability is limited, so if you need to cancel or reschedule, please call the Radiology Department at [phone]  24 hours in advance to avoid a cancellation fee of $100.00  What to Expect After you Arrive:  Once you arrive and check in for your appointment, you will be taken to a preparation room within the Radiology Department.  A technologist or Nurse will obtain your medical history, verify that you are correctly prepped for the exam, and explain the procedure.  Afterwards,  an IV will be started in your arm and electrodes will be placed on your skin for EKG monitoring during the stress portion of the exam. Then you will be escorted to the PET/CT scanner.  There, staff will get you positioned on the scanner and obtain a blood pressure and EKG.  During the exam, you will continue to be connected to the EKG and blood pressure machines.  A small, safe amount of a radioactive tracer will be injected in your IV to obtain a series of pictures of your heart along with an injection of a stress agent.    After your Exam:  It is recommended that you eat a meal and drink a caffeinated beverage to counter  act any effects of the stress agent.  Drink plenty of fluids for the remainder of the day and urinate frequently for the first couple of hours after the exam.  Your doctor will inform you of your test results within 7-10 business days.  For questions about your test or how to prepare for your test, please call: Marchia Bond, Cardiac Imaging Nurse Navigator  Gordy Clement, Cardiac Imaging Nurse Navigator Office: [phone]   Follow-Up: At [facility], you and your health needs are our priority.  As part of our continuing mission to provide you with exceptional heart care, we have created designated Provider Care Teams.  These Care Teams include your primary Cardiologist (physician) and Advanced Practice Providers (APPs -  Physician Assistants and Nurse Practitioners) who all work together to provide you with the care you need, when  you need it.  We recommend signing up for the patient portal called "MyChart".  Sign up information is provided on this After Visit Summary.  MyChart is used to connect with patients for Virtual Visits (Telemedicine).  Patients are able to view lab/test results, encounter notes, upcoming appointments, etc.  Non-urgent messages can be sent to your provider as well.   To learn more about what you can do with MyChart, go to [email or web address].     REFERRAL TO DR Gardenia Phlegm FOR BALANCE

## 2022-07-07 DIAGNOSIS — R001 Bradycardia, unspecified: Secondary | ICD-10-CM | POA: Diagnosis not present

## 2022-07-29 ENCOUNTER — Ambulatory Visit: Payer: Medicare Other | Admitting: Family Medicine

## 2022-07-29 VITALS — BP 112/60 | HR 71 | Ht 62.0 in | Wt 176.0 lb

## 2022-07-29 DIAGNOSIS — R269 Unspecified abnormalities of gait and mobility: Secondary | ICD-10-CM | POA: Diagnosis not present

## 2022-07-29 NOTE — Patient Instructions (Signed)
Thank you for coming in today.   I've referred you to Physical Therapy.  Let us know if you don't hear from them in one week.   Recheck in 6 weeks.   Let me know if this is not working.     

## 2022-07-29 NOTE — Progress Notes (Signed)
   Rubin Payor, PhD, LAT, ATC acting as a scribe for Clementeen Graham, MD.  Subjective:    CC: L knee pain and balance issues  HPI: Pt is a 82 y/o female c/o L knee pain. Pt underwent a L TKR in 2018 w/ Dr. Lequita Halt. Patient states that since her knee surgery 5 years ago she has been having balance issues and sometimes feels unstable like she may fall. This is at its worse when in the kitchen if she turns to fast. Patient states that she feel like one of her legs is shorter than the other since the surgery and now she walks with a limp slightly. Patient has notices some left hip pain since all of this has been going on but she has been ignoring it because she doesn't want to deal with another surgery.   Her main issue is that she has noticed a change in gait that is not improved much over time.  Pertinent review of Systems: No fevers or chills  Relevant historical information: Prior knee replacement.  Sleep apnea.   Objective:    Vitals:   07/29/22 1022  BP: 112/60  Pulse: 71  SpO2: 96%   General: Well Developed, well nourished, and in no acute distress.   MSK: Left knee mature scar anterior knee.  Otherwise normal-appearing Normal knee motion. Lower extremity strength very slight decreased left lower extremity strength compared to right.  4+/5 to hip flexion abduction and knee extension and flexion. Reflexes are intact.  Gait slight more hip shift with left leg and right.  Gait is somewhat asymmetric and slower.  Leg lengths are equal   Impression and Recommendations:    Assessment and Plan: 82 y.o. female with abnormal gait following knee replacement. I think she has some subtle weakness and may be some hesitancy or maladaptive gait patterns learned following knee replacement.  She would do well with a retrial of physical therapy.  Will refer to neuro rehab location. Recheck 6 weeks.  PDMP not reviewed this encounter. Orders Placed This Encounter  Procedures    Ambulatory referral to Physical Therapy    Referral Priority:   Routine    Referral Type:   Physical Medicine    Referral Reason:   Specialty Services Required    Requested Specialty:   Physical Therapy    Number of Visits Requested:   1   No orders of the defined types were placed in this encounter.   Discussed warning signs or symptoms. Please see discharge instructions. Patient expresses understanding.   The above documentation has been reviewed and is accurate and complete Clementeen Graham, M.D.

## 2022-08-08 ENCOUNTER — Ambulatory Visit: Payer: Medicare Other | Admitting: Physical Therapy

## 2022-08-08 ENCOUNTER — Ambulatory Visit: Payer: Medicare Other | Attending: Family Medicine | Admitting: Physical Therapy

## 2022-08-08 ENCOUNTER — Other Ambulatory Visit: Payer: Self-pay

## 2022-08-08 ENCOUNTER — Encounter: Payer: Self-pay | Admitting: Physical Therapy

## 2022-08-08 DIAGNOSIS — M6281 Muscle weakness (generalized): Secondary | ICD-10-CM | POA: Insufficient documentation

## 2022-08-08 DIAGNOSIS — R2689 Other abnormalities of gait and mobility: Secondary | ICD-10-CM

## 2022-08-08 DIAGNOSIS — R269 Unspecified abnormalities of gait and mobility: Secondary | ICD-10-CM | POA: Diagnosis not present

## 2022-08-08 DIAGNOSIS — R2681 Unsteadiness on feet: Secondary | ICD-10-CM | POA: Diagnosis present

## 2022-08-08 NOTE — Therapy (Signed)
OUTPATIENT PHYSICAL THERAPY NEURO EVALUATION   Patient Name: Mallory Watts MRN: [id number] DOB:05-11-1940, 82 y.o., female Today's Date: 08/09/2022   PCP:    Creola Corn, MD   REFERRING PROVIDER: Rodolph Bong, MD   END OF SESSION:  PT End of Session - 08/08/22 1409     Visit Number 1    Number of Visits 12    Date for PT Re-Evaluation 09/16/22    Authorization Type [employer] Medicare    PT Start Time 1405    PT Stop Time 1446    PT Time Calculation (min) 41 min    Equipment Utilized During Treatment Gait belt    Activity Tolerance Patient tolerated treatment well    Behavior During Therapy WFL for tasks assessed/performed             Past Medical History:  Diagnosis Date   Arthritis    Atrial fibrillation (HCC)    not symptomatic -on metoprolol BID for this    CAD (coronary artery disease)    s/p CAGB 2004 at [facility]   Cancer [facility]) 1998   breast cancer--left   Dysrhythmia    atrial flutter/fib   GERD (gastroesophageal reflux disease)    Hypertension    Osteopenia    Personal history of chemotherapy 1998   Personal history of radiation therapy 1998   PONV (postoperative nausea and vomiting)    Shortness of breath    exertion   STD (sexually transmitted disease)    HSV   Urinary incontinence    Past Surgical History:  Procedure Laterality Date   ANTERIOR CERVICAL DECOMP/DISCECTOMY FUSION N/A 01/14/2014   Procedure: ANTERIOR CERVICAL DECOMPRESSION/DISCECTOMY FUSION CERVICAL FOUR-FIVE,CERVICAL FIVE SIX;  Surgeon: Barnett Abu, MD;  Location: [facility] NEURO ORS;  Service: Neurosurgery;  Laterality: N/A;   BREAST EXCISIONAL BIOPSY Left    BREAST LUMPECTOMY Left 1998   BREAST SURGERY  1998   left lumpectomy for breast ca   COLONOSCOPY     CORONARY ARTERY BYPASS GRAFT  2004   at [facility]   DILATION AND CURETTAGE OF UTERUS  2006   heart bypass  2003   -single   POLYPECTOMY     TONSILLECTOMY     TOTAL KNEE ARTHROPLASTY Left 11/28/2016   Procedure: LEFT TOTAL KNEE  ARTHROPLASTY;  Surgeon: Ollen Gross, MD;  Location: [facility] ORS;  Service: Orthopedics;  Laterality: Left;  Canal block   Patient Active Problem List   Diagnosis Date Noted   Severe obstructive sleep apnea 08/24/2021   CHF (congestive heart failure) (HCC) 08/24/2021   DOE (dyspnea on exertion) 08/24/2021   Chronic cough 08/24/2021   History of left breast cancer 04/19/2021   Status post coronary artery bypass graft 03/30/2018   Coronary artery disease 03/30/2018   Paroxysmal atrial flutter (HCC) 03/30/2018   Dyslipidemia 03/30/2018   OA (osteoarthritis) of knee 11/28/2016   Cervical spondylosis with myelopathy and radiculopathy 01/14/2014   RECTAL BLEEDING 01/07/2009   PERSONAL HX COLONIC POLYPS 01/07/2009   GASTROESOPHAGEAL REFLUX DISEASE, HX OF 08/01/2007   ESOPHAGEAL STRICTURE 05/19/2005   ESOPHAGEAL REFLUX 05/19/2005   GASTRITIS, ACUTE 05/19/2005   ADENOMATOUS COLONIC POLYP 05/09/2005    ONSET DATE: 07/29/2022 (MD referral)  REFERRING DIAG: R26.9 (ICD-10-CM) - Abnormality of gait   THERAPY DIAG:  Other abnormalities of gait and mobility  Muscle weakness (generalized)  Unsteadiness on feet  Rationale for Evaluation and Treatment: Rehabilitation  SUBJECTIVE:  SUBJECTIVE STATEMENT: Had the L knee replaced and feel off balance since then, especially more as I get older.  No falls.  Sometimes turning in the kitchen even makes me unbalanced. Sometimes just walking make me imbalanced, unlevel surfaces. Pt accompanied by: self  PERTINENT HISTORY: L TKR in 2018 w/ Dr. Lequita Halt, A-fib  PAIN:  Are you having pain? No  PRECAUTIONS: Fall  WEIGHT BEARING RESTRICTIONS: No  FALLS: Has patient fallen in last 6 months? No  LIVING ENVIRONMENT: Lives with: lives with an adult companion Lives in:  House/apartment Stairs: Yes: Internal: 12 steps; on right going up and on left going up and External: 5 steps; on right going up, on left going up, and can reach both3 levels Has following equipment at home: Single point cane and Grab bars  PLOF: Independent and Leisure: would like to return to neighborhood walking; takes Tai Chi classes  PATIENT GOALS: To feel more secure on my feet.  OBJECTIVE:   DIAGNOSTIC FINDINGS: NA  COGNITION: Overall cognitive status: Within functional limits for tasks assessed   SENSATION: WFL   EDEMA:  Reports occasional swelling in ankles.  LOWER EXTREMITY ROM:     Active  Right Eval Left Eval  Hip flexion    Hip extension    Hip abduction    Hip adduction    Hip internal rotation    Hip external rotation    Knee flexion    Knee extension 0 -11 (passive -8)  Ankle dorsiflexion 20 20  Ankle plantarflexion    Ankle inversion    Ankle eversion     (Blank rows = not tested)  LOWER EXTREMITY MMT:    MMT Right Eval Left Eval  Hip flexion 4+ 4  Hip extension    Hip abduction 4+ 4  Hip adduction    Hip internal rotation    Hip external rotation    Knee flexion 4 4  Knee extension 4+ 4  Ankle dorsiflexion 3+ 3+  Ankle plantarflexion    Ankle inversion    Ankle eversion    (Blank rows = not tested)    TRANSFERS: Assistive device utilized: None  Sit to stand: Modified independence Stand to sit: Modified independence   GAIT: Gait pattern:  slight lateral lean to L, pt reports she feels LLE slaps the ground harder than the R, step through pattern, and wide BOS Distance walked: 100 ft Assistive device utilized: None Level of assistance: SBA   FUNCTIONAL TESTS:  5 times sit to stand: 16.19 sec arms crossed at chest Timed up and go (TUG): NT 10 meter walk test: 16.81 sec (1.95 ft/sec) Dynamic Gait Index: 19/24 SLS:  RLE:1.5 sec  LLE: < 1 sec Tandem stance: 6 sec RLE posterior,30 sec LLE posteiror   M-CTSIB  Condition 1:  Firm Surface, EO 30 Sec, Normal Sway  Condition 2: Firm Surface, EC 30 Sec, Mild Sway  Condition 3: Foam Surface, EO 30 Sec, Mild Sway  Condition 4: Foam Surface, EC 30 Sec, Moderate Sway     Observed leg length in supine: (after 3 bridging attempts):  Pt's LLE appears approx 1/2 inch longer than RLE.  Will need to further assess.  TODAY'S TREATMENT:  DATE: 08/08/2022    PATIENT EDUCATION: Education details: PT eval results, POC Person educated: Patient Education method: Explanation, Demonstration, and Handouts Education comprehension: verbalized understanding, returned demonstration, and needs further education  HOME EXERCISE PROGRAM: Not yet initiated  GOALS: Goals reviewed with patient? Yes  SHORT TERM GOALS: Target date: 09/02/2022  Pt will be independent with HEP for improved strength, balance, gait. Baseline: Goal status: INITIAL  2.  Pt will improve 5x sit<>stand to less than or equal to 13 sec to demonstrate improved functional strength and transfer efficiency. Baseline: 16.19 sec Goal status: INITIAL  3.  Pt will improve SLS to at least 3 sec for improved balance and functional strength. Baseline: 1-1.5 sec Goal status: INITIAL  LONG TERM GOALS: Target date: 09/16/2022  Pt will be independent with HEP for improved balance, strength, gait. Baseline:  Goal status: INITIAL  2.  Pt will improve gait velocity to at least 2.6 ft/sec for improved gait efficiency and safety. Baseline: 1.95 ft/sec Goal status: INITIAL  3. Pt will improve DGI score to at least 21/24 to decrease fall risk. Baseline: 19/24 Goal status: INITIAL  4.  Pt will perform Condition 4 on MCTSIB for 30 sec with mild sway for improved balance.  Baseline: mod sway Goal status: INITIAL  5.  Pt will verbalize understanding of fall prevention in home environment.  Baseline:   Goal status: INITIAL  ASSESSMENT:  CLINICAL IMPRESSION: Patient is a 82 y.o. female who was seen today for physical therapy evaluation and treatment for abnormal gait.  Pt reports feeling off balance as time passes following her L TKR in 2018.  She presents today with decreased strength, decreased balance, abnormal gait.  She is at fall risk per DGI score.  She has difficulty with SLS, tandem stance, and has mod sway on Condition 4 of MCTSIB for 30 sec.  She initially appears to have LLD with LLE longer than RLE, but will further assess.  She will benefit from skilled PT to address the above stated deficits for decreased fall risk and improved overall functional mobility.  OBJECTIVE IMPAIRMENTS: Abnormal gait, decreased balance, decreased mobility, difficulty walking, decreased ROM, decreased strength, and impaired flexibility.   ACTIVITY LIMITATIONS: carrying, stairs, transfers, and locomotion level  PARTICIPATION LIMITATIONS: community activity and yard work  PERSONAL FACTORS: 3+ comorbidities: see PMH above  are also affecting patient's functional outcome.   REHAB POTENTIAL: Good  CLINICAL DECISION MAKING: Stable/uncomplicated  EVALUATION COMPLEXITY: Low  PLAN:  PT FREQUENCY: 2x/week  PT DURATION: 6 weeks including eval week  PLANNED INTERVENTIONS: Therapeutic exercises, Therapeutic activity, Neuromuscular re-education, Balance training, Gait training, Patient/Family education, Self Care, Stair training, Orthotic/Fit training, DME instructions, and Manual therapy  PLAN FOR NEXT SESSION: Fully assess Leg length and address discrepancy if needed; work on L hamstring stretch, BLE strengthening, SLS/tandem stance, compliant surface balance exercises   Vela Render W., PT 08/09/2022, 8:10 AM  [facility] at [facility] [address] [place], [place] [address] Phone # [phone] Fax # [phone]

## 2022-08-10 ENCOUNTER — Other Ambulatory Visit: Payer: Self-pay | Admitting: Internal Medicine

## 2022-08-12 ENCOUNTER — Encounter: Payer: Self-pay | Admitting: Physical Therapy

## 2022-08-12 ENCOUNTER — Ambulatory Visit: Payer: Medicare Other | Attending: Family Medicine | Admitting: Physical Therapy

## 2022-08-12 DIAGNOSIS — M6281 Muscle weakness (generalized): Secondary | ICD-10-CM | POA: Diagnosis present

## 2022-08-12 DIAGNOSIS — R2689 Other abnormalities of gait and mobility: Secondary | ICD-10-CM | POA: Insufficient documentation

## 2022-08-12 DIAGNOSIS — R2681 Unsteadiness on feet: Secondary | ICD-10-CM | POA: Insufficient documentation

## 2022-08-12 NOTE — Therapy (Signed)
OUTPATIENT PHYSICAL THERAPY NEURO TREATMENT NOTE   Patient Name: Mallory Watts MRN: [id number] DOB:02/21/1941, 82 y.o., female Today's Date: 08/12/2022   PCP:    Creola Corn, MD   REFERRING PROVIDER: Rodolph Bong, MD   END OF SESSION:  PT End of Session - 08/12/22 1105     Visit Number 2    Number of Visits 12    Date for PT Re-Evaluation 09/16/22    Authorization Type U[employer] Medicare    PT Start Time 1105    PT Stop Time 1145    PT Time Calculation (min) 40 min    Equipment Utilized During Treatment Gait belt    Activity Tolerance Patient tolerated treatment well    Behavior During Therapy WFL for tasks assessed/performed              Past Medical History:  Diagnosis Date   Arthritis    Atrial fibrillation (HCC)    not symptomatic -on metoprolol BID for this    CAD (coronary artery disease)    s/p CAGB 2004 at [facility]   Cancer [facility]) 1998   breast cancer--left   Dysrhythmia    atrial flutter/fib   GERD (gastroesophageal reflux disease)    Hypertension    Osteopenia    Personal history of chemotherapy 1998   Personal history of radiation therapy 1998   PONV (postoperative nausea and vomiting)    Shortness of breath    exertion   STD (sexually transmitted disease)    HSV   Urinary incontinence    Past Surgical History:  Procedure Laterality Date   ANTERIOR CERVICAL DECOMP/DISCECTOMY FUSION N/A 01/14/2014   Procedure: ANTERIOR CERVICAL DECOMPRESSION/DISCECTOMY FUSION CERVICAL FOUR-FIVE,CERVICAL FIVE SIX;  Surgeon: Barnett Abu, MD;  Location: [facility];  Service: Neurosurgery;  Laterality: N/A;   BREAST EXCISIONAL BIOPSY Left    BREAST LUMPECTOMY Left 1998   BREAST SURGERY  1998   left lumpectomy for breast ca   COLONOSCOPY     CORONARY ARTERY BYPASS GRAFT  2004   at [facility]   DILATION AND CURETTAGE OF UTERUS  2006   heart bypass  2003   -single   POLYPECTOMY     TONSILLECTOMY     TOTAL KNEE ARTHROPLASTY Left 11/28/2016   Procedure: LEFT TOTAL KNEE  ARTHROPLASTY;  Surgeon: Ollen Gross, MD;  Location: [facility];  Service: Orthopedics;  Laterality: Left;  Canal block   Patient Active Problem List   Diagnosis Date Noted   Severe obstructive sleep apnea 08/24/2021   CHF (congestive heart failure) (HCC) 08/24/2021   DOE (dyspnea on exertion) 08/24/2021   Chronic cough 08/24/2021   History of left breast cancer 04/19/2021   Status post coronary artery bypass graft 03/30/2018   Coronary artery disease 03/30/2018   Paroxysmal atrial flutter (HCC) 03/30/2018   Dyslipidemia 03/30/2018   OA (osteoarthritis) of knee 11/28/2016   Cervical spondylosis with myelopathy and radiculopathy 01/14/2014   RECTAL BLEEDING 01/07/2009   PERSONAL HX COLONIC POLYPS 01/07/2009   GASTROESOPHAGEAL REFLUX DISEASE, HX OF 08/01/2007   ESOPHAGEAL STRICTURE 05/19/2005   ESOPHAGEAL REFLUX 05/19/2005   GASTRITIS, ACUTE 05/19/2005   ADENOMATOUS COLONIC POLYP 05/09/2005    ONSET DATE: 07/29/2022 (MD referral)  REFERRING DIAG: R26.9 (ICD-10-CM) - Abnormality of gait   THERAPY DIAG:  Other abnormalities of gait and mobility  Muscle weakness (generalized)  Unsteadiness on feet  Rationale for Evaluation and Treatment: Rehabilitation  SUBJECTIVE:  SUBJECTIVE STATEMENT: My left hip was a little sore after Monday's eval, could tell my muscles had been worked a bit. Pt accompanied by: self  PERTINENT HISTORY: L TKR in 2018 w/ Dr. Lequita Halt, A-fib  PAIN:  Are you having pain? No  PRECAUTIONS: Fall  WEIGHT BEARING RESTRICTIONS: No  FALLS: Has patient fallen in last 6 months? No  LIVING ENVIRONMENT: Lives with: lives with an adult companion Lives in: House/apartment Stairs: Yes: Internal: 12 steps; on right going up and on left going up and External: 5 steps; on right going  up, on left going up, and can reach both3 levels Has following equipment at home: Single point cane and Grab bars  PLOF: Independent and Leisure: would like to return to neighborhood walking; takes Tai Chi classes  PATIENT GOALS: To feel more secure on my feet.  OBJECTIVE:    TODAY'S TREATMENT: 08/12/2022 Activity Comments  -Forward/back walking x 2 min -Sidestepping x 2 min Reports fatigue in L hip towards end of sidestepping  -Stagger stance forward/back rocking x 10 reps -Wide BOS lateral rocking stretch -good technique   Seated hamstring stretch, 3 x 30" Good technique  Tandem stance: 3 x 15 sec Light UE support  Standing hip abduction 2 x 10 reps 2nd set alternating legs  Standing hip extension Pt reports she can feel LLE sliding and RLE clearing floor with this exercise  Sit<>stand 2 x 5   NuStep, 5 minutes, BLEs Level 3 for strength, flexibility SPM >50-60  Looked at leg length, after bridging x 3, LLE appears about 3/4 inch longer with BLEs straight    Access Code: [email or web address] URL: [email or web address] Date: 08/12/2022 Prepared by: [facility] - Outpatient  Rehab - [facility]  Exercises - Seated Hamstring Stretch  - 1 x daily - 7 x weekly - 1 sets - 3 reps -  30 sec hold - Staggered Stance Forward Backward Weight Shift with Counter Support  - 1 x daily - 7 x weekly - 2-3 sets - 10 reps - Standing Hip Abduction with Counter Support  - 1 x daily - 5 x weekly - 2-3 sets - 10 reps  PATIENT EDUCATION: Education details: HEP, leg length discrepancy and discussed possibility of small height insert into R shoe Person educated: Patient Education method: Explanation, Demonstration, and Handouts Education comprehension: verbalized understanding and returned demonstration  --------------------------------------------------------------- Objective measures below taken at initial eval:  DIAGNOSTIC FINDINGS: NA  COGNITION: Overall cognitive status: Within functional  limits for tasks assessed   SENSATION: WFL   EDEMA:  Reports occasional swelling in ankles.  LOWER EXTREMITY ROM:     Active  Right Eval Left Eval  Hip flexion    Hip extension    Hip abduction    Hip adduction    Hip internal rotation    Hip external rotation    Knee flexion    Knee extension 0 -11 (passive -8)  Ankle dorsiflexion 20 20  Ankle plantarflexion    Ankle inversion    Ankle eversion     (Blank rows = not tested)  LOWER EXTREMITY MMT:    MMT Right Eval Left Eval  Hip flexion 4+ 4  Hip extension    Hip abduction 4+ 4  Hip adduction    Hip internal rotation    Hip external rotation    Knee flexion 4 4  Knee extension 4+ 4  Ankle dorsiflexion 3+ 3+  Ankle plantarflexion    Ankle inversion    Ankle eversion    (  Blank rows = not tested)    TRANSFERS: Assistive device utilized: None  Sit to stand: Modified independence Stand to sit: Modified independence   GAIT: Gait pattern:  slight lateral lean to L, pt reports she feels LLE slaps the ground harder than the R, step through pattern, and wide BOS Distance walked: 100 ft Assistive device utilized: None Level of assistance: SBA   FUNCTIONAL TESTS:  5 times sit to stand: 16.19 sec arms crossed at chest Timed up and go (TUG): NT 10 meter walk test: 16.81 sec (1.95 ft/sec) Dynamic Gait Index: 19/24 SLS:  RLE:1.5 sec  LLE: < 1 sec Tandem stance: 6 sec RLE posterior,30 sec LLE posteiror   M-CTSIB  Condition 1: Firm Surface, EO 30 Sec, Normal Sway  Condition 2: Firm Surface, EC 30 Sec, Mild Sway  Condition 3: Foam Surface, EO 30 Sec, Mild Sway  Condition 4: Foam Surface, EC 30 Sec, Moderate Sway     Observed leg length in supine: (after 3 bridging attempts):  Pt's LLE appears approx 1/2 inch longer than RLE.  Will need to further assess.  TODAY'S TREATMENT:                                                                                                                              DATE:  08/08/2022    PATIENT EDUCATION: Education details: PT eval results, POC Person educated: Patient Education method: Explanation, Demonstration, and Handouts Education comprehension: verbalized understanding, returned demonstration, and needs further education  HOME EXERCISE PROGRAM: Not yet initiated  GOALS: Goals reviewed with patient? Yes  SHORT TERM GOALS: Target date: 09/02/2022  Pt will be independent with HEP for improved strength, balance, gait. Baseline: Goal status: IN PROGRESS  2.  Pt will improve 5x sit<>stand to less than or equal to 13 sec to demonstrate improved functional strength and transfer efficiency. Baseline: 16.19 sec Goal status: IN PROGRESS  3.  Pt will improve SLS to at least 3 sec for improved balance and functional strength. Baseline: 1-1.5 sec Goal status: IN PROGRESS  LONG TERM GOALS: Target date: 09/16/2022  Pt will be independent with HEP for improved balance, strength, gait. Baseline:  Goal status: IN PROGRESS  2.  Pt will improve gait velocity to at least 2.6 ft/sec for improved gait efficiency and safety. Baseline: 1.95 ft/sec Goal status: IN PROGRESS  3. Pt will improve DGI score to at least 21/24 to decrease fall risk. Baseline: 19/24 Goal status: IN PROGRESS  4.  Pt will perform Condition 4 on MCTSIB for 30 sec with mild sway for improved balance.  Baseline: mod sway Goal status: IN PROGRESS  5.  Pt will verbalize understanding of fall prevention in home environment.  Baseline:  Goal status: IN PROGRESS  ASSESSMENT:  CLINICAL IMPRESSION: Skilled PT session today focused on strengthening and stretching exercises.  Pt notes fatigue in L hip with sidestepping and hip abduction exercises.  She notes with hip extension that L leg  slides on floor and RLE clears floor.  With gait, she does appear to lean/veer more towards L side during stance.  With assessing LLD, LLE appears approx 3/4 inch longer in supine;  discussed options for  possibility of minimal shoe insert.  May need to more fully assess true/apparent leg length and see if more stretching is needed.  She will continue to benefit from strength and balance work to improve gait pattern.  OBJECTIVE IMPAIRMENTS: Abnormal gait, decreased balance, decreased mobility, difficulty walking, decreased ROM, decreased strength, and impaired flexibility.   ACTIVITY LIMITATIONS: carrying, stairs, transfers, and locomotion level  PARTICIPATION LIMITATIONS: community activity and yard work  PERSONAL FACTORS: 3+ comorbidities: see PMH above  are also affecting patient's functional outcome.   REHAB POTENTIAL: Good  CLINICAL DECISION MAKING: Stable/uncomplicated  EVALUATION COMPLEXITY: Low  PLAN:  PT FREQUENCY: 2x/week  PT DURATION: 6 weeks including eval week  PLANNED INTERVENTIONS: Therapeutic exercises, Therapeutic activity, Neuromuscular re-education, Balance training, Gait training, Patient/Family education, Self Care, Stair training, Orthotic/Fit training, DME instructions, and Manual therapy  PLAN FOR NEXT SESSION: Fully assess Leg length (true/apparent) and address with stretching or ask about insert.  Review HEP, and continue BLE strengthening, SLS/tandem stance, compliant surface balance exercises   Patrina Andreas W., PT 08/12/2022, 12:43 PM  [facility] at [facility] [address] [place], [address] [place], [place] [address] Phone # [phone] Fax # [phone]

## 2022-08-15 ENCOUNTER — Ambulatory Visit
Admission: RE | Admit: 2022-08-15 | Discharge: 2022-08-15 | Disposition: A | Discharge status: Home / Self Care | Payer: Medicare Other | Source: Ambulatory Visit | Attending: Hematology and Oncology | Admitting: Hematology and Oncology

## 2022-08-15 DIAGNOSIS — Z1231 Encounter for screening mammogram for malignant neoplasm of breast: Secondary | ICD-10-CM

## 2022-08-15 NOTE — Therapy (Signed)
OUTPATIENT PHYSICAL THERAPY NEURO TREATMENT NOTE   Patient Name: Mallory Watts MRN: [id number] DOB:[birth year], 82 y.o., female Today's Date: 08/16/2022   PCP:    Creola Corn, MD   REFERRING PROVIDER: Rodolph Bong, MD   END OF SESSION:  PT End of Session - 08/16/22 1143     Visit Number 3    Number of Visits 12    Date for PT Re-Evaluation 09/16/22    Authorization Type [employer]HC Medicare    PT Start Time 1106   pt late   PT Stop Time 1144    PT Time Calculation (min) 38 min    Equipment Utilized During Treatment Gait belt    Activity Tolerance Patient tolerated treatment well    Behavior During Therapy WFL for tasks assessed/performed               Past Medical History:  Diagnosis Date   Arthritis    Atrial fibrillation (HCC)    not symptomatic -on metoprolol BID for this    CAD (coronary artery disease)    s/p CAGB 2004 at [facility]   Cancer [facility]) 1998   breast cancer--left   Dysrhythmia    atrial flutter/fib   GERD (gastroesophageal reflux disease)    Hypertension    Osteopenia    Personal history of chemotherapy 1998   Personal history of radiation therapy 1998   PONV (postoperative nausea and vomiting)    Shortness of breath    exertion   STD (sexually transmitted disease)    HSV   Urinary incontinence    Past Surgical History:  Procedure Laterality Date   ANTERIOR CERVICAL DECOMP/DISCECTOMY FUSION N/A 01/14/2014   Procedure: ANTERIOR CERVICAL DECOMPRESSION/DISCECTOMY FUSION CERVICAL FOUR-FIVE,CERVICAL FIVE SIX;  Surgeon: Barnett Abu, MD;  Location: [facility];  Service: Neurosurgery;  Laterality: N/A;   BREAST EXCISIONAL BIOPSY Left    BREAST LUMPECTOMY Left 1998   BREAST SURGERY  1998   left lumpectomy for breast ca   COLONOSCOPY     CORONARY ARTERY BYPASS GRAFT  2004   at [facility]   DILATION AND CURETTAGE OF UTERUS  2006   heart bypass  2003   -single   POLYPECTOMY     TONSILLECTOMY     TOTAL KNEE ARTHROPLASTY Left 11/28/2016   Procedure: LEFT  TOTAL KNEE ARTHROPLASTY;  Surgeon: Ollen Gross, MD;  Location: [facility];  Service: Orthopedics;  Laterality: Left;  Canal block   Patient Active Problem List   Diagnosis Date Noted   Severe obstructive sleep apnea 08/24/2021   CHF (congestive heart failure) (HCC) 08/24/2021   DOE (dyspnea on exertion) 08/24/2021   Chronic cough 08/24/2021   History of left breast cancer 04/19/2021   Status post coronary artery bypass graft 03/30/2018   Coronary artery disease 03/30/2018   Paroxysmal atrial flutter (HCC) 03/30/2018   Dyslipidemia 03/30/2018   OA (osteoarthritis) of knee 11/28/2016   Cervical spondylosis with myelopathy and radiculopathy 01/14/2014   RECTAL BLEEDING 01/07/2009   PERSONAL HX COLONIC POLYPS 01/07/2009   GASTROESOPHAGEAL REFLUX DISEASE, HX OF 08/01/2007   ESOPHAGEAL STRICTURE 05/19/2005   ESOPHAGEAL REFLUX 05/19/2005   GASTRITIS, ACUTE 05/19/2005   ADENOMATOUS COLONIC POLYP 05/09/2005    ONSET DATE: 07/29/2022 (MD referral)  REFERRING DIAG: R26.9 (ICD-10-CM) - Abnormality of gait   THERAPY DIAG:  Muscle weakness (generalized)  Other abnormalities of gait and mobility  Unsteadiness on feet  Rationale for Evaluation and Treatment: Rehabilitation  SUBJECTIVE:  SUBJECTIVE STATEMENT: Has been having some pain in the R shin, mostly at night but magnesium helps. Got some shoe inserts and asking how to use them.   Pt accompanied by: self  PERTINENT HISTORY: L TKR in 2018 w/ Dr. Lequita Halt, A-fib  PAIN:  Are you having pain? No  PRECAUTIONS: Fall  WEIGHT BEARING RESTRICTIONS: No  FALLS: Has patient fallen in last 6 months? No  LIVING ENVIRONMENT: Lives with: lives with an adult companion Lives in: House/apartment Stairs: Yes: Internal: 12 steps; on right going up and on left  going up and External: 5 steps; on right going up, on left going up, and can reach both3 levels Has following equipment at home: Single point cane and Grab bars  PLOF: Independent and Leisure: would like to return to neighborhood walking; takes Tai Chi classes  PATIENT GOALS: To feel more secure on my feet.  OBJECTIVE:     TODAY'S TREATMENT: 08/16/22 Activity Comments  STS 5x, 10x with blue med ball at chest Cues to scoot to edge and keep weight close to chest   SLS 2x30" Light fingertip support   tandem stance 2x30" Weaning to no UE support    romberg foam 30", standing EC 2x30" on foam Good stability EO, mild-mod imbalance with EC  tandem foam 30" each LE 1 fingertip support   1/4 and 1/2 turns to targets in 2-4 steps Performed well and slow and quicker pace   mini squat 2x10 Cueing to shift hips posteriorly; noted some L hip discomfort but able to continue  sidestepping red TB above knees x1 min  Along counter top        HOME EXERCISE PROGRAM Last updated: 08/16/22 Access Code: [email or web address] URL: [email or web address] Date: 08/16/2022 Prepared by: [facility] - Outpatient  Rehab - [facility]  Program Notes standing exercises should be done at counter top for safety.   Exercises - Seated Hamstring Stretch  - 1 x daily - 7 x weekly - 1 sets - 3 reps -  30 sec hold - Staggered Stance Forward Backward Weight Shift with Counter Support  - 1 x daily - 7 x weekly - 2-3 sets - 10 reps - Standing Hip Abduction with Counter Support  - 1 x daily - 5 x weekly - 2-3 sets - 10 reps - Standing Tandem Balance with Counter Support  - 1 x daily - 5 x weekly - 2 sets - 30 sec hold - Standing Balance with Eyes Closed on Foam  - 1 x daily - 5 x weekly - 2 sets - 30 sec hold - 180 Degree Pivot Turn with Single Point Cane  - 1 x daily - 5 x weekly - 2 sets - 10 reps  PATIENT EDUCATION: Education details: encouraged pt to use 1 shoe insert in R shoe and how to adjust to size; to be  worn only a couple house a day and slowly increase to tolerance; HEP update with edu on safety Person educated: Patient  Education method: Explanation, Demonstration, Tactile cues, Verbal cues, and Handouts Education comprehension: verbalized understanding and returned demonstration     --------------------------------------------------------------- Objective measures below taken at initial eval:  DIAGNOSTIC FINDINGS: NA  COGNITION: Overall cognitive status: Within functional limits for tasks assessed   SENSATION: WFL   EDEMA:  Reports occasional swelling in ankles.  LOWER EXTREMITY ROM:     Active  Right Eval Left Eval  Hip flexion    Hip extension    Hip abduction    Hip  adduction    Hip internal rotation    Hip external rotation    Knee flexion    Knee extension 0 -11 (passive -8)  Ankle dorsiflexion 20 20  Ankle plantarflexion    Ankle inversion    Ankle eversion     (Blank rows = not tested)  LOWER EXTREMITY MMT:    MMT Right Eval Left Eval  Hip flexion 4+ 4  Hip extension    Hip abduction 4+ 4  Hip adduction    Hip internal rotation    Hip external rotation    Knee flexion 4 4  Knee extension 4+ 4  Ankle dorsiflexion 3+ 3+  Ankle plantarflexion    Ankle inversion    Ankle eversion    (Blank rows = not tested)    TRANSFERS: Assistive device utilized: None  Sit to stand: Modified independence Stand to sit: Modified independence   GAIT: Gait pattern:  slight lateral lean to L, pt reports she feels LLE slaps the ground harder than the R, step through pattern, and wide BOS Distance walked: 100 ft Assistive device utilized: None Level of assistance: SBA   FUNCTIONAL TESTS:  5 times sit to stand: 16.19 sec arms crossed at chest Timed up and go (TUG): NT 10 meter walk test: 16.81 sec (1.95 ft/sec) Dynamic Gait Index: 19/24 SLS:  RLE:1.5 sec  LLE: < 1 sec Tandem stance: 6 sec RLE posterior,30 sec LLE posteiror   M-CTSIB  Condition 1:  Firm Surface, EO 30 Sec, Normal Sway  Condition 2: Firm Surface, EC 30 Sec, Mild Sway  Condition 3: Foam Surface, EO 30 Sec, Mild Sway  Condition 4: Foam Surface, EC 30 Sec, Moderate Sway     Observed leg length in supine: (after 3 bridging attempts):  Pt's LLE appears approx 1/2 inch longer than RLE.  Will need to further assess.  TODAY'S TREATMENT:                                                                                                                              DATE: 08/08/2022    PATIENT EDUCATION: Education details: PT eval results, POC Person educated: Patient Education method: Explanation, Demonstration, and Handouts Education comprehension: verbalized understanding, returned demonstration, and needs further education  HOME EXERCISE PROGRAM: Not yet initiated  GOALS: Goals reviewed with patient? Yes  SHORT TERM GOALS: Target date: 09/02/2022  Pt will be independent with HEP for improved strength, balance, gait. Baseline: Goal status: IN PROGRESS  2.  Pt will improve 5x sit<>stand to less than or equal to 13 sec to demonstrate improved functional strength and transfer efficiency. Baseline: 16.19 sec Goal status: IN PROGRESS  3.  Pt will improve SLS to at least 3 sec for improved balance and functional strength. Baseline: 1-1.5 sec Goal status: IN PROGRESS  LONG TERM GOALS: Target date: 09/16/2022  Pt will be independent with HEP for improved balance, strength, gait. Baseline:  Goal status: IN PROGRESS  2.  Pt will improve gait velocity to at least 2.6 ft/sec for improved gait efficiency and safety. Baseline: 1.95 ft/sec Goal status: IN PROGRESS  3. Pt will improve DGI score to at least 21/24 to decrease fall risk. Baseline: 19/24 Goal status: IN PROGRESS  4.  Pt will perform Condition 4 on MCTSIB for 30 sec with mild sway for improved balance.  Baseline: mod sway Goal status: IN PROGRESS  5.  Pt will verbalize understanding of fall prevention in home  environment.  Baseline:  Goal status: IN PROGRESS  ASSESSMENT:  CLINICAL IMPRESSION: Patient arrived to session bringing shoe inserts to be used in the R shoe. Educated on proper use- patient reported understanding. Worked on static balance activities focusing on narrow BOS and compliant surface training. Also progressed LE strengthening activities with cueing for proper form and alignment. Patient noted some L hip discomfort with squats but able to tolerate. Note understanding of HEP update and without complaints upon leaving.   OBJECTIVE IMPAIRMENTS: Abnormal gait, decreased balance, decreased mobility, difficulty walking, decreased ROM, decreased strength, and impaired flexibility.   ACTIVITY LIMITATIONS: carrying, stairs, transfers, and locomotion level  PARTICIPATION LIMITATIONS: community activity and yard work  PERSONAL FACTORS: 3+ comorbidities: see PMH above  are also affecting patient's functional outcome.   REHAB POTENTIAL: Good  CLINICAL DECISION MAKING: Stable/uncomplicated  EVALUATION COMPLEXITY: Low  PLAN:  PT FREQUENCY: 2x/week  PT DURATION: 6 weeks including eval week  PLANNED INTERVENTIONS: Therapeutic exercises, Therapeutic activity, Neuromuscular re-education, Balance training, Gait training, Patient/Family education, Self Care, Stair training, Orthotic/Fit training, DME instructions, and Manual therapy  PLAN FOR NEXT SESSION: Fully assess Leg length (true/apparent) and address with stretching or ask about insert.  Review HEP, and continue BLE strengthening, SLS/tandem stance, compliant surface balance exercises   Anette Guarneri, PT, DPT 08/16/22 11:45 AM  [facility] at [facility] [address] [place], [address] [place], [place] [address] Phone # [phone] Fax # [phone]

## 2022-08-16 ENCOUNTER — Ambulatory Visit: Payer: Medicare Other | Admitting: Physical Therapy

## 2022-08-16 ENCOUNTER — Encounter: Payer: Self-pay | Admitting: Physical Therapy

## 2022-08-16 DIAGNOSIS — R2689 Other abnormalities of gait and mobility: Secondary | ICD-10-CM | POA: Diagnosis not present

## 2022-08-16 DIAGNOSIS — R2681 Unsteadiness on feet: Secondary | ICD-10-CM

## 2022-08-16 DIAGNOSIS — M6281 Muscle weakness (generalized): Secondary | ICD-10-CM

## 2022-08-18 ENCOUNTER — Ambulatory Visit: Payer: Medicare Other | Admitting: Physical Therapy

## 2022-08-18 ENCOUNTER — Encounter: Payer: Self-pay | Admitting: Physical Therapy

## 2022-08-18 DIAGNOSIS — M6281 Muscle weakness (generalized): Secondary | ICD-10-CM

## 2022-08-18 DIAGNOSIS — R2681 Unsteadiness on feet: Secondary | ICD-10-CM

## 2022-08-18 DIAGNOSIS — R2689 Other abnormalities of gait and mobility: Secondary | ICD-10-CM | POA: Diagnosis not present

## 2022-08-18 NOTE — Therapy (Signed)
OUTPATIENT PHYSICAL THERAPY NEURO TREATMENT NOTE   Patient Name: Mallory Watts MRN: [id number] DOB:04-Apr-1941, 82 y.o., female Today's Date: 08/18/2022   PCP:    Creola Corn, MD   REFERRING PROVIDER: Rodolph Bong, MD   END OF SESSION:  PT End of Session - 08/18/22 1109     Visit Number 4    Number of Visits 12    Date for PT Re-Evaluation 09/16/22    Authorization Type [employer]    Progress Note Due on Visit 10    PT Start Time 1102    PT Stop Time 1142    PT Time Calculation (min) 40 min    Activity Tolerance Patient tolerated treatment well    Behavior During Therapy WFL for tasks assessed/performed                Past Medical History:  Diagnosis Date   Arthritis    Atrial fibrillation (HCC)    not symptomatic -on metoprolol BID for this    CAD (coronary artery disease)    s/p [facility]B 2004 at [facility]   Cancer [facility]) 1998   breast cancer--left   Dysrhythmia    atrial flutter/fib   GERD (gastroesophageal reflux disease)    Hypertension    Osteopenia    Personal history of chemotherapy 1998   Personal history of radiation therapy 1998   PONV (postoperative nausea and vomiting)    Shortness of breath    exertion   STD (sexually transmitted disease)    HSV   Urinary incontinence    Past Surgical History:  Procedure Laterality Date   ANTERIOR CERVICAL DECOMP/DISCECTOMY FUSION N/A 01/14/2014   Procedure: ANTERIOR CERVICAL DECOMPRESSION/DISCECTOMY FUSION CERVICAL FOUR-FIVE,CERVICAL FIVE SIX;  Surgeon: Barnett Abu, MD;  Location: [facility];  Service: Neurosurgery;  Laterality: N/A;   BREAST EXCISIONAL BIOPSY Left    BREAST LUMPECTOMY Left 1998   BREAST SURGERY  1998   left lumpectomy for breast ca   COLONOSCOPY     CORONARY ARTERY BYPASS GRAFT  2004   at [facility]   DILATION AND CURETTAGE OF UTERUS  2006   heart bypass  2003   -single   POLYPECTOMY     TONSILLECTOMY     TOTAL KNEE ARTHROPLASTY Left 11/28/2016   Procedure: LEFT TOTAL KNEE  ARTHROPLASTY;  Surgeon: Ollen Gross, MD;  Location: [facility];  Service: Orthopedics;  Laterality: Left;  Canal block   Patient Active Problem List   Diagnosis Date Noted   Severe obstructive sleep apnea 08/24/2021   CHF (congestive heart failure) (HCC) 08/24/2021   DOE (dyspnea on exertion) 08/24/2021   Chronic cough 08/24/2021   History of left breast cancer 04/19/2021   Status post coronary artery bypass graft 03/30/2018   Coronary artery disease 03/30/2018   Paroxysmal atrial flutter (HCC) 03/30/2018   Dyslipidemia 03/30/2018   OA (osteoarthritis) of knee 11/28/2016   Cervical spondylosis with myelopathy and radiculopathy 01/14/2014   RECTAL BLEEDING 01/07/2009   PERSONAL HX COLONIC POLYPS 01/07/2009   GASTROESOPHAGEAL REFLUX DISEASE, HX OF 08/01/2007   ESOPHAGEAL STRICTURE 05/19/2005   ESOPHAGEAL REFLUX 05/19/2005   GASTRITIS, ACUTE 05/19/2005   ADENOMATOUS COLONIC POLYP 05/09/2005    ONSET DATE: 07/29/2022 (MD referral)  REFERRING DIAG: R26.9 (ICD-10-CM) - Abnormality of gait   THERAPY DIAG:  Muscle weakness (generalized)  Other abnormalities of gait and mobility  Unsteadiness on feet  Rationale for Evaluation and Treatment: Rehabilitation  SUBJECTIVE:  SUBJECTIVE STATEMENT:  I felt like I might have overdone it Tuesday. Put my shoe lift in the right shoe and its feeling good, limiting hours like Erskine Squibb told me. No falls. My ankles felt really sore yesterday and today, I'd really like to focus on balance today.   Pt accompanied by: self  PERTINENT HISTORY: L TKR in 2018 w/ Dr. Lequita Halt, A-fib  PAIN:  Are you having pain? Yes: NPRS scale: 2/10 Pain location: B ankles  Pain description: soreness  Aggravating factors: unsure Relieving factors: nothing   PRECAUTIONS: Fall  WEIGHT  BEARING RESTRICTIONS: No  FALLS: Has patient fallen in last 6 months? No  LIVING ENVIRONMENT: Lives with: lives with an adult companion Lives in: House/apartment Stairs: Yes: Internal: 12 steps; on right going up and on left going up and External: 5 steps; on right going up, on left going up, and can reach both3 levels Has following equipment at home: Single point cane and Grab bars  PLOF: Independent and Leisure: would like to return to neighborhood walking; takes Tai Chi classes  PATIENT GOALS: To feel more secure on my feet.  OBJECTIVE:     TODAY'S TREATMENT 08/18/22  NMR  Nustep L3-4 x6 minutes BLEs only (dropped to L3 due to R knee pain) Tandem stance on blue foam pad 3x30 seconds B Forward toe taps off blue foam pad x20 3 way toe taps off blue foam pad x10 B  Step up and overs blue foam pad x10 B for foot clearance SLS on blue foam pad one foot on 6 inch box 3x30 seconds B  Randomized forward/backward walking  Randomized lateral side stepping left and right  Figure 8 walks x5 CW/x5 CCW      TODAY'S TREATMENT: 08/16/22 Activity Comments  STS 5x, 10x with blue med ball at chest Cues to scoot to edge and keep weight close to chest   SLS 2x30" Light fingertip support   tandem stance 2x30" Weaning to no UE support    romberg foam 30", standing EC 2x30" on foam Good stability EO, mild-mod imbalance with EC  tandem foam 30" each LE 1 fingertip support   1/4 and 1/2 turns to targets in 2-4 steps Performed well and slow and quicker pace   mini squat 2x10 Cueing to shift hips posteriorly; noted some L hip discomfort but able to continue  sidestepping red TB above knees x1 min  Along counter top        HOME EXERCISE PROGRAM Last updated: 08/16/22 Access Code: [email or web address] URL: [email or web address] Date: 08/16/2022 Prepared by: [facility] - Outpatient  Rehab - [facility]  Program Notes standing exercises should be done at counter top for safety.    Exercises - Seated Hamstring Stretch  - 1 x daily - 7 x weekly - 1 sets - 3 reps -  30 sec hold - Staggered Stance Forward Backward Weight Shift with Counter Support  - 1 x daily - 7 x weekly - 2-3 sets - 10 reps - Standing Hip Abduction with Counter Support  - 1 x daily - 5 x weekly - 2-3 sets - 10 reps - Standing Tandem Balance with Counter Support  - 1 x daily - 5 x weekly - 2 sets - 30 sec hold - Standing Balance with Eyes Closed on Foam  - 1 x daily - 5 x weekly - 2 sets - 30 sec hold - 180 Degree Pivot Turn with Single Point Cane  - 1 x daily - 5 x weekly -  2 sets - 10 reps  PATIENT EDUCATION: Education details: encouraged pt to use 1 shoe insert in R shoe and how to adjust to size; to be worn only a couple house a day and slowly increase to tolerance; HEP update with edu on safety Person educated: Patient  Education method: Explanation, Demonstration, Tactile cues, Verbal cues, and Handouts Education comprehension: verbalized understanding and returned demonstration     --------------------------------------------------------------- Objective measures below taken at initial eval:  DIAGNOSTIC FINDINGS: NA  COGNITION: Overall cognitive status: Within functional limits for tasks assessed   SENSATION: WFL   EDEMA:  Reports occasional swelling in ankles.  LOWER EXTREMITY ROM:     Active  Right Eval Left Eval  Hip flexion    Hip extension    Hip abduction    Hip adduction    Hip internal rotation    Hip external rotation    Knee flexion    Knee extension 0 -11 (passive -8)  Ankle dorsiflexion 20 20  Ankle plantarflexion    Ankle inversion    Ankle eversion     (Blank rows = not tested)  LOWER EXTREMITY MMT:    MMT Right Eval Left Eval  Hip flexion 4+ 4  Hip extension    Hip abduction 4+ 4  Hip adduction    Hip internal rotation    Hip external rotation    Knee flexion 4 4  Knee extension 4+ 4  Ankle dorsiflexion 3+ 3+  Ankle plantarflexion     Ankle inversion    Ankle eversion    (Blank rows = not tested)    TRANSFERS: Assistive device utilized: None  Sit to stand: Modified independence Stand to sit: Modified independence   GAIT: Gait pattern:  slight lateral lean to L, pt reports she feels LLE slaps the ground harder than the R, step through pattern, and wide BOS Distance walked: 100 ft Assistive device utilized: None Level of assistance: SBA   FUNCTIONAL TESTS:  5 times sit to stand: 16.19 sec arms crossed at chest Timed up and go (TUG): NT 10 meter walk test: 16.81 sec (1.95 ft/sec) Dynamic Gait Index: 19/24 SLS:  RLE:1.5 sec  LLE: < 1 sec Tandem stance: 6 sec RLE posterior,30 sec LLE posteiror   M-CTSIB  Condition 1: Firm Surface, EO 30 Sec, Normal Sway  Condition 2: Firm Surface, EC 30 Sec, Mild Sway  Condition 3: Foam Surface, EO 30 Sec, Mild Sway  Condition 4: Foam Surface, EC 30 Sec, Moderate Sway     Observed leg length in supine: (after 3 bridging attempts):  Pt's LLE appears approx 1/2 inch longer than RLE.  Will need to further assess.  TODAY'S TREATMENT:                                                                                                                              DATE: 08/08/2022    PATIENT EDUCATION: Education details: PT eval results, POC Person educated:  Patient Education method: Explanation, Demonstration, and Handouts Education comprehension: verbalized understanding, returned demonstration, and needs further education  HOME EXERCISE PROGRAM: Not yet initiated  GOALS: Goals reviewed with patient? Yes  SHORT TERM GOALS: Target date: 09/02/2022  Pt will be independent with HEP for improved strength, balance, gait. Baseline: Goal status: IN PROGRESS  2.  Pt will improve 5x sit<>stand to less than or equal to 13 sec to demonstrate improved functional strength and transfer efficiency. Baseline: 16.19 sec Goal status: IN PROGRESS  3.  Pt will improve SLS to at least  3 sec for improved balance and functional strength. Baseline: 1-1.5 sec Goal status: IN PROGRESS  LONG TERM GOALS: Target date: 09/16/2022  Pt will be independent with HEP for improved balance, strength, gait. Baseline:  Goal status: IN PROGRESS  2.  Pt will improve gait velocity to at least 2.6 ft/sec for improved gait efficiency and safety. Baseline: 1.95 ft/sec Goal status: IN PROGRESS  3. Pt will improve DGI score to at least 21/24 to decrease fall risk. Baseline: 19/24 Goal status: IN PROGRESS  4.  Pt will perform Condition 4 on MCTSIB for 30 sec with mild sway for improved balance.  Baseline: mod sway Goal status: IN PROGRESS  5.  Pt will verbalize understanding of fall prevention in home environment.  Baseline:  Goal status: IN PROGRESS  ASSESSMENT:  CLINICAL IMPRESSION:  Dennie Bible arrives doing OK, wearing shoe lift in R shoe right now. Requested that we focus on balance training today, worked on balance based challenges and tasks per pt request with incorporation of dynamic movements and work on unsteady surfaces as per STG and LTGs. Will continue efforts.  OBJECTIVE IMPAIRMENTS: Abnormal gait, decreased balance, decreased mobility, difficulty walking, decreased ROM, decreased strength, and impaired flexibility.   ACTIVITY LIMITATIONS: carrying, stairs, transfers, and locomotion level  PARTICIPATION LIMITATIONS: community activity and yard work  PERSONAL FACTORS: 3+ comorbidities: see PMH above  are also affecting patient's functional outcome.   REHAB POTENTIAL: Good  CLINICAL DECISION MAKING: Stable/uncomplicated  EVALUATION COMPLEXITY: Low  PLAN:  PT FREQUENCY: 2x/week  PT DURATION: 6 weeks including eval week  PLANNED INTERVENTIONS: Therapeutic exercises, Therapeutic activity, Neuromuscular re-education, Balance training, Gait training, Patient/Family education, Self Care, Stair training, Orthotic/Fit training, DME instructions, and Manual therapy  PLAN FOR  NEXT SESSION: Fully assess Leg length (true/apparent) and address with stretching or ask about insert.  Review HEP, and continue BLE strengthening, SLS/tandem stance, compliant surface balance exercises, dynamic balance    Nedra Hai PT DPT PN2   [facility] at [facility] [address] [place], [address] [place], [place] [address] Phone # [phone] Fax # [phone]

## 2022-08-23 ENCOUNTER — Ambulatory Visit: Payer: Medicare Other

## 2022-08-25 ENCOUNTER — Ambulatory Visit: Payer: Medicare Other | Admitting: Physical Therapy

## 2022-08-25 ENCOUNTER — Telehealth: Payer: Self-pay

## 2022-08-25 NOTE — Telephone Encounter (Signed)
Returned Pt's call and LVM regarding recent MM. Pt asks that we fax report to PCP Dr. Creola Corn. Report faxed to [phone] with receipt confirmation.

## 2022-08-30 ENCOUNTER — Encounter: Payer: Self-pay | Admitting: Rehabilitative and Restorative Service Providers"

## 2022-08-30 ENCOUNTER — Ambulatory Visit: Payer: Medicare Other | Admitting: Rehabilitative and Restorative Service Providers"

## 2022-08-30 DIAGNOSIS — R2689 Other abnormalities of gait and mobility: Secondary | ICD-10-CM | POA: Diagnosis not present

## 2022-08-30 DIAGNOSIS — R2681 Unsteadiness on feet: Secondary | ICD-10-CM

## 2022-08-30 DIAGNOSIS — M6281 Muscle weakness (generalized): Secondary | ICD-10-CM

## 2022-08-30 NOTE — Therapy (Addendum)
OUTPATIENT PHYSICAL THERAPY NEURO TREATMENT NOTE and DISCHARGE SUMMER   Patient Name: Mallory Watts MRN: [id number] DOB:18-Mar-1941, 82 y.o., female Today's Date: 08/30/2022  PCP:    Creola Corn, MD  REFERRING PROVIDER: Rodolph Bong, MD  PHYSICAL THERAPY DISCHARGE SUMMARY  Visits from Start of Care: 5  Current functional level related to goals / functional outcomes: See below for last known patient status.   Remaining deficits: Spoke with patient on the phon and she feels symptoms improved. She is attending tai chi in community to continue to work on balance.   Education / Equipment: HEP.   Patient agrees to discharge. Patient goals were partially met. Patient is being discharged due to not returning since the last visit.    END OF SESSION:  PT End of Session - 08/30/22 0938     Visit Number 5    Number of Visits 12    Date for PT Re-Evaluation 09/16/22    Authorization Type [employer] Medicare    Progress Note Due on Visit 10    PT Start Time [phone]    PT Stop Time 1022    PT Time Calculation (min) 44 min    Activity Tolerance Patient tolerated treatment well    Behavior During Therapy WFL for tasks assessed/performed            Past Medical History:  Diagnosis Date   Arthritis    Atrial fibrillation (HCC)    not symptomatic -on metoprolol BID for this    CAD (coronary artery disease)    s/p CAGB 2004 at [facility]   Cancer [facility]) 1998   breast cancer--left   Dysrhythmia    atrial flutter/fib   GERD (gastroesophageal reflux disease)    Hypertension    Osteopenia    Personal history of chemotherapy 1998   Personal history of radiation therapy 1998   PONV (postoperative nausea and vomiting)    Shortness of breath    exertion   STD (sexually transmitted disease)    HSV   Urinary incontinence    Past Surgical History:  Procedure Laterality Date   ANTERIOR CERVICAL DECOMP/DISCECTOMY FUSION N/A 01/14/2014   Procedure: ANTERIOR CERVICAL DECOMPRESSION/DISCECTOMY  FUSION CERVICAL FOUR-FIVE,CERVICAL FIVE SIX;  Surgeon: Barnett Abu, MD;  Location: [facility] NEURO ORS;  Service: Neurosurgery;  Laterality: N/A;   BREAST EXCISIONAL BIOPSY Left    BREAST LUMPECTOMY Left 1998   BREAST SURGERY  1998   left lumpectomy for breast ca   COLONOSCOPY     CORONARY ARTERY BYPASS GRAFT  2004   at [facility]   DILATION AND CURETTAGE OF UTERUS  2006   heart bypass  2003   -single   POLYPECTOMY     TONSILLECTOMY     TOTAL KNEE ARTHROPLASTY Left 11/28/2016   Procedure: LEFT TOTAL KNEE ARTHROPLASTY;  Surgeon: Ollen Gross, MD;  Location: [facility] ORS;  Service: Orthopedics;  Laterality: Left;  Canal block   Patient Active Problem List   Diagnosis Date Noted   Severe obstructive sleep apnea 08/24/2021   CHF (congestive heart failure) (HCC) 08/24/2021   DOE (dyspnea on exertion) 08/24/2021   Chronic cough 08/24/2021   History of left breast cancer 04/19/2021   Status post coronary artery bypass graft 03/30/2018   Coronary artery disease 03/30/2018   Paroxysmal atrial flutter (HCC) 03/30/2018   Dyslipidemia 03/30/2018   OA (osteoarthritis) of knee 11/28/2016   Cervical spondylosis with myelopathy and radiculopathy 01/14/2014   RECTAL BLEEDING 01/07/2009   PERSONAL HX COLONIC POLYPS 01/07/2009  GASTROESOPHAGEAL REFLUX DISEASE, HX OF 08/01/2007   ESOPHAGEAL STRICTURE 05/19/2005   ESOPHAGEAL REFLUX 05/19/2005   GASTRITIS, ACUTE 05/19/2005   ADENOMATOUS COLONIC POLYP 05/09/2005    ONSET DATE: 07/29/2022 (MD referral)  REFERRING DIAG: R26.9 (ICD-10-CM) - Abnormality of gait   THERAPY DIAG:  Muscle weakness (generalized)  Other abnormalities of gait and mobility  Unsteadiness on feet  Rationale for Evaluation and Treatment: Rehabilitation  SUBJECTIVE:                                                                                                                                                                                             SUBJECTIVE STATEMENT: The patient  notes that the band used in PT bruised her ankles and she prefers not to use a band for exercise.  The patient missed last week waiting for bruising to clear. She also had vertigo last week. She took meds and feels it has improved. Pt accompanied by: self  PERTINENT HISTORY: L TKR in 2018 w/ Dr. Lequita Halt, A-fib  PAIN:  Are you having pain? Yes: NPRS scale: 1-2/10 Pain location: B ankles  Pain description: soreness  Aggravating factors: when exercising Relieving factors: nothing   PRECAUTIONS: Fall *avoid bands around Legs-- led to bruising (is on blood thinner)  WEIGHT BEARING RESTRICTIONS: No  FALLS: Has patient fallen in last 6 months? No  LIVING ENVIRONMENT: Lives with: lives with an adult companion Lives in: House/apartment Stairs: Yes: Internal: 12 steps; on right going up and on left going up and External: 5 steps; on right going up, on left going up, and can reach both3 levels Has following equipment at home: Single point cane and Grab bars  PLOF: Independent and Leisure: would like to return to neighborhood walking; takes Tai Chi classes  PATIENT GOALS: To feel more secure on my feet.  OBJECTIVE:   [facility] Adult PT Treatment:                                                DATE: 08/30/22 Therapeutic Exercise: Sit to stand x 10 reps with 5# ball moving sit>stand with ball at chest>overhead reach in standing>ball back to chest>sitting Heel raises bilaterally without UE support with greater difficulty with R ankle-- x 12 repetitions Neuromuscular re-ed: Rocker board with proactive balance reaching R and L overhead, then with reactive balance with board taps by PT near bar for support Rocker board working on limits of stability shifting hips anteriorly over toes and then posteriorly over heels  in parallel bars with UE support intermittently and CGA Compliant foam with lateral stepping R and L adding eyes closed with CGA for safety Compliant surface step downs with heel reaching x  10 reps Single leg standing in parallel bars between 3-8 seconds without UE support R and L x 3 reps each Gaze standing x 1 viewing x 20 seconds 3 ft from target. The performed chin tuck with small head turns. Habituation after discussion of dizziness during kitchen turning tasks:  360 degree turns x 3 reps R and L without symptoms, ball pass R<>L with therapist posterior to patient x 10 reps each direction, 3 reps of diagonal lunging (low left, high right and switch) to move head heights with bending Gait: Figure 8 walking between cones Backwards walking encouraging longer stride length Marching anteriorly with slow holds for single limb balance Toe walking  x 15 feet with difficulty generating power to maintain heels raised Self Care: Discussed community exercise options. Patient is already engaged in tai chi 2 days/week at [employer] center. Discussed AHOY class offered at [facility] M-Th 9:15am and discussed a potential barrier of crowded class size. Found schedule and noted [facility] also offers at 9:15 MWF.  Discussed goal of 150 minutes/week of exercise and slowly working up to tolerating more activity and adding general strength/conditioning.  HOME EXERCISE PROGRAM Last updated: 08/16/22 Access Code: [email or web address] URL: [email or web address] Date: 08/16/2022 Prepared by: [facility] - Outpatient  Rehab - [facility]  Program Notes standing exercises should be done at counter top for safety.   Exercises - Seated Hamstring Stretch  - 1 x daily - 7 x weekly - 1 sets - 3 reps -  30 sec hold - Staggered Stance Forward Backward Weight Shift with Counter Support  - 1 x daily - 7 x weekly - 2-3 sets - 10 reps - Standing Hip Abduction with Counter Support  - 1 x daily - 5 x weekly - 2-3 sets - 10 reps - Standing Tandem Balance with Counter Support  - 1 x daily - 5 x weekly - 2 sets - 30 sec hold - Standing Balance with Eyes Closed on Foam  - 1 x daily - 5 x weekly - 2 sets - 30  sec hold - 180 Degree Pivot Turn with Single Point Cane  - 1 x daily - 5 x weekly - 2 sets - 10 reps  PATIENT EDUCATION: Education details: encouraged pt to use 1 shoe insert in R shoe and how to adjust to size; to be worn only a couple house a day and slowly increase to tolerance; HEP update with edu on safety Person educated: Patient  Education method: Explanation, Demonstration, Tactile cues, Verbal cues, and Handouts Education comprehension: verbalized understanding and returned demonstration  --------------------------------------------------------------- Objective measures below taken at initial eval:  DIAGNOSTIC FINDINGS: NA  COGNITION: Overall cognitive status: Within functional limits for tasks assessed   SENSATION: WFL  EDEMA:  Reports occasional swelling in ankles.  LOWER EXTREMITY ROM:     Active  Right Eval Left Eval  Hip flexion    Hip extension    Hip abduction    Hip adduction    Hip internal rotation    Hip external rotation    Knee flexion    Knee extension 0 -11 (passive -8)  Ankle dorsiflexion 20 20  Ankle plantarflexion    Ankle inversion    Ankle eversion     (Blank rows = not tested)  LOWER EXTREMITY MMT:  MMT Right Eval Left Eval  Hip flexion 4+ 4  Hip extension    Hip abduction 4+ 4  Hip adduction    Hip internal rotation    Hip external rotation    Knee flexion 4 4  Knee extension 4+ 4  Ankle dorsiflexion 3+ 3+  Ankle plantarflexion    Ankle inversion    Ankle eversion    (Blank rows = not tested)  TRANSFERS: Assistive device utilized: None  Sit to stand: Modified independence Stand to sit: Modified independence  GAIT: Gait pattern:  slight lateral lean to L, pt reports she feels LLE slaps the ground harder than the R, step through pattern, and wide BOS Distance walked: 100 ft Assistive device utilized: None Level of assistance: SBA  FUNCTIONAL TESTS:  5 times sit to stand: 16.19 sec arms crossed at chest Timed up  and go (TUG): NT 10 meter walk test: 16.81 sec (1.95 ft/sec) Dynamic Gait Index: 19/24 SLS:  RLE:1.5 sec  LLE: < 1 sec Tandem stance: 6 sec RLE posterior,30 sec LLE posteiror  M-CTSIB  Condition 1: Firm Surface, EO 30 Sec, Normal Sway  Condition 2: Firm Surface, EC 30 Sec, Mild Sway  Condition 3: Foam Surface, EO 30 Sec, Mild Sway  Condition 4: Foam Surface, EC 30 Sec, Moderate Sway    Observed leg length in supine: (after 3 bridging attempts):  Pt's LLE appears approx 1/2 inch longer than RLE.  Will need to further assess.  GOALS: Goals reviewed with patient? Yes  SHORT TERM GOALS: Target date: 09/02/2022  Pt will be independent with HEP for improved strength, balance, gait. Baseline: Goal status: IN PROGRESS  2.  Pt will improve 5x sit<>stand to less than or equal to 13 sec to demonstrate improved functional strength and transfer efficiency. Baseline: 16.19 sec Goal status: IN PROGRESS  3.  Pt will improve SLS to at least 3 sec for improved balance and functional strength. Baseline: 1-1.5 sec Goal status: IN PROGRESS  LONG TERM GOALS: Target date: 09/16/2022  Pt will be independent with HEP for improved balance, strength, gait. Baseline:  Goal status: IN PROGRESS  2.  Pt will improve gait velocity to at least 2.6 ft/sec for improved gait efficiency and safety. Baseline: 1.95 ft/sec Goal status: IN PROGRESS  3. Pt will improve DGI score to at least 21/24 to decrease fall risk. Baseline: 19/24 Goal status: IN PROGRESS  4.  Pt will perform Condition 4 on MCTSIB for 30 sec with mild sway for improved balance.  Baseline: mod sway Goal status: IN PROGRESS  5.  Pt will verbalize understanding of fall prevention in home environment.  Baseline:  Goal status: IN PROGRESS  ASSESSMENT:  CLINICAL IMPRESSION: The patient tolerated exercises well in therapy rating a "moderate" level of effort. Patient has difficulty generating power through L.E.s with toe walking. She  tolerated gaze x 1 viewing well with more c/o neck popping and tightness. PT discussed benefits of general strength/conditioning and reviewed schedule for [place]/rec classes. Plan to progress to STGs and LTGs. OBJECTIVE IMPAIRMENTS: Abnormal gait, decreased balance, decreased mobility, difficulty walking, decreased ROM, decreased strength, and impaired flexibility.   PLAN:  PT FREQUENCY: 2x/week  PT DURATION: 6 weeks including eval week  PLANNED INTERVENTIONS: Therapeutic exercises, Therapeutic activity, Neuromuscular re-education, Balance training, Gait training, Patient/Family education, Self Care, Stair training, Orthotic/Fit training, DME instructions, and Manual therapy  PLAN FOR NEXT SESSION: Fully assess Leg length (true/apparent) and address with stretching or ask about insert.  Review HEP,  and continue BLE strengthening, SLS/tandem stance, compliant surface balance exercises, dynamic balance.   Margretta Ditty, PT 08/30/2022 [facility] at [facility] [address] [place], [address] [place], [place] [address] Phone # [phone] Fax # [phone]

## 2022-09-02 ENCOUNTER — Ambulatory Visit: Payer: Medicare Other | Admitting: Physical Therapy

## 2022-09-06 ENCOUNTER — Ambulatory Visit: Payer: Medicare Other | Admitting: Physical Therapy

## 2022-09-07 ENCOUNTER — Ambulatory Visit: Payer: Medicare Other | Admitting: Family Medicine

## 2022-09-09 ENCOUNTER — Ambulatory Visit: Payer: Medicare Other | Admitting: Physical Therapy

## 2022-09-12 ENCOUNTER — Encounter: Payer: Self-pay | Admitting: Family Medicine

## 2022-09-12 ENCOUNTER — Ambulatory Visit: Payer: Medicare Other | Admitting: Family Medicine

## 2022-09-12 VITALS — BP 148/66 | HR 69 | Ht 62.0 in | Wt 178.0 lb

## 2022-09-12 DIAGNOSIS — R269 Unspecified abnormalities of gait and mobility: Secondary | ICD-10-CM | POA: Diagnosis not present

## 2022-09-12 NOTE — Patient Instructions (Signed)
Thank you for coming in today.   Keep working the exercises.   Recheck with me as needed. We can do more if we need to.   I could re-order PT in the future if you would like.   The good feet store or the shoe market with a good sales person could help.

## 2022-09-12 NOTE — Progress Notes (Signed)
   Rubin Payor, PhD, LAT, ATC acting as a scribe for Clementeen Graham, MD.  Mallory Watts is a 82 y.o. female who presents to [facility] at [facility] today for f/u gait abnormality and balance problems. Pt underwent a L TKR in 2018 w/ Dr. Lequita Halt. Pt was last seen by Dr. Denyse Amass on 07/29/22 and was referred to neuro PT, completing 5 visits (last visit was May 21st). Today, pt reports she had to cancel several PT visits because she was experiencing vertigo. She reports PT noticed a leg length discrepancy, L>R. PT advised to add an insole, which she feels is helping.  She is feeling much stronger and more sure of herself walking.    Pertinent review of systems: No fevers or chills  Relevant historical information: Heart failure.  History of knee replacement.   Exam:  BP (!) 148/66   Pulse 69   Ht 5\' 2"  (1.575 m)   Wt 178 lb (80.7 kg)   LMP 04/11/1994   SpO2 97%   BMI 32.56 kg/m  General: Well Developed, well nourished, and in no acute distress.   MSK: Normal gait.      Assessment and Plan: 82 y.o. female with abnormal gait and some weakness following knee replacement several years ago.  She has had significant improvement with trial of PT.  She ended somewhat abruptly and could benefit from more physical therapy in the future if needed.  She is working on home exercises for now and is pretty happy with how things are going.  She will let me know if she wants a repeat order PT.  Check back as needed. Total encounter time 20 minutes including face-to-face time with the patient and, reviewing past medical record, and charting on the date of service.      Discussed warning signs or symptoms. Please see discharge instructions. Patient expresses understanding.   The above documentation has been reviewed and is accurate and complete Clementeen Graham, M.D.

## 2022-10-03 ENCOUNTER — Other Ambulatory Visit: Payer: Self-pay | Admitting: Internal Medicine

## 2022-10-03 MED ORDER — METOPROLOL TARTRATE 25 MG PO TABS: 25.0000 mg | ORAL_TABLET | Freq: Two times a day (BID) | ORAL | 2 refills | Status: DC

## 2022-10-05 ENCOUNTER — Other Ambulatory Visit: Payer: Self-pay | Admitting: Internal Medicine

## 2022-10-12 ENCOUNTER — Telehealth: Payer: Self-pay | Admitting: Family Medicine

## 2022-10-12 ENCOUNTER — Encounter (HOSPITAL_COMMUNITY): Payer: Self-pay

## 2022-10-12 ENCOUNTER — Ambulatory Visit (INDEPENDENT_AMBULATORY_CARE_PROVIDER_SITE_OTHER): Payer: Medicare Other

## 2022-10-12 DIAGNOSIS — M25572 Pain in left ankle and joints of left foot: Secondary | ICD-10-CM | POA: Diagnosis not present

## 2022-10-12 DIAGNOSIS — G8929 Other chronic pain: Secondary | ICD-10-CM

## 2022-10-12 NOTE — Telephone Encounter (Signed)
Order has been placed.

## 2022-10-12 NOTE — Telephone Encounter (Signed)
We are happy to place an order for a X-Ray of her ankle.   Please confirm which ankle needs to be x-rayed and if she would like to have it done at the [facility] location.

## 2022-10-12 NOTE — Telephone Encounter (Signed)
Left ankle. Patient will come today to have it done.

## 2022-10-12 NOTE — Telephone Encounter (Signed)
Patient called stating that her ankle is still bothering her and asked if we could order an xray?  Please advise.

## 2022-10-18 ENCOUNTER — Encounter: Payer: Self-pay | Admitting: Family Medicine

## 2022-10-18 ENCOUNTER — Ambulatory Visit (HOSPITAL_COMMUNITY)
Admission: RE | Admit: 2022-10-18 | Discharge: 2022-10-18 | Disposition: A | Discharge status: Home / Self Care | Payer: Medicare Other | Source: Ambulatory Visit | Attending: Internal Medicine | Admitting: Internal Medicine

## 2022-10-18 DIAGNOSIS — I2510 Atherosclerotic heart disease of native coronary artery without angina pectoris: Secondary | ICD-10-CM | POA: Diagnosis not present

## 2022-10-18 DIAGNOSIS — G8929 Other chronic pain: Secondary | ICD-10-CM

## 2022-10-18 MED ORDER — REGADENOSON 0.4 MG/5ML IV SOLN
INTRAVENOUS | Status: AC
  Filled 2022-10-18: qty 5

## 2022-10-18 MED ORDER — REGADENOSON 0.4 MG/5ML IV SOLN
0.4000 mg | Freq: Once | INTRAVENOUS | Status: AC
  Administered 2022-10-18: 0.4 mg via INTRAVENOUS

## 2022-10-18 MED ORDER — RUBIDIUM RB82 GENERATOR (RUBYFILL)
25.0000 | PACK | Freq: Once | INTRAVENOUS | Status: AC
  Administered 2022-10-18: 20.95 via INTRAVENOUS

## 2022-10-18 MED ORDER — RUBIDIUM RB82 GENERATOR (RUBYFILL)
25.0000 | PACK | Freq: Once | INTRAVENOUS | Status: AC
  Administered 2022-10-18: 20.93 via INTRAVENOUS

## 2022-10-18 NOTE — Progress Notes (Signed)
Tolerated test well 

## 2022-10-18 NOTE — Progress Notes (Signed)
Left ankle x-ray looks normal to radiology.  You do have swelling in the joint but the bones look okay.

## 2022-10-19 LAB — NM PET CT CARDIAC PERFUSION MULTI W/ABSOLUTE BLOODFLOW
LV dias vol: 87 mL (ref 46–106)
LV sys vol: 32 mL
MBFR: 2.43
Nuc Rest EF: 59 %
Nuc Stress EF: 63 %
Peak HR: 85 {beats}/min
Rest HR: 67 {beats}/min
Rest MBF: 0.8 ml/g/min
Rest Nuclear Isotope Dose: 21 mCi
ST Depression (mm): 0 mm
Stress MBF: 1.94 ml/g/min
Stress Nuclear Isotope Dose: 21 mCi
TID: 1.07

## 2022-11-15 ENCOUNTER — Other Ambulatory Visit: Payer: Self-pay | Admitting: Internal Medicine

## 2022-11-15 NOTE — Telephone Encounter (Signed)
Xray ordered. She will be getting xray today

## 2022-11-28 ENCOUNTER — Other Ambulatory Visit: Payer: Self-pay | Admitting: Nurse Practitioner

## 2022-11-28 DIAGNOSIS — R053 Chronic cough: Secondary | ICD-10-CM

## 2022-11-28 DIAGNOSIS — R0609 Other forms of dyspnea: Secondary | ICD-10-CM

## 2022-12-13 DIAGNOSIS — I480 Paroxysmal atrial fibrillation: Secondary | ICD-10-CM | POA: Insufficient documentation

## 2022-12-13 DIAGNOSIS — I351 Nonrheumatic aortic (valve) insufficiency: Secondary | ICD-10-CM | POA: Insufficient documentation

## 2022-12-13 NOTE — Progress Notes (Deleted)
Cardiology Office Note:    Date:  12/13/2022  ID:  Mallory Watts, DOB 11/18/1940, MRN [id number] PCP: Creola Corn, MD  [facility] Providers Cardiologist:  Dietrich Pates, MD { Click to update primary MD,subspecialty MD or APP then REFRESH:1}    {Click to Open Review  :1}   Patient Profile:      Coronary artery disease s/p CABG in 2004 Myocardial PET 10/18/22: no ischemia or infarction, EF 59, global myocardial blood flow reserve normal; low risk Paroxysmal atrial fibrillation  Rx: Multaq Monitor 07/2022: NSR, rare PAC, PVCs Aortic insufficiency  TTE 07/23/21: EF 60-65, no RWMA, mild LVH, NL RVSF, mod LAE, severe RAE, mild MR, mild to mod TR, mild to mod AI, RAP 3  Hypertension  Hyperlipidemia  Diabetes mellitus  Central sleep apnea on BiPap L breast CA       {  FU for CAD, AF, AI Last saw PR 06/2022 - pt w shortness of breath  PET normal; monitor w NSR, no AF Now seen for edema  08/2021: LDL 55      :1}   History of Present Illness:  Discussed the use of AI scribe software for clinical note transcription with the patient, who gave verbal consent to proceed.  History of Present Illness          ROS: ***    Studies Reviewed:       *** Risk Assessment/Calculations:   {Does this patient have ATRIAL FIBRILLATION?:[phone]} No BP recorded.  {Refresh Note OR Click here to enter BP  :1}***       Physical Exam:   VS:  LMP 04/11/1994    Wt Readings from Last 3 Encounters:  09/12/22 178 lb (80.7 kg)  07/29/22 176 lb (79.8 kg)  07/04/22 176 lb 12.8 oz (80.2 kg)    Physical Exam***     Assessment and Plan:  No problem-specific Assessment & Plan notes found for this encounter. Assessment and Plan            {      :1}    {Are you ordering a CV Procedure (e.g. stress test, cath, DCCV, TEE, etc)?   Press F2        :[id number]}  Dispo:  No follow-ups on file.  Signed, Tereso Newcomer, PA-C

## 2022-12-14 ENCOUNTER — Ambulatory Visit: Payer: Medicare Other | Attending: Physician Assistant | Admitting: Physician Assistant

## 2022-12-14 DIAGNOSIS — I480 Paroxysmal atrial fibrillation: Secondary | ICD-10-CM

## 2022-12-14 DIAGNOSIS — I2510 Atherosclerotic heart disease of native coronary artery without angina pectoris: Secondary | ICD-10-CM

## 2022-12-14 DIAGNOSIS — I351 Nonrheumatic aortic (valve) insufficiency: Secondary | ICD-10-CM

## 2023-02-17 ENCOUNTER — Ambulatory Visit (INDEPENDENT_AMBULATORY_CARE_PROVIDER_SITE_OTHER): Payer: Medicare Other | Admitting: Radiology

## 2023-02-17 VITALS — BP 124/62

## 2023-02-17 DIAGNOSIS — N958 Other specified menopausal and perimenopausal disorders: Secondary | ICD-10-CM | POA: Diagnosis not present

## 2023-02-17 DIAGNOSIS — N760 Acute vaginitis: Secondary | ICD-10-CM | POA: Diagnosis not present

## 2023-02-17 LAB — WET PREP FOR TRICH, YEAST, CLUE

## 2023-02-17 MED ORDER — FLUCONAZOLE 150 MG PO TABS
150.0000 mg | ORAL_TABLET | Freq: Once | ORAL | 0 refills | Status: AC
Start: 2023-02-17 — End: 2023-02-17

## 2023-02-17 MED ORDER — ESTRADIOL 0.1 MG/GM VA CREA
1.0000 g | TOPICAL_CREAM | VAGINAL | 12 refills | Status: AC
Start: 2023-02-17 — End: ?

## 2023-02-17 NOTE — Progress Notes (Signed)
      Subjective: Mallory Watts is a 82 y.o. female who complains of thick vaginal discharge, itching, external irritation, no odor. Took antibiotics 10/mid/24 for URI. Used monistat 2 weeks ago with little relief.   Review of Systems  All other systems reviewed and are negative.   Past Medical History:  Diagnosis Date   Arthritis    Atrial fibrillation (HCC)    not symptomatic -on metoprolol BID for this    CAD (coronary artery disease)    s/p CAGB 2004 at [facility]   Cancer [facility]) 1998   breast cancer--left   Dysrhythmia    atrial flutter/fib   GERD (gastroesophageal reflux disease)    Hypertension    Osteopenia    Personal history of chemotherapy 1998   Personal history of radiation therapy 1998   PONV (postoperative nausea and vomiting)    Shortness of breath    exertion   STD (sexually transmitted disease)    HSV   Urinary incontinence       Objective:  Today's Vitals   02/17/23 1028  BP: 124/62   There is no height or weight on file to calculate BMI.   Physical Exam Vitals and nursing note reviewed. Exam conducted with a chaperone present.  Constitutional:      Appearance: Normal appearance. She is well-developed.  Pulmonary:     Effort: Pulmonary effort is normal.  Abdominal:     General: Abdomen is flat.     Palpations: Abdomen is soft.  Genitourinary:    General: Normal vulva.     Vagina: Vaginal discharge and erythema present. No bleeding or lesions.     Cervix: Normal. No discharge, friability, lesion or erythema.     Uterus: Normal.      Adnexa: Right adnexa normal and left adnexa normal.     Comments: Severely atrophic vaginal tissue with erythema Neurological:     Mental Status: She is alert.  Psychiatric:        Mood and Affect: Mood normal.        Thought Content: Thought content normal.        Judgment: Judgment normal.    Microscopic wet-mount exam shows negative for pathogens, normal epithelial cells.   Raynelle Fanning, CMA present for  exam  Assessment:/Plan:  1. Acute vaginitis - WET PREP FOR TRICH, YEAST, CLUE - fluconazole (DIFLUCAN) 150 MG tablet; Take 1 tablet (150 mg total) by mouth once for 1 dose.  Dispense: 1 tablet; Refill: 0  2. Genitourinary syndrome of menopause - estradiol (ESTRACE VAGINAL) 0.1 MG/GM vaginal cream; Place 1 g vaginally 3 (three) times a week.  Dispense: 42.5 g; Refill: 12   Avoid the use of soaps or perfumed products in the peri area. Avoid tub baths and sitting in sweaty or wet clothing for prolonged periods of time.     Earvin Blazier B, NP 10:35 AM

## 2023-03-21 ENCOUNTER — Ambulatory Visit: Payer: Medicare Other | Admitting: Primary Care

## 2023-03-21 ENCOUNTER — Encounter: Payer: Self-pay | Admitting: Primary Care

## 2023-03-21 VITALS — BP 145/77 | HR 72 | Temp 97.1°F | Ht 63.0 in | Wt 184.4 lb

## 2023-03-21 DIAGNOSIS — G4700 Insomnia, unspecified: Secondary | ICD-10-CM | POA: Diagnosis not present

## 2023-03-21 DIAGNOSIS — G4733 Obstructive sleep apnea (adult) (pediatric): Secondary | ICD-10-CM

## 2023-03-21 DIAGNOSIS — R051 Acute cough: Secondary | ICD-10-CM | POA: Diagnosis not present

## 2023-03-21 NOTE — Progress Notes (Addendum)
@Patient  ID: Mallory Watts, female    DOB: Apr 05, 1941, 82 y.o.   MRN: [id number]  No chief complaint on file.   Referring provider: Creola Corn, MD  HPI: 82 year old female, former smoker.  Past medical history significant for OSA on BiPAP.  Former patient of Dr. Craige Cotta, last seen in September 2023.  03/21/2023 Discussed the use of AI scribe software for clinical note transcription with the patient, who gave verbal consent to proceed.  History of Present Illness   The patient, previously diagnosed with sleep apnea and managed with BiPAP, presents with concerns about her BiPAP machine and a new onset cough. She reports that her BiPAP machine seems to shut off during the night, causing her to feel like she is breathing her own breath back in. This issue started approximately three weeks ago. The patient also mentions that she has not received new supplies for her BiPAP machine as expected. She uses a full face mask, which she changes monthly, and she believes the mask size and fit are appropriate.  In addition to the BiPAP issues, the patient reports a new onset cough that started as an irritated throat and has now progressed to involve the chest. This cough started within the last two weeks, but the patient notes a similar episode last month that resolved with prednisone. The patient describes the current cough as similar to previous experiences with bronchitis.  The patient also mentions a change in sleep medication from Lunesta to Ambien, which she reports has improved her sleep quality. She does not know the exact dosage of the Ambien, but she reports less grogginess the next day compared to when she was on Lunesta.  Airview download 02/19/2023 - 03/20/2023 Usage days 29/30 days (97%) Average usage days used 5 5 minutes IPAP 17 cm H2O/EPAP 13 cm H2O Air leaks 28.2 L/min (95%) AHI 22.8    Sleep testing PSG 10/11/18 >> AHI 58.7, SpO2 low 80% HST 08/16/21 >> AHI 47.4, SpO2 low  72% Bipap titration 10/18/21 >> Bipap 19/15 cm H2O >> AHI 3.3, +R, +S. Bipap 11/20/21 to 12/19/21 >> used on 25 of 30 nights with average 5 hrs 27 min.  Average AHI 24.6 with Bipap 19/15 cm H2O.  Air leak noted  Allergies  Allergen Reactions   Atenolol Other (See Comments)   Gadolinium Nausea Only and Other (See Comments)     Desc: PATIENT FELT DIZZY AND NAUSEOUS AFTER GAD INJECTION. NO VOMITING,NO HIVES, NO RASH., Onset Date: [id number]    Sulfonamide Derivatives     Other Reaction(s): hives    Immunization History  Administered Date(s) Administered   Influenza Split 01/11/2010, 02/12/2010, 03/09/2011, 01/20/2012, 03/01/2013, 03/17/2014   Influenza, High Dose Seasonal PF 03/17/2014   Influenza, Quadrivalent, Recombinant, Inj, Pf 12/04/2017, 11/28/2018   Influenza,inj,Quad PF,6+ Mos 03/29/2016   Influenza,inj,quad, With Preservative 12/11/2018   Influenza-Unspecified 03/02/2017, 01/23/2018, 11/28/2018   PFIZER(Purple Top)SARS-COV-2 Vaccination 05/01/2019, 05/22/2019, 06/12/2020   Pneumococcal Conjugate-13 10/04/2013   Zoster Recombinant(Shingrix) 06/12/2020, 11/10/2020   Zoster, Live 09/25/2007    Past Medical History:  Diagnosis Date   Arthritis    Atrial fibrillation (HCC)    not symptomatic -on metoprolol BID for this    CAD (coronary artery disease)    s/p CAGB 2004 at [facility]   Cancer [facility]) 1998   breast cancer--left   Dysrhythmia    atrial flutter/fib   GERD (gastroesophageal reflux disease)    Hypertension    Osteopenia    Personal history of chemotherapy 1998  Personal history of radiation therapy 1998   PONV (postoperative nausea and vomiting)    Shortness of breath    exertion   STD (sexually transmitted disease)    HSV   Urinary incontinence     Tobacco History: Social History   Tobacco Use  Smoking Status Former  Smokeless Tobacco Never  Tobacco Comments   smoked in college    Counseling given: Not Answered Tobacco comments: smoked in college     Outpatient Medications Prior to Visit  Medication Sig Dispense Refill   acetaminophen (TYLENOL) 650 MG CR tablet Take 1,300 mg by mouth 2 (two) times daily as needed for pain.     alendronate (FOSAMAX) 70 MG tablet Take 70 mg by mouth once a week. Take with a full glass of water on an empty stomach.     apixaban (ELIQUIS) 5 MG TABS tablet Take 1 tablet (5 mg total) by mouth 2 (two) times daily. 60 tablet 5   atorvastatin (LIPITOR) 80 MG tablet TAKE 1 TABLET BY MOUTH EVERY DAY 90 tablet 3   Calcium Carbonate Antacid (TUMS PO) Take 1 tablet by mouth daily.     dronedarone (MULTAQ) 400 MG tablet TAKE 1 TABLET (400 MG TOTAL) BY MOUTH 2 (TWO) TIMES DAILY WITH A MEAL. 180 tablet 2   estradiol (ESTRACE VAGINAL) 0.1 MG/GM vaginal cream Place 1 g vaginally 3 (three) times a week. 42.5 g 12   eszopiclone (LUNESTA) 2 MG TABS tablet Take 2 mg by mouth at bedtime. (Patient not taking: Reported on 02/17/2023)     ezetimibe (ZETIA) 10 MG tablet TAKE 1 TABLET BY MOUTH EVERY DAY 90 tablet 3   fexofenadine (ALLEGRA) 180 MG tablet Take 180 mg by mouth daily as needed.      furosemide (LASIX) 40 MG tablet TAKE 1& 1/2 TABLETS BY MOUTH EVERY OTHER DAY. 75 tablet 3   Magnesium 250 MG TABS Take 250 mg by mouth daily as needed (LEG CRAMPS). Reported on 05/01/2015     meclizine (ANTIVERT) 12.5 MG tablet Take 12.5 mg by mouth 3 (three) times daily as needed for dizziness.     metoprolol tartrate (LOPRESSOR) 25 MG tablet Take 1 tablet (25 mg total) by mouth 2 (two) times daily. 180 tablet 2   mometasone (NASONEX) 50 MCG/ACT nasal spray PLACE 2 SPRAYS INTO THE NOSE DAILY. 51 each 1   nitroGLYCERIN (NITROSTAT) 0.4 MG SL tablet PLACE 1 TABLET UNDER THE TONGUE EVERY 5 MINUTES AS NEEDED FOR CHEST PAIN. 25 tablet 2   omeprazole (PRILOSEC) 40 MG capsule Take 40 mg by mouth daily.  3   potassium chloride SA (KLOR-CON M) 20 MEQ tablet Take 1 tablet (20 mEq total) by mouth 2 (two) times daily. 180 tablet 3   solifenacin  (VESICARE) 5 MG tablet One daily as needed 90 tablet 3   triamterene-hydrochlorothiazide (MAXZIDE-25) 37.5-25 MG tablet Take 1 tablet by mouth daily.     valACYclovir (VALTREX) 500 MG tablet Take 1 tablet (500 mg total) by mouth daily. Take one tablet by mouth twice a day for 3 days as needed for an outbreak. 120 tablet 3   No facility-administered medications prior to visit.    Review of Systems  Review of Systems  Constitutional: Negative.   HENT: Negative.  Negative for postnasal drip.   Respiratory:  Positive for cough. Negative for chest tightness, shortness of breath and wheezing.   Cardiovascular: Negative.     Physical Exam  LMP 04/11/1994  Physical Exam Constitutional:  Appearance: Normal appearance.  HENT:     Head: Normocephalic and atraumatic.  Cardiovascular:     Rate and Rhythm: Normal rate and regular rhythm.  Pulmonary:     Effort: Pulmonary effort is normal.     Breath sounds: Normal breath sounds. No wheezing or rhonchi.  Neurological:     General: No focal deficit present.     Mental Status: She is alert and oriented to person, place, and time. Mental status is at baseline.  Psychiatric:        Mood and Affect: Mood normal.        Behavior: Behavior normal.        Thought Content: Thought content normal.        Judgment: Judgment normal.      Lab Results:  CBC    Component Value Date/Time   WBC 9.0 06/16/2021 0551   RBC 4.62 06/16/2021 0551   HGB 13.3 06/16/2021 0551   HGB 14.5 10/19/2018 0924   HGB 13.0 08/10/2010 1412   HCT 41.9 06/16/2021 0551   HCT 44.1 10/19/2018 0924   HCT 38.3 08/10/2010 1412   PLT 243 06/16/2021 0551   PLT 240 10/19/2018 0924   MCV 90.7 06/16/2021 0551   MCV 88 10/19/2018 0924   MCV 94.8 08/10/2010 1412   MCH 28.8 06/16/2021 0551   MCHC 31.7 06/16/2021 0551   RDW 14.7 06/16/2021 0551   RDW 14.3 10/19/2018 0924   RDW 13.8 08/10/2010 1412   LYMPHSABS 2.5 06/16/2021 0551   LYMPHSABS 1.9 08/10/2010 1412    MONOABS 1.0 06/16/2021 0551   MONOABS 0.4 08/10/2010 1412   EOSABS 0.1 06/16/2021 0551   EOSABS 0.1 08/10/2010 1412   BASOSABS 0.0 06/16/2021 0551   BASOSABS 0.0 08/10/2010 1412    BMET    Component Value Date/Time   NA 140 08/31/2021 1216   K 4.3 08/31/2021 1216   CL 103 08/31/2021 1216   CO2 24 08/31/2021 1216   GLUCOSE 91 08/31/2021 1216   GLUCOSE 115 (H) 06/16/2021 0551   BUN 18 08/31/2021 1216   CREATININE 1.08 (H) 08/31/2021 1216   CALCIUM 9.2 08/31/2021 1216   GFRNONAA 34 (L) 06/16/2021 0551   GFRAA 48 (L) 10/19/2018 0924    BNP    Component Value Date/Time   BNP 197.6 (H) 06/16/2021 0551    ProBNP    Component Value Date/Time   PROBNP 1,438 (H) 08/13/2021 1022    Imaging: No results found.   Assessment & Plan:   1. OSA treated with BiPAP - AMB REFERRAL FOR DME     Obstructive Sleep Apnea Patient reports BiPAP machine may be shutting off during the night. Patient is compliant with BiPAP use but is experiencing residual apneic events on current pressure setting 17/13cm h20. Previous pressure adjustment did not improve apnea score. -Contact [facility] to service BiPAP machine. -If no improvement, needs repeat sleep lab titration study. -Renew CPAP supplies with DME  Acute Cough New onset cough with phlegm production 2 weeks, similar to previous episode that resolved with prednisone. No associated fever, shortness of breath, chest tightenss, PND or reflux symptoms. Lungs clear on exam. No significant findings on pulmonary function tests except decreased diffusion defect which normalized when correlated for lung volumes. -Trial of Robitussin DM for symptomatic relief. Notify office if no improvement, may consider as needed ICS/SABA  BiPAP Supplies Patient reports not receiving new supplies since November. -Renew BiPAP supplies, specifically the hose.  Insomnia Patient recently switched from Lunesta to Ambien 10mg   for sleep, which has improved sleep quality   -Continue Ambien 10mg  as needed for sleep.      Glenford Bayley, NP 03/21/2023

## 2023-03-21 NOTE — Patient Instructions (Signed)
-  OBSTRUCTIVE SLEEP APNEA: Obstructive sleep apnea is a condition where your breathing repeatedly stops and starts during sleep. Please contact [facility] to service your BiPAP machine. If there is no improvement, recommend repeating BIPAP titration study.  -ACUTE COUGH: We recommend trying Robitussin DM for symptomatic relief. If there is no improvement, notify office  -BIPAP SUPPLIES: You mentioned not receiving new BiPAP supplies since November. We will renew your BiPAP supplies, specifically the hose.  -INSOMNIA: Insomnia is difficulty falling or staying asleep. You recently switched from Lunesta to Ambien 10mg , which has improved your sleep quality and reduced daytime grogginess. Continue taking Ambien 10mg  as needed for sleep.  INSTRUCTIONS:  Please follow up with [facility] to service your BiPAP machine and ensure you receive the renewed supplies. If your cough does not improve with Robitussin DM, schedule an appointment with your primary care provider for further evaluation. Continue taking Ambien 10mg  as needed for sleep.

## 2023-03-22 ENCOUNTER — Telehealth: Payer: Self-pay | Admitting: Internal Medicine

## 2023-03-22 NOTE — Telephone Encounter (Signed)
Patient reports cough, shortness of breath and lightheadedness. She states that cough has been productive. She does report her heart racing at times. She is taking Robitussin DM. She did see her pulmonologist yesterday and was advised to contact them back if she did not have any improvement in symptoms. She will also contact her PCP.

## 2023-03-22 NOTE — Telephone Encounter (Signed)
Pt c/o Shortness Of Breath: STAT if SOB developed within the last 24 hours or pt is noticeably SOB on the phone  1. Are you currently SOB (can you hear that pt is SOB on the phone)? no  2. How long have you been experiencing SOB? 1 Week  3. Are you SOB when sitting or when up moving around? Moving around  4. Are you currently experiencing any other symptoms? Fatigue, heart racing, light headed  Pt states she wanted to come in but she has a really bad cold so I told her I would send this note to the nurse first.

## 2023-04-02 ENCOUNTER — Other Ambulatory Visit: Payer: Self-pay

## 2023-04-02 ENCOUNTER — Emergency Department (HOSPITAL_COMMUNITY): Payer: Medicare Other

## 2023-04-02 ENCOUNTER — Emergency Department (HOSPITAL_COMMUNITY)
Admission: EM | Admit: 2023-04-02 | Discharge: 2023-04-02 | Disposition: A | Discharge status: Home / Self Care | Payer: Medicare Other | Attending: Emergency Medicine | Admitting: Emergency Medicine

## 2023-04-02 ENCOUNTER — Encounter (HOSPITAL_COMMUNITY): Payer: Self-pay

## 2023-04-02 DIAGNOSIS — W07XXXA Fall from chair, initial encounter: Secondary | ICD-10-CM | POA: Insufficient documentation

## 2023-04-02 DIAGNOSIS — Z951 Presence of aortocoronary bypass graft: Secondary | ICD-10-CM | POA: Insufficient documentation

## 2023-04-02 DIAGNOSIS — R0989 Other specified symptoms and signs involving the circulatory and respiratory systems: Secondary | ICD-10-CM | POA: Insufficient documentation

## 2023-04-02 DIAGNOSIS — R059 Cough, unspecified: Secondary | ICD-10-CM | POA: Diagnosis not present

## 2023-04-02 DIAGNOSIS — I509 Heart failure, unspecified: Secondary | ICD-10-CM | POA: Diagnosis not present

## 2023-04-02 DIAGNOSIS — I6523 Occlusion and stenosis of bilateral carotid arteries: Secondary | ICD-10-CM | POA: Insufficient documentation

## 2023-04-02 DIAGNOSIS — S0083XA Contusion of other part of head, initial encounter: Secondary | ICD-10-CM | POA: Insufficient documentation

## 2023-04-02 DIAGNOSIS — Z1152 Encounter for screening for COVID-19: Secondary | ICD-10-CM | POA: Insufficient documentation

## 2023-04-02 DIAGNOSIS — H5712 Ocular pain, left eye: Secondary | ICD-10-CM | POA: Diagnosis present

## 2023-04-02 LAB — RESP PANEL BY RT-PCR (RSV, FLU A&B, COVID)  RVPGX2
Influenza A by PCR: NEGATIVE
Influenza B by PCR: NEGATIVE
Resp Syncytial Virus by PCR: NEGATIVE
SARS Coronavirus 2 by RT PCR: NEGATIVE

## 2023-04-02 LAB — CBC WITH DIFFERENTIAL/PLATELET
Abs Immature Granulocytes: 0.04 10*3/uL (ref 0.00–0.07)
Basophils Absolute: 0.1 10*3/uL (ref 0.0–0.1)
Basophils Relative: 1 %
Eosinophils Absolute: 0.2 10*3/uL (ref 0.0–0.5)
Eosinophils Relative: 2 %
HCT: 36.2 % (ref 36.0–46.0)
Hemoglobin: 11.6 g/dL — ABNORMAL LOW (ref 12.0–15.0)
Immature Granulocytes: 1 %
Lymphocytes Relative: 19 %
Lymphs Abs: 1.4 10*3/uL (ref 0.7–4.0)
MCH: 28.6 pg (ref 26.0–34.0)
MCHC: 32 g/dL (ref 30.0–36.0)
MCV: 89.4 fL (ref 80.0–100.0)
Monocytes Absolute: 0.7 10*3/uL (ref 0.1–1.0)
Monocytes Relative: 9 %
Neutro Abs: 5.1 10*3/uL (ref 1.7–7.7)
Neutrophils Relative %: 68 %
Platelets: 273 10*3/uL (ref 150–400)
RBC: 4.05 MIL/uL (ref 3.87–5.11)
RDW: 16.6 % — ABNORMAL HIGH (ref 11.5–15.5)
WBC: 7.4 10*3/uL (ref 4.0–10.5)
nRBC: 0 % (ref 0.0–0.2)

## 2023-04-02 LAB — BASIC METABOLIC PANEL
Anion gap: 11 (ref 5–15)
BUN: 23 mg/dL (ref 8–23)
CO2: 21 mmol/L — ABNORMAL LOW (ref 22–32)
Calcium: 9.2 mg/dL (ref 8.9–10.3)
Chloride: 107 mmol/L (ref 98–111)
Creatinine, Ser: 1.43 mg/dL — ABNORMAL HIGH (ref 0.44–1.00)
GFR, Estimated: 37 mL/min — ABNORMAL LOW (ref >60)
Glucose, Bld: 162 mg/dL — ABNORMAL HIGH (ref 70–99)
Potassium: 3.3 mmol/L — ABNORMAL LOW (ref 3.5–5.1)
Sodium: 139 mmol/L (ref 135–145)

## 2023-04-02 LAB — BRAIN NATRIURETIC PEPTIDE: B Natriuretic Peptide: 445.6 pg/mL — ABNORMAL HIGH (ref 0.0–100.0)

## 2023-04-02 LAB — TROPONIN I (HIGH SENSITIVITY): Troponin I (High Sensitivity): 10 ng/L (ref <18)

## 2023-04-02 MED ORDER — FUROSEMIDE 10 MG/ML IJ SOLN
40.0000 mg | Freq: Once | INTRAMUSCULAR | Status: AC
  Administered 2023-04-02: 40 mg via INTRAVENOUS
  Filled 2023-04-02: qty 4

## 2023-04-02 MED ORDER — POTASSIUM CHLORIDE CRYS ER 20 MEQ PO TBCR
40.0000 meq | EXTENDED_RELEASE_TABLET | Freq: Once | ORAL | Status: AC
  Administered 2023-04-02: 40 meq via ORAL
  Filled 2023-04-02: qty 2

## 2023-04-02 MED ORDER — CEFDINIR 300 MG PO CAPS: 300.0000 mg | ORAL_CAPSULE | Freq: Two times a day (BID) | ORAL | 0 refills | Status: DC

## 2023-04-02 NOTE — Progress Notes (Signed)
Trauma Response Nurse Documentation   Mallory Watts is a 82 y.o. female arriving to [facility] ED via POV  On No antithrombotic. Trauma was activated as a Level 2 by ED triage RN based on the following trauma criteria Elderly patients > 65 with head trauma on anti-coagulation (excluding ASA). Trauma team at the bedside on patient arrival.   Patient cleared for CT by Dr. Blinda Leatherwood EDP. Pt transported to CT with trauma response nurse present to monitor. RN remained with the patient throughout their absence from the department for clinical observation.   GCS 15.  History   Past Medical History:  Diagnosis Date   Arthritis    Atrial fibrillation (HCC)    not symptomatic -on metoprolol BID for this    CAD (coronary artery disease)    s/p [facility] 2004 [facility]   Cancer [facility]) 1998   breast cancer--left   Dysrhythmia    atrial flutter/fib   GERD (gastroesophageal reflux disease)    Hypertension    Osteopenia    Personal history of chemotherapy 1998   Personal history of radiation therapy 1998   PONV (postoperative nausea and vomiting)    Shortness of breath    exertion   STD (sexually transmitted disease)    HSV   Urinary incontinence      Past Surgical History:  Procedure Laterality Date   ANTERIOR CERVICAL DECOMP/DISCECTOMY FUSION N/A 01/14/2014   Procedure: ANTERIOR CERVICAL DECOMPRESSION/DISCECTOMY FUSION CERVICAL FOUR-FIVE,CERVICAL FIVE SIX;  Surgeon: Barnett Abu, MD;  Location: [facility] OR[facility];  Service: Neurosurgery;  Laterality: N/A;   BREAST EXCISIONAL BIOPSY Left    BREAST LUMPECTOMY Left 1998   BREAST SURGERY  1998   left lumpectomy for breast ca   COLONOSCOPY     CORONARY ARTERY BYPASS GRAFT  2004   at [facility]   DILATION AND CURETTAGE OF UTERUS  2006   heart bypass  2003   -single   POLYPECTOMY     TONSILLECTOMY     TOTAL KNEE ARTHROPLASTY Left 11/28/2016   Procedure: LEFT TOTAL KNEE ARTHROPLASTY;  Surgeon: Ollen Gross, MD;  Location: [facility] ORS;  Service: Orthopedics;   Laterality: Left;  Canal block       Initial Focused Assessment (If applicable, or please see trauma documentation): Alert/oriented female presents via POV from home after a fall, left eye injury with bruising noted. Reports getting dizzy and falling into a chair. Denies LOC  CT's Completed:   CT Head CT max face, CT c-spine  Interventions:  IV start and trauma lab draw CT head, c-spine, max face Portable chest XRAY EKG Respiratory panel  Plan for disposition:  D/c home  Consults completed:  none  Event Summary: Presents from home after a fall with left eye injury/hematoma after a ground level fall. On eliquis. Also reporting cough.Trauma scans with large hematoma to left eye, no acute fractures. Anticipate d/c home.  MTP Summary (If applicable): NA  Bedside handoff with ED RN Thayer Ohm.    Crespin Forstrom O Fredda Clarida  Trauma Response RN  Please call TRN at [phone] for further assistance.

## 2023-04-02 NOTE — Progress Notes (Signed)
Orthopedic Tech Progress Note Patient Details:  Mallory Watts 1940-07-06 [id number]  Patient ID: Mallory Watts, female   DOB: [birth year], 82 y.o.   MRN: [id number] I attended trauma page. Trinna Post 04/02/2023, 3:49 AM

## 2023-04-02 NOTE — ED Notes (Signed)
Patient transported to CT 

## 2023-04-02 NOTE — ED Triage Notes (Signed)
Patient reports falling 2 hours ago. She was getting dressed to come to the ER to be seen for a cough. As she was putting her shoes on she became dizzy and fell from the chair to the floor. Patient hit her left eye. Left eye appears swollen shirt and bruised. Rates pain 6/10. Patient was wearing wearing  glasses when she fell.

## 2023-04-02 NOTE — Discharge Instructions (Addendum)
Take the Lasix that is prescribed to you (1-1/2 tablets) every day through Christmas (instead of every other day).  Make sure you are taking your potassium supplement.  I will give you an additional antibiotic to make sure that this is not pneumonia as well.  Please schedule follow-up with Dr. Tenny Craw and your primary care doctor.  Return for worsening breathing difficulties.

## 2023-04-02 NOTE — ED Provider Notes (Signed)
[place] EMERGENCY DEPARTMENT AT [facility] Provider Note   CSN: [id number] Arrival date & time: 04/02/23  4098     History  Chief Complaint  Patient presents with   Marletta Lor    Mallory Watts is a 82 y.o. female.  Got up to go to the bathroom and started to not feel well.  Has been sick for some time recently completed a Zithromax for cough and chest congestion.  She reports that she did take a Lunesta before going to bed but this is not her first time.  She started to feel worse, sat down on a chair and then slid off, hitting her head on the ground.  Complains of pain and swelling around the left eye.       Home Medications Prior to Admission medications   Medication Sig Start Date End Date Taking? Authorizing Provider  cefdinir (OMNICEF) 300 MG capsule Take 1 capsule (300 mg total) by mouth 2 (two) times daily. 04/02/23  Yes Weslynn Ke, Canary Brim, MD  acetaminophen (TYLENOL) 650 MG CR tablet Take 1,300 mg by mouth 2 (two) times daily as needed for pain.    [provider]  alendronate (FOSAMAX) 70 MG tablet Take 70 mg by mouth once a week. Take with a full glass of water on an empty stomach.    [provider]  apixaban (ELIQUIS) 5 MG TABS tablet Take 1 tablet (5 mg total) by mouth 2 (two) times daily. 10/18/19   Lyn Records, MD  atorvastatin (LIPITOR) 80 MG tablet TAKE 1 TABLET BY MOUTH EVERY DAY 03/09/21   Pricilla Riffle, MD  Calcium Carbonate Antacid (TUMS PO) Take 1 tablet by mouth daily.    [provider]  dronedarone (MULTAQ) 400 MG tablet TAKE 1 TABLET (400 MG TOTAL) BY MOUTH 2 (TWO) TIMES DAILY WITH A MEAL. 10/03/22   Pricilla Riffle, MD  estradiol (ESTRACE VAGINAL) 0.1 MG/GM vaginal cream Place 1 g vaginally 3 (three) times a week. 02/17/23   Chrzanowski, Clearnce Hasten B, NP  ezetimibe (ZETIA) 10 MG tablet TAKE 1 TABLET BY MOUTH EVERY DAY 02/15/22   Pricilla Riffle, MD  fexofenadine (ALLEGRA) 180 MG tablet Take 180 mg by mouth daily as needed.      [provider]  furosemide (LASIX) 40 MG tablet TAKE 1& 1/2 TABLETS BY MOUTH EVERY OTHER DAY. 08/10/22   Pricilla Riffle, MD  Magnesium 250 MG TABS Take 250 mg by mouth daily as needed (LEG CRAMPS). Reported on 05/01/2015    [provider]  meclizine (ANTIVERT) 12.5 MG tablet Take 12.5 mg by mouth 3 (three) times daily as needed for dizziness.    [provider]  metoprolol tartrate (LOPRESSOR) 25 MG tablet Take 1 tablet (25 mg total) by mouth 2 (two) times daily. 10/03/22   Pricilla Riffle, MD  mometasone (NASONEX) 50 MCG/ACT nasal spray PLACE 2 SPRAYS INTO THE NOSE DAILY. 12/27/21   Cobb, Ruby Cola, NP  nitroGLYCERIN (NITROSTAT) 0.4 MG SL tablet PLACE 1 TABLET UNDER THE TONGUE EVERY 5 MINUTES AS NEEDED FOR CHEST PAIN. 12/29/21   Pricilla Riffle, MD  omeprazole (PRILOSEC) 40 MG capsule Take 40 mg by mouth daily. 10/27/17   [provider]  potassium chloride SA (KLOR-CON M) 20 MEQ tablet Take 1 tablet (20 mEq total) by mouth 2 (two) times daily. 08/17/21   Pricilla Riffle, MD  solifenacin (VESICARE) 5 MG tablet One daily as needed 07/21/14   Verner Chol, CNM  triamterene-hydrochlorothiazide (MAXZIDE-25) 37.5-25 MG tablet Take 1 tablet by mouth daily. 09/11/16   [provider]  valACYclovir (VALTREX) 500 MG tablet Take 1 tablet (500 mg total) by mouth daily. Take one tablet by mouth twice a day for 3 days as needed for an outbreak. 01/24/18   Patton Salles, MD  zolpidem (AMBIEN) 10 MG tablet Take 10 mg by mouth at bedtime. 01/13/23   [provider]      Allergies    Atenolol, Gadolinium, and Sulfonamide derivatives    Review of Systems   Review of Systems  Physical Exam Updated Vital Signs BP (!) 144/102   Pulse 70   Temp 97.7 F (36.5 C) (Oral)   Resp (!) 24   Ht 5\' 3"  (1.6 m)   Wt 83.6 kg   LMP 04/11/1994   SpO2 91%   BMI 32.65 kg/m  Physical Exam Vitals and nursing note reviewed.  Constitutional:      General: She is  not in acute distress.    Appearance: She is well-developed.  HENT:     Head: Normocephalic. Contusion present.      Mouth/Throat:     Mouth: Mucous membranes are moist.  Eyes:     General: Vision grossly intact. Gaze aligned appropriately.     Extraocular Movements: Extraocular movements intact.     Conjunctiva/sclera: Conjunctivae normal.  Cardiovascular:     Rate and Rhythm: Normal rate and regular rhythm.     Pulses: Normal pulses.     Heart sounds: Normal heart sounds, S1 normal and S2 normal. No murmur heard.    No friction rub. No gallop.  Pulmonary:     Effort: Pulmonary effort is normal. No respiratory distress.     Breath sounds: Normal breath sounds.  Abdominal:     General: Bowel sounds are normal.     Palpations: Abdomen is soft.     Tenderness: There is no abdominal tenderness. There is no guarding or rebound.     Hernia: No hernia is present.  Musculoskeletal:        General: No swelling.     Cervical back: Full passive range of motion without pain, normal range of motion and neck supple. No spinous process tenderness or muscular tenderness. Normal range of motion.     Right lower leg: No edema.     Left lower leg: No edema.  Skin:    General: Skin is warm and dry.     Capillary Refill: Capillary refill takes less than 2 seconds.     Findings: No ecchymosis, erythema, rash or wound.  Neurological:     General: No focal deficit present.     Mental Status: She is alert and oriented to person, place, and time.     GCS: GCS eye subscore is 4. GCS verbal subscore is 5. GCS motor subscore is 6.     Cranial Nerves: Cranial nerves 2-12 are intact.     Sensory: Sensation is intact.     Motor: Motor function is intact.     Coordination: Coordination is intact.  Psychiatric:        Attention and Perception: Attention normal.        Mood and Affect: Mood normal.        Speech: Speech normal.        Behavior: Behavior normal.     ED Results / Procedures / Treatments    Labs (all labs ordered are listed, but only abnormal results are displayed) Labs  Reviewed  CBC WITH DIFFERENTIAL/PLATELET - Abnormal; Notable for the following components:      Result Value   Hemoglobin 11.6 (*)    RDW 16.6 (*)    All other components within normal limits  BASIC METABOLIC PANEL - Abnormal; Notable for the following components:   Potassium 3.3 (*)    CO2 21 (*)    Glucose, Bld 162 (*)    Creatinine, Ser 1.43 (*)    GFR, Estimated 37 (*)    All other components within normal limits  BRAIN NATRIURETIC PEPTIDE - Abnormal; Notable for the following components:   B Natriuretic Peptide 445.6 (*)    All other components within normal limits  RESP PANEL BY RT-PCR (RSV, FLU A&B, COVID)  [id number]  TROPONIN I (HIGH SENSITIVITY)    EKG EKG Interpretation Date/Time:  Sunday April 02 2023 04:02:30 EST Ventricular Rate:  69 PR Interval:    QRS Duration:  101 QT Interval:  471 QTC Calculation: 505 R Axis:   88  Text Interpretation: Atrial fibrillation Multiple ventricular premature complexes Borderline right axis deviation Borderline T wave abnormalities Prolonged QT interval No significant change since last tracing Confirmed by Gilda Crease [phone]) on 04/02/2023 4:52:56 AM  Radiology CT CERVICAL SPINE WO CONTRAST Result Date: 04/02/2023 CLINICAL DATA:  Fall EXAM: CT HEAD WITHOUT CONTRAST CT MAXILLOFACIAL WITHOUT CONTRAST CT CERVICAL SPINE WITHOUT CONTRAST TECHNIQUE: Multidetector CT imaging of the head, cervical spine, and maxillofacial structures were performed using the standard protocol without intravenous contrast. Multiplanar CT image reconstructions of the cervical spine and maxillofacial structures were also generated. RADIATION DOSE REDUCTION: This exam was performed according to the departmental dose-optimization program which includes automated exposure control, adjustment of the mA and/or kV according to patient size and/or use of iterative  reconstruction technique. COMPARISON:  None Available. FINDINGS: CT HEAD FINDINGS Brain: There is no mass, hemorrhage or extra-axial collection. There is generalized atrophy without lobar predilection. Hypodensity of the white matter is most commonly associated with chronic microvascular disease. 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 Vascular: Atherosclerotic calcification of the internal carotid arteries at the skull base. No abnormal hyperdensity of the major intracranial arteries or dural venous sinuses. Skull: Large left periorbital scalp hematoma.  No skull fracture. Sinuses/Orbits: No fluid levels or advanced mucosal thickening of the visualized paranasal sinuses. No mastoid or middle ear effusion. Ocular lens replacements. Other: None. CT MAXILLOFACIAL FINDINGS Osseous: No facial fracture or mandibular dislocation. Orbits: The globes are intact. Normal appearance of the intra- and extraconal fat. Symmetric extraocular muscles and optic nerves. Large left periorbital scalp hematoma. Sinuses: No fluid levels or advanced mucosal thickening. Soft tissues: Normal visualized extracranial soft tissues. CT CERVICAL SPINE FINDINGS Alignment: No static subluxation. Facets are aligned. Occipital condyles and the lateral masses of C1-C2 are aligned. Skull base and vertebrae: No acute fracture. C4-6 ACDF. No adverse features. Soft tissues and spinal canal: No prevertebral fluid or swelling. No visible canal hematoma. Disc levels: No advanced spinal canal or neural foraminal stenosis. Upper chest: Bilateral pleural effusions. Other: Normal visualized paraspinal cervical soft tissues. IMPRESSION: 1. No acute intracranial abnormality. 2. Large left periorbital scalp hematoma without skull fracture. 3. No acute facial fracture. 4. No acute fracture or static subluxation of the cervical spine. 5. Bilateral pleural effusions. Electronically Signed   By: Deatra Robinson M.D.   On:  04/02/2023 03:50   CT MAXILLOFACIAL WO CONTRAST Result Date: 04/02/2023 CLINICAL DATA:  Fall EXAM: CT HEAD WITHOUT  CONTRAST CT MAXILLOFACIAL WITHOUT CONTRAST CT CERVICAL SPINE WITHOUT CONTRAST TECHNIQUE: Multidetector CT imaging of the head, cervical spine, and maxillofacial structures were performed using the standard protocol without intravenous contrast. Multiplanar CT image reconstructions of the cervical spine and maxillofacial structures were also generated. RADIATION DOSE REDUCTION: This exam was performed according to the departmental dose-optimization program which includes automated exposure control, adjustment of the mA and/or kV according to patient size and/or use of iterative reconstruction technique. COMPARISON:  None Available. FINDINGS: CT HEAD FINDINGS Brain: There is no mass, hemorrhage or extra-axial collection. There is generalized atrophy without lobar predilection. Hypodensity of the white matter is most commonly associated with chronic microvascular disease. 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 Vascular: Atherosclerotic calcification of the internal carotid arteries at the skull base. No abnormal hyperdensity of the major intracranial arteries or dural venous sinuses. Skull: Large left periorbital scalp hematoma.  No skull fracture. Sinuses/Orbits: No fluid levels or advanced mucosal thickening of the visualized paranasal sinuses. No mastoid or middle ear effusion. Ocular lens replacements. Other: None. CT MAXILLOFACIAL FINDINGS Osseous: No facial fracture or mandibular dislocation. Orbits: The globes are intact. Normal appearance of the intra- and extraconal fat. Symmetric extraocular muscles and optic nerves. Large left periorbital scalp hematoma. Sinuses: No fluid levels or advanced mucosal thickening. Soft tissues: Normal visualized extracranial soft tissues. CT CERVICAL SPINE FINDINGS Alignment: No static subluxation. Facets are aligned.  Occipital condyles and the lateral masses of C1-C2 are aligned. Skull base and vertebrae: No acute fracture. C4-6 ACDF. No adverse features. Soft tissues and spinal canal: No prevertebral fluid or swelling. No visible canal hematoma. Disc levels: No advanced spinal canal or neural foraminal stenosis. Upper chest: Bilateral pleural effusions. Other: Normal visualized paraspinal cervical soft tissues. IMPRESSION: 1. No acute intracranial abnormality. 2. Large left periorbital scalp hematoma without skull fracture. 3. No acute facial fracture. 4. No acute fracture or static subluxation of the cervical spine. 5. Bilateral pleural effusions. Electronically Signed   By: Deatra Robinson M.D.   On: 04/02/2023 03:50   CT HEAD WO CONTRAST ( ) Result Date: 04/02/2023 CLINICAL DATA:  Fall EXAM: CT HEAD WITHOUT CONTRAST CT MAXILLOFACIAL WITHOUT CONTRAST CT CERVICAL SPINE WITHOUT CONTRAST TECHNIQUE: Multidetector CT imaging of the head, cervical spine, and maxillofacial structures were performed using the standard protocol without intravenous contrast. Multiplanar CT image reconstructions of the cervical spine and maxillofacial structures were also generated. RADIATION DOSE REDUCTION: This exam was performed according to the departmental dose-optimization program which includes automated exposure control, adjustment of the mA and/or kV according to patient size and/or use of iterative reconstruction technique. COMPARISON:  None Available. FINDINGS: CT HEAD FINDINGS Brain: There is no mass, hemorrhage or extra-axial collection. There is generalized atrophy without lobar predilection. Hypodensity of the white matter is most commonly associated with chronic microvascular disease. 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 Vascular: Atherosclerotic calcification of the internal carotid arteries at the skull base. No abnormal hyperdensity of the major intracranial arteries or dural venous  sinuses. Skull: Large left periorbital scalp hematoma.  No skull fracture. Sinuses/Orbits: No fluid levels or advanced mucosal thickening of the visualized paranasal sinuses. No mastoid or middle ear effusion. Ocular lens replacements. Other: None. CT MAXILLOFACIAL FINDINGS Osseous: No facial fracture or mandibular dislocation. Orbits: The globes are intact. Normal appearance of the intra- and extraconal fat. Symmetric extraocular muscles and optic nerves. Large left periorbital scalp hematoma. Sinuses: No fluid levels or  advanced mucosal thickening. Soft tissues: Normal visualized extracranial soft tissues. CT CERVICAL SPINE FINDINGS Alignment: No static subluxation. Facets are aligned. Occipital condyles and the lateral masses of C1-C2 are aligned. Skull base and vertebrae: No acute fracture. C4-6 ACDF. No adverse features. Soft tissues and spinal canal: No prevertebral fluid or swelling. No visible canal hematoma. Disc levels: No advanced spinal canal or neural foraminal stenosis. Upper chest: Bilateral pleural effusions. Other: Normal visualized paraspinal cervical soft tissues. IMPRESSION: 1. No acute intracranial abnormality. 2. Large left periorbital scalp hematoma without skull fracture. 3. No acute facial fracture. 4. No acute fracture or static subluxation of the cervical spine. 5. Bilateral pleural effusions. Electronically Signed   By: Deatra Robinson M.D.   On: 04/02/2023 03:50   DG Chest Port 1 View Result Date: 04/02/2023 CLINICAL DATA: Cough, fall EXAM: PORTABLE CHEST 1 VIEW COMPARISON:  06/16/2021 FINDINGS: Prior CABG. Heart is borderline in size. Mediastinal contours are within normal limits. Bilateral lower lobe airspace opacities. No visible effusions or acute bony abnormality. IMPRESSION: Bilateral lower lobe airspace opacities could reflect edema or infection. Electronically Signed   By: Charlett Nose M.D.   On: 04/02/2023 03:07    Procedures Procedures    Medications Ordered in  ED Medications  furosemide (LASIX) injection 40 mg (40 mg Intravenous Given 04/02/23 0521)  potassium chloride SA (KLOR-CON M) CR tablet 40 mEq (40 mEq Oral Given 04/02/23 0518)    ED Course/ Medical Decision Making/ A&P                                 Medical Decision Making Amount and/or Complexity of Data Reviewed Labs: ordered. Radiology: ordered.  Risk Prescription drug management.   Presents to the emergency department for evaluation after a fall.  Patient reports that she felt very weak tonight, was sitting in a chair and slid off, hitting the left side of her face.  She does take Eliquis secondary to history of atrial fibrillation.  Patient with large contusion around her left eye.  Eyelid was carefully opened and there is no hyphema or signs of orbital injury.  Normal ocular muscle function noted.  CT of her head, neck and face were performed.  No injuries other than soft tissue injury noted externally.  Patient reports that she was on her way to the ED tonight when she fell.  She has not been feeling well for some time.  She was placed on a Z-Pak for her shortness of breath recently but did not improve.  Reviewing records reveals a history of congestive heart failure with preserved ejection fraction (ejection fraction seen on echo in April 2023 was 60 to 65% with no wall motion abnormalities).  Chest x-ray shows bibasilar opacities that could be consistent with congestive heart failure.  A BNP was sent and it is elevated.  Suspect that her symptoms are secondary to mild exacerbation of congestive heart failure.  She is not hypoxic here.  Patient does take Lasix daily.  Will give IV diuresis here, increase her diuretics for the next few days.  I cannot rule out pneumonia, will give more broad-spectrum antibiotic coverage as well.  She is not requiring oxygen, appears well, will be appropriate for discharge and prompt outpatient follow-up with cardiology and primary care.  Given  return precautions.        Final Clinical Impression(s) / ED Diagnoses Final diagnoses:  Contusion of face, initial encounter  Acute  on chronic congestive heart failure, unspecified heart failure type [facility])    Rx / DC Orders ED Discharge Orders          Ordered    cefdinir (OMNICEF) 300 MG capsule  2 times daily        04/02/23 0510              Gilda Crease, MD 04/02/23 [phone]

## 2023-04-09 ENCOUNTER — Other Ambulatory Visit: Payer: Self-pay

## 2023-04-09 ENCOUNTER — Emergency Department (HOSPITAL_COMMUNITY): Payer: Medicare Other

## 2023-04-09 ENCOUNTER — Emergency Department (HOSPITAL_COMMUNITY)
Admission: EM | Admit: 2023-04-09 | Discharge: 2023-04-09 | Disposition: A | Discharge status: Home / Self Care | Payer: Medicare Other | Attending: Emergency Medicine | Admitting: Emergency Medicine

## 2023-04-09 DIAGNOSIS — W01198D Fall on same level from slipping, tripping and stumbling with subsequent striking against other object, subsequent encounter: Secondary | ICD-10-CM | POA: Diagnosis not present

## 2023-04-09 DIAGNOSIS — I10 Essential (primary) hypertension: Secondary | ICD-10-CM | POA: Diagnosis not present

## 2023-04-09 DIAGNOSIS — I2510 Atherosclerotic heart disease of native coronary artery without angina pectoris: Secondary | ICD-10-CM | POA: Insufficient documentation

## 2023-04-09 DIAGNOSIS — Z7901 Long term (current) use of anticoagulants: Secondary | ICD-10-CM | POA: Insufficient documentation

## 2023-04-09 DIAGNOSIS — Z853 Personal history of malignant neoplasm of breast: Secondary | ICD-10-CM | POA: Diagnosis not present

## 2023-04-09 DIAGNOSIS — S0083XD Contusion of other part of head, subsequent encounter: Secondary | ICD-10-CM | POA: Diagnosis not present

## 2023-04-09 DIAGNOSIS — R930 Abnormal findings on diagnostic imaging of skull and head, not elsewhere classified: Secondary | ICD-10-CM | POA: Insufficient documentation

## 2023-04-09 DIAGNOSIS — W19XXXD Unspecified fall, subsequent encounter: Secondary | ICD-10-CM

## 2023-04-09 DIAGNOSIS — Z87891 Personal history of nicotine dependence: Secondary | ICD-10-CM | POA: Insufficient documentation

## 2023-04-09 DIAGNOSIS — S0012XD Contusion of left eyelid and periocular area, subsequent encounter: Secondary | ICD-10-CM | POA: Insufficient documentation

## 2023-04-09 DIAGNOSIS — I480 Paroxysmal atrial fibrillation: Secondary | ICD-10-CM | POA: Diagnosis not present

## 2023-04-09 DIAGNOSIS — Z96652 Presence of left artificial knee joint: Secondary | ICD-10-CM | POA: Insufficient documentation

## 2023-04-09 DIAGNOSIS — S1093XD Contusion of unspecified part of neck, subsequent encounter: Secondary | ICD-10-CM | POA: Insufficient documentation

## 2023-04-09 DIAGNOSIS — Z951 Presence of aortocoronary bypass graft: Secondary | ICD-10-CM | POA: Insufficient documentation

## 2023-04-09 DIAGNOSIS — S0993XD Unspecified injury of face, subsequent encounter: Secondary | ICD-10-CM | POA: Diagnosis present

## 2023-04-09 LAB — I-STAT CHEM 8, ED
BUN: 24 mg/dL — ABNORMAL HIGH (ref 8–23)
Calcium, Ion: 1.12 mmol/L — ABNORMAL LOW (ref 1.15–1.40)
Chloride: 108 mmol/L (ref 98–111)
Creatinine, Ser: 1.1 mg/dL — ABNORMAL HIGH (ref 0.44–1.00)
Glucose, Bld: 108 mg/dL — ABNORMAL HIGH (ref 70–99)
HCT: 35 % — ABNORMAL LOW (ref 36.0–46.0)
Hemoglobin: 11.9 g/dL — ABNORMAL LOW (ref 12.0–15.0)
Potassium: 3.4 mmol/L — ABNORMAL LOW (ref 3.5–5.1)
Sodium: 142 mmol/L (ref 135–145)
TCO2: 23 mmol/L (ref 22–32)

## 2023-04-09 MED ORDER — LIDOCAINE HCL (PF) 1 % IJ SOLN: 10.0000 mL | Freq: Once | INTRAMUSCULAR | Status: DC

## 2023-04-09 MED ORDER — FLUCONAZOLE 150 MG PO TABS: 150.0000 mg | ORAL_TABLET | Freq: Every day | ORAL | 0 refills | Status: AC

## 2023-04-09 MED ORDER — OXYCODONE-ACETAMINOPHEN 5-325 MG PO TABS
1.0000 | ORAL_TABLET | Freq: Once | ORAL | Status: AC
  Administered 2023-04-09: 1 via ORAL
  Filled 2023-04-09: qty 1

## 2023-04-09 MED ORDER — LIDOCAINE HCL 2 % IJ SOLN
10.0000 mL | Freq: Once | INTRAMUSCULAR | Status: AC
  Administered 2023-04-09: 200 mg
  Filled 2023-04-09: qty 20

## 2023-04-09 NOTE — ED Provider Triage Note (Cosign Needed Addendum)
Emergency Medicine Provider Triage Evaluation Note  Mallory Watts , a 82 y.o. female  was evaluated in triage.  Pt complains of fall that occurred on the 22nd of December. Went to her Dr after being in the ER and he was concerned the area was not going down more. Reports blurring of vision of both eyes. Reports intermittent headaches since falling. No new neck pain. Is on Eliquis.  Review of Systems  Positive: Contusion to L face Negative: Vomiting  Physical Exam  BP 135/74 (BP Location: Right Arm)   Pulse 75   Temp 98 F (36.7 C)   Resp 17   LMP 04/11/1994   SpO2 92%  Gen:   Awake, no distress   Resp:  Normal effort  MSK:   Moves extremities without difficulty  Other:  +large hematoma to L eye, no neurodeficits  Medical Decision Making  Medically screening exam initiated at 11:33 AM.  Appropriate orders placed.  Mallory Watts was informed that the remainder of the evaluation will be completed by another provider, this initial triage assessment does not replace that evaluation, and the importance of remaining in the ED until their evaluation is complete.     Mallory Pelt, PA 04/09/23 1136    Mallory Watts, [place] 04/09/23 1136

## 2023-04-09 NOTE — Discharge Instructions (Addendum)
You have been seen here in the emergency department for fall. . We have obtained a full history, performed a physical exam, in addition to other diagnostic tests and treatments. Right now, we feel that you are safe for discharge from a medical perspective, and do not have an acute life threatening illness.   To do: 1.) Take all medications as prescribed.   2.) If anything changes, or you develop fevers, chills, inability to eat or drink, severe pain, new symptoms, return of symptoms, worsening of symptoms, or any other concerns, please call 911 or come back to the emergency department as soon as possible.   3.) Please make an appointment with your primary care doctor for a follow-up visit after being seen here in the emergency department.  Thank you for allowing me to take care of you today. We hope that you feel better soon.

## 2023-04-09 NOTE — ED Triage Notes (Signed)
Pt. Stated, I fell on December 22  and hit my face.I came here and they xrayed my face , but I'm still having a bad discoloration and headaches.. My Dr. Catalina Pizza me to come back here.

## 2023-04-09 NOTE — ED Provider Notes (Addendum)
[place] EMERGENCY DEPARTMENT AT [facility] Provider Note  HPI   Mallory Watts is a 82 y.o. female patient with a PMHx of Eliquis use, coronary artery disease paroxysmal A-fib hyperlipidemia who is here today for face pain  Patient was seen here on 04/02/2023, which was about 7 days ago week ago, and had a fall, hit her head on the ground, she had multiple CT scans at the time that were negative including a head neck and face.  She was discharged home and also started on cefdinir for possible pneumonia  This patient is back here today due to continued face pain and requesting repeat evaluation  ROS Negative except as per HPI   Medical Decision Making   Upon presentation, the patient is afebrile hemodynamic stable she is neurovascular intact, please refer to pictures, she has significant bruising over her face and anterior neck, and has a significant hematoma over the left eyebrow.  She is tender in this area.  An i-STAT was obtained, hemoglobin 11.9 potassium 3.4 creatinine 1.10, and repeat CT head and CT face were obtained prior to me all seeing the patient, and they show continued hematoma but no acute fractures which is the same as last time  We contemplated draining a hematoma, patient is on Eliquis.  Right now, her patient's pain is very mild, and right now we do not feel it is clinically necessary, she can follow-up with her primary care to get this done in an outpatient nonemergent setting, if she chooses to.   Will take Tylenol for pain in the outpatient setting  Will give a prescription for Diflucan for patient's reported yeast infection.   At this time, I feel that the patient is medically cleared for discharge and have discussed this with my attending who agrees.  I discussed with the patient and/or family my overall assessment, including my physical exam, labs, imaging, other diagnostic tests, and therapeutics given.  All questions answered and understanding is  expressed.  I have instructed to call PCP to establish an outpatient appointment after this ED visit, and necessary specialty follow up if needed. I gave strict return precautions to come back to the ED including fevers, chills, severe pain, worsening of symptoms, return of symptoms, new and concerning symptoms, inability to tolerate p.o. intake, among others. I specifically stated to return if symptoms worsen return  1. Fall, subsequent encounter     @DISPOSITION @  Rx / DC Orders ED Discharge Orders     None        Past Medical History:  Diagnosis Date   Arthritis    Atrial fibrillation (HCC)    not symptomatic -on metoprolol BID for this    CAD (coronary artery disease)    s/p CAGB 2004 at [facility]   Cancer [facility]) 1998   breast cancer--left   Dysrhythmia    atrial flutter/fib   GERD (gastroesophageal reflux disease)    Hypertension    Osteopenia    Personal history of chemotherapy 1998   Personal history of radiation therapy 1998   PONV (postoperative nausea and vomiting)    Shortness of breath    exertion   STD (sexually transmitted disease)    HSV   Urinary incontinence    Past Surgical History:  Procedure Laterality Date   ANTERIOR CERVICAL DECOMP/DISCECTOMY FUSION N/A 01/14/2014   Procedure: ANTERIOR CERVICAL DECOMPRESSION/DISCECTOMY FUSION CERVICAL FOUR-FIVE,CERVICAL FIVE SIX;  Surgeon: Barnett Abu, MD;  Location: [facility];  Service: Neurosurgery;  Laterality: N/A;  BREAST EXCISIONAL BIOPSY Left    BREAST LUMPECTOMY Left 1998   BREAST SURGERY  1998   left lumpectomy for breast ca   COLONOSCOPY     CORONARY ARTERY BYPASS GRAFT  2004   at [facility]   DILATION AND CURETTAGE OF UTERUS  2006   heart bypass  2003   -single   POLYPECTOMY     TONSILLECTOMY     TOTAL KNEE ARTHROPLASTY Left 11/28/2016   Procedure: LEFT TOTAL KNEE ARTHROPLASTY;  Surgeon: Ollen Gross, MD;  Location: [facility] OR[facility];  Service: Orthopedics;  Laterality: Left;  Canal block   Family History   Problem Relation Age of Onset   Diabetes Mother        AODM   Stroke Mother    Diabetes Brother    Stroke Father    Colon cancer Neg Hx    Colon polyps Neg Hx    Esophageal cancer Neg Hx    Rectal cancer Neg Hx    Stomach cancer Neg Hx    Social History   Socioeconomic History   Marital status: Media planner    Spouse name: Not on file   Number of children: Not on file   Years of education: Not on file   Highest education level: Not on file  Occupational History   Not on file  Tobacco Use   Smoking status: Former   Smokeless tobacco: Never   Tobacco comments:    smoked in college   Vaping Use   Vaping status: Never Used  Substance and Sexual Activity   Alcohol use: Yes    Alcohol/week: 1.0 standard drink of alcohol    Types: 1 Glasses of wine per week    Comment: occasionally   Drug use: No   Sexual activity: Not Currently    Partners: Male    Birth control/protection: Post-menopausal  Other Topics Concern   Not on file  Social History Narrative   Not on file   Social Drivers of Health   Financial Resource Strain: Not on file  Food Insecurity: Not on file  Transportation Needs: Not on file  Physical Activity: Not on file  Stress: Not on file  Social Connections: Unknown (08/23/2021)   Received from [facility]   Social Network    Social Network: Not on file  Intimate Partner Violence: Unknown (07/15/2021)   Received from [facility], [facility]   HITS    Physically Hurt: Not on file    Insult or Talk Down To: Not on file    Threaten Physical Harm: Not on file    Scream or Curse: Not on file     Physical Exam   Vitals:   04/09/23 1109 04/09/23 1433  BP: 135/74 (!) 147/68  Pulse: 75 75  Resp: 17 17  Temp: 98 F (36.7 C) 98.1 F (36.7 C)  SpO2: 92% 98%    Physical Exam Vitals and nursing note reviewed.  Constitutional:      General: She is not in acute distress.    Appearance: She is well-developed.  HENT:     Head:  Normocephalic and atraumatic.  Eyes:     Conjunctiva/sclera: Conjunctivae normal.  Cardiovascular:     Rate and Rhythm: Normal rate and regular rhythm.     Heart sounds: No murmur heard. Pulmonary:     Effort: Pulmonary effort is normal. No respiratory distress.     Breath sounds: Normal breath sounds.  Abdominal:     Palpations: Abdomen is soft.  Tenderness: There is no abdominal tenderness.  Musculoskeletal:        General: No swelling.     Cervical back: Neck supple.  Skin:    General: Skin is warm and dry.     Capillary Refill: Capillary refill takes less than 2 seconds.  Neurological:     General: No focal deficit present.     Mental Status: She is alert and oriented to person, place, and time.     Comments: Moving all 4s, sensation intact  Psychiatric:        Mood and Affect: Mood normal.         Procedures   If procedures were preformed on this patient, they are listed below:  Procedures  The patient was seen, evaluated, and treated in conjunction with the attending physician, who voiced agreement in the care provided.  Note generated using Dragon voice dictation software and may contain dictation errors. Please contact me for any clarification or with any questions.   Electronically signed by:  Osvaldo Shipper, M.D. (PGY-2)    Gunnar Bulla, MD 04/09/23 1731    Gunnar Bulla, MD 04/09/23 1743    Charlynne Pander, MD 04/09/23 2235

## 2023-04-20 NOTE — Progress Notes (Addendum)
Cardiology Office Note:  .   Date:  04/27/2023  ID:  Mallory Watts, DOB 1940-05-31, MRN [id number] PCP: Creola Corn, MD  [facility] Providers Cardiologist:  Dietrich Pates, MD    Patient Profile: .      PMH Coronary artery disease S/p CABG in 2004 Stress myoview 2023 low risk Atrial fibrillation Central sleep apnea Hypertension Hyperlipidemia  She wore a 14-day monitor in April 2023 which showed atrial fibrillation 100% of the time.  HR ranges 44 to 187 bpm with average HR 79 bpm.  She was seen by Dr. Tenny Craw and placed on Multaq.  She reported feeling better and EKG at next visit showed sinus rhythm.  She was seen in pulmonary clinic due to sleep study that showed severe sleep apnea.  She was set up for BiPAP.  She also had PFTs which showed moderate decreased DLCO.  Her metoprolol was decreased and her breathing got better.  Echocardiogram 07/23/2021 showed normal systolic function, unable to evaluate diastolic function, mild to moderate aortic valve insufficiency, and mild to moderate TR.  Last cardiology clinic visit was 07/04/2022 with Dr. Tenny Craw.  She reported she had felt more unsteady since having her left knee replaced.  She reported increased DOE.  Clinically appeared to be in sinus rhythm.  She was compliant with BiPAP for sleep apnea.  3-day ZIO monitor was ordered and revealed sinus rhythm with average HR 67 bpm, HR range 45 to 109 bpm, rare PVC, PAC, triggered events corresponded to SR, and SR with PVC.  Cardiac PET/CT was ordered and completed 10/18/2022 to rule out worsening ischemia.  She had no evidence of ischemia or scar on CT.  She contacted our office 03/22/2023 to report shortness of breath for 1 week when moving around.  She was also feeling fatigued and lightheaded.  She was also having a productive cough and was taking Robitussin DM.  Was seen by pulmonologist the day prior and was told to contact them if no improvement in symptoms.       History of Present  Illness: .   Mallory Watts is a very pleasant 83 y.o. female who is here today for follow-up of CAD. She is having a persistent cough and increased shortness of breath. She reports that the cough is constant since December and unresponsive to cough suppressants.  She has been able to use her BiPAP machine due to the coughing and is subsequently not sleeping well. Seen by PCP recently with negative COVID test and normal CXR. She reports a fall in December from a chair, which resulted in significant bruising. Speculates that the fall may have been due to an episode of vertigo, for which she sometimes takes meclizine. She  reports increased shortness of breath, even with minimal exertion such as walking from the front door to the car. She is having bilateral LE edema and feels like she has gained weight but has she has not been monitoring weight consistently. Reports she is fairly active around the house and is up and down stairs often due to living in a 3 story home. Occasionally sees "rust spots" in her urine but denies that urine is dark in color. She continues to take Eliquis with no additional evidence of bleeding. She denies chest pain, palpitations, orthopnea, PND, presyncope, syncope. She asks if we can help get her in with her pulmonologist sooner than March.   Discussed the use of AI scribe software for clinical note transcription with the patient, who gave verbal  consent to proceed.   ROS: See HPI       Studies Reviewed: .        Risk Assessment/Calculations:    CHA2DS2-VASc Score = 5   This indicates a 7.2% annual risk of stroke. The patient's score is based upon: CHF History: 0 HTN History: 1 Diabetes History: 0 Stroke History: 0 Vascular Disease History: 1 Age Score: 2 Gender Score: 1            Physical Exam:   VS:  BP 130/70 (BP Location: Right Arm, Patient Position: Sitting, Cuff Size: Normal)   Pulse 82   Ht 5\' 2"  (1.575 m)   Wt 182 lb (82.6 kg)   LMP 04/11/1994    BMI 33.29 kg/m    Wt Readings from Last 3 Encounters:  04/27/23 182 lb (82.6 kg)  04/02/23 184 lb 4.9 oz (83.6 kg)  03/21/23 184 lb 6.4 oz (83.6 kg)    GEN: Well nourished, well developed in no acute distress NECK: No JVD; No carotid bruits CARDIAC: RRR, soft murmur. No rubs, gallops RESPIRATORY:  Clear to auscultation without rales, wheezing or rhonchi  ABDOMEN: Soft, non-tender, non-distended EXTREMITIES:  No edema; No deformity     ASSESSMENT AND PLAN: .    Shortness of breath: Increased since last visit in March. Reports increased shortness of breath with minimal walking. Frequently goes up and down stairs in her home and denies significant dyspnea. Possible multifactorial etiology including cardiac and pulmonary causes. Has had a chronic cough since December which is interfering with her BiPAP.  She is not sleeping well.  History of mild to moderate aortic valve insufficiency and mild to moderate TR on echo 07/2021, EF was normal. Bilateral LE edema. We will update echocardiogram to assess valve function and heart function. She has been taking furosemide 40 mg every other day. Advised her to take furosemide 40 mg daily x 3 days. We will call her in 5 days to assess her response. Advised patient to seek earlier appointment with pulmonologist and I will forward my note to him as well.   Valve disease: Mild to moderate aortic valve insufficiency and mild to moderate TR on echo 07/2021.  Soft murmur noted on exam.  We will get updated echocardiogram to assess valve function.  Leg edema: Bilateral LE edema with erythema noted.  She has been alternating furosemide 40 mg every other day with triamterene/hydrochlorothiazide.  Advised her to take furosemide 40 mg daily x 3 days to see if leg edema improves.  Advised leg compression and elevation and demonstrated appropriate leg elevation. If edema improves with furosemide, will consider continuing daily furosemide.   CAD: History of CABG 2004.  Cardiac PET CT 10/2022 with no evidence of ischemia or scar.  She is not having chest pain.  As noted above, is having some SOB.  No indication for further ischemia evaluation at this time.  We will continue GDMT including atorvastatin, ezetimibe, metoprolol, Maxide. She is not on aspirin in the setting of OAC for PAF.   PAF on chronic anticoagulation: No episodes of tachypalpitations.  Clinically she appears to be in sinus rhythm today.  HR is well controlled today. No bleeding concerns. Continue Eliquis 5 mg twice daily which is appropriate dose for stroke prevention for CHA2DS2-VASc score of 5.  She is on Multaq for rhythm control. Continue metoprolol for rate control.  Hypertension: BP is well controlled. She denies episodes of hypotension that may have contributed to fall.   Hyperlipidemia LDL goal <  55: Lipid panel completed 01/08/23 reveals total cholesterol 120, HDL 46, LDL 59, and triglycerides 73. LDL is close to goal. We will continue atorvastatin and ezetimibe.        Disposition: 2-3 months with Dr. Tenny Craw or APP  Signed, Eligha Bridegroom, NP-C

## 2023-04-27 ENCOUNTER — Ambulatory Visit: Payer: Medicare Other | Attending: Nurse Practitioner | Admitting: Nurse Practitioner

## 2023-04-27 ENCOUNTER — Encounter: Payer: Self-pay | Admitting: Nurse Practitioner

## 2023-04-27 VITALS — BP 130/70 | HR 82 | Ht 62.0 in | Wt 182.0 lb

## 2023-04-27 DIAGNOSIS — I340 Nonrheumatic mitral (valve) insufficiency: Secondary | ICD-10-CM | POA: Diagnosis not present

## 2023-04-27 DIAGNOSIS — I351 Nonrheumatic aortic (valve) insufficiency: Secondary | ICD-10-CM

## 2023-04-27 DIAGNOSIS — I2510 Atherosclerotic heart disease of native coronary artery without angina pectoris: Secondary | ICD-10-CM

## 2023-04-27 DIAGNOSIS — D6859 Other primary thrombophilia: Secondary | ICD-10-CM

## 2023-04-27 DIAGNOSIS — R600 Localized edema: Secondary | ICD-10-CM

## 2023-04-27 DIAGNOSIS — Z951 Presence of aortocoronary bypass graft: Secondary | ICD-10-CM

## 2023-04-27 DIAGNOSIS — I4892 Unspecified atrial flutter: Secondary | ICD-10-CM

## 2023-04-27 DIAGNOSIS — R0602 Shortness of breath: Secondary | ICD-10-CM | POA: Diagnosis not present

## 2023-04-27 MED ORDER — FUROSEMIDE 40 MG PO TABS: 40.0000 mg | ORAL_TABLET | ORAL | Status: DC

## 2023-04-27 NOTE — Patient Instructions (Signed)
Medication Instructions:   CHANGE Lasix one (1) tablet by mouth ( 40 mg) X 3 days than hold on Sunday.  I will call you Monday.  CONTINUE To take one (1) tablet by mouth ( 20 mEq) twice daily.   *If you need a refill on your cardiac medications before your next appointment, please call your pharmacy*   Lab Work:  None ordered.  If you have labs (blood work) drawn today and your tests are completely normal, you will receive your results only by: MyChart Message (if you have MyChart) OR A paper copy in the mail If you have any lab test that is abnormal or we need to change your treatment, we will call you to review the results.   Testing/Procedures:  Your physician has requested that you have an echocardiogram. Echocardiography is a painless test that uses sound waves to create images of your heart. It provides your doctor with information about the size and shape of your heart and how well your heart's chambers and valves are working. This procedure takes approximately one hour. There are no restrictions for this procedure. Please do NOT wear cologne, perfume or lotions (deodorant is allowed). Please arrive 15 minutes prior to your appointment time.  Please note: We ask at that you not bring children with you during ultrasound (echo/ vascular) testing. Due to room size and safety concerns, children are not allowed in the ultrasound rooms during exams. Our front office staff cannot provide observation of children in our lobby area while testing is being conducted. An adult accompanying a patient to their appointment will only be allowed in the ultrasound room at the discretion of the ultrasound technician under special circumstances. We apologize for any inconvenience.    Follow-Up: At [facility], you and your health needs are our priority.  As part of our continuing mission to provide you with exceptional heart care, we have created designated Provider Care Teams.  These Care  Teams include your primary Cardiologist (physician) and Advanced Practice Providers (APPs -  Physician Assistants and Nurse Practitioners) who all work together to provide you with the care you need, when you need it.  We recommend signing up for the patient portal called "MyChart".  Sign up information is provided on this After Visit Summary.  MyChart is used to connect with patients for Virtual Visits (Telemedicine).  Patients are able to view lab/test results, encounter notes, upcoming appointments, etc.  Non-urgent messages can be sent to your provider as well.   To learn more about what you can do with MyChart, go to [email or web address].    Your next appointment:   3 month(s)  Provider:   Dietrich Pates, MD     Other Instructions   1st Floor: - Lobby - Registration  - Pharmacy  - Lab - Cafe  2nd Floor: - PV Lab - Diagnostic Testing (echo, CT, nuclear med)  3rd Floor: - Vacant  4th Floor: - TCTS (cardiothoracic surgery) - AFib Clinic - Structural Heart Clinic - Vascular Surgery  - Vascular Ultrasound  5th Floor: - [facility]Care Cardiology (general and EP) - Clinical Pharmacy for coumadin, hypertension, lipid, weight-loss medications, and med management appointments    Valet parking services will be available as well.

## 2023-05-03 ENCOUNTER — Telehealth (HOSPITAL_BASED_OUTPATIENT_CLINIC_OR_DEPARTMENT_OTHER): Payer: Self-pay | Admitting: *Deleted

## 2023-05-03 NOTE — Telephone Encounter (Signed)
-----   Message from [facility] Lesieli Bresee G sent at 04/27/2023  9:32 AM EST ----- Call pt Monday to see how pt is doing taking lasix 40 mg x 3 days

## 2023-05-03 NOTE — Telephone Encounter (Signed)
Lvm to check on pt whom took Lasix X 3 days.

## 2023-05-04 NOTE — Telephone Encounter (Signed)
S/w pt to check on pt about SOB and increased lasix, how pt was feeling. Pt was playing cards and will have to call back.

## 2023-05-04 NOTE — Telephone Encounter (Signed)
-----   Message from [facility] Lesieli Bresee G sent at 04/27/2023  9:32 AM EST ----- Call pt Monday to see how pt is doing taking lasix 40 mg x 3 days

## 2023-05-05 NOTE — Telephone Encounter (Signed)
Patient was returning call. Please advise ?

## 2023-05-05 NOTE — Telephone Encounter (Signed)
Left voicemail to return call to office

## 2023-05-08 DIAGNOSIS — I255 Ischemic cardiomyopathy: Secondary | ICD-10-CM

## 2023-05-08 DIAGNOSIS — I10 Essential (primary) hypertension: Secondary | ICD-10-CM

## 2023-05-08 MED ORDER — FUROSEMIDE 40 MG PO TABS: 40.0000 mg | ORAL_TABLET | Freq: Every day | ORAL | 3 refills | Status: DC

## 2023-05-08 MED ORDER — POTASSIUM CHLORIDE CRYS ER 20 MEQ PO TBCR: 20.0000 meq | EXTENDED_RELEASE_TABLET | Freq: Two times a day (BID) | ORAL | 3 refills | Status: DC

## 2023-05-08 NOTE — Telephone Encounter (Signed)
Pt called in stating the lasix is working well and she asked if she should continue on the medication. Pt states she is not having any symptoms.   She is having a hard time answering her phone, she is going to [employer] today. She asked if you could communicate via mychart instead.

## 2023-05-08 NOTE — Telephone Encounter (Signed)
Would recommend that she take Lasix 40 mg daily in addition to K-dur 20 mEq twice daily. We will send a new Rx to her pharmacy. I recommend that she discontinue Maxzide (triamterene-hydrochlorothiazide) since we are increasing the Lasix and she is feeling well taking it. We need to get BMET in 7-10 days.

## 2023-05-14 NOTE — Progress Notes (Deleted)
 @Patient  ID: Mallory Watts, female    DOB: 1940/07/21, 83 y.o.   MRN: [id number]  No chief complaint on file.   Referring provider: Onita Rush, MD  HPI: 83 year old female, former smoker.  Past medical history significant for OSA on BiPAP.  Former patient of Dr. Shellia, last seen in September 2023.  05/16/2023 Discussed the use of AI scribe software for clinical note transcription with the patient, who gave verbal consent to proceed.  History of Present Illness   The patient, previously diagnosed with sleep apnea and managed with BiPAP, presents with concerns about her BiPAP machine and a new onset cough. She reports that her BiPAP machine seems to shut off during the night, causing her to feel like she is breathing her own breath back in. This issue started approximately three weeks ago. The patient also mentions that she has not received new supplies for her BiPAP machine as expected. She uses a full face mask, which she changes monthly, and she believes the mask size and fit are appropriate.  In addition to the BiPAP issues, the patient reports a new onset cough that started as an irritated throat and has now progressed to involve the chest. This cough started within the last two weeks, but the patient notes a similar episode last month that resolved with prednisone . The patient describes the current cough as similar to previous experiences with bronchitis.  The patient also mentions a change in sleep medication from Lunesta to Ambien , which she reports has improved her sleep quality. She does not know the exact dosage of the Ambien , but she reports less grogginess the next day compared to when she was on Lunesta.  Airview download 02/19/2023 - 03/20/2023 Usage days 29/30 days (97%) Average usage days used 5 5 minutes IPAP 17 cm H2O/EPAP 13 cm H2O Air leaks 28.2 L/min (95%) AHI 22.8    Sleep testing PSG 10/11/18 >> AHI 58.7, SpO2 low 80% HST 08/16/21 >> AHI 47.4, SpO2 low 72% Bipap  titration 10/18/21 >> Bipap 19/15 cm H2O >> AHI 3.3, +R, +S. Bipap 11/20/21 to 12/19/21 >> used on 25 of 30 nights with average 5 hrs 27 min.  Average AHI 24.6 with Bipap 19/15 cm H2O.  Air leak noted  Allergies  Allergen Reactions   Atenolol Other (See Comments)   Gadolinium Nausea Only and Other (See Comments)     Desc: PATIENT FELT DIZZY AND NAUSEOUS AFTER GAD INJECTION. NO VOMITING,NO HIVES, NO RASH., Onset Date: [id number]    Sulfonamide Derivatives     Other Reaction(s): hives    Immunization History  Administered Date(s) Administered   Influenza Split 01/11/2010, 02/12/2010, 03/09/2011, 01/20/2012, 03/01/2013, 03/17/2014   Influenza, High Dose Seasonal PF 03/17/2014   Influenza, Quadrivalent, Recombinant, Inj, Pf 12/04/2017, 11/28/2018   Influenza,inj,Quad PF,6+ Mos 03/29/2016   Influenza,inj,quad, With Preservative 12/11/2018   Influenza-Unspecified 03/02/2017, 01/23/2018, 11/28/2018   PFIZER(Purple Top)SARS-COV-2 Vaccination 05/01/2019, 05/22/2019, 06/12/2020   Pneumococcal Conjugate-13 10/04/2013   Zoster Recombinant(Shingrix) 06/12/2020, 11/10/2020   Zoster, Live 09/25/2007    Past Medical History:  Diagnosis Date   Arthritis    Atrial fibrillation (HCC)    not symptomatic -on metoprolol  BID for this    CAD (coronary artery disease)    s/p CAGB 2004 at [facility]   Cancer [facility]) 1998   breast cancer--left   Dysrhythmia    atrial flutter/fib   GERD (gastroesophageal reflux disease)    Hypertension    Osteopenia    Personal history of chemotherapy 1998  Personal history of radiation therapy 1998   PONV (postoperative nausea and vomiting)    Shortness of breath    exertion   STD (sexually transmitted disease)    HSV   Urinary incontinence     Tobacco History: Social History   Tobacco Use  Smoking Status Former  Smokeless Tobacco Never  Tobacco Comments   smoked in college    Counseling given: Not Answered Tobacco comments: smoked in college    Outpatient  Medications Prior to Visit  Medication Sig Dispense Refill   acetaminophen  (TYLENOL ) 650 MG CR tablet Take 1,300 mg by mouth 2 (two) times daily as needed for pain.     alendronate  (FOSAMAX ) 70 MG tablet Take 70 mg by mouth once a week. Take with a full glass of water  on an empty stomach.     apixaban  (ELIQUIS ) 5 MG TABS tablet Take 1 tablet (5 mg total) by mouth 2 (two) times daily. 60 tablet 5   atorvastatin  (LIPITOR) 80 MG tablet TAKE 1 TABLET BY MOUTH EVERY DAY 90 tablet 3   Calcium  Carbonate Antacid (TUMS PO) Take 1 tablet by mouth daily.     cefdinir  (OMNICEF ) 300 MG capsule Take 1 capsule (300 mg total) by mouth 2 (two) times daily. 20 capsule 0   dronedarone  (MULTAQ ) 400 MG tablet TAKE 1 TABLET (400 MG TOTAL) BY MOUTH 2 (TWO) TIMES DAILY WITH A MEAL. 180 tablet 2   estradiol  (ESTRACE  VAGINAL) 0.1 MG/GM vaginal cream Place 1 g vaginally 3 (three) times a week. 42.5 g 12   ezetimibe  (ZETIA ) 10 MG tablet TAKE 1 TABLET BY MOUTH EVERY DAY 90 tablet 3   fexofenadine (ALLEGRA) 180 MG tablet Take 180 mg by mouth daily as needed.      furosemide  (LASIX ) 40 MG tablet Take 1 tablet (40 mg total) by mouth daily. 90 tablet 3   Magnesium  250 MG TABS Take 250 mg by mouth daily as needed (LEG CRAMPS). Reported on 05/01/2015     meclizine (ANTIVERT) 12.5 MG tablet Take 12.5 mg by mouth 3 (three) times daily as needed for dizziness.     metoprolol  tartrate (LOPRESSOR ) 25 MG tablet Take 1 tablet (25 mg total) by mouth 2 (two) times daily. 180 tablet 2   mometasone  (NASONEX ) 50 MCG/ACT nasal spray PLACE 2 SPRAYS INTO THE NOSE DAILY. 51 each 1   nitroGLYCERIN  (NITROSTAT ) 0.4 MG SL tablet PLACE 1 TABLET UNDER THE TONGUE EVERY 5 MINUTES AS NEEDED FOR CHEST PAIN. 25 tablet 2   omeprazole  (PRILOSEC) 40 MG capsule Take 40 mg by mouth daily.  3   potassium chloride  SA (KLOR-CON  M) 20 MEQ tablet Take 1 tablet (20 mEq total) by mouth 2 (two) times daily. 180 tablet 3   solifenacin  (VESICARE ) 5 MG tablet One daily as  needed 90 tablet 3   valACYclovir  (VALTREX ) 500 MG tablet Take 1 tablet (500 mg total) by mouth daily. Take one tablet by mouth twice a day for 3 days as needed for an outbreak. 120 tablet 3   zolpidem  (AMBIEN ) 10 MG tablet Take 10 mg by mouth at bedtime as needed for sleep.     No facility-administered medications prior to visit.    Review of Systems  Review of Systems  Constitutional: Negative.   HENT: Negative.  Negative for postnasal drip.   Respiratory:  Positive for cough. Negative for chest tightness, shortness of breath and wheezing.   Cardiovascular: Negative.     Physical Exam  LMP 04/11/1994  Physical Exam  Constitutional:      Appearance: Normal appearance.  HENT:     Head: Normocephalic and atraumatic.  Cardiovascular:     Rate and Rhythm: Normal rate and regular rhythm.  Pulmonary:     Effort: Pulmonary effort is normal.     Breath sounds: Normal breath sounds. No wheezing or rhonchi.  Neurological:     General: No focal deficit present.     Mental Status: She is alert and oriented to person, place, and time. Mental status is at baseline.  Psychiatric:        Mood and Affect: Mood normal.        Behavior: Behavior normal.        Thought Content: Thought content normal.        Judgment: Judgment normal.      Lab Results:  CBC    Component Value Date/Time   WBC 7.4 04/02/2023 0255   RBC 4.05 04/02/2023 0255   HGB 11.9 (L) 04/09/2023 1204   HGB 14.5 10/19/2018 0924   HGB 13.0 08/10/2010 1412   HCT 35.0 (L) 04/09/2023 1204   HCT 44.1 10/19/2018 0924   HCT 38.3 08/10/2010 1412   PLT 273 04/02/2023 0255   PLT 240 10/19/2018 0924   MCV 89.4 04/02/2023 0255   MCV 88 10/19/2018 0924   MCV 94.8 08/10/2010 1412   MCH 28.6 04/02/2023 0255   MCHC 32.0 04/02/2023 0255   RDW 16.6 (H) 04/02/2023 0255   RDW 14.3 10/19/2018 0924   RDW 13.8 08/10/2010 1412   LYMPHSABS 1.4 04/02/2023 0255   LYMPHSABS 1.9 08/10/2010 1412   MONOABS 0.7 04/02/2023 0255    MONOABS 0.4 08/10/2010 1412   EOSABS 0.2 04/02/2023 0255   EOSABS 0.1 08/10/2010 1412   BASOSABS 0.1 04/02/2023 0255   BASOSABS 0.0 08/10/2010 1412    BMET    Component Value Date/Time   NA 142 04/09/2023 1204   NA 140 08/31/2021 1216   K 3.4 (L) 04/09/2023 1204   CL 108 04/09/2023 1204   CO2 21 (L) 04/02/2023 0255   GLUCOSE 108 (H) 04/09/2023 1204   BUN 24 (H) 04/09/2023 1204   BUN 18 08/31/2021 1216   CREATININE 1.10 (H) 04/09/2023 1204   CALCIUM  9.2 04/02/2023 0255   GFRNONAA 37 (L) 04/02/2023 0255   GFRAA 48 (L) 10/19/2018 0924    BNP    Component Value Date/Time   BNP 445.6 (H) 04/02/2023 0255    ProBNP    Component Value Date/Time   PROBNP 1,438 (H) 08/13/2021 1022    Imaging: No results found.   Assessment & Plan:   There are no diagnoses linked to this encounter.     Obstructive Sleep Apnea Patient reports BiPAP machine may be shutting off during the night. Patient is compliant with BiPAP use but is experiencing residual apneic events on current pressure setting 17/13cm h20. Previous pressure adjustment did not improve apnea score. -Contact [facility] to service BiPAP machine. -If no improvement, needs repeat sleep lab titration study. -Renew CPAP supplies with DME  Acute Cough New onset cough with phlegm production 2 weeks, similar to previous episode that resolved with prednisone . No associated fever, shortness of breath, chest tightenss, PND or reflux symptoms. Lungs clear on exam. No significant findings on pulmonary function tests except decreased diffusion defect which normalized when correlated for lung volumes. -Trial of Robitussin DM for symptomatic relief. Notify office if no improvement, may consider as needed ICS/SABA  BiPAP Supplies Patient reports not receiving new supplies since November. -  Renew BiPAP supplies, specifically the hose.  Insomnia Patient recently switched from Lunesta to Ambien  10mg  for sleep, which has improved sleep quality   -Continue Ambien  10mg  as needed for sleep.      Reggy Salt, MD 05/14/2023

## 2023-05-14 NOTE — Progress Notes (Signed)
 57 yoF previously seen by Dr Shellia and Hope, NP for OSA on BIPAP  Obstructive Sleep Apnea Patient reports BiPAP machine may be shutting off during the night. Patient is compliant with BiPAP use but is experiencing residual apneic events on current pressure setting 17/13cm h20. Previous pressure adjustment did not improve apnea score. -Contact [facility] to service BiPAP machine. -If no improvement, needs repeat sleep lab titration study. -Renew CPAP supplies with DME   Acute Cough New onset cough with phlegm production 2 weeks, similar to previous episode that resolved with prednisone . No associated fever, shortness of breath, chest tightenss, PND or reflux symptoms. Lungs clear on exam. No significant findings on pulmonary function tests except decreased diffusion defect which normalized when correlated for lung volumes. -Trial of Robitussin DM for symptomatic relief. Notify office if no improvement, may consider as needed ICS/SABA   BiPAP Supplies Patient reports not receiving new supplies since November. -Renew BiPAP supplies, specifically the hose.   Insomnia Patient recently switched from Lunesta to Ambien  10mg  for sleep, which has improved sleep quality  -Continue Ambien  10 ----------------------------------------------------------------- 05/16/23- 82 yoF former smoker with hx OSA on BIPAP, Insomnia, Acute Bronchitis, complicated by pAFib/ Eliquis , mild to moderate AVR/TR,  HTN, CAD, CHF, GERD/stricture, hx L Br Ca,  -Ambien  10, Nasonex ,  BIPAP 17/13/ [facility]    AirCurve10 VAuto Download compliance-    up til Dec 15, AHI 22.5-                                              may need to change to autoBIPAP (EPAP 10-20, PS 4) Body weight today-177 lbs Previously followed by [facility] Pending ECHO per cardiology. Recent Cardiac PET w/o ischemia or scar. Tried increasing lasix  for peripheral edema. -----Has not worn Bipap since November, she had coughing and sneezing and could not wear mask.  She  has been sick. She has been on antibiotics/prednisone  and it is still not resolved.  Cough is just getting a little better. CXR 04/02/23 1V- Bilateral lower lobe airspace opacities could reflect edema or infection.  Discussed the use of AI scribe software for clinical note transcription with the patient, who gave verbal consent to proceed.  History of Present Illness   The patient, with a history of atrial fibrillation and vertigo, presents with a persistent cough and sneezing since Thanksgiving. She denies fever and reports occasional production of phlegm, which has been clear for the past couple of weeks. She had been on Omnicef , an antibiotic, which did not seem to help. She also reports swelling in her feet (x 6 months), which improved with daily Lasix . She denies any change in her chest discomfort with Lasix . She has not been using her BiPAP machine due to the cough and sneezing, and also due to a fall in December which resulted in a facial injury. She reports extreme fatigue, which she attributes to not using the BiPAP machine. She also reports increased swelling in her feet over the past six months.  She has been working with cardiology and f/u ECHO is pending. BIPAP machine had begun cutting on and off during the night- needs service.     ROS-see HPI   + = positive Constitutional:    weight loss, night sweats, fevers, chills, +fatigue, lassitude. HEENT:    headaches, difficulty swallowing, tooth/dental problems, sore throat,       sneezing, itching,  ear ache, nasal congestion, post nasal drip, snoring CV:    chest pain, orthopnea, PND, swelling in lower extremities, anasarca,                                   dizziness, palpitations Resp:   shortness of breath with exertion or at rest.                productive cough,   non-productive cough, coughing up of blood.              change in color of mucus.  wheezing.   Skin:    rash or lesions. GI:  No-   heartburn, indigestion, abdominal  pain, nausea, vomiting, diarrhea,                 change in bowel habits, loss of appetite GU: dysuria, change in color of urine, no urgency or frequency.   flank pain. MS:   joint pain, stiffness, decreased range of motion, back pain. Neuro-     nothing unusual Psych:  change in mood or affect.  depression or anxiety.   memory loss.  OBJ- Physical Exam General- Alert, Oriented, Affect-appropriate, Distress- none acute Skin- rash-none, lesions- none, excoriation- none Lymphadenopathy- none Head- +fading hematoma L brow ridge            Eyes- Gross vision intact, PERRLA, conjunctivae and secretions clear            Ears- Hearing, canals-normal            Nose- Clear, no-Septal dev, mucus, polyps, erosion, perforation             Throat- Mallampati II , mucosa clear , drainage- none, tonsils- atrophic Neck- flexible , trachea midline, no stridor , thyroid  nl, carotid no bruit Chest - symmetrical excursion , unlabored           Heart/CV- RRR , no murmur , no gallop  , no rub, nl s1 s2                           - JVD- none , edema+3 tight, stasis changes- none, varices- none           Lung- +diminished, wheeze- none, cough+ dry with deep breath , dullness-none, rub- none           Chest wall-  Abd-  Br/ Gen/ Rectal- Not done, not indicated Extrem- cyanosis- none, clubbing, none, atrophy- none, strength- nl Neuro- grossly intact to observation  Assessment and Plan- I doubt infection now. Cough is mild, but will see what Trelegy sample does for it. Will service BIPAP, then consider on f/u whether to change to [facility].    Chronic Cough Persistent cough since Thanksgiving, initially with some infected-looking sputum, now clear. No fever. Recent antibiotic (Omnicef ) did not improve symptoms. No signs of infection on recent blood work. Increased lasix  did not much change either leg edema or cough. -Order two-view chest x-ray to evaluate for possible pneumonia or pulmonary edema. -Check CBC to  evaluate for any changes in white blood cell count. -Trial of Trelegy inhaler to assess for any improvement in cough.  Lower Extremity Edema Increased swelling in feet over the past six months, improved with daily Lasix . High BNP a month ago. -Notify cardiologist of increased edema. -Continue daily Lasix  as prescribed by cardiologist. Gracia BNP  Sleep Apnea Patient  has not been using BiPAP due to cough and recent facial injury. Reports good sleep when using the device. -Service BiPAP machine. -Encourage patient to resume use of BiPAP as soon as possible.  Atrial Fibrillation Chronic condition, patient on Eliquis . -Continue Eliquis  as prescribed.  Facial Injury Recent fall resulting in facial injury, possibly due to vertigo. -No specific plan discussed, continue to monitor.  General Health Maintenance -Continue with planned echocardiogram. -Check basic chemistry as planned by Dr. Okey.

## 2023-05-15 ENCOUNTER — Other Ambulatory Visit: Payer: Self-pay | Admitting: Internal Medicine

## 2023-05-15 ENCOUNTER — Telehealth: Payer: Self-pay | Admitting: Internal Medicine

## 2023-05-15 NOTE — Telephone Encounter (Signed)
Pt scheduled for appt with Dr Tarri Glenn 05/16/23- needs to bring SD card from CPAP if she is using her machine. Called the pt and there was no answer- I left a detailed msg on her machine asking her to bring SD card ok per DPR.

## 2023-05-16 ENCOUNTER — Ambulatory Visit (INDEPENDENT_AMBULATORY_CARE_PROVIDER_SITE_OTHER): Payer: Medicare Other

## 2023-05-16 ENCOUNTER — Ambulatory Visit: Payer: Medicare Other | Admitting: Internal Medicine

## 2023-05-16 ENCOUNTER — Encounter: Payer: Self-pay | Admitting: Internal Medicine

## 2023-05-16 VITALS — BP 120/70 | HR 90 | Temp 98.2°F | Ht 62.0 in | Wt 177.6 lb

## 2023-05-16 DIAGNOSIS — I10 Essential (primary) hypertension: Secondary | ICD-10-CM

## 2023-05-16 DIAGNOSIS — I4891 Unspecified atrial fibrillation: Secondary | ICD-10-CM | POA: Diagnosis not present

## 2023-05-16 DIAGNOSIS — G4733 Obstructive sleep apnea (adult) (pediatric): Secondary | ICD-10-CM | POA: Diagnosis not present

## 2023-05-16 DIAGNOSIS — R0609 Other forms of dyspnea: Secondary | ICD-10-CM

## 2023-05-16 DIAGNOSIS — I255 Ischemic cardiomyopathy: Secondary | ICD-10-CM

## 2023-05-16 DIAGNOSIS — R059 Cough, unspecified: Secondary | ICD-10-CM | POA: Diagnosis not present

## 2023-05-16 DIAGNOSIS — R2243 Localized swelling, mass and lump, lower limb, bilateral: Secondary | ICD-10-CM | POA: Diagnosis not present

## 2023-05-16 LAB — CBC WITH DIFFERENTIAL/PLATELET
Basophils Absolute: 0 10*3/uL (ref 0.0–0.1)
Basophils Relative: 0.5 % (ref 0.0–3.0)
Eosinophils Absolute: 0.1 10*3/uL (ref 0.0–0.7)
Eosinophils Relative: 1.4 % (ref 0.0–5.0)
HCT: 36.8 % (ref 36.0–46.0)
Hemoglobin: 11.8 g/dL — ABNORMAL LOW (ref 12.0–15.0)
Lymphocytes Relative: 19.4 % (ref 12.0–46.0)
Lymphs Abs: 1.3 10*3/uL (ref 0.7–4.0)
MCHC: 32.1 g/dL (ref 30.0–36.0)
MCV: 86.5 fL (ref 78.0–100.0)
Monocytes Absolute: 0.7 10*3/uL (ref 0.1–1.0)
Monocytes Relative: 10.6 % (ref 3.0–12.0)
Neutro Abs: 4.6 10*3/uL (ref 1.4–7.7)
Neutrophils Relative %: 68.1 % (ref 43.0–77.0)
Platelets: 280 10*3/uL (ref 150.0–400.0)
RBC: 4.26 Mil/uL (ref 3.87–5.11)
RDW: 17.2 % — ABNORMAL HIGH (ref 11.5–15.5)
WBC: 6.8 10*3/uL (ref 4.0–10.5)

## 2023-05-16 LAB — BRAIN NATRIURETIC PEPTIDE: Pro B Natriuretic peptide (BNP): 361 pg/mL — ABNORMAL HIGH (ref 0.0–100.0)

## 2023-05-16 MED ORDER — TRELEGY ELLIPTA 100-62.5-25 MCG/ACT IN AEPB: 1.0000 | INHALATION_SPRAY | Freq: Every day | RESPIRATORY_TRACT | Status: DC

## 2023-05-16 NOTE — Patient Instructions (Signed)
 Order- DME Kimber- please service BIPAP machine- cutting off, continue present settings  Order- CXR  dx Dyspnea, CHF  Order lab- CBC w diff, B naturetic peptide  Try to get back to using your BIPAP regularly  Order- sample  Trelegy 100    inhale 1 puff, then rinse mouth, once daily

## 2023-05-17 ENCOUNTER — Emergency Department (HOSPITAL_COMMUNITY): Payer: Medicare Other

## 2023-05-17 ENCOUNTER — Other Ambulatory Visit: Payer: Self-pay

## 2023-05-17 ENCOUNTER — Ambulatory Visit: Payer: Medicare Other | Admitting: Internal Medicine

## 2023-05-17 ENCOUNTER — Encounter (HOSPITAL_COMMUNITY): Payer: Self-pay

## 2023-05-17 ENCOUNTER — Emergency Department (HOSPITAL_COMMUNITY)
Admission: EM | Admit: 2023-05-17 | Discharge: 2023-05-17 | Disposition: A | Discharge status: Home / Self Care | Payer: Medicare Other | Attending: Emergency Medicine | Admitting: Emergency Medicine

## 2023-05-17 DIAGNOSIS — I4891 Unspecified atrial fibrillation: Secondary | ICD-10-CM | POA: Insufficient documentation

## 2023-05-17 DIAGNOSIS — J90 Pleural effusion, not elsewhere classified: Secondary | ICD-10-CM

## 2023-05-17 DIAGNOSIS — Z20822 Contact with and (suspected) exposure to covid-19: Secondary | ICD-10-CM | POA: Insufficient documentation

## 2023-05-17 DIAGNOSIS — S0083XA Contusion of other part of head, initial encounter: Secondary | ICD-10-CM

## 2023-05-17 DIAGNOSIS — W07XXXA Fall from chair, initial encounter: Secondary | ICD-10-CM | POA: Insufficient documentation

## 2023-05-17 DIAGNOSIS — S0990XA Unspecified injury of head, initial encounter: Secondary | ICD-10-CM | POA: Diagnosis present

## 2023-05-17 DIAGNOSIS — W19XXXA Unspecified fall, initial encounter: Secondary | ICD-10-CM

## 2023-05-17 DIAGNOSIS — Z7901 Long term (current) use of anticoagulants: Secondary | ICD-10-CM | POA: Insufficient documentation

## 2023-05-17 DIAGNOSIS — R509 Fever, unspecified: Secondary | ICD-10-CM | POA: Insufficient documentation

## 2023-05-17 DIAGNOSIS — Z79899 Other long term (current) drug therapy: Secondary | ICD-10-CM | POA: Diagnosis not present

## 2023-05-17 DIAGNOSIS — M25512 Pain in left shoulder: Secondary | ICD-10-CM | POA: Diagnosis not present

## 2023-05-17 DIAGNOSIS — I509 Heart failure, unspecified: Secondary | ICD-10-CM | POA: Insufficient documentation

## 2023-05-17 DIAGNOSIS — R059 Cough, unspecified: Secondary | ICD-10-CM | POA: Insufficient documentation

## 2023-05-17 LAB — COMPREHENSIVE METABOLIC PANEL
ALT: 17 U/L (ref 0–44)
AST: 19 U/L (ref 15–41)
Albumin: 3.2 g/dL — ABNORMAL LOW (ref 3.5–5.0)
Alkaline Phosphatase: 59 U/L (ref 38–126)
Anion gap: 14 (ref 5–15)
BUN: 24 mg/dL — ABNORMAL HIGH (ref 8–23)
CO2: 24 mmol/L (ref 22–32)
Calcium: 8.9 mg/dL (ref 8.9–10.3)
Chloride: 106 mmol/L (ref 98–111)
Creatinine, Ser: 1.26 mg/dL — ABNORMAL HIGH (ref 0.44–1.00)
GFR, Estimated: 43 mL/min — ABNORMAL LOW (ref >60)
Glucose, Bld: 134 mg/dL — ABNORMAL HIGH (ref 70–99)
Potassium: 3.6 mmol/L (ref 3.5–5.1)
Sodium: 144 mmol/L (ref 135–145)
Total Bilirubin: 1.1 mg/dL (ref 0.0–1.2)
Total Protein: 6.4 g/dL — ABNORMAL LOW (ref 6.5–8.1)

## 2023-05-17 LAB — CBC
HCT: 35.8 % — ABNORMAL LOW (ref 36.0–46.0)
Hemoglobin: 11 g/dL — ABNORMAL LOW (ref 12.0–15.0)
MCH: 27 pg (ref 26.0–34.0)
MCHC: 30.7 g/dL (ref 30.0–36.0)
MCV: 88 fL (ref 80.0–100.0)
Platelets: 256 10*3/uL (ref 150–400)
RBC: 4.07 MIL/uL (ref 3.87–5.11)
RDW: 16.4 % — ABNORMAL HIGH (ref 11.5–15.5)
WBC: 7.2 10*3/uL (ref 4.0–10.5)
nRBC: 0 % (ref 0.0–0.2)

## 2023-05-17 LAB — I-STAT CHEM 8, ED
BUN: 24 mg/dL — ABNORMAL HIGH (ref 8–23)
Calcium, Ion: 1.12 mmol/L — ABNORMAL LOW (ref 1.15–1.40)
Chloride: 107 mmol/L (ref 98–111)
Creatinine, Ser: 1.3 mg/dL — ABNORMAL HIGH (ref 0.44–1.00)
Glucose, Bld: 128 mg/dL — ABNORMAL HIGH (ref 70–99)
HCT: 34 % — ABNORMAL LOW (ref 36.0–46.0)
Hemoglobin: 11.6 g/dL — ABNORMAL LOW (ref 12.0–15.0)
Potassium: 3.6 mmol/L (ref 3.5–5.1)
Sodium: 144 mmol/L (ref 135–145)
TCO2: 25 mmol/L (ref 22–32)

## 2023-05-17 LAB — SAMPLE TO BLOOD BANK

## 2023-05-17 LAB — URINALYSIS, ROUTINE W REFLEX MICROSCOPIC
Bilirubin Urine: NEGATIVE
Glucose, UA: NEGATIVE mg/dL
Hgb urine dipstick: NEGATIVE
Ketones, ur: NEGATIVE mg/dL
Leukocytes,Ua: NEGATIVE
Nitrite: NEGATIVE
Protein, ur: 30 mg/dL — AB
Specific Gravity, Urine: 1.024 (ref 1.005–1.030)
pH: 6 (ref 5.0–8.0)

## 2023-05-17 LAB — RESP PANEL BY RT-PCR (RSV, FLU A&B, COVID)  RVPGX2
Influenza A by PCR: NEGATIVE
Influenza B by PCR: NEGATIVE
Resp Syncytial Virus by PCR: NEGATIVE
SARS Coronavirus 2 by RT PCR: NEGATIVE

## 2023-05-17 LAB — PROTIME-INR
INR: 1.7 — ABNORMAL HIGH (ref 0.8–1.2)
Prothrombin Time: 20 s — ABNORMAL HIGH (ref 11.4–15.2)

## 2023-05-17 LAB — ETHANOL: Alcohol, Ethyl (B): <10 mg/dL (ref <10)

## 2023-05-17 LAB — I-STAT CG4 LACTIC ACID, ED: Lactic Acid, Venous: 0.7 mmol/L (ref 0.5–1.9)

## 2023-05-17 MED ORDER — ACETAMINOPHEN 325 MG PO TABS
650.0000 mg | ORAL_TABLET | Freq: Once | ORAL | Status: AC
  Administered 2023-05-17: 650 mg via ORAL
  Filled 2023-05-17: qty 2

## 2023-05-17 NOTE — Discharge Instructions (Addendum)
 You were seen in the emergency department after your fall.  You had no broken bones or internal bleeding on your imaging.  You do have a large bruise to your forehead and to your left shoulder.  You had no signs of urinary tract infection or other infection.  You can take Tylenol  every 6 hours as needed for pain and should ice your forehead and shoulder.  You should follow-up with your primary doctor in the next few days to have your symptoms rechecked.  You should return to the emergency department for significantly worsening headache, repetitive vomiting, numbness or weakness on one side of body compared to the other or any other new or concerning symptoms.

## 2023-05-17 NOTE — ED Provider Notes (Signed)
 [place] [facility]ERGENCY DEPARTMENT AT [facility] Provider Note   CSN: [id number] Arrival date & time: 05/17/23  0701     History  Chief Complaint  Patient presents with   level 2 trauma FOT    Mallory Watts is a 83 y.o. female.  Patient is an 83 year old female with a past medical history of A-fib on Eliquis , CHF, OSA that presented to the emergency department after a fall.  Patient reports that she was sitting in a chair trying to scoop her cats litter box this morning when the chair slipped out from under her and she fell hitting the left side of her head.  She hit her medical alert button as she was unable to get herself up.  She states she does not think that she lost consciousness.  She states that she is having a headache as well as some left-sided shoulder and knee pain.  She denies any nausea, vomiting, vision changes, numbness or weakness.  Of note the patient also reports that she has had an increased cough over the last 2 weeks.  She was seen by pulmonology yesterday and had labs and chest x-ray done but has not yet received her results.  She states that she was started on inhaler and cough medicine that she has not yet tried yet.  The history is provided by the patient.       Home Medications Prior to Admission medications   Medication Sig Start Date End Date Taking? Authorizing Provider  acetaminophen  (TYLENOL ) 650 MG CR tablet Take 1,300 mg by mouth 2 (two) times daily as needed for pain.    [provider]  alendronate  (FOSAMAX ) 70 MG tablet Take 70 mg by mouth once a week. Take with a full glass of water  on an empty stomach.    [provider]  apixaban  (ELIQUIS ) 5 MG TABS tablet Take 1 tablet (5 mg total) by mouth 2 (two) times daily. 10/18/19   Claudene Victory ORN, MD  atorvastatin  (LIPITOR) 80 MG tablet TAKE 1 TABLET BY MOUTH EVERY DAY 03/09/21   Okey Vina GAILS, MD  Calcium  Carbonate Antacid (TUMS PO) Take 1 tablet by mouth daily.    [provider]  dronedarone  (MULTAQ ) 400 MG tablet TAKE 1 TABLET (400 MG TOTAL) BY MOUTH 2 (TWO) TIMES DAILY WITH A MEAL. 10/03/22   Okey Vina GAILS, MD  estradiol  (ESTRACE  VAGINAL) 0.1 MG/GM vaginal cream Place 1 g vaginally 3 (three) times a week. 02/17/23   Chrzanowski, Jami B, NP  ezetimibe  (ZETIA ) 10 MG tablet TAKE 1 TABLET BY MOUTH EVERY DAY 02/15/22   Ross, Paula V, MD  fexofenadine (ALLEGRA) 180 MG tablet Take 180 mg by mouth daily as needed.     [provider]  Fluticasone -Umeclidin-Vilant (TRELEGY ELLIPTA ) 100-62.5-25 MCG/ACT AEPB Inhale 1 puff into the lungs daily. 05/16/23   Neysa Reggy BIRCH, MD  furosemide  (LASIX ) 40 MG tablet Take 1 tablet (40 mg total) by mouth daily. 05/08/23   Swinyer, Rosaline HERO, NP  Magnesium  250 MG TABS Take 250 mg by mouth daily as needed (LEG CRAMPS). Reported on 05/01/2015    [provider]  meclizine (ANTIVERT) 12.5 MG tablet Take 12.5 mg by mouth 3 (three) times daily as needed for dizziness.    [provider]  metoprolol  tartrate (LOPRESSOR ) 25 MG tablet Take 1 tablet (25 mg total) by mouth 2 (two) times daily. 10/03/22   Okey Vina GAILS, MD  mometasone  (NASONEX ) 50 MCG/ACT nasal spray PLACE 2  SPRAYS INTO THE NOSE DAILY. 12/27/21   Cobb, Comer GAILS, NP  nitroGLYCERIN  (NITROSTAT ) 0.4 MG SL tablet PLACE 1 TABLET UNDER THE TONGUE EVERY 5 MINUTES AS NEEDED FOR CHEST PAIN. 12/29/21   Okey Vina GAILS, MD  omeprazole  (PRILOSEC) 40 MG capsule Take 40 mg by mouth daily. 10/27/17   [provider]  potassium chloride  SA (KLOR-CON  M) 20 MEQ tablet Take 1 tablet (20 mEq total) by mouth 2 (two) times daily. 05/08/23   Swinyer, Rosaline HERO, NP  solifenacin  (VESICARE ) 5 MG tablet One daily as needed 07/21/14   Rodgers Barnie RAMAN, CNM  triamterene -hydrochlorothiazide  (DYAZIDE ) 37.5-25 MG capsule Take 1 capsule by mouth daily.    [provider]  valACYclovir  (VALTREX ) 500 MG tablet Take 1 tablet (500 mg total) by mouth daily. Take one tablet by  mouth twice a day for 3 days as needed for an outbreak. 01/24/18   Cathlyn JAYSON Nikki Bobie FORBES, MD  zolpidem  (AMBIEN ) 10 MG tablet Take 10 mg by mouth at bedtime as needed for sleep. 01/13/23   [provider]      Allergies    Atenolol, Gadolinium, and Sulfonamide derivatives    Review of Systems   Review of Systems  Physical Exam Updated Vital Signs BP (!) 153/80   Pulse 79   Temp 100.2 F (37.9 C) (Oral)   Resp 15   Ht 5' 2 (1.575 m)   Wt 85.3 kg   LMP 04/11/1994   SpO2 97%   BMI 34.39 kg/m  Physical Exam Vitals and nursing note reviewed.  Constitutional:      General: She is not in acute distress.    Appearance: Normal appearance.  HENT:     Head: Normocephalic.     Comments: L forehead hematoma    Nose: Nose normal.     Mouth/Throat:     Mouth: Mucous membranes are moist.     Pharynx: Oropharynx is clear.  Eyes:     Extraocular Movements: Extraocular movements intact.     Conjunctiva/sclera: Conjunctivae normal.     Pupils: Pupils are equal, round, and reactive to light.  Neck:     Comments: Midline lower c-spine tenderness, c-collar placed Cardiovascular:     Rate and Rhythm: Normal rate and regular rhythm.     Heart sounds: Normal heart sounds.  Pulmonary:     Effort: Pulmonary effort is normal.     Breath sounds: Normal breath sounds.  Abdominal:     General: Abdomen is flat.     Palpations: Abdomen is soft.     Tenderness: There is no abdominal tenderness.  Musculoskeletal:        General: Normal range of motion.     Right lower leg: No edema.     Left lower leg: No edema.     Comments: No midline back tenderness Tenderness to palpation of left shoulder with overlying contusion, no tenderness to right upper extremity Tenderness to palpation of left anterior knee, no tenderness to right lower extremity Pelvis stable, nontender  Skin:    General: Skin is warm and dry.  Neurological:     General: No focal deficit present.     Mental Status:  She is alert and oriented to person, place, and time.     Sensory: No sensory deficit.     Motor: No weakness.  Psychiatric:        Mood and Affect: Mood normal.        Behavior: Behavior normal.  ED Results / Procedures / Treatments   Labs (all labs ordered are listed, but only abnormal results are displayed) Labs Reviewed  COMPREHENSIVE METABOLIC PANEL - Abnormal; Notable for the following components:      Result Value   Glucose, Bld 134 (*)    BUN 24 (*)    Creatinine, Ser 1.26 (*)    Total Protein 6.4 (*)    Albumin 3.2 (*)    GFR, Estimated 43 (*)    All other components within normal limits  CBC - Abnormal; Notable for the following components:   Hemoglobin 11.0 (*)    HCT 35.8 (*)    RDW 16.4 (*)    All other components within normal limits  URINALYSIS, ROUTINE W REFLEX MICROSCOPIC - Abnormal; Notable for the following components:   Protein, ur 30 (*)    Bacteria, UA RARE (*)    All other components within normal limits  PROTIME-INR - Abnormal; Notable for the following components:   Prothrombin Time 20.0 (*)    INR 1.7 (*)    All other components within normal limits  I-STAT CHEM 8, ED - Abnormal; Notable for the following components:   BUN 24 (*)    Creatinine, Ser 1.30 (*)    Glucose, Bld 128 (*)    Calcium , Ion 1.12 (*)    Hemoglobin 11.6 (*)    HCT 34.0 (*)    All other components within normal limits  RESP PANEL BY RT-PCR (RSV, FLU A&B, COVID)  RVPGX2  ETHANOL  I-STAT CG4 LACTIC ACID, ED  SAMPLE TO BLOOD BANK    EKG None  Radiology CT Shoulder Left Wo Contrast Result Date: 05/17/2023 CLINICAL DATA:  Shoulder trauma. Pain after fall. Evaluate for fracture of humerus or scapula. On prior radiographs curvilinear density posterior humeral head was seen on transscapular Y-view, possibly calcific tendinosis, subacromial/subdeltoid bursal or joint capsule calcification, for cortical fracture from the humeral head. EXAM: CT OF THE UPPER LEFT EXTREMITY  WITHOUT CONTRAST TECHNIQUE: Multidetector CT imaging of the upper left extremity was performed according to the standard protocol. RADIATION DOSE REDUCTION: This exam was performed according to the departmental dose-optimization program which includes automated exposure control, adjustment of the mA and/or kV according to patient size and/or use of iterative reconstruction technique. COMPARISON:  Left shoulder radiographs 05/17/2023; chest radiographs 05/17/2023, 05/16/2023, 04/02/2023 FINDINGS: Bones/Joint/Cartilage Mild glenohumeral joint space narrowing. Mild peripheral glenoid degenerative osteophytosis. No acute fracture is seen within the visualized proximal left humerus or the scapula. The acromioclavicular joint is appropriately aligned with mild joint space narrowing and peripheral spurring. The questioned curvilinear density posterior to the humeral head on transscapular Y-view only of today's radiographs actually is favored to represent the normal posterior cortex of the posterior scapular spine partially overlapping the posterior aspect of the humeral head on the transscapular Y-view. The scapular spine is visualized on the current CT series 4, images 30 through 47 with a similar course as the questioned density. No acute fracture is seen within the visualized superior ribs. Ligaments Suboptimally assessed by CT. Muscles and Tendons The Hg of the imaging limits evaluation of the rotator cuff musculature. No gross tendon tear is seen. Soft tissues There are dense coronary artery and aortic arch atherosclerotic calcifications. Left axillary surgical clips.  Left breast surgical clips Moderate left lung partially visualized pleural effusion. Ground-glass opacification and mild focal heterogeneous opacification within the superolateral left lung (axial series 3, image 75) mild visualized superior left lung interstitial thickening. IMPRESSION: 1. No acute fracture  is seen within the visualized proximal left  humerus or the scapula. The questioned curvilinear density posterior to the humeral head on transscapular Y-view only of today's radiographs is favored to represent the normal posterior cortex of the posterior scapular spine partially overlapping the posterior aspect of the humeral head on that transscapular Y-view. 2. Mild glenohumeral and acromioclavicular osteoarthritis. 3. Moderate left lung partially visualized pleural effusion. Ground-glass opacification and mild focal heterogeneous opacification within the superolateral left lung. This may represent atelectasis or pneumonia. Aortic Atherosclerosis (ICD10-I70.0). Electronically Signed   By: Tanda Lyons M.D.   On: 05/17/2023 12:49   DG Knee Left Port Result Date: 05/17/2023 CLINICAL DATA:  Fall with knee pain. EXAM: PORTABLE LEFT KNEE - 1-2 VIEW COMPARISON:  None Available. FINDINGS: Status post tricompartmental knee replacement. No evidence for acute fracture or hardware complication. No joint effusion. Electronically Signed   By: Camellia Candle M.D.   On: 05/17/2023 09:03   DG Shoulder Left Port Result Date: 05/17/2023 CLINICAL DATA:  Left shoulder pain after a fall. EXAM: LEFT SHOULDER COMPARISON:  None Available. FINDINGS: No evidence for shoulder separation or dislocation. Curvilinear density is identified posterior to the humeral head on the scapular Y-view, potentially reflecting calcific tendinosis although cortical fracture from the humeral head cannot be excluded. Surgical clips are noted in the left axillary region. IMPRESSION: Curvilinear density posterior to the humeral head on the scapular Y-view, potentially reflecting calcific tendinosis although cortical fracture from the humeral head cannot be excluded. CT could be used to further evaluate. Electronically Signed   By: Camellia Candle M.D.   On: 05/17/2023 08:59   DG Pelvis Portable Result Date: 05/17/2023 CLINICAL DATA:  83 year old female status post fall down stairs on Eliquis . EXAM:  PORTABLE PELVIS 1-2 VIEWS COMPARISON:  None. FINDINGS: Portable AP supine view at 0712 hours. Bone mineralization is within normal limits for age. Femoral heads appear normally located. No pelvis fracture identified. SI joints appear symmetric. Grossly intact proximal femurs. Negative visible bowel gas. Pelvic vascular calcifications. IMPRESSION: No acute fracture or dislocation identified about the pelvis. Electronically Signed   By: Mallory Watts M.D.   On: 05/17/2023 08:03   DG Chest Port 1 View Result Date: 05/17/2023 CLINICAL DATA:  83 year old female status post fall down stairs on Eliquis . EXAM: PORTABLE CHEST 1 VIEW COMPARISON:  PA and lateral chest radiographs yesterday, earlier. FINDINGS: Portable AP semi upright view at 0710 hours. Stable cardiomegaly and mediastinal contours. Chronic sternotomy. Layering pleural effusions are less apparent. No pneumothorax, pulmonary edema, consolidation. Paucity of bowel gas. Stable visualized osseous structures. Chronic left axillary and chest wall surgical clips. IMPRESSION: 1. Layering pleural effusions were better demonstrated on chest radiographs yesterday, CT cervical spine today. 2. No other acute cardiopulmonary abnormality. Electronically Signed   By: Mallory Watts M.D.   On: 05/17/2023 08:02   DG Chest 2 View Result Date: 05/17/2023 CLINICAL DATA:  83 year old female with shortness of breath, CHF. EXAM: CHEST - 2 VIEW COMPARISON:  Portable chest 04/02/2023 and earlier. FINDINGS: PA and lateral views on 05/16/2023. Decreased pulmonary vascular congestion since last month. Ongoing bilateral layering pleural effusions, small to moderate. Underlying cardiomegaly. Chronic sternotomy. No pneumothorax, active pulmonary edema, consolidation. Chronic spine degeneration and scoliosis. No acute osseous abnormality identified. Negative visible bowel gas. IMPRESSION: Ongoing small to moderate bilateral layering pleural effusions. Regressed/resolved pulmonary interstitial edema  since last month. Stable cardiomegaly. Electronically Signed   By: Mallory Watts M.D.   On: 05/17/2023 08:01   CT CERVICAL SPINE  WO CONTRAST Result Date: 05/17/2023 CLINICAL DATA:  83 year old female status post fall down stairs on Eliquis . EXAM: CT CERVICAL SPINE WITHOUT CONTRAST TECHNIQUE: Multidetector CT imaging of the cervical spine was performed without intravenous contrast. Multiplanar CT image reconstructions were also generated. RADIATION DOSE REDUCTION: This exam was performed according to the departmental dose-optimization program which includes automated exposure control, adjustment of the mA and/or kV according to patient size and/or use of iterative reconstruction technique. COMPARISON:  Head CT today.  Cervical spine CT 04/02/2023. FINDINGS: Alignment: Stable. Postoperative straightening of lordosis. Cervicothoracic junction alignment is within normal limits. Bilateral posterior element alignment is within normal limits. Skull base and vertebrae: Stable bone mineralization. Visualized skull base is intact. No atlanto-occipital dissociation. C1 and C2 appear intact and aligned. Chronic postoperative details are below. No acute osseous abnormality identified. Soft tissues and spinal canal: No prevertebral fluid or swelling. No visible canal hematoma. Calcified cervical carotid atherosclerosis with a partially retropharyngeal course. Disc levels: Chronic C4 through C6 ACDF. Solid arthrodesis, no hardware loosening. Degenerative interbody ankylosis also at C3-C4. Chronic severe adjacent segment degeneration at C6-C7 appears stable. Upper chest: Visible upper thoracic levels appears stable. Bilateral layering pleural effusions visible in the lung apices, similar to December. IMPRESSION: 1. No acute traumatic injury identified in the cervical spine. 2. Chronic C4 through C6 ACDF with solid arthrodesis. Adjacent segment ankylosis at C3-C4, and advanced chronic adjacent segment degeneration at C6-C7. 3.  Persistent layering pleural effusions. Electronically Signed   By: Mallory Watts M.D.   On: 05/17/2023 07:59   CT HEAD WO CONTRAST Result Date: 05/17/2023 CLINICAL DATA:  83 year old female status post fall down stairs on Eliquis . EXAM: CT HEAD WITHOUT CONTRAST TECHNIQUE: Contiguous axial images were obtained from the base of the skull through the vertex without intravenous contrast. RADIATION DOSE REDUCTION: This exam was performed according to the departmental dose-optimization program which includes automated exposure control, adjustment of the mA and/or kV according to patient size and/or use of iterative reconstruction technique. COMPARISON:  Head CT 04/09/2023. FINDINGS: Brain: Stable cerebral volume. No midline shift, ventriculomegaly, mass effect, evidence of mass lesion, intracranial hemorrhage or evidence of cortically based acute infarction. Chronic right MCA and left MCA/PCA watershed territory encephalomalacia is stable. Stable gray-white matter differentiation throughout the brain. Vascular: Calcified atherosclerosis at the skull base. No suspicious intracranial vascular hyperdensity. Skull: Stable.  No acute osseous abnormality identified. Sinuses/Orbits: Low-density mucosal thickening in the left maxillary sinus is new. No layering sinus hemorrhage. Other Visualized paranasal sinuses and mastoids are stable and well aerated. Other: Large left convexity scalp hematoma measures up to 2 cm in thickness. Underlying calvarium appears stable and intact. No soft tissue gas identified orbits soft tissues appears stable and negative. IMPRESSION: 1. Large left convexity scalp hematoma.  No skull fracture. 2. No acute intracranial abnormality. Stable non contrast CT appearance of chronic cerebral infarcts. 3. Mild left maxillary sinus inflammation. Electronically Signed   By: Mallory Watts M.D.   On: 05/17/2023 07:56    Procedures Procedures    Medications Ordered in ED Medications  acetaminophen  (TYLENOL )  tablet 650 mg (has no administration in time range)  acetaminophen  (TYLENOL ) tablet 650 mg (650 mg Oral Given 05/17/23 9178)    ED Course/ Medical Decision Making/ A&P Clinical Course as of 05/17/23 1428  Wed May 17, 2023  0805 No acute traumatic injury on CTH/C-spine, pleural effusion on CXR which is likely contributing to cough [VK]  1301 No fracture on CT of R shoulder, possible PNA vs  atelectasis. Favor atelectasis as patient has had no fever and no elevated WBC. Patient is otherwise stable for discharge home.  [VK]  1357 Urine with rare bacteria. Patient reported hematuria previously but no other UTI symptoms. Will not treat with antibiotics.  [VK]    Clinical Course User Index [VK] Kingsley, Demmi Sindt K, DO                                 Medical Decision Making This patient presents to the ED with chief complaint(s) of fall with pertinent past medical history of A-fib on Eliquis , CHF, OSA which further complicates the presenting complaint. The complaint involves an extensive differential diagnosis and also carries with it a high risk of complications and morbidity.    The differential diagnosis includes due to patient's fall on thinners concern for ICH, mass effect, cervical spine fracture, shoulder fracture, and knee fracture, with recent cough concern for possible pneumonia, pneumothorax, pulmonary edema, pleural effusion, viral syndrome  Additional history obtained: Additional history obtained from EMS  Records reviewed outpatient pulmonary records  ED Course and Reassessment: On patient's arrival she had low-grade temperature otherwise was hemodynamically stable in no acute distress.  She was made a prehospital arrival level 2 trauma due to her fall on thinners.  I was present at bedside on her arrival.  Her primary survey was intact.  Secondary survey was significant for a left forehead hematoma, C-spine tenderness, left shoulder contusion and tenderness and left knee tenderness.   She was placed in c-collar on arrival here.  She will have head CT and C-spine as well as chest, pelvis, left shoulder and knee x-rays.  She was given Tylenol  and ice pack and will be closely reassessed.  Independent labs interpretation:  The following labs were independently interpreted: at baseline/no acute abnormality  Independent visualization of imaging: - I independently visualized the following imaging with scope of interpretation limited to determining acute life threatening conditions related to emergency care: CTH/C-spine, CT/Xray of L shoulder, CXR, Pelvis XR, L knee XR, which revealed no acute traumatic injury  Consultation: - Consulted or discussed management/test interpretation w/ external professional: N/A  Consideration for admission or further workup: Patient has no emergent conditions requiring admission or further work-up at this time and is stable for discharge home with primary care follow-up  Social Determinants of health: N/A    Amount and/or Complexity of Data Reviewed Labs: ordered. Radiology: ordered.  Risk OTC drugs.          Final Clinical Impression(s) / ED Diagnoses Final diagnoses:  Fall, initial encounter  Traumatic hematoma of forehead, initial encounter  Acute pain of left shoulder  Chronic bilateral pleural effusions    Rx / DC Orders ED Discharge Orders     None         Kingsley, Lajune Perine K, DO 05/17/23 1428

## 2023-05-17 NOTE — Progress Notes (Signed)
 Orthopedic Tech Progress Note Patient Details:  Mallory Watts 30-Jan-1941 [id number]  Patient ID: Mallory Watts, female   DOB: Apr 23, 1940, 83 y.o.   MRN: [id number] Level II; not currently needed. [place] A Harlynn Kimbell 05/17/2023, 7:10 AM

## 2023-05-17 NOTE — ED Notes (Signed)
 Arrives via EMS from home, Level 2 activation for fall down stairs on eliquis . States she was cleaning the litter box. Left head hematoma. Reportedly speech is abnormal but also complaining of dry mouth and cough.

## 2023-05-17 NOTE — ED Notes (Signed)
 I walked pt to bathroom. Pt did well walking on her own. I was stand by assist.

## 2023-05-18 ENCOUNTER — Other Ambulatory Visit (HOSPITAL_COMMUNITY): Payer: Medicare Other

## 2023-05-19 ENCOUNTER — Encounter: Payer: Self-pay | Admitting: *Deleted

## 2023-05-23 ENCOUNTER — Telehealth: Payer: Self-pay | Admitting: Internal Medicine

## 2023-05-23 ENCOUNTER — Ambulatory Visit (HOSPITAL_COMMUNITY): Payer: Medicare Other | Attending: Internal Medicine

## 2023-05-23 DIAGNOSIS — I351 Nonrheumatic aortic (valve) insufficiency: Secondary | ICD-10-CM | POA: Insufficient documentation

## 2023-05-23 DIAGNOSIS — I340 Nonrheumatic mitral (valve) insufficiency: Secondary | ICD-10-CM | POA: Insufficient documentation

## 2023-05-23 LAB — ECHOCARDIOGRAM COMPLETE
Area-P 1/2: 5.05 cm2
MV M vel: 4.03 m/s
MV Peak grad: 65 mm[Hg]
P 1/2 time: 350 ms
Radius: 0.6 cm
S' Lateral: 3.1 cm

## 2023-05-23 NOTE — Telephone Encounter (Signed)
PT liked the sample of Trelegy and would like a RX/   [facility] [address]

## 2023-05-23 NOTE — Telephone Encounter (Signed)
ATC X1. Lmtcb

## 2023-05-25 MED ORDER — TRELEGY ELLIPTA 100-62.5-25 MCG/ACT IN AEPB: INHALATION_SPRAY | RESPIRATORY_TRACT | 12 refills | Status: DC

## 2023-05-25 NOTE — Telephone Encounter (Signed)
Pt states she has benefited from Trelegy and would like script to pharmacy.[facility] [address].

## 2023-05-25 NOTE — Telephone Encounter (Signed)
[facility] script sent to [facility]

## 2023-05-25 NOTE — Telephone Encounter (Signed)
I called and spoke with pt. I informed her of the script for Trelegy. Pt verbalized understanding. NFN

## 2023-07-05 ENCOUNTER — Emergency Department (HOSPITAL_COMMUNITY)

## 2023-07-05 ENCOUNTER — Encounter (HOSPITAL_COMMUNITY): Payer: Self-pay | Admitting: Student

## 2023-07-05 ENCOUNTER — Inpatient Hospital Stay (HOSPITAL_COMMUNITY)
Admission: EM | Admit: 2023-07-05 | Discharge: 2023-07-07 | DRG: 291 | Disposition: A | Discharge status: Home / Self Care | Attending: Family Medicine | Admitting: Family Medicine

## 2023-07-05 ENCOUNTER — Other Ambulatory Visit: Payer: Self-pay

## 2023-07-05 ENCOUNTER — Ambulatory Visit: Payer: Self-pay | Admitting: Internal Medicine

## 2023-07-05 ENCOUNTER — Observation Stay (HOSPITAL_COMMUNITY)

## 2023-07-05 DIAGNOSIS — K219 Gastro-esophageal reflux disease without esophagitis: Secondary | ICD-10-CM | POA: Diagnosis present

## 2023-07-05 DIAGNOSIS — I2510 Atherosclerotic heart disease of native coronary artery without angina pectoris: Secondary | ICD-10-CM | POA: Diagnosis present

## 2023-07-05 DIAGNOSIS — I130 Hypertensive heart and chronic kidney disease with heart failure and stage 1 through stage 4 chronic kidney disease, or unspecified chronic kidney disease: Secondary | ICD-10-CM | POA: Diagnosis not present

## 2023-07-05 DIAGNOSIS — I5033 Acute on chronic diastolic (congestive) heart failure: Secondary | ICD-10-CM | POA: Diagnosis not present

## 2023-07-05 DIAGNOSIS — I509 Heart failure, unspecified: Principal | ICD-10-CM

## 2023-07-05 DIAGNOSIS — Z923 Personal history of irradiation: Secondary | ICD-10-CM

## 2023-07-05 DIAGNOSIS — D509 Iron deficiency anemia, unspecified: Secondary | ICD-10-CM | POA: Diagnosis present

## 2023-07-05 DIAGNOSIS — I080 Rheumatic disorders of both mitral and aortic valves: Secondary | ICD-10-CM | POA: Diagnosis present

## 2023-07-05 DIAGNOSIS — Z79899 Other long term (current) drug therapy: Secondary | ICD-10-CM

## 2023-07-05 DIAGNOSIS — N1832 Chronic kidney disease, stage 3b: Secondary | ICD-10-CM | POA: Diagnosis present

## 2023-07-05 DIAGNOSIS — I5082 Biventricular heart failure: Secondary | ICD-10-CM | POA: Diagnosis present

## 2023-07-05 DIAGNOSIS — D649 Anemia, unspecified: Secondary | ICD-10-CM | POA: Diagnosis not present

## 2023-07-05 DIAGNOSIS — M810 Age-related osteoporosis without current pathological fracture: Secondary | ICD-10-CM | POA: Diagnosis present

## 2023-07-05 DIAGNOSIS — Z9221 Personal history of antineoplastic chemotherapy: Secondary | ICD-10-CM

## 2023-07-05 DIAGNOSIS — I493 Ventricular premature depolarization: Secondary | ICD-10-CM | POA: Diagnosis present

## 2023-07-05 DIAGNOSIS — Z79890 Hormone replacement therapy (postmenopausal): Secondary | ICD-10-CM

## 2023-07-05 DIAGNOSIS — E1165 Type 2 diabetes mellitus with hyperglycemia: Secondary | ICD-10-CM | POA: Diagnosis present

## 2023-07-05 DIAGNOSIS — Z853 Personal history of malignant neoplasm of breast: Secondary | ICD-10-CM

## 2023-07-05 DIAGNOSIS — I480 Paroxysmal atrial fibrillation: Secondary | ICD-10-CM | POA: Diagnosis present

## 2023-07-05 DIAGNOSIS — E1122 Type 2 diabetes mellitus with diabetic chronic kidney disease: Secondary | ICD-10-CM | POA: Diagnosis present

## 2023-07-05 DIAGNOSIS — Z833 Family history of diabetes mellitus: Secondary | ICD-10-CM

## 2023-07-05 DIAGNOSIS — Z882 Allergy status to sulfonamides status: Secondary | ICD-10-CM

## 2023-07-05 DIAGNOSIS — Z7901 Long term (current) use of anticoagulants: Secondary | ICD-10-CM

## 2023-07-05 DIAGNOSIS — Z7951 Long term (current) use of inhaled steroids: Secondary | ICD-10-CM

## 2023-07-05 DIAGNOSIS — R0602 Shortness of breath: Secondary | ICD-10-CM | POA: Diagnosis not present

## 2023-07-05 DIAGNOSIS — Z888 Allergy status to other drugs, medicaments and biological substances status: Secondary | ICD-10-CM

## 2023-07-05 DIAGNOSIS — G4733 Obstructive sleep apnea (adult) (pediatric): Secondary | ICD-10-CM | POA: Diagnosis present

## 2023-07-05 DIAGNOSIS — R053 Chronic cough: Secondary | ICD-10-CM | POA: Diagnosis present

## 2023-07-05 DIAGNOSIS — M1990 Unspecified osteoarthritis, unspecified site: Secondary | ICD-10-CM | POA: Diagnosis present

## 2023-07-05 DIAGNOSIS — Z7983 Long term (current) use of bisphosphonates: Secondary | ICD-10-CM

## 2023-07-05 DIAGNOSIS — Z96652 Presence of left artificial knee joint: Secondary | ICD-10-CM | POA: Diagnosis present

## 2023-07-05 DIAGNOSIS — E538 Deficiency of other specified B group vitamins: Secondary | ICD-10-CM | POA: Diagnosis present

## 2023-07-05 DIAGNOSIS — R195 Other fecal abnormalities: Secondary | ICD-10-CM | POA: Diagnosis present

## 2023-07-05 DIAGNOSIS — Z87891 Personal history of nicotine dependence: Secondary | ICD-10-CM

## 2023-07-05 DIAGNOSIS — Z91041 Radiographic dye allergy status: Secondary | ICD-10-CM

## 2023-07-05 DIAGNOSIS — Z951 Presence of aortocoronary bypass graft: Secondary | ICD-10-CM

## 2023-07-05 DIAGNOSIS — N179 Acute kidney failure, unspecified: Secondary | ICD-10-CM | POA: Diagnosis present

## 2023-07-05 DIAGNOSIS — Z7984 Long term (current) use of oral hypoglycemic drugs: Secondary | ICD-10-CM

## 2023-07-05 DIAGNOSIS — E785 Hyperlipidemia, unspecified: Secondary | ICD-10-CM | POA: Diagnosis present

## 2023-07-05 LAB — APTT: aPTT: 36 s (ref 24–36)

## 2023-07-05 LAB — COMPREHENSIVE METABOLIC PANEL
ALT: 36 U/L (ref 0–44)
AST: 35 U/L (ref 15–41)
Albumin: 3.4 g/dL — ABNORMAL LOW (ref 3.5–5.0)
Alkaline Phosphatase: 77 U/L (ref 38–126)
Anion gap: 9 (ref 5–15)
BUN: 30 mg/dL — ABNORMAL HIGH (ref 8–23)
CO2: 24 mmol/L (ref 22–32)
Calcium: 8.7 mg/dL — ABNORMAL LOW (ref 8.9–10.3)
Chloride: 108 mmol/L (ref 98–111)
Creatinine, Ser: 1.63 mg/dL — ABNORMAL HIGH (ref 0.44–1.00)
GFR, Estimated: 32 mL/min — ABNORMAL LOW (ref >60)
Glucose, Bld: 191 mg/dL — ABNORMAL HIGH (ref 70–99)
Potassium: 3.5 mmol/L (ref 3.5–5.1)
Sodium: 141 mmol/L (ref 135–145)
Total Bilirubin: 0.8 mg/dL (ref 0.0–1.2)
Total Protein: 6.4 g/dL — ABNORMAL LOW (ref 6.5–8.1)

## 2023-07-05 LAB — POC OCCULT BLOOD, ED: Fecal Occult Bld: NEGATIVE

## 2023-07-05 LAB — PROTIME-INR
INR: 2 — ABNORMAL HIGH (ref 0.8–1.2)
Prothrombin Time: 22.5 s — ABNORMAL HIGH (ref 11.4–15.2)

## 2023-07-05 LAB — TROPONIN I (HIGH SENSITIVITY)
Troponin I (High Sensitivity): 10 ng/L (ref <18)
Troponin I (High Sensitivity): 9 ng/L (ref <18)

## 2023-07-05 LAB — HEMOGLOBIN AND HEMATOCRIT, BLOOD
HCT: 36.7 % (ref 36.0–46.0)
Hemoglobin: 10.3 g/dL — ABNORMAL LOW (ref 12.0–15.0)

## 2023-07-05 LAB — CBC WITH DIFFERENTIAL/PLATELET
Abs Immature Granulocytes: 0.02 10*3/uL (ref 0.00–0.07)
Basophils Absolute: 0 10*3/uL (ref 0.0–0.1)
Basophils Relative: 1 %
Eosinophils Absolute: 0.2 10*3/uL (ref 0.0–0.5)
Eosinophils Relative: 3 %
HCT: 32.2 % — ABNORMAL LOW (ref 36.0–46.0)
Hemoglobin: 9.6 g/dL — ABNORMAL LOW (ref 12.0–15.0)
Immature Granulocytes: 0 %
Lymphocytes Relative: 23 %
Lymphs Abs: 1.4 10*3/uL (ref 0.7–4.0)
MCH: 25.2 pg — ABNORMAL LOW (ref 26.0–34.0)
MCHC: 29.8 g/dL — ABNORMAL LOW (ref 30.0–36.0)
MCV: 84.5 fL (ref 80.0–100.0)
Monocytes Absolute: 0.6 10*3/uL (ref 0.1–1.0)
Monocytes Relative: 10 %
Neutro Abs: 3.9 10*3/uL (ref 1.7–7.7)
Neutrophils Relative %: 63 %
Platelets: 241 10*3/uL (ref 150–400)
RBC: 3.81 MIL/uL — ABNORMAL LOW (ref 3.87–5.11)
RDW: 18.3 % — ABNORMAL HIGH (ref 11.5–15.5)
WBC: 6.1 10*3/uL (ref 4.0–10.5)
nRBC: 0 % (ref 0.0–0.2)

## 2023-07-05 LAB — TYPE AND SCREEN
ABO/RH(D): A POS
Antibody Screen: NEGATIVE

## 2023-07-05 LAB — BRAIN NATRIURETIC PEPTIDE: B Natriuretic Peptide: 452.9 pg/mL — ABNORMAL HIGH (ref 0.0–100.0)

## 2023-07-05 LAB — CBG MONITORING, ED: Glucose-Capillary: 96 mg/dL (ref 70–99)

## 2023-07-05 LAB — TSH: TSH: 2.307 u[IU]/mL (ref 0.350–4.500)

## 2023-07-05 MED ORDER — ACETAMINOPHEN 325 MG PO TABS
650.0000 mg | ORAL_TABLET | Freq: Four times a day (QID) | ORAL | Status: DC | PRN
  Administered 2023-07-06 – 2023-07-07 (×2): 650 mg via ORAL
  Filled 2023-07-05 (×2): qty 2

## 2023-07-05 MED ORDER — PANTOPRAZOLE SODIUM 40 MG PO TBEC: 40.0000 mg | DELAYED_RELEASE_TABLET | Freq: Every day | ORAL | Status: DC

## 2023-07-05 MED ORDER — DRONEDARONE HCL 400 MG PO TABS
400.0000 mg | ORAL_TABLET | Freq: Two times a day (BID) | ORAL | Status: DC
  Administered 2023-07-06 – 2023-07-07 (×3): 400 mg via ORAL
  Filled 2023-07-05 (×4): qty 1

## 2023-07-05 MED ORDER — PANTOPRAZOLE SODIUM 40 MG PO TBEC
40.0000 mg | DELAYED_RELEASE_TABLET | Freq: Two times a day (BID) | ORAL | Status: DC
  Administered 2023-07-05 – 2023-07-07 (×4): 40 mg via ORAL
  Filled 2023-07-05 (×4): qty 1

## 2023-07-05 MED ORDER — ATORVASTATIN CALCIUM 40 MG PO TABS
80.0000 mg | ORAL_TABLET | Freq: Every day | ORAL | Status: DC
  Administered 2023-07-06 – 2023-07-07 (×2): 80 mg via ORAL
  Filled 2023-07-05 (×2): qty 2

## 2023-07-05 MED ORDER — ACETAMINOPHEN 650 MG RE SUPP
650.0000 mg | Freq: Four times a day (QID) | RECTAL | Status: DC | PRN
Start: 2023-07-05 — End: 2023-07-07

## 2023-07-05 MED ORDER — METOPROLOL SUCCINATE ER 50 MG PO TB24
25.0000 mg | ORAL_TABLET | Freq: Two times a day (BID) | ORAL | Status: DC
  Administered 2023-07-05 – 2023-07-07 (×4): 25 mg via ORAL
  Filled 2023-07-05 (×4): qty 1

## 2023-07-05 MED ORDER — UMECLIDINIUM BROMIDE 62.5 MCG/ACT IN AEPB
1.0000 | INHALATION_SPRAY | Freq: Every day | RESPIRATORY_TRACT | Status: DC
  Filled 2023-07-05: qty 7

## 2023-07-05 MED ORDER — EZETIMIBE 10 MG PO TABS
10.0000 mg | ORAL_TABLET | Freq: Every day | ORAL | Status: DC
  Administered 2023-07-06: 10 mg via ORAL
  Filled 2023-07-05: qty 1

## 2023-07-05 MED ORDER — FUROSEMIDE 10 MG/ML IJ SOLN: 40.0000 mg | Freq: Every day | INTRAMUSCULAR | Status: DC

## 2023-07-05 MED ORDER — FUROSEMIDE 10 MG/ML IJ SOLN
40.0000 mg | Freq: Once | INTRAMUSCULAR | Status: AC
  Administered 2023-07-05: 40 mg via INTRAVENOUS
  Filled 2023-07-05: qty 4

## 2023-07-05 MED ORDER — METOPROLOL TARTRATE 25 MG PO TABS: 25.0000 mg | ORAL_TABLET | Freq: Two times a day (BID) | ORAL | Status: DC

## 2023-07-05 MED ORDER — FLUTICASONE FUROATE-VILANTEROL 100-25 MCG/ACT IN AEPB
1.0000 | INHALATION_SPRAY | Freq: Every day | RESPIRATORY_TRACT | Status: DC
  Filled 2023-07-05: qty 28

## 2023-07-05 MED ORDER — PREDNISONE 10 MG PO TABS
40.0000 mg | ORAL_TABLET | Freq: Every day | ORAL | 0 refills | Status: DC
Start: 2023-07-05 — End: 2023-07-10

## 2023-07-05 NOTE — ED Provider Triage Note (Signed)
 Emergency Medicine Provider Triage Evaluation Note  Mallory Watts , a 83 y.o. female  was evaluated in triage.  Pt complains of abnormal lab work. No acute worsening. She has been having these symptoms for the past 6 weeks to months. She has paperwork with her mentioning new anemia with black stools (has been going on for few months, lower leg edema chronci, AKI with Cr 1.6, unimproved cough, and SOB. She reports that she sent here to be admitted.   Review of Systems  Positive:  Negative:   Physical Exam  BP 121/71   Pulse 72   Temp 97.8 F (36.6 C) (Oral)   Resp 18   LMP 04/11/1994   SpO2 93%  Gen:   Awake, no distress   Resp:  Normal effort  MSK:   Moves extremities without difficulty  Other:  1-2+ pitting edema, legs symmetric, does have some wheezing in lower lungs. Appears jaundices instead of pale.   Medical Decision Making  Medically screening exam initiated at 6:28 PM.  Appropriate orders placed.  Mallory Watts was informed that the remainder of the evaluation will be completed by another provider, this initial triage assessment does not replace that evaluation, and the importance of remaining in the ED until their evaluation is complete.  Patient comes with recent lab work. CXR did not cross over from [place]. Will order lab work and type and Screen. It appears the H was 10.7 down from 12 in 12/2021.   Achille Rich, [place] 07/05/23 [phone]

## 2023-07-05 NOTE — Telephone Encounter (Signed)
 Chief Complaint: SOB Symptoms: productive cough, wheezing Frequency: 6 weeks Pertinent Negatives: Patient denies fever, URI sx, CP Disposition: [] ED /[] Urgent Care (no appt availability in office) / [x] Appointment(In office/virtual)/ []  [place]/ [] Home Care/ [] Refused Recommended Disposition /[] [place] [facility]/ [x]  Follow-up with PCP Additional Notes: Pt c/o of SOB, cough x 6 weeks. Reports recently saw Dr. Maple Hudson and was started on Trelegy and is taking as prescribed. Pt reports sx have not improved much. Pt is also concerned with potential medication interactions as she has hx of Afib. Pt denies fever, URI sx. Does endorse chest tightness r/t SOB/wheezing. Triager attempted to schedule with pulm, but no access based on dispo. Triager instructed to call PCP for appt. Patient verbalized understanding to go to UC if PCP unavailable and to call back with worsening symptoms. Triager will forward encounter for Dr. Maple Hudson to review and advise. Patient verbalized understanding and is expecting call back from office for next steps.   Copied from CRM [id number]. Topic: Clinical - Red Word Triage >> Jul 05, 2023  9:10 AM Gaetano Hawthorne wrote: [facility] that prompted transfer to Nurse Triage: Patient has been coughing, no energy, challenges with making it to the car - She was placed on Fluticasone-Umeclidin-Vilant (TRELEGY ELLIPTA) 100-62.5-25 MCG/ACT AEPB by Dr. Maple Hudson and she has not seen any improvements and stated that she is feeling worse. Reason for Disposition  [1] MILD difficulty breathing (e.g., minimal/no SOB at rest, SOB with walking, pulse <100) AND [2] still present when not coughing  Answer Assessment - Initial Assessment Questions E2C2 Pulmonary Triage - Initial Assessment Questions "Chief Complaint (e.g., cough, sob, wheezing, fever, chills, sweat or additional symptoms) *Go to specific symptom protocol after initial questions. SOB, cough Also has concern with med interactions - hx  of afib  "How long have symptoms been present?" Since last visit in February - about 6 weeks  Have you tested for COVID or Flu? Note: If not, ask patient if a home test can be taken. If so, instruct patient to call back for positive results. No  MEDICINES:   "Have you used any OTC meds to help with symptoms?" No If yes, ask "What medications?" N/a  "Have you used your inhalers/maintenance medication?" Yes If yes, "What medications?" Trelegy - 1 puff once a day   If inhaler, ask "How many puffs and how often?" Note: Review instructions on medication in the chart. See above  OXYGEN: "Do you wear supplemental oxygen?" No If yes, "How many liters are you supposed to use?" BiPAP at night - reports can only tolerate for about an hour  "Do you monitor your oxygen levels?" No If yes, "What is your reading (oxygen level) today?" N/a  "What is your usual oxygen saturation reading?"  (Note: Pulmonary O2 sats should be 90% or greater) N/a    3. SPUTUM: "Describe the color of your sputum" (none, dry cough; clear, white, yellow, green)     Clear to yellow, sometimes brown "but that might be part of the Trelegy, the top of my mouth sometimes gets coated with something with the trelegy" 4. HEMOPTYSIS: "Are you coughing up any blood?" If so ask: "How much?" (flecks, streaks, tablespoons, etc.)     no 5. DIFFICULTY BREATHING: "Are you having difficulty breathing?" If Yes, ask: "How bad is it?" (e.g., mild, moderate, severe)    - MILD: No SOB at rest, mild SOB with walking, speaks normally in sentences, can lie down, no retractions, pulse < 100.    -  MODERATE: SOB at rest, SOB with minimal exertion and prefers to sit, cannot lie down flat, speaks in phrases, mild retractions, audible wheezing, pulse 100-120.    - SEVERE: Very SOB at rest, speaks in single words, struggling to breathe, sitting hunched forward, retractions, pulse > 120      Moderate-SOB at rest and exertion 6. FEVER: "Do you  have a fever?" If Yes, ask: "What is your temperature, how was it measured, and when did it start?"     no 7. CARDIAC HISTORY: "Do you have any history of heart disease?" (e.g., heart attack, congestive heart failure)      Afib CHF per chart 8. LUNG HISTORY: "Do you have any history of lung disease?"  (e.g., pulmonary embolus, asthma, emphysema)     OSA 9. PE RISK FACTORS: "Do you have a history of blood clots?" (or: recent major surgery, recent prolonged travel, bedridden)     no 10. OTHER SYMPTOMS: "Do you have any other symptoms?" (e.g., runny nose, wheezing, chest pain)       Wheezing - triager does not appreciate any audible wheezing during call "Chest pain if I'm trying to walk to the car and can't make it, I have to stop and catch my breath from wheezing" - denies elephant sitting on chest, CP r/t wheezing/SOB  Protocols used: Cough - Acute Productive-A-AH

## 2023-07-05 NOTE — Telephone Encounter (Signed)
 Continue Trelegy I have sent in a prescription for prednisone 40 mg a day for 5 days Please call and inform patient If no improvement then she will need assessment at urgent care

## 2023-07-05 NOTE — ED Triage Notes (Addendum)
 Pt sent by PCP for [facility], wheezing, intermittent chest pain, black stools, and leg swelling. Hx of COPD and Afib

## 2023-07-05 NOTE — Telephone Encounter (Signed)
 I called and left the pt a detailed msg letting her know of response per Mannam ok per DPR.

## 2023-07-05 NOTE — ED Provider Notes (Signed)
 [place] EMERGENCY DEPARTMENT AT [facility] Provider Note   CSN: [id number] Arrival date & time: 07/05/23  1815     History  Chief Complaint  Patient presents with   Shortness of Breath   Leg Swelling    Mallory Watts is a 83 y.o. female.  HPI 83 year old female presents from her PCP office with concern for possible GI bleed.  She has been having a cough for the past 3 months or so.  Is mostly productive.  She is also been having shortness of breath and intermittent chest pain, most recently some chest pressure yesterday.  She is also been having worsening bilateral lower extremity edema despite taking her 40 mg furosemide.  She has been having dark but not tarry stool over the last 2 weeks.  She denies any abdominal pain.  She feels all of her week.  She had labs done in the office today that showed a mild AKI with a creatinine up to 1.6 and a hemoglobin of 10.7.  Home Medications Prior to Admission medications   Medication Sig Start Date End Date Taking? Authorizing Provider  acetaminophen (TYLENOL) 650 MG CR tablet Take 1,300 mg by mouth 2 (two) times daily as needed for pain.   Yes [provider]  alendronate (FOSAMAX) 70 MG tablet Take 70 mg by mouth every Saturday. Take with a full glass of water on an empty stomach.   Yes [provider]  apixaban (ELIQUIS) 5 MG TABS tablet Take 1 tablet (5 mg total) by mouth 2 (two) times daily. 10/18/19  Yes Lyn Records, MD  atorvastatin (LIPITOR) 80 MG tablet TAKE 1 TABLET BY MOUTH EVERY DAY 03/09/21  Yes Pricilla Riffle, MD  Cholecalciferol (VITAMIN D-3 PO) Take 1 capsule by mouth at bedtime.   Yes [provider]  dronedarone (MULTAQ) 400 MG tablet TAKE 1 TABLET (400 MG TOTAL) BY MOUTH 2 (TWO) TIMES DAILY WITH A MEAL. 10/03/22  Yes Pricilla Riffle, MD  ezetimibe (ZETIA) 10 MG tablet TAKE 1 TABLET BY MOUTH EVERY DAY 05/17/23  Yes Pricilla Riffle, MD  fexofenadine (ALLEGRA) 180 MG tablet Take 180 mg by  mouth daily as needed for allergies or rhinitis.   Yes [provider]  furosemide (LASIX) 40 MG tablet Take 1 tablet (40 mg total) by mouth daily. Patient taking differently: Take 40 mg by mouth in the morning. 05/08/23  Yes Swinyer, Zachary George, NP  Magnesium 250 MG TABS Take 250 mg by mouth daily as needed (for leg cramps or constipation).   Yes [provider]  meclizine (ANTIVERT) 25 MG tablet Take 25 mg by mouth 3 (three) times daily as needed for dizziness.   Yes [provider]  metoprolol succinate (TOPROL-XL) 25 MG 24 hr tablet Take 25 mg by mouth 2 (two) times daily.   Yes [provider]  mometasone (NASONEX) 50 MCG/ACT nasal spray PLACE 2 SPRAYS INTO THE NOSE DAILY. Patient taking differently: Place 2 sprays into the nose daily as needed (for allergies). 12/27/21  Yes Cobb, Ruby Cola, NP  neomycin-polymyxin b-dexamethasone (MAXITROL) 3.5-10000-0.1 OINT Place 1 Application into both eyes 2 (two) times daily as needed (AS DIRECTED).   Yes [provider]  nitroGLYCERIN (NITROSTAT) 0.4 MG SL tablet PLACE 1 TABLET UNDER THE TONGUE EVERY 5 MINUTES AS NEEDED FOR CHEST PAIN. 12/29/21  Yes Pricilla Riffle, MD  omeprazole (PRILOSEC) 40 MG capsule Take 40 mg by mouth daily before breakfast. 10/27/17  Yes [provider]  potassium chloride (MICRO-K) 10 MEQ CR capsule Take 10 mEq by mouth 2 (two) times daily.   Yes [provider]  solifenacin (VESICARE) 5 MG tablet One daily as needed Patient taking differently: Take 5 mg by mouth daily. 07/21/14  Yes Verner Chol, CNM  TUMS ULTRA 1000 1000 MG chewable tablet Chew 500 mg by mouth 2 (two) times daily.   Yes [provider]  valACYclovir (VALTREX) 500 MG tablet Take 1 tablet (500 mg total) by mouth daily. Take one tablet by mouth twice a day for 3 days as needed for an outbreak. Patient taking differently: Take 500 mg by mouth 2 (two) times daily as needed ("for an outbreak"-  take for 3 days, as directed). 01/24/18  Yes Amundson Shirley Friar, MD  XDEMVY 0.25 % SOLN Place 1 drop into both eyes in the morning and at bedtime.   Yes [provider]  zolpidem (AMBIEN) 10 MG tablet Take 5-10 mg by mouth at bedtime as needed for sleep. 01/13/23  Yes [provider]  estradiol (ESTRACE VAGINAL) 0.1 MG/GM vaginal cream Place 1 g vaginally 3 (three) times a week. Patient not taking: Reported on 07/05/2023 02/17/23   Arlie Solomons B, NP  Fluticasone-Umeclidin-Vilant (TRELEGY ELLIPTA) 100-62.5-25 MCG/ACT AEPB Inhale 1 puff then rinse mouth, once daily Patient not taking: Reported on 07/05/2023 05/25/23   Waymon Budge, MD  metoprolol tartrate (LOPRESSOR) 25 MG tablet Take 1 tablet (25 mg total) by mouth 2 (two) times daily. Patient not taking: Reported on 07/05/2023 10/03/22   Pricilla Riffle, MD  potassium chloride SA (KLOR-CON M) 20 MEQ tablet Take 1 tablet (20 mEq total) by mouth 2 (two) times daily. Patient not taking: Reported on 07/05/2023 05/08/23   Swinyer, Zachary George, NP  predniSONE (DELTASONE) 10 MG tablet Take 4 tablets (40 mg total) by mouth daily with breakfast for 5 days. Patient not taking: Reported on 07/05/2023 07/05/23 07/10/23  Chilton Greathouse, MD      Allergies    Atenolol, Gadolinium, and Sulfonamide derivatives    Review of Systems   Review of Systems  Constitutional:  Positive for fatigue. Negative for fever.  Respiratory:  Positive for cough and shortness of breath.   Cardiovascular:  Positive for chest pain and leg swelling.  Gastrointestinal:  Negative for abdominal pain.    Physical Exam Updated Vital Signs BP (!) 152/98 (BP Location: Right Arm)   Pulse 79   Temp 98 F (36.7 C) (Oral)   Resp (!) 21   LMP 04/11/1994   SpO2 96%  Physical Exam Vitals and nursing note reviewed. Exam conducted with a chaperone present.  Constitutional:      Appearance: She is well-developed.  HENT:     Head: Normocephalic and atraumatic.   Cardiovascular:     Rate and Rhythm: Normal rate and regular rhythm.     Heart sounds: Normal heart sounds.  Pulmonary:     Effort: Pulmonary effort is normal.     Breath sounds: Normal breath sounds.     Comments: Frequent cough when trying to take breaths for pulmonary exam, exam is limited. Abdominal:     Palpations: Abdomen is soft.     Tenderness: There is no abdominal tenderness.  Genitourinary:    Rectum: Guaiac result negative. No mass, tenderness or external hemorrhoid.  Skin:    General: Skin is warm and dry.  Neurological:     Mental Status: She is alert.     ED Results /  Procedures / Treatments   Labs (all labs ordered are listed, but only abnormal results are displayed) Labs Reviewed  BRAIN NATRIURETIC PEPTIDE - Abnormal; Notable for the following components:      Result Value   B Natriuretic Peptide 452.9 (*)    All other components within normal limits  CBC WITH DIFFERENTIAL/PLATELET - Abnormal; Notable for the following components:   RBC 3.81 (*)    Hemoglobin 9.6 (*)    HCT 32.2 (*)    MCH 25.2 (*)    MCHC 29.8 (*)    RDW 18.3 (*)    All other components within normal limits  COMPREHENSIVE METABOLIC PANEL - Abnormal; Notable for the following components:   Glucose, Bld 191 (*)    BUN 30 (*)    Creatinine, Ser 1.63 (*)    Calcium 8.7 (*)    Total Protein 6.4 (*)    Albumin 3.4 (*)    GFR, Estimated 32 (*)    All other components within normal limits  PROTIME-INR - Abnormal; Notable for the following components:   Prothrombin Time 22.5 (*)    INR 2.0 (*)    All other components within normal limits  APTT  TSH  HEMOGLOBIN A1C  BASIC METABOLIC PANEL  CBC  FERRITIN  IRON AND TIBC  VITAMIN B12  FOLATE  HEMOGLOBIN AND HEMATOCRIT, BLOOD  POC OCCULT BLOOD, ED  CBG MONITORING, ED  TYPE AND SCREEN  TROPONIN I (HIGH SENSITIVITY)  TROPONIN I (HIGH SENSITIVITY)    EKG EKG Interpretation Date/Time:  Wednesday July 05 2023 19:42:02  EDT Ventricular Rate:  74 PR Interval:    QRS Duration:  102 QT Interval:  651 QTC Calculation: 723 R Axis:   106  Text Interpretation: Atrial fibrillation Ventricular premature complex Right axis deviation Low voltage, precordial leads Borderline T abnormalities, diffuse leads Prolonged QT interval overall similar to Dec 2024 Confirmed by Pricilla Loveless [phone]) on 07/05/2023 7:54:28 PM  Radiology US RENAL Result Date: 07/05/2023 CLINICAL DATA:  Acute kidney injury. EXAM: RENAL / URINARY TRACT ULTRASOUND COMPLETE COMPARISON:  None Available. FINDINGS: Right Kidney: Renal measurements: 10.8 x 4.9 x 5.8 cm = volume: 161 mL. Echogenicity within normal limits. No mass or hydronephrosis visualized. Left Kidney: Renal measurements: 11 x 4.7 x 4.8 cm = volume: 129 mL. Echogenicity within normal limits. No mass or hydronephrosis visualized. Bladder: Appears normal for degree of bladder distention. Other: Incidental finding of a left pleural effusion. IMPRESSION: Normal ultrasound appearance of the kidneys. No mass or hydronephrosis. Left pleural effusion is incidentally demonstrated. Electronically Signed   By: Burman Nieves M.D.   On: 07/05/2023 22:09   DG Chest 2 View Result Date: 07/05/2023 CLINICAL DATA:  Dyspnea EXAM: CHEST - 2 VIEW COMPARISON:  05/17/2023 FINDINGS: Small bilateral pleural effusions have developed. Lungs are clear. No pneumothorax. Median sternotomy has been performed. Cardiac size within normal limits. No acute bone abnormality. IMPRESSION: 1. Small bilateral pleural effusions. Electronically Signed   By: Helyn Numbers M.D.   On: 07/05/2023 19:59    Procedures Procedures    Medications Ordered in ED Medications  acetaminophen (TYLENOL) tablet 650 mg (has no administration in time range)    Or  acetaminophen (TYLENOL) suppository 650 mg (has no administration in time range)  atorvastatin (LIPITOR) tablet 80 mg (has no administration in time range)  dronedarone (MULTAQ)  tablet 400 mg (has no administration in time range)  ezetimibe (ZETIA) tablet 10 mg (has no administration in time range)  fluticasone furoate-vilanterol (BREO ELLIPTA)  100-25 MCG/ACT 1 puff (has no administration in time range)    And  umeclidinium bromide (INCRUSE ELLIPTA) 62.5 MCG/ACT 1 puff (has no administration in time range)  pantoprazole (PROTONIX) EC tablet 40 mg (40 mg Oral Given 07/05/23 2246)  furosemide (LASIX) injection 40 mg (has no administration in time range)  metoprolol succinate (TOPROL-XL) 24 hr tablet 25 mg (25 mg Oral Given 07/05/23 2246)  furosemide (LASIX) injection 40 mg (40 mg Intravenous Given 07/05/23 2033)    ED Course/ Medical Decision Making/ A&P                                 Medical Decision Making Amount and/or Complexity of Data Reviewed External Data Reviewed: labs and notes.    Details: PCP notes/labs from today, brought by patient Labs: ordered.    Details: Hgb 9.6 Radiology: ordered and independent interpretation performed.    Details: Mild pleural effusions ECG/medicine tests: ordered and independent interpretation performed.    Details: No ischemia  Risk Prescription drug management. Decision regarding hospitalization.   Patient has multiple abnormalities that are likely affecting her including a mild AKI, acute on chronic anemia, and pleural effusions likely from CHF.  She is not in distress but I think will need diuresis as she has been compliant with her Lasix and is still getting fluid overload as well as her drop in hemoglobin.  Her hemoglobin in the office today was 10.7 and today is 9.6 though it does not seem like she has had any bloody stools today and there was no melena on my exam.  And she will need admission for further monitoring.  Discussed with Dr. Austin Miles.        Final Clinical Impression(s) / ED Diagnoses Final diagnoses:  Acute on chronic congestive heart failure, unspecified heart failure type [facility])    Rx / DC  Orders ED Discharge Orders     None         Pricilla Loveless, MD 07/05/23 2248

## 2023-07-05 NOTE — Addendum Note (Signed)
 Addended byChilton Greathouse on: 07/05/2023 10:43 AM   Modules accepted: Orders

## 2023-07-05 NOTE — ED Notes (Signed)
 [place] abs

## 2023-07-05 NOTE — H&P (Signed)
 History and Physical    Patient: Mallory Watts MVH:[id number] DOB: [birth year] DOA: 07/05/2023 DOS: the patient was seen and examined on 07/05/2023 PCP: Creola Corn, MD  Patient coming from: Home  Chief Complaint:  Chief Complaint  Patient presents with   Shortness of Breath   Leg Swelling   HPI: [name] is a 83 y.o. female with medical history significant of chronic diastolic heart failure, chronic cough, paroxysmal atrial fibrillation, CAD status post CABG, OSA, GERD, hyperlipidemia who presents to the ED with multiple complaints.  Reports that her primary reason for coming to the ED was due to concern for possible GI bleed.  She reports that she has been having darker than usual stools for the past couple of weeks.  Reports that stools have not been tarry.  She denies any abdominal pain during this time.  She has been maintaining adequate p.o. intake.  She has been adherent with her home Eliquis.  She also reports a mildly productive cough over the past 3 months.  She has also been noting shortness of breath with exertion and intermittent substernal chest pain for the past couple of weeks.  States that her chest pain comes and goes.  She is currently chest pain-free.  She does endorse worsening bilateral lower extremity edema over the past couple of weeks despite adherence with her home Lasix.  States that she had lab work done at her primary care doctor's office today that showed a mild AKI with creatinine 1.6 and slightly low hemoglobin of 10.7.  She denies any fevers, chills, nausea, vomiting, palpitations, abdominal pain, urinary changes.  She does endorse generalized weakness.  ED course: Vital signs stable.  CBC with hemoglobin 9.6, otherwise unremarkable.  CMP with hyperglycemia to 191, creatinine 1.63 (baseline around 1.3).  FOBT negative.  BNP 453.  Troponin 10.  INR 2.0.  EKG showing atrial fibrillation with ventricular premature complex, no acute ischemic changes  noted.  Chest x-ray showing small bilateral pleural effusions.  Given dose of IV Lasix 40 mg in the ED.  [facility] hospitalist asked to evaluate patient for admission.  Review of Systems: As mentioned in the history of present illness. All other systems reviewed and are negative. Past Medical History:  Diagnosis Date   Arthritis    Atrial fibrillation (HCC)    not symptomatic -on metoprolol BID for this    CAD (coronary artery disease)    s/p CAGB 2004 at [facility]   Cancer [facility]) 1998   breast cancer--left   Dysrhythmia    atrial flutter/fib   GERD (gastroesophageal reflux disease)    Hypertension    Osteopenia    Personal history of chemotherapy 1998   Personal history of radiation therapy 1998   PONV (postoperative nausea and vomiting)    Shortness of breath    exertion   STD (sexually transmitted disease)    HSV   Urinary incontinence    Past Surgical History:  Procedure Laterality Date   ANTERIOR CERVICAL DECOMP/DISCECTOMY FUSION N/A 01/14/2014   Procedure: ANTERIOR CERVICAL DECOMPRESSION/DISCECTOMY FUSION CERVICAL FOUR-FIVE,CERVICAL FIVE SIX;  Surgeon: Barnett Abu, MD;  Location: [facility] OR[facility];  Service: Neurosurgery;  Laterality: N/A;   BREAST EXCISIONAL BIOPSY Left    BREAST LUMPECTOMY Left 1998   BREAST SURGERY  1998   left lumpectomy for breast ca   COLONOSCOPY     CORONARY ARTERY BYPASS GRAFT  2004   at [facility]   DILATION AND CURETTAGE OF UTERUS  2006   heart bypass  2003   -single   POLYPECTOMY     TONSILLECTOMY     TOTAL KNEE ARTHROPLASTY Left 11/28/2016   Procedure: LEFT TOTAL KNEE ARTHROPLASTY;  Surgeon: Ollen Gross, MD;  Location: [facility] ORS;  Service: Orthopedics;  Laterality: Left;  Canal block   Social History:  reports that she has quit smoking. Her smoking use included cigarettes. She has never used smokeless tobacco. She reports current alcohol use of about 1.0 standard drink of alcohol per week. She reports that she does not use drugs.  Allergies  Allergen  Reactions   Atenolol Other (See Comments)   Gadolinium Nausea Only and Other (See Comments)     Desc: PATIENT FELT DIZZY AND NAUSEOUS AFTER GAD INJECTION. NO VOMITING,NO HIVES, NO RASH., Onset Date: [id number]    Gadolinium Derivatives Nausea Only   Sulfonamide Derivatives     Other Reaction(s): hives    Family History  Problem Relation Age of Onset   Diabetes Mother        AODM   Stroke Mother    Diabetes Brother    Stroke Father    Colon cancer Neg Hx    Colon polyps Neg Hx    Esophageal cancer Neg Hx    Rectal cancer Neg Hx    Stomach cancer Neg Hx     Prior to Admission medications   Medication Sig Start Date End Date Taking? Authorizing Provider  acetaminophen (TYLENOL) 650 MG CR tablet Take 1,300 mg by mouth 2 (two) times daily as needed for pain.    [provider]  alendronate (FOSAMAX) 70 MG tablet Take 70 mg by mouth once a week. Take with a full glass of water on an empty stomach.    [provider]  apixaban (ELIQUIS) 5 MG TABS tablet Take 1 tablet (5 mg total) by mouth 2 (two) times daily. 10/18/19   Lyn Records, MD  atorvastatin (LIPITOR) 80 MG tablet TAKE 1 TABLET BY MOUTH EVERY DAY 03/09/21   Pricilla Riffle, MD  Calcium Carbonate Antacid (TUMS PO) Take 1 tablet by mouth daily.    [provider]  dronedarone (MULTAQ) 400 MG tablet TAKE 1 TABLET (400 MG TOTAL) BY MOUTH 2 (TWO) TIMES DAILY WITH A MEAL. 10/03/22   Pricilla Riffle, MD  estradiol (ESTRACE VAGINAL) 0.1 MG/GM vaginal cream Place 1 g vaginally 3 (three) times a week. 02/17/23   Chrzanowski, Clearnce Hasten B, NP  ezetimibe (ZETIA) 10 MG tablet TAKE 1 TABLET BY MOUTH EVERY DAY 05/17/23   Pricilla Riffle, MD  fexofenadine (ALLEGRA) 180 MG tablet Take 180 mg by mouth daily as needed.     [provider]  Fluticasone-Umeclidin-Vilant (TRELEGY ELLIPTA) 100-62.5-25 MCG/ACT AEPB Inhale 1 puff then rinse mouth, once daily 05/25/23   Jetty Duhamel D, MD  furosemide (LASIX) 40 MG tablet Take 1 tablet  (40 mg total) by mouth daily. 05/08/23   Swinyer, Zachary George, NP  Magnesium 250 MG TABS Take 250 mg by mouth daily as needed (LEG CRAMPS). Reported on 05/01/2015    [provider]  meclizine (ANTIVERT) 12.5 MG tablet Take 12.5 mg by mouth 3 (three) times daily as needed for dizziness.    [provider]  metoprolol tartrate (LOPRESSOR) 25 MG tablet Take 1 tablet (25 mg total) by mouth 2 (two) times daily. 10/03/22   Pricilla Riffle, MD  mometasone (NASONEX) 50 MCG/ACT nasal spray PLACE 2 SPRAYS INTO THE NOSE DAILY. 12/27/21   Cobb, Ruby Cola, NP  nitroGLYCERIN (NITROSTAT) 0.4  MG SL tablet PLACE 1 TABLET UNDER THE TONGUE EVERY 5 MINUTES AS NEEDED FOR CHEST PAIN. 12/29/21   Pricilla Riffle, MD  omeprazole (PRILOSEC) 40 MG capsule Take 40 mg by mouth daily. 10/27/17   [provider]  potassium chloride SA (KLOR-CON M) 20 MEQ tablet Take 1 tablet (20 mEq total) by mouth 2 (two) times daily. 05/08/23   Swinyer, Zachary George, NP  predniSONE (DELTASONE) 10 MG tablet Take 4 tablets (40 mg total) by mouth daily with breakfast for 5 days. 07/05/23 07/10/23  Chilton Greathouse, MD  solifenacin (VESICARE) 5 MG tablet One daily as needed 07/21/14   Verner Chol, CNM  triamterene-hydrochlorothiazide (DYAZIDE) 37.5-25 MG capsule Take 1 capsule by mouth daily.    [provider]  valACYclovir (VALTREX) 500 MG tablet Take 1 tablet (500 mg total) by mouth daily. Take one tablet by mouth twice a day for 3 days as needed for an outbreak. 01/24/18   Patton Salles, MD  zolpidem (AMBIEN) 10 MG tablet Take 10 mg by mouth at bedtime as needed for sleep. 01/13/23   [provider]    Physical Exam: Vitals:   07/05/23 1822  BP: 121/71  Pulse: 72  Resp: 18  Temp: 97.8 F (36.6 C)  TempSrc: Oral  SpO2: 93%   Physical Exam Constitutional:      Appearance: She is well-developed. She is obese. She is not ill-appearing.  HENT:     Head: Normocephalic and atraumatic.      Mouth/Throat:     Mouth: Mucous membranes are moist.     Pharynx: Oropharynx is clear. No oropharyngeal exudate.  Eyes:     General: No scleral icterus.    Extraocular Movements: Extraocular movements intact.     Pupils: Pupils are equal, round, and reactive to light.  Cardiovascular:     Comments: Irregularly irregular rhythm, currently rate controlled. Pulmonary:     Effort: Pulmonary effort is normal. No respiratory distress.     Breath sounds: No wheezing or rhonchi.     Comments: Bibasilar crackles noted on exam. Saturating >92% on RA. Abdominal:     General: Bowel sounds are normal. There is no distension.     Palpations: Abdomen is soft.     Tenderness: There is no abdominal tenderness. There is no guarding or rebound.  Musculoskeletal:        General: Normal range of motion.     Cervical back: Normal range of motion.     Comments: 2+ pitting edema in bilateral LE up to knees.  Skin:    General: Skin is warm and dry.  Neurological:     General: No focal deficit present.     Mental Status: She is alert and oriented to person, place, and time.  Psychiatric:        Mood and Affect: Mood normal.        Behavior: Behavior normal.     Data Reviewed:  There are no new results to review at this time.    Latest Ref Rng & Units 07/05/2023    6:41 PM 05/17/2023    7:21 AM 05/17/2023    7:03 AM  CBC  WBC 4.0 - 10.5 K/uL 6.1   7.2   Hemoglobin 12.0 - 15.0 g/dL 9.6  16.1  09.6   Hematocrit 36.0 - 46.0 % 32.2  34.0  35.8   Platelets 150 - 400 K/uL 241   256       Latest Ref Rng &  Units 07/05/2023    6:41 PM 05/17/2023    7:21 AM 05/17/2023    7:03 AM  CMP  Glucose 70 - 99 mg/dL 324  401  027   BUN 8 - 23 mg/dL 30  24  24    Creatinine 0.44 - 1.00 mg/dL 2.53  6.64  4.03   Sodium 135 - 145 mmol/L 141  144  144   Potassium 3.5 - 5.1 mmol/L 3.5  3.6  3.6   Chloride 98 - 111 mmol/L 108  107  106   CO2 22 - 32 mmol/L 24   24   Calcium 8.9 - 10.3 mg/dL 8.7   8.9   Total Protein 6.5  - 8.1 g/dL 6.4   6.4   Total Bilirubin 0.0 - 1.2 mg/dL 0.8   1.1   Alkaline Phos 38 - 126 U/L 77   59   AST 15 - 41 U/L 35   19   ALT 0 - 44 U/L 36   17    BNP    Component Value Date/Time   BNP 452.9 (H) 07/05/2023 1841   FOBT - negative  Troponin - 10  DG Chest 2 View Result Date: 07/05/2023 CLINICAL DATA:  Dyspnea EXAM: CHEST - 2 VIEW COMPARISON:  05/17/2023 FINDINGS: Small bilateral pleural effusions have developed. Lungs are clear. No pneumothorax. Median sternotomy has been performed. Cardiac size within normal limits. No acute bone abnormality. IMPRESSION: 1. Small bilateral pleural effusions. Electronically Signed   By: Helyn Numbers M.D.   On: 07/05/2023 19:59    Assessment and Plan: No notes have been filed under this hospital service. Service: Hospitalist  Acute on chronic diastolic heart failure Moderate mitral valve regurgitation Moderate aortic valve regurgitation Patient presenting with dyspnea on exertion and worsening lower extremity swelling.  Echocardiogram last month showing LVEF 50 to 55%, low normal LV function, no regional wall motion abnormalities, indeterminate diastolic parameters, moderately dilated LA and RA, moderate MVR, moderate AVR.  BNP elevated at 453.  Chest x-ray showing bilateral small pleural effusions.  She is volume overloaded on exam.  She reports taking Lopressor 25 mg twice daily and Lasix 40 mg daily at home. -IV Lasix 40 mg daily -Given adherence with home Lopressor, will resume -Strict I/O's -Daily weights -SCDs for DVT prophylaxis -PT/OT eval -Telemetry -Place in observation/telemetry  Acute on chronic anemia Concern for GI bleed Patient reporting 2-week history of dark stools.  Her baseline hemoglobin level is around 11.5.  She was noted to have a hemoglobin level of 10.7 at her primary care doctors office earlier today.  Hemoglobin 9.6 on CBC in the ED.  Unclear as to why there is a 1 point discrepancy, possible lab error.  Her  FOBT is negative in the ED and thus lower suspicion for active GI bleed at this time.  However given that her hemoglobin level is lower than her baseline, will hold her Eliquis tonight and trend hemoglobin curve.  Given negative FOBT, will hold off on GI consult at this time.  Will check iron studies, folate, B12 levels. -Holding home Eliquis -Trend hemoglobin curve, transfuse if hemoglobin less than 7 and/or significant active bleeding -Follow-up H&H tonight -Type and screen -PO protonix 40mg  BID -f/u iron studies, folate, B12 -Can resume Eliquis tomorrow morning if hemoglobin remains stable  AKI on CKD 3B Baseline creatinine around 1.3.  On admission, creatinine 1.63.  I suspect that her AKI is likely due to cardiorenal syndrome given significant volume overload on exam.  Will check renal ultrasound.  Anticipate improvement in kidney function with IV diuresis. -Follow-up renal ultrasound -IV diuresis as noted above -Monitor urine output -Avoid nephrotoxic medications as able -Trend kidney function  Paroxysmal atrial fibrillation -Currently rate controlled -Resume home Dron at around 400 mg twice daily -Resume home Lopressor 25 mg twice daily -Holding home Eliquis given concern for possible GI bleed, can resume tomorrow morning if hemoglobin remains stable -telemetry  Hypertension Blood pressure 121/71 in the ED today.  Patient reports taking Lopressor 25 mg twice daily and triamterene-hydrochlorothiazide 37.5-25 mg daily at home.  Will hold home triamterene-hydrochlorothiazide given AKI. -Resume home Lopressor -Holding home triamterene-hydrochlorothiazide  Hyperglycemia -no history of diabetes per chart review and not on any medications for this -blood glucose 191 on CMP today -f/u A1c -trend CBGs, goal 140-180 -consider adding SSI if CBGs remain above goal  Chronic cough Follows pulmonology in the outpatient setting. Trial of outpatient antibiotics did not improve symptoms.  She was started on trial of Trelegy inhaler.  Chest x-ray without any evidence of focal consolidation/pneumonia.  She is saturating well on room air. -Resume Breo Ellipta and Incruse Ellipta in place of home Trelegy -Management in the outpatient setting  Severe OSA -Resume home BiPAP nightly  CAD status post CABG Hyperlipidemia -Resume home Lipitor 80 mg daily and Zetia 10 mg daily -Holding home Eliquis given concern for possible GI bleed as noted above   Advance Care Planning:   Code Status: Full Code   Consults: None  Family Communication: no family at bedside  Severity of Illness: The appropriate patient status for this patient is OBSERVATION. Observation status is judged to be reasonable and necessary in order to provide the required intensity of service to ensure the patient's safety. The patient's presenting symptoms, physical exam findings, and initial radiographic and laboratory data in the context of their medical condition is felt to place them at decreased risk for further clinical deterioration. Furthermore, it is anticipated that the patient will be medically stable for discharge from the hospital within 2 midnights of admission.   Portions of this note were generated with Scientist, clinical (histocompatibility and immunogenetics). Dictation errors may occur despite best attempts at proofreading.  Author: Briscoe Burns, MD 07/05/2023 9:00 PM  For on call review [email or web address].

## 2023-07-05 NOTE — Telephone Encounter (Signed)
 Dr. Isaiah Serge, Please advise.  Thank you.

## 2023-07-06 ENCOUNTER — Encounter (HOSPITAL_COMMUNITY): Payer: Self-pay | Admitting: Family Medicine

## 2023-07-06 ENCOUNTER — Observation Stay (HOSPITAL_COMMUNITY)

## 2023-07-06 DIAGNOSIS — R609 Edema, unspecified: Secondary | ICD-10-CM

## 2023-07-06 DIAGNOSIS — R053 Chronic cough: Secondary | ICD-10-CM | POA: Diagnosis present

## 2023-07-06 DIAGNOSIS — I5033 Acute on chronic diastolic (congestive) heart failure: Secondary | ICD-10-CM | POA: Diagnosis present

## 2023-07-06 DIAGNOSIS — G4733 Obstructive sleep apnea (adult) (pediatric): Secondary | ICD-10-CM | POA: Diagnosis present

## 2023-07-06 DIAGNOSIS — Z833 Family history of diabetes mellitus: Secondary | ICD-10-CM | POA: Diagnosis not present

## 2023-07-06 DIAGNOSIS — N179 Acute kidney failure, unspecified: Secondary | ICD-10-CM | POA: Diagnosis present

## 2023-07-06 DIAGNOSIS — R0602 Shortness of breath: Secondary | ICD-10-CM | POA: Diagnosis present

## 2023-07-06 DIAGNOSIS — E1122 Type 2 diabetes mellitus with diabetic chronic kidney disease: Secondary | ICD-10-CM | POA: Diagnosis present

## 2023-07-06 DIAGNOSIS — D509 Iron deficiency anemia, unspecified: Secondary | ICD-10-CM | POA: Diagnosis present

## 2023-07-06 DIAGNOSIS — Z951 Presence of aortocoronary bypass graft: Secondary | ICD-10-CM | POA: Diagnosis not present

## 2023-07-06 DIAGNOSIS — I480 Paroxysmal atrial fibrillation: Secondary | ICD-10-CM | POA: Diagnosis present

## 2023-07-06 DIAGNOSIS — E1165 Type 2 diabetes mellitus with hyperglycemia: Secondary | ICD-10-CM | POA: Diagnosis present

## 2023-07-06 DIAGNOSIS — I080 Rheumatic disorders of both mitral and aortic valves: Secondary | ICD-10-CM | POA: Diagnosis present

## 2023-07-06 DIAGNOSIS — N1832 Chronic kidney disease, stage 3b: Secondary | ICD-10-CM | POA: Diagnosis present

## 2023-07-06 DIAGNOSIS — I493 Ventricular premature depolarization: Secondary | ICD-10-CM | POA: Diagnosis present

## 2023-07-06 DIAGNOSIS — M810 Age-related osteoporosis without current pathological fracture: Secondary | ICD-10-CM | POA: Diagnosis present

## 2023-07-06 DIAGNOSIS — E785 Hyperlipidemia, unspecified: Secondary | ICD-10-CM | POA: Diagnosis present

## 2023-07-06 DIAGNOSIS — I130 Hypertensive heart and chronic kidney disease with heart failure and stage 1 through stage 4 chronic kidney disease, or unspecified chronic kidney disease: Secondary | ICD-10-CM | POA: Diagnosis present

## 2023-07-06 DIAGNOSIS — I2510 Atherosclerotic heart disease of native coronary artery without angina pectoris: Secondary | ICD-10-CM | POA: Diagnosis present

## 2023-07-06 DIAGNOSIS — Z79899 Other long term (current) drug therapy: Secondary | ICD-10-CM | POA: Diagnosis not present

## 2023-07-06 DIAGNOSIS — Z7901 Long term (current) use of anticoagulants: Secondary | ICD-10-CM | POA: Diagnosis not present

## 2023-07-06 DIAGNOSIS — Z7984 Long term (current) use of oral hypoglycemic drugs: Secondary | ICD-10-CM | POA: Diagnosis not present

## 2023-07-06 DIAGNOSIS — I509 Heart failure, unspecified: Secondary | ICD-10-CM

## 2023-07-06 DIAGNOSIS — Z87891 Personal history of nicotine dependence: Secondary | ICD-10-CM | POA: Diagnosis not present

## 2023-07-06 DIAGNOSIS — I5082 Biventricular heart failure: Secondary | ICD-10-CM | POA: Diagnosis present

## 2023-07-06 DIAGNOSIS — Z923 Personal history of irradiation: Secondary | ICD-10-CM | POA: Diagnosis not present

## 2023-07-06 DIAGNOSIS — E538 Deficiency of other specified B group vitamins: Secondary | ICD-10-CM | POA: Diagnosis present

## 2023-07-06 LAB — CBC
HCT: 34.2 % — ABNORMAL LOW (ref 36.0–46.0)
Hemoglobin: 10.3 g/dL — ABNORMAL LOW (ref 12.0–15.0)
MCH: 25.2 pg — ABNORMAL LOW (ref 26.0–34.0)
MCHC: 30.1 g/dL (ref 30.0–36.0)
MCV: 83.6 fL (ref 80.0–100.0)
Platelets: 239 10*3/uL (ref 150–400)
RBC: 4.09 MIL/uL (ref 3.87–5.11)
RDW: 18.3 % — ABNORMAL HIGH (ref 11.5–15.5)
WBC: 6.3 10*3/uL (ref 4.0–10.5)
nRBC: 0 % (ref 0.0–0.2)

## 2023-07-06 LAB — URINALYSIS, ROUTINE W REFLEX MICROSCOPIC
Bilirubin Urine: NEGATIVE
Glucose, UA: NEGATIVE mg/dL
Hgb urine dipstick: NEGATIVE
Ketones, ur: NEGATIVE mg/dL
Leukocytes,Ua: NEGATIVE
Nitrite: NEGATIVE
Protein, ur: NEGATIVE mg/dL
Specific Gravity, Urine: 1.012 (ref 1.005–1.030)
pH: 5 (ref 5.0–8.0)

## 2023-07-06 LAB — CBG MONITORING, ED
Glucose-Capillary: 117 mg/dL — ABNORMAL HIGH (ref 70–99)
Glucose-Capillary: 150 mg/dL — ABNORMAL HIGH (ref 70–99)

## 2023-07-06 LAB — GLUCOSE, CAPILLARY
Glucose-Capillary: 99 mg/dL (ref 70–99)
Glucose-Capillary: 203 mg/dL — ABNORMAL HIGH (ref 70–99)

## 2023-07-06 LAB — BASIC METABOLIC PANEL WITH GFR
Anion gap: 9 (ref 5–15)
BUN: 30 mg/dL — ABNORMAL HIGH (ref 8–23)
CO2: 25 mmol/L (ref 22–32)
Calcium: 8.8 mg/dL — ABNORMAL LOW (ref 8.9–10.3)
Chloride: 108 mmol/L (ref 98–111)
Creatinine, Ser: 1.55 mg/dL — ABNORMAL HIGH (ref 0.44–1.00)
GFR, Estimated: 34 mL/min — ABNORMAL LOW (ref >60)
Glucose, Bld: 133 mg/dL — ABNORMAL HIGH (ref 70–99)
Potassium: 3.7 mmol/L (ref 3.5–5.1)
Sodium: 142 mmol/L (ref 135–145)

## 2023-07-06 LAB — IRON AND TIBC
Iron: 26 ug/dL — ABNORMAL LOW (ref 28–170)
Saturation Ratios: 5 % — ABNORMAL LOW (ref 10.4–31.8)
TIBC: 494 ug/dL — ABNORMAL HIGH (ref 250–450)
UIBC: 468 ug/dL

## 2023-07-06 LAB — FERRITIN: Ferritin: 12 ng/mL (ref 11–307)

## 2023-07-06 LAB — HEMOGLOBIN A1C
Hgb A1c MFr Bld: 7.1 % — ABNORMAL HIGH (ref 4.8–5.6)
Mean Plasma Glucose: 157.07 mg/dL

## 2023-07-06 LAB — VITAMIN B12: Vitamin B-12: 279 pg/mL (ref 180–914)

## 2023-07-06 LAB — FOLATE: Folate: 10.6 ng/mL (ref >5.9)

## 2023-07-06 MED ORDER — VITAMIN B-12 1000 MCG PO TABS
1000.0000 ug | ORAL_TABLET | Freq: Every day | ORAL | Status: DC
  Administered 2023-07-06 – 2023-07-07 (×2): 1000 ug via ORAL
  Filled 2023-07-06 (×2): qty 1

## 2023-07-06 MED ORDER — LOTILANER 0.25 % OP SOLN: 1.0000 [drp] | Freq: Two times a day (BID) | OPHTHALMIC | Status: DC

## 2023-07-06 MED ORDER — APIXABAN 2.5 MG PO TABS
2.5000 mg | ORAL_TABLET | Freq: Two times a day (BID) | ORAL | Status: DC
  Administered 2023-07-06 – 2023-07-07 (×3): 2.5 mg via ORAL
  Filled 2023-07-06 (×3): qty 1

## 2023-07-06 MED ORDER — SODIUM CHLORIDE 0.9 % IV SOLN
100.0000 mg | Freq: Once | INTRAVENOUS | Status: AC
  Administered 2023-07-06: 100 mg via INTRAVENOUS
  Filled 2023-07-06: qty 100

## 2023-07-06 MED ORDER — FUROSEMIDE 10 MG/ML IJ SOLN
40.0000 mg | Freq: Two times a day (BID) | INTRAMUSCULAR | Status: DC
  Administered 2023-07-06 – 2023-07-07 (×3): 40 mg via INTRAVENOUS
  Filled 2023-07-06 (×3): qty 4

## 2023-07-06 MED ORDER — APIXABAN 5 MG PO TABS: 5.0000 mg | ORAL_TABLET | Freq: Two times a day (BID) | ORAL | Status: DC

## 2023-07-06 NOTE — Inpatient Diabetes Management (Signed)
 Inpatient Diabetes Program Recommendations  AACE/ADA: New Consensus Statement on Inpatient Glycemic Control (2015)  Target Ranges:  Prepandial:   less than 140 mg/dL      Peak postprandial:   less than 180 mg/dL (1-2 hours)      Critically ill patients:  140 - 180 mg/dL   Lab Results  Component Value Date   GLUCAP 150 (H) 07/06/2023   HGBA1C 7.1 (H) 07/05/2023    Review of Glycemic Control  Latest Reference Range & Units 07/06/23 08:14 07/06/23 12:28  Glucose-Capillary 70 - 99 mg/dL 347 (H) 425 (H)  (H): Data is abnormally high Diabetes history: New onset DM Outpatient Diabetes medications: none Current orders for Inpatient glycemic control: CBGs TID & HS  Inpatient Diabetes Program Recommendations:    Spoke with patient regarding new onset DM. Patient is followed by PCP and DM has previously been discussed.  Reviewed patient's current A1c of 7.1%. Explained what a A1c is and what it measures. Also reviewed goal A1c with patient, importance of good glucose control @ home, and blood sugar goals. Reviewed basic patho of DM, survival skills, interventions, signs and symptoms, when to contact MD, vascular changes and commorbidities.  Patient has a meter, but inquires about CGM. MD provided order to apply CGM on 3/28. Discussed application, risks vs benefits, ADA standards on application, how to utilize as a tool and removal.  Reviewed importance of continued mindfulness of CHO intake and importance of protein. No further questions at this time.   Thanks, Lujean Rave, MSN, RNC-OB Diabetes Coordinator [phone] (8a-5p)

## 2023-07-06 NOTE — Progress Notes (Signed)
   07/06/23 2322  BiPAP/CPAP/SIPAP  BiPAP/CPAP/SIPAP Pt Type Adult  Reason BIPAP/CPAP not in use Non-compliant

## 2023-07-06 NOTE — Progress Notes (Signed)
 Heart Failure Navigator Progress Note  Assessed for Heart & Vascular TOC clinic readiness.  Patient does not meet criteria due to EF 50-55%, has a scheduled [facility] appointment on 08/01/2023. .   Navigator will sign of fat this time.    Rhae Hammock, BSN, Scientist, clinical (histocompatibility and immunogenetics) Only

## 2023-07-06 NOTE — Progress Notes (Signed)
   07/06/23 0019  BiPAP/CPAP/SIPAP  BiPAP/CPAP/SIPAP Pt Type Adult  Reason BIPAP/CPAP not in use Non-compliant (Patient refused BiPAP qhs.  She states that she is unable to tolerate wearing the mask due to it making her feel claustrophobic.  States that she prefers wearing her nasal cannula while sleeping tonight.)

## 2023-07-06 NOTE — Progress Notes (Signed)
 BLE venous duplex has been completed.   Results can be found under chart review under CV PROC. 07/06/2023 12:50 PM Moyses Pavey RVT, RDMS

## 2023-07-06 NOTE — TOC Initial Note (Signed)
 Transition of Care [facility]) - Initial/Assessment Note    Patient Details  Name: Mallory Watts MRN: [id number] Date of Birth: [birth year]  [facility]) CM/SW Contact:    Lanier Clam, RN Phone Number: 07/06/2023, 3:53 PM  Clinical Narrative: PT recc otpt PT-referral placed for otpt PT. Continue to monitor for d/c plans.                  Expected Discharge Plan: OP Rehab Barriers to Discharge: Continued Medical Work up   Patient Goals and CMS Choice Patient states their goals for this hospitalization and ongoing recovery are:: Home CMS Medicare.gov Compare Post Acute Care list provided to:: Patient Choice offered to / list presented to : Patient [place] ownership interest in [facility].provided to:: Patient    Expected Discharge Plan and Services                                              Prior Living Arrangements/Services                       Activities of Daily Living   ADL Screening (condition at time of admission) Independently performs ADLs?: Yes (appropriate for developmental age) Is the patient deaf or have difficulty hearing?: No Does the patient have difficulty seeing, even when wearing glasses/contacts?: No Does the patient have difficulty concentrating, remembering, or making decisions?: No  Permission Sought/Granted                  Emotional Assessment              Admission diagnosis:  Acute on chronic diastolic heart failure (HCC) [I50.33] Heart failure (HCC) [I50.9] Acute on chronic congestive heart failure, unspecified heart failure type [facility]) [I50.9] Patient Active Problem List   Diagnosis Date Noted   Heart failure (HCC) 07/06/2023   Acute on chronic diastolic heart failure (HCC) 07/05/2023   PAF (paroxysmal atrial fibrillation) (HCC) 12/13/2022   Nonrheumatic aortic valve insufficiency 12/13/2022   Severe obstructive sleep apnea 08/24/2021   CHF (congestive heart failure) (HCC)  08/24/2021   DOE (dyspnea on exertion) 08/24/2021   Chronic cough 08/24/2021   History of left breast cancer 04/19/2021   Status post coronary artery bypass graft 03/30/2018   Coronary artery disease 03/30/2018   Paroxysmal atrial flutter (HCC) 03/30/2018   Dyslipidemia 03/30/2018   OA (osteoarthritis) of knee 11/28/2016   Cervical spondylosis with myelopathy and radiculopathy 01/14/2014   RECTAL BLEEDING 01/07/2009   History of colonic polyps 01/07/2009   GASTROESOPHAGEAL REFLUX DISEASE, HX OF 08/01/2007   ESOPHAGEAL STRICTURE 05/19/2005   ESOPHAGEAL REFLUX 05/19/2005   Acute gastritis 05/19/2005   ADENOMATOUS COLONIC POLYP 05/09/2005   PCP:  Creola Corn, MD Pharmacy:   [facility] [phone] - [place], [place] - [address] AT [address] OF [facility] [address] [place] [address] Phone: [phone] Fax: [phone]     Social Drivers of Health (SDOH) Social History: SDOH Screenings   Food Insecurity: No Food Insecurity (07/06/2023)  Housing: Low Risk  (07/06/2023)  Transportation Needs: No Transportation Needs (07/06/2023)  Utilities: Not At Risk (07/06/2023)  Social Connections: Moderately Integrated (07/06/2023)  Tobacco Use: Medium Risk (07/06/2023)   SDOH Interventions:     Readmission Risk Interventions     No data to display

## 2023-07-06 NOTE — Evaluation (Signed)
 Physical Therapy Evaluation Patient Details Name: Mallory Watts MRN: [id number] DOB: 1940-07-01 Today's Date: 07/06/2023  History of Present Illness  Mallory Watts is a 83 y.o. female presents to Ed 07/05/23 with  complaints of SOB, LE edema, dark stools .Chest x-ray showing small bilateral pleural effusions. PMH: chronic diastolic heart failure, chronic cough, paroxysmal atrial fibrillation, CAD status post CABG, OSA, GERD, hyperlipidemia.  Clinical Impression  Pt admitted with above diagnosis.  Pt currently with functional limitations due to the deficits listed below (see PT Problem List). Pt will benefit from acute skilled PT to increase their independence and safety with mobility to allow discharge.     The patient received seated on stretcher. Patient  reports independence PTA. Patient   ambulated x 200' with CGA, since patient reports history of syncope and falls.  Patient recently began OPPT and is interested in resuming after DC.   BP sitting 132/75, after ambulation 142/82. Spo2 >95% HR 83-85   Continue mobility, monitor BP.      If plan is discharge home, recommend the following: Assistance with cooking/housework;Assist for transportation;Help with stairs or ramp for entrance;A lot of help with bathing/dressing/bathroom   Can travel by private vehicle        Equipment Recommendations None recommended by PT  Recommendations for Other Services       Functional Status Assessment Patient has had a recent decline in their functional status and demonstrates the ability to make significant improvements in function in a reasonable and predictable amount of time.     Precautions / Restrictions Precautions Precautions: Fall Precaution/Restrictions Comments: monitor BP Restrictions Weight Bearing Restrictions Per Provider Order: No      Mobility  Bed Mobility Overal bed mobility: Modified Independent                  Transfers Overall transfer level: Needs  assistance Equipment used: None Transfers: Sit to/from Stand Sit to Stand: Supervision                Ambulation/Gait Ambulation/Gait assistance: Contact guard assist Gait Distance (Feet): 200 Feet Assistive device: None Gait Pattern/deviations: Step-through pattern Gait velocity: decr     General Gait Details: gait slow and steady  Stairs            Wheelchair Mobility     Tilt Bed    Modified Rankin (Stroke Patients Only)       Balance Overall balance assessment: Needs assistance, History of Falls Sitting-balance support: Feet supported, No upper extremity supported Sitting balance-Leahy Scale: Good     Standing balance support: During functional activity, No upper extremity supported Standing balance-Leahy Scale: Good                               Pertinent Vitals/Pain Pain Assessment Pain Score: 6  Pain Location: headache Pain Descriptors / Indicators: Headache Pain Intervention(s): Monitored during session, Premedicated before session    Home Living Family/patient expects to be discharged to:: Private residence Living Arrangements: Spouse/significant other Available Help at Discharge: Family;Available 24 hours/day (Unless taking a trip. But will be available while pt recovering.) Type of Home: House Home Access: Stairs to enter Entrance Stairs-Rails: Can reach both;Left;Right Entrance Stairs-Number of Steps: 5 Alternate Level Stairs-Number of Steps: 10-14 to basement. 10-14 to 3rd floor bedrooms. Home Layout: Multi-level;Full bath on main level;Able to live on main level with bedroom/bathroom Home Equipment: Grab bars - tub/shower;Grab bars - toilet;Cane - single  point;Shower seat;Hand held Stage manager (4 wheels) (Has Upwalker)      Prior Function Prior Level of Function : Independent/Modified Independent;Driving       Physical Assist : ADLs (physical)   ADLs (physical): IADLs Mobility Comments: Usually walks  without AD but uses her SPC PRN. ADLs Comments: Has housekeeping every 2 weeks including laundry. Pt cooks, drives. Pt's S.O, Sharma Covert does the grocery shopping.     Extremity/Trunk Assessment   Upper Extremity Assessment Upper Extremity Assessment: Overall WFL for tasks assessed    Lower Extremity Assessment Lower Extremity Assessment: Overall WFL for tasks assessed    Cervical / Trunk Assessment Cervical / Trunk Assessment: Normal  Communication   Communication Communication: No apparent difficulties    Cognition Arousal: Alert Behavior During Therapy: WFL for tasks assessed/performed   PT - Cognitive impairments: No apparent impairments                                 Cueing       General Comments      Exercises     Assessment/Plan    PT Assessment Patient needs continued PT services  PT Problem List Decreased strength;Decreased activity tolerance;Decreased mobility;Cardiopulmonary status limiting activity       PT Treatment Interventions DME instruction;Gait training;Functional mobility training;Therapeutic activities;Patient/family education    PT Goals (Current goals can be found in the Care Plan section)  Acute Rehab PT Goals Patient Stated Goal: go home, get meds right PT Goal Formulation: With patient/family Time For Goal Achievement: 07/20/23 Potential to Achieve Goals: Good    Frequency Min 2X/week     Co-evaluation PT/OT/SLP Co-Evaluation/Treatment: Yes Reason for Co-Treatment: For patient/therapist safety PT goals addressed during session: Mobility/safety with mobility OT goals addressed during session: ADL's and self-care       AM-PAC PT "6 Clicks" Mobility  Outcome Measure Help needed turning from your back to your side while in a flat bed without using bedrails?: None Help needed moving from lying on your back to sitting on the side of a flat bed without using bedrails?: None Help needed moving to and from a bed to a chair  (including a wheelchair)?: None Help needed standing up from a chair using your arms (e.g., wheelchair or bedside chair)?: A Little Help needed to walk in hospital room?: A Little Help needed climbing 3-5 steps with a railing? : A Little 6 Click Score: 21    End of Session Equipment Utilized During Treatment: Gait belt Activity Tolerance: Patient tolerated treatment well Patient left: in bed;with call bell/phone within reach;with family/visitor present Nurse Communication: Mobility status PT Visit Diagnosis: Unsteadiness on feet (R26.81);History of falling (Z91.81);Muscle weakness (generalized) (M62.81)    Time: 9562-1308 PT Time Calculation (min) (ACUTE ONLY): 34 min   Charges:   PT Evaluation $PT Eval Low Complexity: 1 Low   PT General Charges $$ ACUTE PT VISIT: 1 Visit         Blanchard Kelch PT Acute Rehabilitation Services Office [phone]    Rada Hay 07/06/2023, 11:13 AM

## 2023-07-06 NOTE — Progress Notes (Signed)
 PROGRESS NOTE    Mallory Watts  NWG:[id number] DOB: 02/20/1941 DOA: 07/05/2023 PCP: Creola Corn, MD  Chief Complaint  Patient presents with   Shortness of Breath   Leg Swelling    Brief Narrative:    83 y.o. female with medical history significant of chronic diastolic heart failure, chronic cough, paroxysmal atrial fibrillation, CAD status post CABG, OSA, GERD, hyperlipidemia who presents to the ED with multiple complaints.  She was admitted for dyspnea on exertion and chronic cough.  Being treated for heart failure exacerbation.   Also, concern for dark stools, admitted for concern for possible GI bleeding.     Assessment & Plan:   Principal Problem:   Acute on chronic diastolic heart failure (HCC)  Acute on Chronic Diastolic Heart Failure Volume Overload  Small Bilateral Pleural Effusions  Dyspnea on Exertion Moderate MV Regurgitation Moderate AV Regurgitation Significant bilateral LE edema on exam, DOE.  CXR with small bilateral effusions.  She notes 20 lb weight gain since Dec (though this is not documented objectively in our records).  Follow UA for proteinuria.  LFT's appropriate, albumin 3.4.  Recent echo with low normal EF, indeterminate diastolic parameters, low normal RVSF.  Moderate MV regurg.  Moderate AV regurg. At home on 40 mg lasix PO daily -> IV lasix 40 mg PO BID while admitted Strict I/O, daily weights  Iron Deficiency Anemia  Dark Stools  Baseline Hb appears to be ~11-12 Presented with Hb ~9-10  Reports intermittent dark stools at home (not tarry or BRBPR), she describes as "feathery" and "black" With stable hb and negative FOBT, low suspicion for hemodynamically significant GI bleed Continue PPI for now Will resume eliquis (reduce dose to 2.5 mg BID based on age and creatinine) and monitor for now ] Labs suggestive of iron def anemia -> IV iron Low normal B12, follow MMA, supplement b12  Consult GI if evidence of GI bleed here or downtrend in  Hb, otherwise, would recommend outpatient GI follow up   AKI on CKD IIIb Baseline creatinine is about 1.1-1.3 Presented with creatinine of 1.6 - suspect this is prerenal due to volume overload UA pending Renal US without hydro   Atrial Fibrillation Metoprolol, multaq Eliquis (dose reduced based on age, creatinine as noted above) - watch Hb with report of dark stool  Hyperglycemia  T2DM New dx, A1c 7.1  BG's ok this AM, will hold off on SSI for now - follow BG's on AM BMP's Diabetes coordinator for education - consider metformin at discharge based on renal function vs other meds  Chronic Cough Undergoing trial of trelegy (using breo, incruse here) per pulmonology (05/16/2023 note)  Hypertension Continue metoprolol   Osteoporosis Fosamax weekly   OSA Nightly bipap  CAD s/p CABG Dyslipidemia Lipitor, zetia    DVT prophylaxis: eliquis Code Status: full Family Communication: none Disposition:   Status is: Observation The patient remains OBS appropriate and will d/c before 2 midnights.   Consultants:  none  Procedures:  Echo 05/23/2023 IMPRESSIONS     1. Left ventricular ejection fraction, by estimation, is 50 to 55%. The  left ventricle has low normal function. The left ventricle has no regional  wall motion abnormalities. Left ventricular diastolic parameters are  indeterminate.   2. Right ventricular systolic function is low normal. The right  ventricular size is mildly enlarged. There is normal pulmonary artery  systolic pressure. The estimated right ventricular systolic pressure is  31.1 mmHg.   3. Left atrial size was moderately dilated.  4. Right atrial size was moderately dilated.   5. The mitral valve is degenerative. Moderate mitral valve regurgitation.  Moderate mitral annular calcification.   6. Tricuspid valve regurgitation is moderate.   7. The aortic valve was not well visualized. Aortic valve regurgitation  is moderate. Aortic regurgitation  PHT measures 350 msec.   8. There is moderate dilatation of the ascending aorta, measuring 39 mm.   9. The inferior vena cava is normal in size with greater than 50%  respiratory variability, suggesting right atrial pressure of 3 mmHg.   Antimicrobials:  Anti-infectives (From admission, onward)    None       Subjective: C/o DOE, LE edema since Dec   Objective: Vitals:   07/05/23 2245 07/06/23 0147 07/06/23 0600 07/06/23 0700  BP: (!) 152/98 (!) 146/116 (!) 153/106 (!) 162/108  Pulse: 79 74 78 79  Resp: (!) 21 18 14  (!) 22  Temp: 98 F (36.7 C) 98.6 F (37 C) 98.4 F (36.9 C)   TempSrc: Oral Oral Oral   SpO2: 96% 99% 94% 91%   No intake or output data in the 24 hours ending 07/06/23 0830 There were no vitals filed for this visit.  Examination:  General exam: Appears calm and comfortable - lying in bed with head of bed elevated Respiratory system: Clear to auscultation. No clearly adventitious lung sounds appreciated. Cardiovascular system: RRR Gastrointestinal system: protuberant, no ttp, soft Central nervous system: Alert and oriented. No focal neurological deficits. Extremities: significant 2+ bilateral LE edema   Data Reviewed: I have personally reviewed following labs and imaging studies  CBC: Recent Labs  Lab 07/05/23 1841 07/05/23 2211 07/06/23 0416  WBC 6.1  --  6.3  NEUTROABS 3.9  --   --   HGB 9.6* 10.3* 10.3*  HCT 32.2* 36.7 34.2*  MCV 84.5  --  83.6  PLT 241  --  239    Basic Metabolic Panel: Recent Labs  Lab 07/05/23 1841 07/06/23 0416  NA 141 142  K 3.5 3.7  CL 108 108  CO2 24 25  GLUCOSE 191* 133*  BUN 30* 30*  CREATININE 1.63* 1.55*  CALCIUM 8.7* 8.8*    GFR: CrCl cannot be calculated (Unknown ideal weight.).  Liver Function Tests: Recent Labs  Lab 07/05/23 1841  AST 35  ALT 36  ALKPHOS 77  BILITOT 0.8  PROT 6.4*  ALBUMIN 3.4*    CBG: Recent Labs  Lab 07/05/23 2159 07/06/23 0814  GLUCAP 96 117*     No  results found for this or any previous visit (from the past 240 hours).       Radiology Studies: US RENAL Result Date: 07/05/2023 CLINICAL DATA:  Acute kidney injury. EXAM: RENAL / URINARY TRACT ULTRASOUND COMPLETE COMPARISON:  None Available. FINDINGS: Right Kidney: Renal measurements: 10.8 x 4.9 x 5.8 cm = volume: 161 mL. Echogenicity within normal limits. No mass or hydronephrosis visualized. Left Kidney: Renal measurements: 11 x 4.7 x 4.8 cm = volume: 129 mL. Echogenicity within normal limits. No mass or hydronephrosis visualized. Bladder: Appears normal for degree of bladder distention. Other: Incidental finding of Arma Reining left pleural effusion. IMPRESSION: Normal ultrasound appearance of the kidneys. No mass or hydronephrosis. Left pleural effusion is incidentally demonstrated. Electronically Signed   By: Burman Nieves M.D.   On: 07/05/2023 22:09   DG Chest 2 View Result Date: 07/05/2023 CLINICAL DATA:  Dyspnea EXAM: CHEST - 2 VIEW COMPARISON:  05/17/2023 FINDINGS: Small bilateral pleural effusions have developed. Lungs are clear.  No pneumothorax. Median sternotomy has been performed. Cardiac size within normal limits. No acute bone abnormality. IMPRESSION: 1. Small bilateral pleural effusions. Electronically Signed   By: Helyn Numbers M.D.   On: 07/05/2023 19:59        Scheduled Meds:  atorvastatin  80 mg Oral Daily   vitamin B-12  1,000 mcg Oral Daily   dronedarone  400 mg Oral BID WC   ezetimibe  10 mg Oral QHS   fluticasone furoate-vilanterol  1 puff Inhalation Daily   And   umeclidinium bromide  1 puff Inhalation Daily   furosemide  40 mg Intravenous BID   metoprolol succinate  25 mg Oral BID   pantoprazole  40 mg Oral BID   Continuous Infusions:  iron sucrose       LOS: 0 days    Time spent: over 30 min    Lacretia Nicks, MD [facility]   To contact the attending provider between 7A-7P or the covering provider during after hours 7P-7A, please log into  the web site [email or web address] and access using universal [place] password for that web site. If you do not have the password, please call the hospital operator.  07/06/2023, 8:30 AM

## 2023-07-06 NOTE — Evaluation (Signed)
 Occupational Therapy Evaluation Patient Details Name: Mallory Watts MRN: [id number] DOB: 1940/05/16 Today's Date: 07/06/2023   History of Present Illness   Mallory Watts is a 83 y.o. female presents to Ed 07/05/23 with  complaints of SOB, LE edema, dark stools .Chest x-ray showing small bilateral pleural effusions. PMH: chronic diastolic heart failure, chronic cough, paroxysmal atrial fibrillation, CAD status post CABG, OSA, GERD, hyperlipidemia.     Clinical Impressions Patient is currently requiring overall stand by assistance with basic ADLs, as well as stand by assist with functional transfers including hallway ambulation with use of no AD.   Current level of function is below patient's typical baseline.    During this evaluation, patient was limited by lightheadedness with BP changes during changes in position, impaired activity tolerance, and LE edema, all of which has the potential to impact patient's and/or caregivers' safety and independence during functional mobility, as well as performance for ADLs.    Patient lives with her SO who  able to provide 24/7 supervision and assistance while pt recovers.  Patient demonstrates good rehab potential, and should benefit from continued skilled occupational therapy services while in acute care to maximize safety, independence and quality of life at home.  Continued occupational therapy services are recommended. Do not anticipate post- acute OT needs. Defer to outpatient PT.   BP sitting 132/75, after ambulation 142/82. Spo2 >95% HR 83-85   Continue mobility, monitor BP.  ?      If plan is discharge home, recommend the following:   A little help with walking and/or transfers;A little help with bathing/dressing/bathroom     Functional Status Assessment   Patient has had a recent decline in their functional status and demonstrates the ability to make significant improvements in function in a reasonable and predictable amount of  time.     Equipment Recommendations   None recommended by OT     Recommendations for Other Services         Precautions/Restrictions   Precautions Precautions: Fall Precaution/Restrictions Comments: monitor BP Restrictions Weight Bearing Restrictions Per Provider Order: No     Mobility Bed Mobility Overal bed mobility: Modified Independent                  Transfers Overall transfer level: Needs assistance Equipment used: None Transfers: Sit to/from Stand Sit to Stand: Supervision                  Balance Overall balance assessment: Needs assistance, History of Falls Sitting-balance support: Feet supported, No upper extremity supported Sitting balance-Leahy Scale: Good     Standing balance support: During functional activity, No upper extremity supported Standing balance-Leahy Scale: Good                             ADL either performed or assessed with clinical judgement   ADL Overall ADL's : Needs assistance/impaired Eating/Feeding: Independent   Grooming: Supervision/safety;Standing   Upper Body Bathing: Sitting;Supervision/ safety;Set up   Lower Body Bathing: Contact guard assist;Sitting/lateral leans;Sit to/from stand Lower Body Bathing Details (indicate cue type and reason): Based on general assessment. Upper Body Dressing : Sitting;Set up   Lower Body Dressing: Minimal assistance;Sitting/lateral leans   Toilet Transfer: Supervision/safety;Ambulation   Toileting- Clothing Manipulation and Hygiene: Supervision/safety;Cueing for safety Toileting - Clothing Manipulation Details (indicate cue type and reason): Not observed but infer supervision to CGA. Pt currently constipated but still with lightheadedness and BP changes so advised  close supervision and to avoid valsalva pushing when trying. RN stated plan to be with pt.     Functional mobility during ADLs: Supervision/safety       Vision Baseline Vision/History: 1  Wears glasses Ability to See in Adequate Light: 0 Adequate       Perception         Praxis         Pertinent Vitals/Pain Pain Assessment Pain Assessment: 0-10 Pain Score: 6  Pain Location: headache Pain Descriptors / Indicators: Headache Pain Intervention(s): Monitored during session, Premedicated before session     Extremity/Trunk Assessment Upper Extremity Assessment Upper Extremity Assessment: Overall WFL for tasks assessed   Lower Extremity Assessment Lower Extremity Assessment:  (BLE edema)   Cervical / Trunk Assessment Cervical / Trunk Assessment: Normal   Communication Communication Communication: No apparent difficulties   Cognition Arousal: Alert Behavior During Therapy: WFL for tasks assessed/performed Cognition: No apparent impairments                               Following commands: Intact       Cueing  General Comments          Exercises     Shoulder Instructions      Home Living Family/patient expects to be discharged to:: Private residence Living Arrangements: Spouse/significant other Available Help at Discharge: Family;Available 24 hours/day (Unless taking a trip. But will be available while pt recovering.) Type of Home: House Home Access: Stairs to enter Entrance Stairs-Number of Steps: 5 Entrance Stairs-Rails: Can reach both;Left;Right Home Layout: Multi-level;Full bath on main level;Able to live on main level with bedroom/bathroom Alternate Level Stairs-Number of Steps: 10-14 to basement. 10-14 to 3rd floor bedrooms. Alternate Level Stairs-Rails: Left;Right Bathroom Shower/Tub: Chief Strategy Officer: Handicapped height     Home Equipment: Grab bars - tub/shower;Grab bars - toilet;Cane - single point;Shower seat;Hand held Stage manager (4 wheels) (Has Upwalker)          Prior Functioning/Environment Prior Level of Function : Independent/Modified Independent;Driving       Physical Assist :  ADLs (physical)   ADLs (physical): IADLs Mobility Comments: Usually walks without AD but uses her SPC PRN. ADLs Comments: Has housekeeping every 2 weeks including laundry. Pt cooks, drives. Pt's S.O, Sharma Covert does the grocery shopping.    OT Problem List: Decreased activity tolerance;Pain   OT Treatment/Interventions: Self-care/ADL training;Therapeutic activities;Therapeutic exercise;DME and/or AE instruction;Balance training;Patient/family education      OT Goals(Current goals can be found in the care plan section)   Acute Rehab OT Goals Patient Stated Goal: Tolerate being up and using bathroom without lightheadedness OT Goal Formulation: With patient/family Time For Goal Achievement: 07/20/23 Potential to Achieve Goals: Good ADL Goals Additional ADL Goal #1: Pt will engage in at least 10 min of standing functional activities without loss of standing balance, in order to demonstrate improved activity tolerance and balance needed to perform ADLs safely at home. Additional ADL Goal #2: Pt will score at least a 9 with sit <> stand reps from chair or EOB for 30 second Sit to stand test, modified for use of UEs if needed for pt safety, with VSS and no lightheadedness, to demosntrate low fall risk and improved tolerance to position changes to allow baseline functional at home.   OT Frequency:  Min 1X/week    Co-evaluation PT/OT/SLP Co-Evaluation/Treatment: Yes Reason for Co-Treatment: For patient/therapist safety PT goals addressed during session: Mobility/safety with  mobility OT goals addressed during session: ADL's and self-care      AM-PAC OT "6 Clicks" Daily Activity     Outcome Measure Help from another person eating meals?: None Help from another person taking care of personal grooming?: None Help from another person toileting, which includes using toliet, bedpan, or urinal?: A Little Help from another person bathing (including washing, rinsing, drying)?: A Little Help from  another person to put on and taking off regular upper body clothing?: A Little Help from another person to put on and taking off regular lower body clothing?: A Little 6 Click Score: 20   End of Session Equipment Utilized During Treatment: Gait belt Nurse Communication: Other (comment) (Pt need to use bathroom soon)  Activity Tolerance: Patient tolerated treatment well Patient left: in bed;with call bell/phone within reach;with family/visitor present;with nursing/sitter in room  OT Visit Diagnosis: Dizziness and giddiness (R42);Pain (decreased ADLS) Pain - Right/Left:  (head)                Time: 1610-9604 OT Time Calculation (min): 35 min Charges:  OT General Charges $OT Visit: 1 Visit OT Evaluation $OT Eval Low Complexity: 1 Low  Jamesetta Greenhalgh, OT Acute Rehab Services Office: [phone] 07/06/2023   Theodoro Clock 07/06/2023, 12:46 PM

## 2023-07-07 DIAGNOSIS — I5033 Acute on chronic diastolic (congestive) heart failure: Secondary | ICD-10-CM | POA: Diagnosis not present

## 2023-07-07 LAB — CBC WITH DIFFERENTIAL/PLATELET
Abs Immature Granulocytes: 0.02 10*3/uL (ref 0.00–0.07)
Basophils Absolute: 0 10*3/uL (ref 0.0–0.1)
Basophils Relative: 1 %
Eosinophils Absolute: 0.2 10*3/uL (ref 0.0–0.5)
Eosinophils Relative: 3 %
HCT: 34.3 % — ABNORMAL LOW (ref 36.0–46.0)
Hemoglobin: 10.1 g/dL — ABNORMAL LOW (ref 12.0–15.0)
Immature Granulocytes: 0 %
Lymphocytes Relative: 21 %
Lymphs Abs: 1.3 10*3/uL (ref 0.7–4.0)
MCH: 25.2 pg — ABNORMAL LOW (ref 26.0–34.0)
MCHC: 29.4 g/dL — ABNORMAL LOW (ref 30.0–36.0)
MCV: 85.5 fL (ref 80.0–100.0)
Monocytes Absolute: 0.8 10*3/uL (ref 0.1–1.0)
Monocytes Relative: 13 %
Neutro Abs: 3.8 10*3/uL (ref 1.7–7.7)
Neutrophils Relative %: 62 %
Platelets: 227 10*3/uL (ref 150–400)
RBC: 4.01 MIL/uL (ref 3.87–5.11)
RDW: 18.3 % — ABNORMAL HIGH (ref 11.5–15.5)
WBC: 6.1 10*3/uL (ref 4.0–10.5)
nRBC: 0 % (ref 0.0–0.2)

## 2023-07-07 LAB — COMPREHENSIVE METABOLIC PANEL WITH GFR
ALT: 28 U/L (ref 0–44)
AST: 25 U/L (ref 15–41)
Albumin: 3.2 g/dL — ABNORMAL LOW (ref 3.5–5.0)
Alkaline Phosphatase: 77 U/L (ref 38–126)
Anion gap: 10 (ref 5–15)
BUN: 26 mg/dL — ABNORMAL HIGH (ref 8–23)
CO2: 25 mmol/L (ref 22–32)
Calcium: 8.6 mg/dL — ABNORMAL LOW (ref 8.9–10.3)
Chloride: 106 mmol/L (ref 98–111)
Creatinine, Ser: 1.59 mg/dL — ABNORMAL HIGH (ref 0.44–1.00)
GFR, Estimated: 33 mL/min — ABNORMAL LOW (ref >60)
Glucose, Bld: 126 mg/dL — ABNORMAL HIGH (ref 70–99)
Potassium: 3.7 mmol/L (ref 3.5–5.1)
Sodium: 141 mmol/L (ref 135–145)
Total Bilirubin: 1 mg/dL (ref 0.0–1.2)
Total Protein: 6.3 g/dL — ABNORMAL LOW (ref 6.5–8.1)

## 2023-07-07 LAB — PHOSPHORUS: Phosphorus: 4.3 mg/dL (ref 2.5–4.6)

## 2023-07-07 LAB — MAGNESIUM: Magnesium: 1.8 mg/dL (ref 1.7–2.4)

## 2023-07-07 LAB — GLUCOSE, CAPILLARY: Glucose-Capillary: 137 mg/dL — ABNORMAL HIGH (ref 70–99)

## 2023-07-07 MED ORDER — BLOOD GLUCOSE TEST VI STRP
1.0000 | ORAL_STRIP | Freq: Three times a day (TID) | 0 refills | Status: AC
Start: 2023-07-07 — End: 2023-08-06

## 2023-07-07 MED ORDER — BLOOD GLUCOSE MONITORING SUPPL DEVI: 1.0000 | Freq: Three times a day (TID) | 0 refills | Status: DC

## 2023-07-07 MED ORDER — APIXABAN 2.5 MG PO TABS: 2.5000 mg | ORAL_TABLET | Freq: Two times a day (BID) | ORAL | 1 refills | Status: DC

## 2023-07-07 MED ORDER — PANTOPRAZOLE SODIUM 40 MG PO TBEC: 40.0000 mg | DELAYED_RELEASE_TABLET | ORAL | Status: DC

## 2023-07-07 MED ORDER — LANCETS MISC. MISC: 1.0000 | Freq: Three times a day (TID) | 0 refills | Status: AC

## 2023-07-07 MED ORDER — CYANOCOBALAMIN 1000 MCG PO TABS: 1000.0000 ug | ORAL_TABLET | Freq: Every day | ORAL | 1 refills | Status: DC

## 2023-07-07 MED ORDER — LANCET DEVICE MISC: 1.0000 | Freq: Three times a day (TID) | 0 refills | Status: AC

## 2023-07-07 MED ORDER — GUAIFENESIN ER 600 MG PO TB12: 600.0000 mg | ORAL_TABLET | Freq: Two times a day (BID) | ORAL | 0 refills | Status: AC

## 2023-07-07 MED ORDER — SITAGLIPTIN PHOSPHATE 25 MG PO TABS: 25.0000 mg | ORAL_TABLET | Freq: Every day | ORAL | 1 refills | Status: DC

## 2023-07-07 NOTE — Inpatient Diabetes Management (Signed)
 Inpatient Diabetes Program Recommendations  AACE/ADA: New Consensus Statement on Inpatient Glycemic Control (2015)  Target Ranges:  Prepandial:   less than 140 mg/dL      Peak postprandial:   less than 180 mg/dL (1-2 hours)      Critically ill patients:  140 - 180 mg/dL   Lab Results  Component Value Date   GLUCAP 137 (H) 07/07/2023   HGBA1C 7.1 (H) 07/05/2023    Review of Glycemic Control  Latest Reference Range & Units 07/06/23 16:16 07/06/23 20:02 07/07/23 07:18  Glucose-Capillary 70 - 99 mg/dL 99 161 (H) 096 (H)  (H): Data is abnormally high Diabetes history: New onset DM Outpatient Diabetes medications: none Current orders for Inpatient glycemic control: CBGs TID & HS   Inpatient Diabetes Program Recommendations:     Applied Freestyle libre to patient's right upper arm per request of patient. Reviewed application, how to interpret values, when to check with MD, target goal range, recommendations for checking CBGs per American Diabetes Association. Patient has additional questions regarding reason for admission. Discussed with RN. No additional questions regarding DM. Patient plans to follow up with Dr Timothy Lasso.   Thanks, Lujean Rave, MSN, RNC-OB Diabetes Coordinator [phone] (8a-5p)

## 2023-07-07 NOTE — Progress Notes (Signed)
Patient discharged: Home with family  Via: Wheelchair   Discharge paperwork given: to patient and family  Reviewed with teach back  IV and telemetry disconnected  Belongings given to patient    

## 2023-07-07 NOTE — Discharge Instructions (Signed)
 Check B12 level in 1 month at PCP office

## 2023-07-07 NOTE — Progress Notes (Signed)
 Mobility Specialist - Progress Note   07/07/23 0905  Mobility  Activity Ambulated independently to bathroom;Ambulated independently in hallway  Level of Assistance Standby assist, set-up cues, supervision of patient - no hands on  Assistive Device None  Distance Ambulated (ft) 200 ft  Range of Motion/Exercises Active  Activity Response Tolerated well  Mobility Referral Yes  Mobility visit 1 Mobility  Mobility Specialist Start Time (ACUTE ONLY) 0845  Mobility Specialist Stop Time (ACUTE ONLY) 0905  Mobility Specialist Time Calculation (min) (ACUTE ONLY) 20 min   Pt was found in bed and agreeable to ambulate. No complaints with session. At EOS returned to recliner chair with all needs met. Call bell in reach.  Billey Chang Mobility Specialist

## 2023-07-07 NOTE — TOC Transition Note (Signed)
 Transition of Care [facility]) - Discharge Note   Patient Details  Name: Mallory Watts MRN: [id number] Date of Birth: [birth year]  [facility] of [facility]) CM/SW Contact:  Lanier Clam, RN Phone Number: 07/07/2023, 12:08 PM   Clinical Narrative: Per prior TOC note. D/c home w/otpt PT.No further CM needs.      Final next level of care: OP Rehab Barriers to Discharge: No Barriers Identified   Patient Goals and CMS Choice Patient states their goals for this hospitalization and ongoing recovery are:: Home CMS Medicare.gov Compare Post Acute Care list provided to:: Patient Choice offered to / list presented to : Patient [place] ownership interest in [facility]provided to:: Patient    Discharge Placement                       Discharge Plan and Services Additional resources added to the After Visit Summary for                                       Social Drivers of Health (SDOH) Interventions SDOH Screenings   Food Insecurity: No Food Insecurity (07/06/2023)  Housing: Low Risk  (07/06/2023)  Transportation Needs: No Transportation Needs (07/06/2023)  Utilities: Not At Risk (07/06/2023)  Social Connections: Moderately Integrated (07/06/2023)  Tobacco Use: Medium Risk (07/06/2023)     Readmission Risk Interventions     No data to display

## 2023-07-07 NOTE — Discharge Summary (Signed)
 Physician Discharge Summary   Patient: Mallory Watts MRN: [id number] DOB: July 20, 1940  Admit date:     07/05/2023  Discharge date: 07/07/23  Discharge Physician: Meredeth Ide   PCP: Creola Corn, MD   Recommendations at discharge:   Follow-up methylmalonic acid level in 1 week at PCP office Follow-up B12 level in 1 month at PCP office Start taking Januvia 25 mg daily -Will need evaluation by gastroenterology as outpatient   Discharge Diagnoses: Principal Problem:   Acute on chronic diastolic heart failure (HCC) Active Problems:   Heart failure (HCC)  Resolved Problems:   * No resolved hospital problems. *    83 y.o. female with medical history significant of chronic diastolic heart failure, chronic cough, paroxysmal atrial fibrillation, CAD status post CABG, OSA, GERD, hyperlipidemia who presents to the ED with multiple complaints.   She was admitted for dyspnea on exertion and chronic cough.  Being treated for heart failure exacerbation.    Also, concern for dark stools, admitted for concern for possible GI bleeding.     Hospital Course: Acute on chronic diastolic heart failure/right heart failure -Significantly improved, not requiring oxygen.  Diuresed well with IV Lasix. Recent echo showed normal EF, indeterminate diastolic parameters, low normal right ventricular systolic function, moderate AV regurgitation -Continue Lasix 40 mg daily at home  Bilateral lower extremity edema -Likely in setting of above -Venous duplex of lower extremities was negative for DVT -Continue Lasix as above  Iron deficiency anemia -FOBT negative, iron saturation 5 -IV iron given in the hospital -Hemoglobin has been stable at 10.1 -Eliquis will be resumed -Will need GI evaluation as outpatient  B12 deficiency -Started on B12 supplementation, will discharge on B12 1000 mcg daily -Follow methylmalonic acid level as outpatient at PCP office  New onset diabetes mellitus type  2 -Hemoglobin A1c 7.1 -Will discharge on Januvia 25 mg daily -CGM will be given before discharge  Atrial Fibrillation Metoprolol, multaq Eliquis (dose reduced based on age, creatinine as noted above)  -Will discharge on Eliquis 2.5 mg p.o. twice daily    Chronic Cough Undergoing trial of trelegy (using breo, incruse here) per pulmonology (05/16/2023 note)   Hypertension Continue metoprolol    Osteoporosis Fosamax weekly    OSA Nightly bipap   CAD s/p CABG Dyslipidemia Lipitor, zetia  Assessment and Plan: No notes have been filed under this hospital service. Service: Hospitalist        Consultants: None Procedures performed:  Disposition: Home Diet recommendation:  Discharge Diet Orders (From admission, onward)     Start     Ordered   07/07/23 0000  Diet - low sodium heart healthy        07/07/23 1056           Cardiac diet DISCHARGE MEDICATION: Allergies as of 07/07/2023       Reactions   Atenolol Other (See Comments)   Gadolinium Nausea Only, Other (See Comments)   "PATIENT FELT DIZZY AND NAUSEOUS AFTER GAD INJECTION. NO VOMITING, NO HIVES, NO RASH., Onset Date: 08/22/2008."   Sulfonamide Derivatives Hives        Medication List     STOP taking these medications    metoprolol tartrate 25 MG tablet Commonly known as: LOPRESSOR   potassium chloride SA 20 MEQ tablet Commonly known as: KLOR-CON M   predniSONE 10 MG tablet Commonly known as: DELTASONE       TAKE these medications    acetaminophen 650 MG CR tablet Commonly known as: TYLENOL Take  1,300 mg by mouth 2 (two) times daily as needed for pain.   alendronate 70 MG tablet Commonly known as: FOSAMAX Take 70 mg by mouth every Saturday. Take with a full glass of water on an empty stomach.   apixaban 2.5 MG Tabs tablet Commonly known as: ELIQUIS Take 1 tablet (2.5 mg total) by mouth 2 (two) times daily. What changed:  medication strength how much to take   atorvastatin 80 MG  tablet Commonly known as: LIPITOR TAKE 1 TABLET BY MOUTH EVERY DAY   Blood Glucose Monitoring Suppl Devi 1 each by Does not apply route in the morning, at noon, and at bedtime. May substitute to any manufacturer covered by patient's insurance.   BLOOD GLUCOSE TEST STRIPS Strp 1 each by In Vitro route in the morning, at noon, and at bedtime. May substitute to any manufacturer covered by patient's insurance.   cyanocobalamin 1000 MCG tablet Take 1 tablet (1,000 mcg total) by mouth daily. Start taking on: July 08, 2023   estradiol 0.1 MG/GM vaginal cream Commonly known as: ESTRACE VAGINAL Place 1 g vaginally 3 (three) times a week.   ezetimibe 10 MG tablet Commonly known as: ZETIA TAKE 1 TABLET BY MOUTH EVERY DAY   fexofenadine 180 MG tablet Commonly known as: ALLEGRA Take 180 mg by mouth daily as needed for allergies or rhinitis.   furosemide 40 MG tablet Commonly known as: LASIX Take 1 tablet (40 mg total) by mouth daily. What changed: when to take this   guaiFENesin 600 MG 12 hr tablet Commonly known as: Mucinex Take 1 tablet (600 mg total) by mouth 2 (two) times daily for 10 days.   Lancet Device Misc 1 each by Does not apply route in the morning, at noon, and at bedtime. May substitute to any manufacturer covered by patient's insurance.   Lancets Misc. Misc 1 each by Does not apply route in the morning, at noon, and at bedtime. May substitute to any manufacturer covered by patient's insurance.   Magnesium 250 MG Tabs Take 250 mg by mouth daily as needed (for leg cramps or constipation).   meclizine 25 MG tablet Commonly known as: ANTIVERT Take 25 mg by mouth 3 (three) times daily as needed for dizziness.   metoprolol succinate 25 MG 24 hr tablet Commonly known as: TOPROL-XL Take 25 mg by mouth 2 (two) times daily.   mometasone 50 MCG/ACT nasal spray Commonly known as: NASONEX PLACE 2 SPRAYS INTO THE NOSE DAILY. What changed:  when to take this reasons to  take this   Multaq 400 MG tablet Generic drug: dronedarone TAKE 1 TABLET (400 MG TOTAL) BY MOUTH 2 (TWO) TIMES DAILY WITH A MEAL.   neomycin-polymyxin b-dexamethasone 3.5-10000-0.1 Oint Commonly known as: MAXITROL Place 1 Application into both eyes 2 (two) times daily as needed (AS DIRECTED).   nitroGLYCERIN 0.4 MG SL tablet Commonly known as: NITROSTAT PLACE 1 TABLET UNDER THE TONGUE EVERY 5 MINUTES AS NEEDED FOR CHEST PAIN.   omeprazole 40 MG capsule Commonly known as: PRILOSEC Take 40 mg by mouth daily before breakfast.   potassium chloride 10 MEQ CR capsule Commonly known as: MICRO-K Take 10 mEq by mouth 2 (two) times daily.   sitaGLIPtin 25 MG tablet Commonly known as: Januvia Take 1 tablet (25 mg total) by mouth daily.   solifenacin 5 MG tablet Commonly known as: VESIcare One daily as needed What changed:  how much to take how to take this when to take this additional instructions  Trelegy Ellipta 100-62.5-25 MCG/ACT Aepb Generic drug: Fluticasone-Umeclidin-Vilant Inhale 1 puff then rinse mouth, once daily   Tums Ultra 1000 1000 MG chewable tablet Generic drug: calcium elemental as carbonate Chew 500 mg by mouth 2 (two) times daily.   valACYclovir 500 MG tablet Commonly known as: VALTREX Take 1 tablet (500 mg total) by mouth daily. Take one tablet by mouth twice a day for 3 days as needed for an outbreak. What changed:  when to take this reasons to take this additional instructions   VITAMIN D-3 PO Take 1 capsule by mouth at bedtime.   Xdemvy 0.25 % Soln Generic drug: Lotilaner Place 1 drop into both eyes in the morning and at bedtime.   zolpidem 10 MG tablet Commonly known as: AMBIEN Take 5-10 mg by mouth at bedtime as needed for sleep.        Follow-up Information     [facility] at [facility]. Schedule an appointment as soon as possible for a visit.   Specialty: Rehabilitation Why: outpatient physical  therapy Contact information: [address] [place] [address] [phone]        Creola Corn, MD Follow up in 1 week(s).   Specialty: Internal Medicine Why: Follow up methylmalonic acid level as outpatient Contact information: [address] [place] [address] [phone]                Discharge Exam: Ceasar Mons Weights   07/06/23 1033 07/06/23 1346 07/07/23 0500  Weight: 77.1 kg 80.3 kg 78.5 kg   General-appears in no acute distress Heart-S1-S2, regular, no murmur auscultated Lungs-clear to auscultation bilaterally, no wheezing or crackles auscultated Abdomen-soft, nontender, no organomegaly Extremities-no edema in the lower extremities Neuro-alert, oriented x3, no focal deficit noted  Condition at discharge: good  The results of significant diagnostics from this hospitalization (including imaging, microbiology, ancillary and laboratory) are listed below for reference.   Imaging Studies: VAS [place] LOWER EXTREMITY VENOUS (DVT) Result Date: 07/06/2023  Lower Venous DVT Study Patient Name:  JAELAH HAUTH  Date of Exam:   07/06/2023 Medical Rec #: [id number]         Accession #:    [id number] Date of Birth: 01/01/1941        Patient Gender: F Patient Age:   17 years Exam Location:  [facility] Procedure:      VAS [place] LOWER EXTREMITY VENOUS (DVT) Referring Phys: A POWELL JR --------------------------------------------------------------------------------  Indications: Edema.  Anticoagulation: Eliquis. Limitations: Body habitus, poor ultrasound/tissue interface and patient movement. Comparison Study: No previous exams Performing Technologist: Jody Hill RVT, RDMS  Examination Guidelines: A complete evaluation includes B-mode imaging, spectral Doppler, color Doppler, and power Doppler as needed of all accessible portions of each vessel. Bilateral testing is considered an integral part of a complete examination. Limited examinations for reoccurring  indications may be performed as noted. The reflux portion of the exam is performed with the patient in reverse Trendelenburg.  +---------+---------------+---------+-----------+----------+--------------+ RIGHT    CompressibilityPhasicitySpontaneityPropertiesThrombus Aging +---------+---------------+---------+-----------+----------+--------------+ CFV      Full           No                                           +---------+---------------+---------+-----------+----------+--------------+ SFJ      Full                                                        +---------+---------------+---------+-----------+----------+--------------+  FV Prox  Full           No                                           +---------+---------------+---------+-----------+----------+--------------+ FV Mid   Full           No                                           +---------+---------------+---------+-----------+----------+--------------+ FV DistalFull           No                                           +---------+---------------+---------+-----------+----------+--------------+ PFV                     No       Yes                                 +---------+---------------+---------+-----------+----------+--------------+ POP      Full           No                                           +---------+---------------+---------+-----------+----------+--------------+ PTV      Full                                                        +---------+---------------+---------+-----------+----------+--------------+ PERO     Full                                                        +---------+---------------+---------+-----------+----------+--------------+   +---------+---------------+---------+-----------+----------+-------------------+ LEFT     CompressibilityPhasicitySpontaneityPropertiesThrombus Aging       +---------+---------------+---------+-----------+----------+-------------------+ CFV      Full           No                                                +---------+---------------+---------+-----------+----------+-------------------+ SFJ      Full                                                             +---------+---------------+---------+-----------+----------+-------------------+ FV Prox  Full           No                                                +---------+---------------+---------+-----------+----------+-------------------+  FV Mid   Full           No                                                +---------+---------------+---------+-----------+----------+-------------------+ FV DistalFull           No                                                +---------+---------------+---------+-----------+----------+-------------------+ PFV      Full                                                             +---------+---------------+---------+-----------+----------+-------------------+ POP      Full           No                                                +---------+---------------+---------+-----------+----------+-------------------+ PTV      Full                                                             +---------+---------------+---------+-----------+----------+-------------------+ PERO                                                  Not well visualized +---------+---------------+---------+-----------+----------+-------------------+ Soleal                                                Not well visualized +---------+---------------+---------+-----------+----------+-------------------+     Summary: BILATERAL: - No evidence of deep vein thrombosis seen in the lower extremities, bilaterally. -Subcutaneous edema, bilaterally. RIGHT: - A cystic structure is found in the popliteal fossa (3.86 x 0.79 x 2.66 cm).  LEFT: - No cystic  structure found in the popliteal fossa.  *See table(s) above for measurements and observations. Electronically signed by Heath Lark on 07/06/2023 at 6:46:03 PM.    Final    US RENAL Result Date: 07/05/2023 CLINICAL DATA:  Acute kidney injury. EXAM: RENAL / URINARY TRACT ULTRASOUND COMPLETE COMPARISON:  None Available. FINDINGS: Right Kidney: Renal measurements: 10.8 x 4.9 x 5.8 cm = volume: 161 mL. Echogenicity within normal limits. No mass or hydronephrosis visualized. Left Kidney: Renal measurements: 11 x 4.7 x 4.8 cm = volume: 129 mL. Echogenicity within normal limits. No mass or hydronephrosis visualized. Bladder: Appears normal for degree of bladder distention. Other: Incidental finding of a left pleural effusion. IMPRESSION: Normal ultrasound appearance of the  kidneys. No mass or hydronephrosis. Left pleural effusion is incidentally demonstrated. Electronically Signed   By: Burman Nieves M.D.   On: 07/05/2023 22:09   DG Chest 2 View Result Date: 07/05/2023 CLINICAL DATA:  Dyspnea EXAM: CHEST - 2 VIEW COMPARISON:  05/17/2023 FINDINGS: Small bilateral pleural effusions have developed. Lungs are clear. No pneumothorax. Median sternotomy has been performed. Cardiac size within normal limits. No acute bone abnormality. IMPRESSION: 1. Small bilateral pleural effusions. Electronically Signed   By: Helyn Numbers M.D.   On: 07/05/2023 19:59    Microbiology: Results for orders placed or performed during the hospital encounter of 05/17/23  Resp panel by RT-PCR (RSV, Flu A&B, Covid) Anterior Nasal Swab     Status: None   Collection Time: 05/17/23  7:09 AM   Specimen: Anterior Nasal Swab  Result Value Ref Range Status   SARS Coronavirus 2 by RT PCR NEGATIVE NEGATIVE Final   Influenza A by PCR NEGATIVE NEGATIVE Final   Influenza B by PCR NEGATIVE NEGATIVE Final    Comment: (NOTE) The Xpert Xpress SARS-CoV-2/FLU/RSV plus assay is intended as an aid in the diagnosis of influenza from Nasopharyngeal  swab specimens and should not be used as a sole basis for treatment. Nasal washings and aspirates are unacceptable for Xpert Xpress SARS-CoV-2/FLU/RSV testing.  Fact Sheet for Patients: BloggerCourse.com  Fact Sheet for Healthcare Providers: SeriousBroker.it  This test is not yet approved or cleared by the [place] FDA and has been authorized for detection and/or diagnosis of SARS-CoV-2 by FDA under an Emergency Use Authorization (EUA). This EUA will remain in effect (meaning this test can be used) for the duration of the COVID-19 declaration under Section 564(b)(1) of the Act, 21 U.S.C. section 360bbb-3(b)(1), unless the authorization is terminated or revoked.     Resp Syncytial Virus by PCR NEGATIVE NEGATIVE Final    Comment: (NOTE) Fact Sheet for Patients: BloggerCourse.com  Fact Sheet for Healthcare Providers: SeriousBroker.it  This test is not yet approved or cleared by the [place] FDA and has been authorized for detection and/or diagnosis of SARS-CoV-2 by FDA under an Emergency Use Authorization (EUA). This EUA will remain in effect (meaning this test can be used) for the duration of the COVID-19 declaration under Section 564(b)(1) of the Act, 21 U.S.C. section 360bbb-3(b)(1), unless the authorization is terminated or revoked.  Performed at [facility], [address], [place], [place] [address]     Labs: CBC: Recent Labs  Lab 07/05/23 1841 07/05/23 2211 07/06/23 0416 07/07/23 0426  WBC 6.1  --  6.3 6.1  NEUTROABS 3.9  --   --  3.8  HGB 9.6* 10.3* 10.3* 10.1*  HCT 32.2* 36.7 34.2* 34.3*  MCV 84.5  --  83.6 85.5  PLT 241  --  239 227   Basic Metabolic Panel: Recent Labs  Lab 07/05/23 1841 07/06/23 0416 07/07/23 0426  NA 141 142 141  K 3.5 3.7 3.7  CL 108 108 106  CO2 24 25 25   GLUCOSE 191* 133* 126*  BUN 30* 30* 26*  CREATININE  1.63* 1.55* 1.59*  CALCIUM 8.7* 8.8* 8.6*  MG  --   --  1.8  PHOS  --   --  4.3   Liver Function Tests: Recent Labs  Lab 07/05/23 1841 07/07/23 0426  AST 35 25  ALT 36 28  ALKPHOS 77 77  BILITOT 0.8 1.0  PROT 6.4* 6.3*  ALBUMIN 3.4* 3.2*   CBG: Recent Labs  Lab 07/06/23 0814 07/06/23  1228 07/06/23 1616 07/06/23 2002 07/07/23 0718  GLUCAP 117* 150* 99 203* 137*    Discharge time spent: greater than 30 minutes.  Signed: Meredeth Ide, MD [facility] 07/07/2023

## 2023-07-10 LAB — METHYLMALONIC ACID, SERUM: Methylmalonic Acid, Quantitative: 242 nmol/L (ref 0–378)

## 2023-07-31 NOTE — Progress Notes (Unsigned)
 Cardiology Office Note:  .   Date:  07/31/2023  ID:  Mallory Watts, DOB June 12, 1940, MRN [id number] PCP: Margarete Sharps, MD  [facility] Providers Cardiologist:  Ola Berger, MD    Patient Profile: .      PMH Coronary artery disease S/p CABG in 2004 Stress myoview  2023 low risk Atrial fibrillation Central sleep apnea Hypertension Hyperlipidemia  She wore a 14-day monitor in April 2023 which showed atrial fibrillation 100% of the time.  HR ranges 44 to 187 bpm with average HR 79 bpm.  She was seen by Dr. Avanell Bob and placed on Multaq .  She reported feeling better and EKG at next visit showed sinus rhythm.  She was seen in pulmonary clinic due to sleep study that showed severe sleep apnea.  She was set up for BiPAP.  She also had PFTs which showed moderate decreased DLCO.  Her metoprolol  was decreased and her breathing got better.  Echocardiogram 07/23/2021 showed normal systolic function, unable to evaluate diastolic function, mild to moderate aortic valve insufficiency, and mild to moderate TR.  Last cardiology clinic visit was 07/04/2022 with Dr. Avanell Bob.  She reported she had felt more unsteady since having her left knee replaced.  She reported increased DOE.  Clinically appeared to be in sinus rhythm.  She was compliant with BiPAP for sleep apnea.  3-day ZIO monitor was ordered and revealed sinus rhythm with average HR 67 bpm, HR range 45 to 109 bpm, rare PVC, PAC, triggered events corresponded to SR, and SR with PVC.  Cardiac PET/CT was ordered and completed 10/18/2022 to rule out worsening ischemia.  She had no evidence of ischemia or scar on CT.  She contacted our office 03/22/2023 to report shortness of breath for 1 week when moving around.  She was also feeling fatigued and lightheaded.  She was also having a productive cough and was taking Robitussin DM.  Was seen by pulmonologist the day prior and was told to contact them if no improvement in symptoms.       History of Present  Illness: .   Mallory Watts is a very pleasant 83 y.o. female who is here today for follow-up of CAD. She is having a persistent cough and increased shortness of breath. She reports that the cough is constant since December and unresponsive to cough suppressants.  She has been able to use her BiPAP machine due to the coughing and is subsequently not sleeping well. Seen by PCP recently with negative COVID test and normal CXR. She reports a fall in December from a chair, which resulted in significant bruising. Speculates that the fall may have been due to an episode of vertigo, for which she sometimes takes meclizine. She  reports increased shortness of breath, even with minimal exertion such as walking from the front door to the car. She is having bilateral LE edema and feels like she has gained weight but has she has not been monitoring weight consistently. Reports she is fairly active around the house and is up and down stairs often due to living in a 3 story home. Occasionally sees "rust spots" in her urine but denies that urine is dark in color. She continues to take Eliquis  with no additional evidence of bleeding. She denies chest pain, palpitations, orthopnea, PND, presyncope, syncope. She asks if we can help get her in with her pulmonologist sooner than March.   Discussed the use of AI scribe software for clinical note transcription with the patient, who gave verbal  consent to proceed.   ROS: See HPI       Studies Reviewed: .        Risk Assessment/Calculations:    CHA2DS2-VASc Score = 5   This indicates a 7.2% annual risk of stroke. The patient's score is based upon: CHF History: 0 HTN History: 1 Diabetes History: 0 Stroke History: 0 Vascular Disease History: 1 Age Score: 2 Gender Score: 1   {This patient has a significant risk of stroke if diagnosed with atrial fibrillation.  Please consider VKA or DOAC agent for anticoagulation if the bleeding risk is acceptable.   You can also  use the SmartPhrase .[email or web address] for documentation.   :[id number]} No BP recorded.  {Refresh Note OR Click here to enter BP  :1}***       Physical Exam:   VS:  LMP 04/11/1994    Wt Readings from Last 3 Encounters:  07/07/23 173 lb 1 oz (78.5 kg)  05/17/23 188 lb (85.3 kg)  05/16/23 177 lb 9.6 oz (80.6 kg)    GEN: Well nourished, well developed in no acute distress NECK: No JVD; No carotid bruits CARDIAC: RRR, soft murmur. No rubs, gallops RESPIRATORY:  Clear to auscultation without rales, wheezing or rhonchi  ABDOMEN: Soft, non-tender, non-distended EXTREMITIES:  No edema; No deformity     ASSESSMENT AND PLAN: .    Shortness of breath: Increased since last visit in March. Reports increased shortness of breath with minimal walking. Frequently goes up and down stairs in her home and denies significant dyspnea. Possible multifactorial etiology including cardiac and pulmonary causes. Has had a chronic cough since December which is interfering with her BiPAP.  She is not sleeping well.  History of mild to moderate aortic valve insufficiency and mild to moderate TR on echo 07/2021, EF was normal. Bilateral LE edema. We will update echocardiogram to assess valve function and heart function. She has been taking furosemide  40 mg every other day. Advised her to take furosemide  40 mg daily x 3 days. We will call her in 5 days to assess her response. Advised patient to seek earlier appointment with pulmonologist and I will forward my note to him as well.   Valve disease: Mild to moderate aortic valve insufficiency and mild to moderate TR on echo 07/2021.  Soft murmur noted on exam.  We will get updated echocardiogram to assess valve function.  Leg edema: Bilateral LE edema with erythema noted.  She has been alternating furosemide  40 mg every other day with triamterene /hydrochlorothiazide .  Advised her to take furosemide  40 mg daily x 3 days to see if leg edema improves.  Advised leg compression and elevation  and demonstrated appropriate leg elevation. If edema improves with furosemide , will consider continuing daily furosemide .   CAD: History of CABG 2004. Cardiac PET CT 10/2022 with no evidence of ischemia or scar.  She is not having chest pain.  As noted above, is having some SOB.  No indication for further ischemia evaluation at this time.  We will continue GDMT including atorvastatin , ezetimibe , metoprolol , Maxide. She is not on aspirin  in the setting of OAC for PAF.   PAF on chronic anticoagulation: No episodes of tachypalpitations.  Clinically she appears to be in sinus rhythm today.  HR is well controlled today. No bleeding concerns. Continue Eliquis  5 mg twice daily which is appropriate dose for stroke prevention for CHA2DS2-VASc score of 5.  She is on Multaq  for rhythm control. Continue metoprolol  for rate control.  Hypertension: BP is well controlled.  She denies episodes of hypotension that may have contributed to fall.   Hyperlipidemia LDL goal < 55: Lipid panel completed 01/08/23 reveals total cholesterol 120, HDL 46, LDL 59, and triglycerides 73. LDL is close to goal. We will continue atorvastatin  and ezetimibe .        Disposition: 2-3 months with Dr. Avanell Bob or APP  Signed, Slater Duncan, NP-C

## 2023-08-01 ENCOUNTER — Other Ambulatory Visit: Payer: Self-pay | Admitting: Internal Medicine

## 2023-08-01 ENCOUNTER — Ambulatory Visit: Payer: Medicare Other | Attending: Internal Medicine | Admitting: Internal Medicine

## 2023-08-01 ENCOUNTER — Ambulatory Visit

## 2023-08-01 VITALS — BP 130/60 | HR 94 | Wt 175.6 lb

## 2023-08-01 DIAGNOSIS — Z79899 Other long term (current) drug therapy: Secondary | ICD-10-CM

## 2023-08-01 DIAGNOSIS — I10 Essential (primary) hypertension: Secondary | ICD-10-CM

## 2023-08-01 DIAGNOSIS — I351 Nonrheumatic aortic (valve) insufficiency: Secondary | ICD-10-CM

## 2023-08-01 DIAGNOSIS — I255 Ischemic cardiomyopathy: Secondary | ICD-10-CM

## 2023-08-01 DIAGNOSIS — I2510 Atherosclerotic heart disease of native coronary artery without angina pectoris: Secondary | ICD-10-CM

## 2023-08-01 DIAGNOSIS — I4892 Unspecified atrial flutter: Secondary | ICD-10-CM

## 2023-08-01 DIAGNOSIS — R55 Syncope and collapse: Secondary | ICD-10-CM

## 2023-08-01 DIAGNOSIS — I340 Nonrheumatic mitral (valve) insufficiency: Secondary | ICD-10-CM

## 2023-08-01 NOTE — Progress Notes (Unsigned)
 Enrolled for [employer] to mail a ZIO XT long term holter monitor to the patients address on file.

## 2023-08-01 NOTE — Patient Instructions (Signed)
 Medication Instructions:   *If you need a refill on your cardiac medications before your next appointment, please call your pharmacy*  Lab Work: Cmet and pro bnp  If you have labs (blood work) drawn today and your tests are completely normal, you will receive your results only by: MyChart Message (if you have MyChart) OR A paper copy in the mail If you have any lab test that is abnormal or we need to change your treatment, we will call you to review the results.  Testing/Procedures: Delane Fear- Long Term Monitor Instructions  Your physician has requested you wear a ZIO patch monitor for 7 days.  This is a single patch monitor. [employer] supplies one patch monitor per enrollment. Additional stickers are not available. Please do not apply patch if you will be having a Nuclear Stress Test,  Echocardiogram, Cardiac CT, MRI, or Chest Xray during the period you would be wearing the  monitor. The patch cannot be worn during these tests. You cannot remove and re-apply the  ZIO XT patch monitor.  Your ZIO patch monitor will be mailed 3 day USPS to your address on file. It may take 3-5 days  to receive your monitor after you have been enrolled.  Once you have received your monitor, please review the enclosed instructions. Your monitor  has already been registered assigning a specific monitor serial # to you.  Billing and Patient Assistance Program Information  We have supplied [employer] with any of your insurance information on file for billing purposes. [employer] offers a sliding scale Patient Assistance Program for patients that do not have  insurance, or whose insurance does not completely cover the cost of the ZIO monitor.  You must apply for the Patient Assistance Program to qualify for this discounted rate.  To apply, please call [employer] at [phone], select option 4, select option 2, ask to apply for  Patient Assistance Program. Sanna Crystal will ask your household income, and how many people  are  in your household. They will quote your out-of-pocket cost based on that information.  [employer] will also be able to set up a 35-month, interest-free payment plan if needed.  Applying the monitor   Shave hair from upper left chest.  Hold abrader disc by orange tab. Rub abrader in 40 strokes over the upper left chest as  indicated in your monitor instructions.  Clean area with 4 enclosed alcohol  pads. Let dry.  Apply patch as indicated in monitor instructions. Patch will be placed under collarbone on left  side of chest with arrow pointing upward.  Rub patch adhesive wings for 2 minutes. Remove white label marked "1". Remove the white  label marked "2". Rub patch adhesive wings for 2 additional minutes.  While looking in a mirror, press and release button in center of patch. A small green light will  flash 3-4 times. This will be your only indicator that the monitor has been turned on.  Do not shower for the first 24 hours. You may shower after the first 24 hours.  Press the button if you feel a symptom. You will hear a small click. Record Date, Time and  Symptom in the Patient Logbook.  When you are ready to remove the patch, follow instructions on the last 2 pages of Patient  Logbook. Stick patch monitor onto the last page of Patient Logbook.  Place Patient Logbook in the blue and white box. Use locking tab on box and tape box closed  securely. The blue and white box  has prepaid postage on it. Please place it in the mailbox as  soon as possible. Your physician should have your test results approximately 7 days after the  monitor has been mailed back to [facility].  Call [facility] Customer Care at [phone] if you have questions regarding  your ZIO XT patch monitor. Call them immediately if you see an orange light blinking on your  monitor.  If your monitor falls off in less than 4 days, contact our Monitor department at [phone].  If your monitor becomes loose or falls  off after 4 days call [employer] at [phone] for  suggestions on securing your monitor   Follow-Up: 4-6 weeks with Dr Avanell Bob   At [facility], you and your health needs are our priority.  As part of our continuing mission to provide you with exceptional heart care, our providers are all part of one team.  This team includes your primary Cardiologist (physician) and Advanced Practice Providers or APPs (Physician Assistants and Nurse Practitioners) who all work together to provide you with the care you need, when you need it.  Your next appointment:  We recommend signing up for the patient portal called "MyChart".  Sign up information is provided on this After Visit Summary.  MyChart is used to connect with patients for Virtual Visits (Telemedicine).  Patients are able to view lab/test results, encounter notes, upcoming appointments, etc.  Non-urgent messages can be sent to your provider as well.   To learn more about what you can do with MyChart, go to [email or web address].   Other Instructions       1st Floor: - Lobby - Registration  - Pharmacy  - Lab - Cafe  2nd Floor: - PV Lab - Diagnostic Testing (echo, CT, nuclear med)  3rd Floor: - Vacant  4th Floor: - TCTS (cardiothoracic surgery) - AFib Clinic - Structural Heart Clinic - Vascular Surgery  - Vascular Ultrasound  5th Floor: - HeartCare Cardiology (general and EP) - Clinical Pharmacy for coumadin, hypertension, lipid, weight-loss medications, and med management appointments    Valet parking services will be available as well.

## 2023-08-02 LAB — COMPREHENSIVE METABOLIC PANEL WITH GFR
ALT: 16 IU/L (ref 0–32)
AST: 17 IU/L (ref 0–40)
Albumin: 3.7 g/dL — ABNORMAL LOW (ref 3.8–4.8)
Alkaline Phosphatase: 100 IU/L (ref 44–121)
BUN/Creatinine Ratio: 16 (ref 12–28)
BUN: 23 mg/dL (ref 8–27)
Bilirubin Total: 0.9 mg/dL (ref 0.0–1.2)
CO2: 25 mmol/L (ref 20–29)
Calcium: 8.6 mg/dL — ABNORMAL LOW (ref 8.7–10.3)
Chloride: 107 mmol/L — ABNORMAL HIGH (ref 96–106)
Creatinine, Ser: 1.41 mg/dL — ABNORMAL HIGH (ref 0.57–1.00)
Globulin, Total: 2.6 g/dL (ref 1.5–4.5)
Glucose: 116 mg/dL — ABNORMAL HIGH (ref 70–99)
Potassium: 3.4 mmol/L — ABNORMAL LOW (ref 3.5–5.2)
Sodium: 144 mmol/L (ref 134–144)
Total Protein: 6.3 g/dL (ref 6.0–8.5)
eGFR: 38 mL/min/{1.73_m2} — ABNORMAL LOW (ref >59)

## 2023-08-02 LAB — PRO B NATRIURETIC PEPTIDE: NT-Pro BNP: 2824 pg/mL — ABNORMAL HIGH (ref 0–738)

## 2023-08-03 ENCOUNTER — Other Ambulatory Visit: Payer: Self-pay

## 2023-08-03 DIAGNOSIS — Z79899 Other long term (current) drug therapy: Secondary | ICD-10-CM

## 2023-08-03 MED ORDER — FUROSEMIDE 40 MG PO TABS: 80.0000 mg | ORAL_TABLET | Freq: Every day | ORAL | Status: DC

## 2023-08-09 ENCOUNTER — Telehealth: Payer: Self-pay | Admitting: Internal Medicine

## 2023-08-09 NOTE — Telephone Encounter (Signed)
 Spoke with patient and she states she would like for us  to cancel her my chart because dies not like all the alerts that comes to her to constantly log in. She see no reason to log in everyday.  Informed patient I can not cancel her mychart. She states she will call the help desk later

## 2023-08-09 NOTE — Telephone Encounter (Signed)
 Pt states that she has been receiving messages to check her mychart but she can't get in it to check them, requesting cb to discuss

## 2023-08-18 ENCOUNTER — Other Ambulatory Visit: Payer: Self-pay | Admitting: Hematology and Oncology

## 2023-08-18 DIAGNOSIS — Z1231 Encounter for screening mammogram for malignant neoplasm of breast: Secondary | ICD-10-CM

## 2023-08-18 DIAGNOSIS — N644 Mastodynia: Secondary | ICD-10-CM

## 2023-08-23 ENCOUNTER — Ambulatory Visit: Payer: Self-pay | Admitting: Internal Medicine

## 2023-08-23 DIAGNOSIS — R55 Syncope and collapse: Secondary | ICD-10-CM

## 2023-08-23 DIAGNOSIS — I4892 Unspecified atrial flutter: Secondary | ICD-10-CM | POA: Diagnosis not present

## 2023-08-26 NOTE — Progress Notes (Signed)
 Cardiology Office Note   Date:  08/28/2023   ID:  Mallory Watts, DOB 18-Apr-1940, MRN [id number]  PCP:  Mallory Sharps, MD  Cardiologist:   Mallory Berger, MD   F/U of HFpEF and PAF  History of Present Illness: Mallory Watts is a 83 y.o. female with a history of CAD (status post CABG in 2004, stress myoview  in 2023 showed no ischemia), PAF, central sleep apnea,.      The pt wore a 14 day monitor in APril 2023 which showed atrial fibrillation 100% time   HR ranges 44 to 187 bpm   Average HR 79 bpm  I saw her after and placed her on Multaq    One day, after starting Multaq , she said she felt better  EKG at next visit showed SR     Pt seen in [facility]monary Clinic She had sleep study that showed severe sleep apnea  Set up for BiPAP.    The pt also had PFTs  which showed moderate decreased DLCO.   Cut back on metoprolol    Breathing got better      I saw the pt in March 2024 PET CT in July 2024 was normal    IN the interval she was seen by Mallory Watts in Jan 2025  At that time she noted increased SOB some LE edema   Rx extra lasix  for a few days  Echo in Feb 2025 showed LVEF normal   Mod TR, Mod MR, Mod AI   I saw the pt in APril 2025  She had complained of some dizziness with quick standing at the time   Also complained of continued cough and significant edema since hospital D/C   After visit labs done and it was recomm that she increaes furosemide    SInce seen she says she is doing some better   Actually 2 days ago she had a great day   Walked up 14 steps without having to stop  The next day she felt more tired  Admits that cough is gone   Also that edema in legs improved  She had one episode of R neck pain 4 days ago   Took NTG  Eased   Very brief  None since   Says she is eating more at home     Weighs in more but not every day   PT wore a monitor   THis showed SR  No afib  11% PVCs   Diet   Breakfast  Toast and coffee Lunch  Salad  No protein    Or sandwich  coffee  Unsweet tea Dinner    Salad with protein   Current Meds  Medication Sig   acetaminophen  (TYLENOL ) 650 MG CR tablet Take 1,300 mg by mouth 2 (two) times daily as needed for pain.   alendronate  (FOSAMAX ) 70 MG tablet Take 70 mg by mouth every Saturday. Take with a full glass of water  on an empty stomach.   apixaban  (ELIQUIS ) 2.5 MG TABS tablet Take 1 tablet (2.5 mg total) by mouth 2 (two) times daily.   atorvastatin  (LIPITOR) 80 MG tablet TAKE 1 TABLET BY MOUTH EVERY DAY   Cholecalciferol (VITAMIN D-3 PO) Take 1 capsule by mouth at bedtime.   cyanocobalamin  1000 MCG tablet Take 1 tablet (1,000 mcg total) by mouth daily.   dronedarone  (MULTAQ ) 400 MG tablet TAKE 1 TABLET (400 MG TOTAL) BY MOUTH 2 (TWO) TIMES DAILY WITH A MEAL.   estradiol  (ESTRACE  VAGINAL) 0.1 MG/GM vaginal cream  Place 1 g vaginally 3 (three) times a week.   ezetimibe  (ZETIA ) 10 MG tablet TAKE 1 TABLET BY MOUTH EVERY DAY   fexofenadine (ALLEGRA) 180 MG tablet Take 180 mg by mouth daily as needed for allergies or rhinitis.   furosemide  (LASIX ) 40 MG tablet Take 2 tablets (80 mg total) by mouth daily.   Magnesium  250 MG TABS Take 250 mg by mouth daily as needed (for leg cramps or constipation).   meclizine (ANTIVERT) 25 MG tablet Take 25 mg by mouth 3 (three) times daily as needed for dizziness.   metoprolol  succinate (TOPROL -XL) 25 MG 24 hr tablet Take 25 mg by mouth 2 (two) times daily.   mometasone  (NASONEX ) 50 MCG/ACT nasal spray PLACE 2 SPRAYS INTO THE NOSE DAILY. (Patient taking differently: Place 2 sprays into the nose daily as needed (for allergies).)   neomycin -polymyxin b-dexamethasone  (MAXITROL ) 3.5-10000-0.1 OINT Place 1 Application into both eyes 2 (two) times daily as needed (AS DIRECTED).   nitroGLYCERIN  (NITROSTAT ) 0.4 MG SL tablet PLACE 1 TABLET UNDER THE TONGUE EVERY 5 MINUTES AS NEEDED FOR CHEST PAIN.   omeprazole (PRILOSEC) 40 MG capsule Take 40 mg by mouth daily before breakfast.   potassium chloride  (MICRO-K ) 10 MEQ CR capsule Take  40 mEq by mouth daily.   solifenacin  (VESICARE ) 5 MG tablet One daily as needed (Patient taking differently: Take 5 mg by mouth daily.)   TUMS ULTRA 1000 1000 MG chewable tablet Chew 500 mg by mouth 2 (two) times daily.   valACYclovir  (VALTREX ) 500 MG tablet Take 1 tablet (500 mg total) by mouth daily. Take one tablet by mouth twice a day for 3 days as needed for an outbreak. (Patient taking differently: Take 500 mg by mouth daily.)   zolpidem  (AMBIEN ) 10 MG tablet Take 5-10 mg by mouth at bedtime as needed for sleep.   [DISCONTINUED] Blood Glucose Monitoring Suppl DEVI 1 each by Does not apply route in the morning, at noon, and at bedtime. May substitute to any manufacturer covered by patient's insurance.   [DISCONTINUED] Fluticasone -Umeclidin-Vilant (TRELEGY ELLIPTA ) 100-62.5-25 MCG/ACT AEPB Inhale 1 puff then rinse mouth, once daily   [DISCONTINUED] sitaGLIPtin  (JANUVIA ) 25 MG tablet Take 1 tablet (25 mg total) by mouth daily.   [DISCONTINUED] XDEMVY  0.25 % SOLN Place 1 drop into both eyes in the morning and at bedtime.       Allergies:   Atenolol, Gadolinium, and Sulfonamide derivatives   Past Medical History:  Diagnosis Date   Arthritis    Atrial fibrillation (HCC)    not symptomatic -on metoprolol  BID for this    CAD (coronary artery disease)    s/p [facility] at [facility]   Cancer [facility]) 1998   breast cancer--left   Dysrhythmia    atrial flutter/fib   GERD (gastroesophageal reflux disease)    Hypertension    Osteopenia    Personal history of chemotherapy 1998   Personal history of radiation therapy 1998   PONV (postoperative nausea and vomiting)    Shortness of breath    exertion   STD (sexually transmitted disease)    HSV   Urinary incontinence     Past Surgical History:  Procedure Laterality Date   ANTERIOR CERVICAL DECOMP/DISCECTOMY FUSION N/A 01/14/2014   Procedure: ANTERIOR CERVICAL DECOMPRESSION/DISCECTOMY FUSION CERVICAL FOUR-FIVE,CERVICAL FIVE SIX;  Surgeon: Elna Haggis, MD;  Location: [facility];  Service: Neurosurgery;  Laterality: N/A;   BREAST EXCISIONAL BIOPSY Left    BREAST LUMPECTOMY Left 1998   BREAST SURGERY  1998   left lumpectomy for breast ca   COLONOSCOPY     CORONARY ARTERY BYPASS GRAFT  2004   at [facility]   DILATION AND CURETTAGE OF UTERUS  2006   heart bypass  2003   -single   POLYPECTOMY     TONSILLECTOMY     TOTAL KNEE ARTHROPLASTY Left 11/28/2016   Procedure: LEFT TOTAL KNEE ARTHROPLASTY;  Surgeon: Liliane Rei, MD;  Location: [facility];  Service: Orthopedics;  Laterality: Left;  Canal block     Social History:  The patient  reports that she has quit smoking. Her smoking use included cigarettes. She has never used smokeless tobacco. She reports current alcohol  use of about 1.0 standard drink of alcohol  per week. She reports that she does not use drugs.   Family History:  The patient's family history includes Diabetes in her brother and mother; Stroke in her father and mother.    ROS:  Please see the history of present illness. All other systems are reviewed and  Negative to the above problem except as noted.    PHYSICAL EXAM: VS:  BP 130/80   Pulse 60   Ht 5\' 2"  (1.575 m)   Wt 171 lb 9.6 oz (77.8 kg)   LMP 04/11/1994   SpO2 95%   BMI 31.39 kg/m   GEN: Obese 83 yo  in no acute distress HEENT: normal  Neck: JVP is normal Cardiac: RRR  No S3 NO murmurs    1+  edema   Respiratory:  clear to auscultation  GI: soft, nontender No hepatomegaly    EKG:  EKG is ordered today.  SR 62 bpm    Cardiac studies     Monitor May 2025  mpression:   Sinus rhythm   Rates 56 to 103 bpm  Average HR 72 bpm    Rare PACs, Frequent PVCs  (11.4% total) 7 episodes SVT  fastest for 9 beats at 130 bpm, longest for 18 seconds at 103 bpm   Diary entries/triggered events corresponded to SR with PVCs    Echo  Feb 2025   1. Left ventricular ejection fraction, by estimation, is 50 to 55% . The left ventricle has low normal function. The left  ventricle has no regional wall motion abnormalities. Left ventricular diastolic parameters are indeterminate. 2. Right ventricular systolic function is low normal. The right ventricular size is mildly enlarged. There is normal pulmonary artery systolic pressure. The estimated right ventricular systolic pressure is 31. 1 mmHg. 3. Left atrial size was moderately dilated. 4. Right atrial size was moderately dilated. 5. The mitral valve is degenerative. Moderate mitral valve regurgitation. Moderate mitral annular calcification. 6. Tricuspid valve regurgitation is moderate. 7. The aortic valve was not well visualized. Aortic valve regurgitation is moderate. Aortic regurgitation PHT measures 350 msec. 8. There is moderate dilatation of the ascending aorta, measuring 39 mm. 9. The inferior vena cava is normal in size with greater than 50% respiratory variability, suggesting right atrial pressure of 3 mmHg.  FINDINGS Left Ventricle: Left ventricular ejection fraction, by estimation, is 50 to 55% . The left ventricle has  NM/PET  July 2024   The study is normal. The study is low risk.   LV perfusion is normal. There is no evidence of ischemia. There is no evidence of infarction.   No ST deviation was noted. Arrhythmias during stress: occasional PVCs. Arrhythmias during recovery: occasional PVCs.   Rest left ventricular function is normal. Rest EF: 59 %. Stress left ventricular function is  normal. Stress EF: 63 %. End diastolic cavity size is normal. End systolic cavity size is normal. No evidence of transient ischemic dilation (TID) noted.   Myocardial blood flow was computed to be 0.51ml/g/min at rest and 1.94ml/g/min at stress. Global myocardial blood flow reserve was 2.43 and was normal.   Coronary calcium  assessment not performed due to prior revascularization.    Event monitor    07/2021  Afib 100% time    Echo  07/23/21  Left ventricular ejection fraction, by estimation, is 60 to 65%. The left  ventricle has normal function. The left ventricle has no regional wall motion abnormalities. There is mild left ventricular hypertrophy. Left ventricular diastolic parameters are indeterminate. 1. Right ventricular systolic function is normal. The right ventricular size is normal. There is normal pulmonary artery systolic pressure. 2. 3. Left atrial size was moderately dilated. 4. Right atrial size was severely dilated. The mitral valve is degenerative. Mild mitral valve regurgitation. Moderate mitral annular calcification. 5. 6. Tricuspid valve regurgitation is mild to moderate. 7. The aortic valve was not well visualized. Aortic valve regurgitation is mild to moderate. The inferior vena cava is normal in size with greater than 50% respiratory variability, suggesting right atrial pressure of 3 mmHg. 8. Comparison(s): No significant change from prior study.  Myoview    06/2021    ECG is abnormal. ECG rhythm shows atrial fibrillation. The ECG shows premature ventricular contractions.   Breast attenuation artifact was present.   LV perfusion is abnormal. There is no evidence of ischemia. There is no evidence of infarction. There is normal wall motion in the defect area. Consistent with artifact caused by breast attenuation.   Left ventricular function is normal. Nuclear stress EF: calculated at 47 % but visually appears at least 50-55%. The left ventricular ejection fraction is mildly decreased (45-54%). End diastolic cavity size is normal. End systolic cavity size is normal.   Prior study not available for comparison.   The study is normal. The study is low risk.   Consider 2D echo to assess EF further.   Lipid Panel    Component Value Date/Time   CHOL 115 08/31/2021 1216   TRIG 111 08/31/2021 1216   HDL 40 08/31/2021 1216   CHOLHDL 2.9 08/31/2021 1216   LDLCALC 55 08/31/2021 1216      Wt Readings from Last 3 Encounters:  08/28/23 171 lb 9.6 oz (77.8 kg)  08/01/23 175 lb 9.6  oz (79.7 kg)  07/07/23 173 lb 1 oz (78.5 kg)      ASSESSMENT AND PLAN:   1 HFpEF       Recent echo LVEF and RVEF normal   Mod TR and MR At last visit lasix  increased  She is clinically improved though still with some edema I would recheck labs today  for dose adjustment Again, stresssed watching ingredients to food  LImit sodium    Adjustments will be based on test results   2  CAD Recent PET/CT LExiscan  was normal     Pt is without CP    3  Presyncope   Monitor showed no afib   11% PVCs  Follow    PT asymptomatic   4   PAF   On Multaq  and Eliquis     Continue      5 Valvular dz.  Pt with moderate MR, TR and AI   Will need to follow up over time       6.  dyslipidemia Keep on lipitor  LDL 59  HDL 46  Trig 73     7.  Sleep apnea Uses BiPAP  8  DM  REcent A1C 7.1   On Januvia   Confirm   Diet has changed   Check Encouraged her to get a Stelo to follow glucose response to food   LImit carbs, processed foods  Get more protein  Follow up in clinic in Aug      Current medicines are reviewed at length with the patient today.  The patient does not have concerns regarding medicines.  Signed, Mallory Berger, MD  08/28/2023 10:55 AM    [facility] [address] [place], [place], [place]  [address] Phone: [phone]; Fax: [phone]

## 2023-08-28 ENCOUNTER — Ambulatory Visit: Admitting: Internal Medicine

## 2023-08-28 ENCOUNTER — Ambulatory Visit: Attending: Internal Medicine | Admitting: Internal Medicine

## 2023-08-28 ENCOUNTER — Encounter: Payer: Self-pay | Admitting: Internal Medicine

## 2023-08-28 ENCOUNTER — Other Ambulatory Visit (HOSPITAL_COMMUNITY): Payer: Self-pay

## 2023-08-28 VITALS — BP 130/80 | HR 60 | Ht 62.0 in | Wt 171.6 lb

## 2023-08-28 DIAGNOSIS — I4892 Unspecified atrial flutter: Secondary | ICD-10-CM

## 2023-08-28 DIAGNOSIS — E785 Hyperlipidemia, unspecified: Secondary | ICD-10-CM

## 2023-08-28 DIAGNOSIS — I351 Nonrheumatic aortic (valve) insufficiency: Secondary | ICD-10-CM

## 2023-08-28 DIAGNOSIS — I255 Ischemic cardiomyopathy: Secondary | ICD-10-CM | POA: Diagnosis not present

## 2023-08-28 DIAGNOSIS — I480 Paroxysmal atrial fibrillation: Secondary | ICD-10-CM

## 2023-08-28 DIAGNOSIS — R600 Localized edema: Secondary | ICD-10-CM

## 2023-08-28 DIAGNOSIS — I10 Essential (primary) hypertension: Secondary | ICD-10-CM

## 2023-08-28 DIAGNOSIS — I2510 Atherosclerotic heart disease of native coronary artery without angina pectoris: Secondary | ICD-10-CM

## 2023-08-28 DIAGNOSIS — Z79899 Other long term (current) drug therapy: Secondary | ICD-10-CM

## 2023-08-28 MED ORDER — SITAGLIPTIN PHOSPHATE 25 MG PO TABS
25.0000 mg | ORAL_TABLET | Freq: Every day | ORAL | 1 refills | Status: DC
  Filled 2023-08-28: qty 30, 30d supply, fill #0

## 2023-08-28 MED ORDER — ATORVASTATIN CALCIUM 80 MG PO TABS
80.0000 mg | ORAL_TABLET | Freq: Every day | ORAL | 3 refills | Status: AC
  Filled 2023-11-08: qty 90, 90d supply, fill #0
  Filled 2023-11-13 (×2): qty 30, 30d supply, fill #0
  Filled 2023-12-13 – 2023-12-18 (×3): qty 30, 30d supply, fill #1

## 2023-08-28 MED ORDER — NEOMYCIN-POLYMYXIN-DEXAMETH 0.1 % OP OINT
1.0000 | TOPICAL_OINTMENT | Freq: Every day | OPHTHALMIC | 3 refills | Status: DC | PRN
  Filled 2023-08-28: qty 3.5, 3d supply, fill #0

## 2023-08-28 MED ORDER — SOLIFENACIN SUCCINATE 5 MG PO TABS
5.0000 mg | ORAL_TABLET | Freq: Every day | ORAL | 3 refills | Status: DC
  Filled 2023-11-08: qty 90, 90d supply, fill #0
  Filled 2023-11-13 (×2): qty 30, 30d supply, fill #0

## 2023-08-28 MED ORDER — EZETIMIBE 10 MG PO TABS
10.0000 mg | ORAL_TABLET | Freq: Every day | ORAL | 3 refills | Status: AC
  Filled 2023-11-08: qty 90, 90d supply, fill #0
  Filled 2023-11-13 (×2): qty 30, 30d supply, fill #0
  Filled 2023-12-13 – 2023-12-14 (×2): qty 30, 30d supply, fill #1

## 2023-08-28 MED ORDER — APIXABAN 5 MG PO TABS
5.0000 mg | ORAL_TABLET | Freq: Two times a day (BID) | ORAL | 3 refills | Status: DC
  Filled 2023-11-08: qty 180, 90d supply, fill #0
  Filled 2023-11-13 (×2): qty 60, 30d supply, fill #0

## 2023-08-28 MED ORDER — ALENDRONATE SODIUM 70 MG PO TABS
70.0000 mg | ORAL_TABLET | ORAL | 4 refills | Status: AC
Start: 2022-10-11 — End: ?

## 2023-08-28 NOTE — Patient Instructions (Signed)
 Medication Instructions:   *If you need a refill on your cardiac medications before your next appointment, please call your pharmacy*  Lab Work: BMET, CBC, HGBA1C, PRO BNP  If you have labs (blood work) drawn today and your tests are completely normal, you will receive your results only by: MyChart Message (if you have MyChart) OR A paper copy in the mail If you have any lab test that is abnormal or we need to change your treatment, we will call you to review the results.  Testing/Procedures:   Follow-Up: At [facility], you and your health needs are our priority.  As part of our continuing mission to provide you with exceptional heart care, our providers are all part of one team.  This team includes your primary Cardiologist (physician) and Advanced Practice Providers or APPs (Physician Assistants and Nurse Practitioners) who all work together to provide you with the care you need, when you need it.  Your next appointment:  AUGUST 2025  We recommend signing up for the patient portal called "MyChart".  Sign up information is provided on this After Visit Summary.  MyChart is used to connect with patients for Virtual Visits (Telemedicine).  Patients are able to view lab/test results, encounter notes, upcoming appointments, etc.  Non-urgent messages can be sent to your provider as well.   To learn more about what you can do with MyChart, go to [email or web address].   Other Instructions

## 2023-08-29 ENCOUNTER — Other Ambulatory Visit (HOSPITAL_COMMUNITY): Payer: Self-pay

## 2023-08-29 ENCOUNTER — Ambulatory Visit: Payer: Self-pay | Admitting: Internal Medicine

## 2023-08-29 DIAGNOSIS — R7989 Other specified abnormal findings of blood chemistry: Secondary | ICD-10-CM

## 2023-08-29 DIAGNOSIS — Z79899 Other long term (current) drug therapy: Secondary | ICD-10-CM

## 2023-08-29 DIAGNOSIS — I509 Heart failure, unspecified: Secondary | ICD-10-CM

## 2023-08-29 LAB — BASIC METABOLIC PANEL WITH GFR
BUN/Creatinine Ratio: 17 (ref 12–28)
BUN: 23 mg/dL (ref 8–27)
CO2: 20 mmol/L (ref 20–29)
Calcium: 9.1 mg/dL (ref 8.7–10.3)
Chloride: 103 mmol/L (ref 96–106)
Creatinine, Ser: 1.36 mg/dL — ABNORMAL HIGH (ref 0.57–1.00)
Glucose: 102 mg/dL — ABNORMAL HIGH (ref 70–99)
Potassium: 4.3 mmol/L (ref 3.5–5.2)
Sodium: 139 mmol/L (ref 134–144)
eGFR: 39 mL/min/{1.73_m2} — ABNORMAL LOW (ref >59)

## 2023-08-29 LAB — CBC
Hematocrit: 34.4 % (ref 34.0–46.6)
Hemoglobin: 10.3 g/dL — ABNORMAL LOW (ref 11.1–15.9)
MCH: 24.6 pg — ABNORMAL LOW (ref 26.6–33.0)
MCHC: 29.9 g/dL — ABNORMAL LOW (ref 31.5–35.7)
MCV: 82 fL (ref 79–97)
Platelets: 225 10*3/uL (ref 150–450)
RBC: 4.18 x10E6/uL (ref 3.77–5.28)
RDW: 18.5 % — ABNORMAL HIGH (ref 11.7–15.4)
WBC: 6.9 10*3/uL (ref 3.4–10.8)

## 2023-08-29 LAB — HEMOGLOBIN A1C
Est. average glucose Bld gHb Est-mCnc: 157 mg/dL
Hgb A1c MFr Bld: 7.1 % — ABNORMAL HIGH (ref 4.8–5.6)

## 2023-08-29 LAB — PRO B NATRIURETIC PEPTIDE: NT-Pro BNP: 3663 pg/mL — ABNORMAL HIGH (ref 0–738)

## 2023-08-30 ENCOUNTER — Other Ambulatory Visit (HOSPITAL_COMMUNITY): Payer: Self-pay

## 2023-08-31 ENCOUNTER — Emergency Department (HOSPITAL_BASED_OUTPATIENT_CLINIC_OR_DEPARTMENT_OTHER)

## 2023-08-31 ENCOUNTER — Encounter (HOSPITAL_BASED_OUTPATIENT_CLINIC_OR_DEPARTMENT_OTHER): Payer: Self-pay

## 2023-08-31 ENCOUNTER — Other Ambulatory Visit (HOSPITAL_COMMUNITY): Payer: Self-pay

## 2023-08-31 ENCOUNTER — Other Ambulatory Visit: Payer: Self-pay

## 2023-08-31 ENCOUNTER — Emergency Department (HOSPITAL_BASED_OUTPATIENT_CLINIC_OR_DEPARTMENT_OTHER)
Admission: EM | Admit: 2023-08-31 | Discharge: 2023-08-31 | Disposition: A | Discharge status: Home / Self Care | Attending: Emergency Medicine | Admitting: Emergency Medicine

## 2023-08-31 DIAGNOSIS — S0992XA Unspecified injury of nose, initial encounter: Secondary | ICD-10-CM | POA: Diagnosis present

## 2023-08-31 DIAGNOSIS — W07XXXA Fall from chair, initial encounter: Secondary | ICD-10-CM | POA: Insufficient documentation

## 2023-08-31 DIAGNOSIS — S022XXA Fracture of nasal bones, initial encounter for closed fracture: Secondary | ICD-10-CM | POA: Diagnosis not present

## 2023-08-31 DIAGNOSIS — S0012XA Contusion of left eyelid and periocular area, initial encounter: Secondary | ICD-10-CM | POA: Diagnosis not present

## 2023-08-31 DIAGNOSIS — Z79899 Other long term (current) drug therapy: Secondary | ICD-10-CM | POA: Insufficient documentation

## 2023-08-31 DIAGNOSIS — S0990XA Unspecified injury of head, initial encounter: Secondary | ICD-10-CM | POA: Insufficient documentation

## 2023-08-31 DIAGNOSIS — E876 Hypokalemia: Secondary | ICD-10-CM | POA: Insufficient documentation

## 2023-08-31 DIAGNOSIS — Z7901 Long term (current) use of anticoagulants: Secondary | ICD-10-CM | POA: Insufficient documentation

## 2023-08-31 DIAGNOSIS — I493 Ventricular premature depolarization: Secondary | ICD-10-CM | POA: Diagnosis not present

## 2023-08-31 DIAGNOSIS — S0083XA Contusion of other part of head, initial encounter: Secondary | ICD-10-CM | POA: Insufficient documentation

## 2023-08-31 DIAGNOSIS — S0011XA Contusion of right eyelid and periocular area, initial encounter: Secondary | ICD-10-CM | POA: Insufficient documentation

## 2023-08-31 DIAGNOSIS — W19XXXA Unspecified fall, initial encounter: Secondary | ICD-10-CM

## 2023-08-31 LAB — CBC WITH DIFFERENTIAL/PLATELET
Abs Immature Granulocytes: 0.01 10*3/uL (ref 0.00–0.07)
Basophils Absolute: 0.1 10*3/uL (ref 0.0–0.1)
Basophils Relative: 1 %
Eosinophils Absolute: 0.1 10*3/uL (ref 0.0–0.5)
Eosinophils Relative: 2 %
HCT: 33.5 % — ABNORMAL LOW (ref 36.0–46.0)
Hemoglobin: 10.3 g/dL — ABNORMAL LOW (ref 12.0–15.0)
Immature Granulocytes: 0 %
Lymphocytes Relative: 26 %
Lymphs Abs: 1.6 10*3/uL (ref 0.7–4.0)
MCH: 24.5 pg — ABNORMAL LOW (ref 26.0–34.0)
MCHC: 30.7 g/dL (ref 30.0–36.0)
MCV: 79.8 fL — ABNORMAL LOW (ref 80.0–100.0)
Monocytes Absolute: 0.6 10*3/uL (ref 0.1–1.0)
Monocytes Relative: 10 %
Neutro Abs: 3.9 10*3/uL (ref 1.7–7.7)
Neutrophils Relative %: 61 %
Platelets: 219 10*3/uL (ref 150–400)
RBC: 4.2 MIL/uL (ref 3.87–5.11)
RDW: 21.8 % — ABNORMAL HIGH (ref 11.5–15.5)
WBC: 6.3 10*3/uL (ref 4.0–10.5)
nRBC: 0 % (ref 0.0–0.2)

## 2023-08-31 LAB — COMPREHENSIVE METABOLIC PANEL WITH GFR
ALT: 17 U/L (ref 0–44)
AST: 23 U/L (ref 15–41)
Albumin: 4.1 g/dL (ref 3.5–5.0)
Alkaline Phosphatase: 100 U/L (ref 38–126)
Anion gap: 15 (ref 5–15)
BUN: 31 mg/dL — ABNORMAL HIGH (ref 8–23)
CO2: 24 mmol/L (ref 22–32)
Calcium: 9.5 mg/dL (ref 8.9–10.3)
Chloride: 102 mmol/L (ref 98–111)
Creatinine, Ser: 1.47 mg/dL — ABNORMAL HIGH (ref 0.44–1.00)
GFR, Estimated: 36 mL/min — ABNORMAL LOW (ref >60)
Glucose, Bld: 99 mg/dL (ref 70–99)
Potassium: 3.4 mmol/L — ABNORMAL LOW (ref 3.5–5.1)
Sodium: 142 mmol/L (ref 135–145)
Total Bilirubin: 0.7 mg/dL (ref 0.0–1.2)
Total Protein: 6.9 g/dL (ref 6.5–8.1)

## 2023-08-31 LAB — URINALYSIS, ROUTINE W REFLEX MICROSCOPIC
Bilirubin Urine: NEGATIVE
Glucose, UA: NEGATIVE mg/dL
Hgb urine dipstick: NEGATIVE
Ketones, ur: NEGATIVE mg/dL
Leukocytes,Ua: NEGATIVE
Nitrite: NEGATIVE
Protein, ur: NEGATIVE mg/dL
Specific Gravity, Urine: 1.016 (ref 1.005–1.030)
pH: 5.5 (ref 5.0–8.0)

## 2023-08-31 LAB — MAGNESIUM: Magnesium: 1.6 mg/dL — ABNORMAL LOW (ref 1.7–2.4)

## 2023-08-31 MED ORDER — POTASSIUM CHLORIDE CRYS ER 20 MEQ PO TBCR
40.0000 meq | EXTENDED_RELEASE_TABLET | Freq: Once | ORAL | Status: AC
  Administered 2023-08-31: 40 meq via ORAL
  Filled 2023-08-31: qty 2

## 2023-08-31 MED ORDER — FUROSEMIDE 40 MG PO TABS: ORAL_TABLET | ORAL | 0 refills | Status: DC

## 2023-08-31 MED ORDER — MAGNESIUM SULFATE 2 GM/50ML IV SOLN
2.0000 g | Freq: Once | INTRAVENOUS | Status: AC
  Administered 2023-08-31: 2 g via INTRAVENOUS
  Filled 2023-08-31: qty 50

## 2023-08-31 MED ORDER — EMPAGLIFLOZIN 10 MG PO TABS: 10.0000 mg | ORAL_TABLET | Freq: Every day | ORAL | 0 refills | Status: DC

## 2023-08-31 MED ORDER — EMPAGLIFLOZIN 10 MG PO TABS: 10.0000 mg | ORAL_TABLET | Freq: Every day | ORAL | 3 refills | Status: DC

## 2023-08-31 NOTE — Telephone Encounter (Signed)
 Samples of Jardiance 10 mg (28 tablets total- LOT [id number] Exp-03/2025) left in the sample closet for the pt to come and pick up at her earliest convenience.

## 2023-08-31 NOTE — ED Notes (Signed)
 Pt d/c instructions, follow-up care, and medications reviewed with pt, pt verbalized understanding and had no further questions at time of d/c. Pt CA&Ox4, ambulatory, and in NAD at time of d/c

## 2023-08-31 NOTE — ED Triage Notes (Signed)
 Pt c/o fall from chair height, fell onto forehead, nose on dining table. ? Syncopal episode. Denies HA, NV, neuro symptoms. On eliquis  for afib  Advises hx of falls

## 2023-08-31 NOTE — Telephone Encounter (Signed)
 The patient has been notified of the result and verbalized understanding.  All questions (if any) were answered.  Pt aware that per Dr. Avanell Bob she wants to change her lasix  dosing to taking 1 1/2 tablets in the AM (60 mg total) and 1 tablet in the PM (40 mg total).    Pt also aware we will start her on Jardiance 10 mg po daily before breakfast.  Pt aware we will leave samples of this medication to pick up at the office, at her earliest convenience.    Pt is aware that with the following changes, we will need to repeat her BMET and PRO-BNP in 10 days.  She is aware that I will place these orders in the system and she will need to write down on her calendar to come and have this done at our [facility] in around 6/2 or 6/3.  Confirmed the pharmacy of choice with the pt.   While endorsing these results to the pt, she mentioned that she is  currently at [facility] ER for a fall she had earlier today.  She states she will need a nurse to call her back tomorrow to go over all these instructions again, for she states she does not have a pen to write this down and no family with her at this time to take notes.  She states she will forget everything I am telling her, if she doesn't write this down.  She is requesting a call back tomorrow to go over all these instructions again.  Will have Barnie Libra RN reach out to her again tomorrow to go over these results again.  Pt also mentioned to me on the phone that she doesn't think she is taking Januvia , but not quite sure being she is in the ER at this time and not by her medications to check this at home.  She states when we call again tomorrow to go over all these instructions, she will be able to confirm at that time if this is on her medication list and pill box.  Pt is aware that I will go ahead and make the following med changes and get these to her pharmacy, and I will have another nurse reach out to her again tomorrow to go over all these changes again with her on the  phone.  Pt is also aware that I will route this message to Dr. Avanell Bob to make her aware that she is at [facility] ER right now for another fall.  Will also make Dr. Avanell Bob aware that pt is unsure if she is taking Januvia  or not.   Pt verbalized understanding and agrees with this plan.

## 2023-08-31 NOTE — ED Notes (Signed)
Spoke with lab about magnesium add-on.

## 2023-08-31 NOTE — Telephone Encounter (Signed)
-----   Message from Nurse Aneta Bar sent at 08/31/2023  9:13 AM EDT ----- I will try to call this pt later today, but just incase you get to it first, let me know. ----- Message ----- From: Elmyra Haggard, MD Sent: 08/29/2023   3:48 PM EDT To: Alanna Alley, RN  CBC is stable   Hgb is stable  Fluid number is still elevated even though clinically improved    Kidney function is relatively stable (1.3) I would make lasix  1.5 tabs in am and 1 in PM A1C is up  7.1    She is on januvia .   I would add Jardiance 10 mg    Follow up BMET, BNP in 10 days

## 2023-08-31 NOTE — Discharge Instructions (Signed)
 You were seen in the emerged meant for evaluation of head injury and facial bruising from a fall.  You had a CAT scan of your head face and neck that showed bilateral nasal bone fractures.  There were also some evidence of some old strokes.  Your EKG showed you to be having a lot of PVCs and blood work showed your magnesium  and potassium were low.  You were given magnesium  and potassium here.  Please continue ice to limit your facial bruising and swelling.  Follow-up with your primary care doctor for further discussion regarding your CAT scan findings.  Return to the emergency department if any worsening or concerning symptoms

## 2023-08-31 NOTE — ED Provider Notes (Signed)
 [place] EMERGENCY DEPARTMENT AT [facility] Provider Note   CSN: [id number] Arrival date & time: 08/31/23  1433     History  Chief Complaint  Patient presents with   Mallory Watts    Mallory Watts is a 83 y.o. female.  She has a history of A-fib on anticoagulation.  She said last night she was sitting in a chair when she slipped out and struck her head.  She denies that she lost consciousness though.  She said she has had multiple falls over the last few months and her doctors do not know why.  She had a nosebleed from the fall that stopped.  She feels baseline a little unsteady with her gait since February.  She called her doctor today regarding her fall and they recommended she come here for further evaluation.  No illness symptoms, no chest pain shortness of breath nausea vomiting diarrhea or urinary symptoms.  No focal numbness or weakness.  The history is provided by the patient.  Fall This is a recurrent problem. The current episode started yesterday. Pertinent negatives include no chest pain, no abdominal pain, no headaches and no shortness of breath. Nothing aggravates the symptoms. Nothing relieves the symptoms. She has tried a cold compress for the symptoms. The treatment provided mild relief.       Home Medications Prior to Admission medications   Medication Sig Start Date End Date Taking? Authorizing Provider  acetaminophen  (TYLENOL ) 650 MG CR tablet Take 1,300 mg by mouth 2 (two) times daily as needed for pain.    [provider]  alendronate  (FOSAMAX ) 70 MG tablet Take 70 mg by mouth every Saturday. Take with a full glass of water  on an empty stomach.    [provider]  alendronate  (FOSAMAX ) 70 MG tablet Take 1 tablet (70 mg total) by mouth once a week. 10/11/22     apixaban  (ELIQUIS ) 2.5 MG TABS tablet Take 1 tablet (2.5 mg total) by mouth 2 (two) times daily. 07/07/23   Ozell Blunt, MD  apixaban  (ELIQUIS ) 5 MG TABS tablet Take 1 tablet (5 mg  total) by mouth 2 (two) times daily. 02/14/23     atorvastatin  (LIPITOR) 80 MG tablet TAKE 1 TABLET BY MOUTH EVERY DAY 03/09/21   Elmyra Haggard, MD  atorvastatin  (LIPITOR) 80 MG tablet Take 1 tablet (80 mg total) by mouth daily. 05/15/23     Cholecalciferol (VITAMIN D-3 PO) Take 1 capsule by mouth at bedtime.    [provider]  cyanocobalamin  1000 MCG tablet Take 1 tablet (1,000 mcg total) by mouth daily. 07/08/23   Ozell Blunt, MD  dronedarone  (MULTAQ ) 400 MG tablet TAKE 1 TABLET (400 MG TOTAL) BY MOUTH 2 (TWO) TIMES DAILY WITH A MEAL. 10/03/22   Elmyra Haggard, MD  estradiol  (ESTRACE  VAGINAL) 0.1 MG/GM vaginal cream Place 1 g vaginally 3 (three) times a week. 02/17/23   Chrzanowski, Jami B, NP  ezetimibe  (ZETIA ) 10 MG tablet TAKE 1 TABLET BY MOUTH EVERY DAY 05/17/23   Elmyra Haggard, MD  ezetimibe  (ZETIA ) 10 MG tablet Take 1 tablet (10 mg total) by mouth daily. 05/17/23     fexofenadine (ALLEGRA) 180 MG tablet Take 180 mg by mouth daily as needed for allergies or rhinitis.    [provider]  furosemide  (LASIX ) 40 MG tablet Take 2 tablets (80 mg total) by mouth daily. 08/03/23   Elmyra Haggard, MD  Magnesium  250 MG TABS Take 250 mg by mouth daily as needed (  for leg cramps or constipation).    [provider]  meclizine (ANTIVERT) 25 MG tablet Take 25 mg by mouth 3 (three) times daily as needed for dizziness.    [provider]  metoprolol  succinate (TOPROL -XL) 25 MG 24 hr tablet Take 25 mg by mouth 2 (two) times daily.    [provider]  mometasone  (NASONEX ) 50 MCG/ACT nasal spray PLACE 2 SPRAYS INTO THE NOSE DAILY. Patient taking differently: Place 2 sprays into the nose daily as needed (for allergies). 12/27/21   Cobb, Mariah Shines, NP  neomycin -polymyxin b-dexamethasone  (MAXITROL ) 3.5-10000-0.1 OINT Place 1 Application into both eyes 2 (two) times daily as needed (AS DIRECTED).    [provider]  neomycin -polymyxin-dexameth (MAXITROL ) 0.1 % OINT Place  1 Application into both eyes daily as needed. 07/24/23   Florance Hun, MD  nitroGLYCERIN  (NITROSTAT ) 0.4 MG SL tablet PLACE 1 TABLET UNDER THE TONGUE EVERY 5 MINUTES AS NEEDED FOR CHEST PAIN. 12/29/21   Elmyra Haggard, MD  omeprazole (PRILOSEC) 40 MG capsule Take 40 mg by mouth daily before breakfast. 10/27/17   [provider]  potassium chloride  (MICRO-K ) 10 MEQ CR capsule Take 40 mEq by mouth daily.    [provider]  sitaGLIPtin  (JANUVIA ) 25 MG tablet Take 1 tablet (25 mg total) by mouth daily. 07/07/23   Ozell Blunt, MD  solifenacin  (VESICARE ) 5 MG tablet One daily as needed Patient taking differently: Take 5 mg by mouth daily. 07/21/14   Aleen Ammons, CNM  solifenacin  (VESICARE ) 5 MG tablet Take 1 tablet (5 mg total) by mouth daily. 01/19/23     TUMS ULTRA 1000 1000 MG chewable tablet Chew 500 mg by mouth 2 (two) times daily.    [provider]  valACYclovir  (VALTREX ) 500 MG tablet Take 1 tablet (500 mg total) by mouth daily. Take one tablet by mouth twice a day for 3 days as needed for an outbreak. Patient taking differently: Take 500 mg by mouth daily. 01/24/18   Greta Leatherwood, MD  zolpidem  (AMBIEN ) 10 MG tablet Take 5-10 mg by mouth at bedtime as needed for sleep. 01/13/23   [provider]      Allergies    Atenolol, Gadolinium, and Sulfonamide derivatives    Review of Systems   Review of Systems  Constitutional:  Negative for fever.  HENT:  Positive for nosebleeds.   Eyes:  Negative for visual disturbance.  Respiratory:  Negative for shortness of breath.   Cardiovascular:  Negative for chest pain.  Gastrointestinal:  Negative for abdominal pain.  Genitourinary:  Negative for dysuria.  Musculoskeletal:  Positive for gait problem (feel unsteady since feb).  Neurological:  Negative for speech difficulty, weakness, numbness and headaches.    Physical Exam Updated Vital Signs BP (!) 146/101 (BP Location: Left Arm)   Pulse 69    Temp 98.3 F (36.8 C)   Resp 16   LMP 04/11/1994   SpO2 96%  Physical Exam Vitals and nursing note reviewed.  Constitutional:      General: She is not in acute distress.    Appearance: Normal appearance. She is well-developed.  HENT:     Head: Normocephalic.     Comments: She has a bruise on her forehead and some bruising under both of her eyes.  Her nasal bridge is swollen and tender.  There is no active nosebleed. Eyes:     Conjunctiva/sclera: Conjunctivae normal.  Cardiovascular:     Rate and Rhythm: Normal  rate and regular rhythm.     Heart sounds: No murmur heard. Pulmonary:     Effort: Pulmonary effort is normal. No respiratory distress.     Breath sounds: Normal breath sounds. No stridor. No wheezing.  Abdominal:     Palpations: Abdomen is soft.     Tenderness: There is no abdominal tenderness. There is no guarding or rebound.  Musculoskeletal:        General: No tenderness or deformity. Normal range of motion.     Cervical back: Neck supple. No tenderness.  Skin:    General: Skin is warm and dry.  Neurological:     General: No focal deficit present.     Mental Status: She is alert and oriented to person, place, and time.     GCS: GCS eye subscore is 4. GCS verbal subscore is 5. GCS motor subscore is 6.     Motor: No weakness.     Gait: Gait normal.     ED Results / Procedures / Treatments   Labs (all labs ordered are listed, but only abnormal results are displayed) Labs Reviewed  COMPREHENSIVE METABOLIC PANEL WITH GFR - Abnormal; Notable for the following components:      Result Value   Potassium 3.4 (*)    BUN 31 (*)    Creatinine, Ser 1.47 (*)    GFR, Estimated 36 (*)    All other components within normal limits  CBC WITH DIFFERENTIAL/PLATELET - Abnormal; Notable for the following components:   Hemoglobin 10.3 (*)    HCT 33.5 (*)    MCV 79.8 (*)    MCH 24.5 (*)    RDW 21.8 (*)    All other components within normal limits  MAGNESIUM  - Abnormal;  Notable for the following components:   Magnesium  1.6 (*)    All other components within normal limits  URINALYSIS, ROUTINE W REFLEX MICROSCOPIC    EKG EKG Interpretation Date/Time:  Thursday Aug 31 2023 14:42:18 EDT Ventricular Rate:  69 PR Interval:  201 QRS Duration:  110 QT Interval:  484 QTC Calculation: 519 R Axis:   96  Text Interpretation: Sinus rhythm Ventricular trigeminy Right axis deviation Prolonged QT interval Confirmed by Racheal Buddle [phone]) on 08/31/2023 3:26:00 PM  Radiology CT Head Wo Contrast Result Date: 08/31/2023 CLINICAL DATA:  Fall facial trauma EXAM: CT HEAD WITHOUT CONTRAST CT MAXILLOFACIAL WITHOUT CONTRAST CT CERVICAL SPINE WITHOUT CONTRAST TECHNIQUE: Multidetector CT imaging of the head, cervical spine, and maxillofacial structures were performed using the standard protocol without intravenous contrast. Multiplanar CT image reconstructions of the cervical spine and maxillofacial structures were also generated. RADIATION DOSE REDUCTION: This exam was performed according to the departmental dose-optimization program which includes automated exposure control, adjustment of the mA and/or kV according to patient size and/or use of iterative reconstruction technique. COMPARISON:  CT brain and cervical spine 05/17/2023, CT head cervical spine and facial bones 04/09/2023 FINDINGS: CT HEAD FINDINGS Brain: No acute territorial infarction, hemorrhage or intracranial mass. 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 Atrophy and chronic small vessel ischemic changes of the white matter. Stable ventricle size. Vascular: No hyperdense vessels. Vertebral and carotid vascular calcification Skull: Normal. Negative for fracture or focal lesion. Other: Small right forehead scalp swelling. CT MAXILLOFACIAL FINDINGS Osseous: Mastoid air cells are clear. Mandibular heads are normally position. No acute mandibular fracture. Pterygoid plates and zygomatic arches are intact. Acute  mildly displaced bilateral nasal bone fractures. Orbits: Negative. No traumatic or inflammatory finding. Sinuses: No acute sinus wall  fracture. Mild mucosal thickening in the sinuses. No air-fluid levels. Soft tissues: Soft tissue swelling over the nasal area and right forehead CT CERVICAL SPINE FINDINGS Alignment: No subluxation.  Facet alignment is within normal limits. Skull base and vertebrae: No acute fracture. No primary bone lesion or focal pathologic process. Soft tissues and spinal canal: No prevertebral fluid or swelling. No visible canal hematoma. Disc levels: Anterior fusion hardware C4 through C6. Partial ankylosis C3-C4. Advanced disc space narrowing C2-C3, C6-C7 and C7-T1. facet degenerative changes at multiple levels Upper chest: Negative. Other: None IMPRESSION: 1. No CT evidence for acute intracranial abnormality. Atrophy, chronic small vessel ischemic changes of the white matter, and chronic right frontal and left parietal infarcts. 2. Acute mildly displaced bilateral nasal bone fractures. Soft tissue swelling over the nasal area and right forehead. 3. Anterior fusion hardware C4 through C6. No acute osseous abnormality of the cervical spine. Electronically Signed   By: Esmeralda Hedge M.D.   On: 08/31/2023 17:36   CT Maxillofacial WO CM Result Date: 08/31/2023 CLINICAL DATA:  Fall facial trauma EXAM: CT HEAD WITHOUT CONTRAST CT MAXILLOFACIAL WITHOUT CONTRAST CT CERVICAL SPINE WITHOUT CONTRAST TECHNIQUE: Multidetector CT imaging of the head, cervical spine, and maxillofacial structures were performed using the standard protocol without intravenous contrast. Multiplanar CT image reconstructions of the cervical spine and maxillofacial structures were also generated. RADIATION DOSE REDUCTION: This exam was performed according to the departmental dose-optimization program which includes automated exposure control, adjustment of the mA and/or kV according to patient size and/or use of iterative  reconstruction technique. COMPARISON:  CT brain and cervical spine 05/17/2023, CT head cervical spine and facial bones 04/09/2023 FINDINGS: CT HEAD FINDINGS Brain: No acute territorial infarction, hemorrhage or intracranial mass. 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 Atrophy and chronic small vessel ischemic changes of the white matter. Stable ventricle size. Vascular: No hyperdense vessels. Vertebral and carotid vascular calcification Skull: Normal. Negative for fracture or focal lesion. Other: Small right forehead scalp swelling. CT MAXILLOFACIAL FINDINGS Osseous: Mastoid air cells are clear. Mandibular heads are normally position. No acute mandibular fracture. Pterygoid plates and zygomatic arches are intact. Acute mildly displaced bilateral nasal bone fractures. Orbits: Negative. No traumatic or inflammatory finding. Sinuses: No acute sinus wall fracture. Mild mucosal thickening in the sinuses. No air-fluid levels. Soft tissues: Soft tissue swelling over the nasal area and right forehead CT CERVICAL SPINE FINDINGS Alignment: No subluxation.  Facet alignment is within normal limits. Skull base and vertebrae: No acute fracture. No primary bone lesion or focal pathologic process. Soft tissues and spinal canal: No prevertebral fluid or swelling. No visible canal hematoma. Disc levels: Anterior fusion hardware C4 through C6. Partial ankylosis C3-C4. Advanced disc space narrowing C2-C3, C6-C7 and C7-T1. facet degenerative changes at multiple levels Upper chest: Negative. Other: None IMPRESSION: 1. No CT evidence for acute intracranial abnormality. Atrophy, chronic small vessel ischemic changes of the white matter, and chronic right frontal and left parietal infarcts. 2. Acute mildly displaced bilateral nasal bone fractures. Soft tissue swelling over the nasal area and right forehead. 3. Anterior fusion hardware C4 through C6. No acute osseous abnormality of the cervical spine. Electronically Signed   By:  Esmeralda Hedge M.D.   On: 08/31/2023 17:36   CT Cervical Spine Wo Contrast Result Date: 08/31/2023 CLINICAL DATA:  Fall facial trauma EXAM: CT HEAD WITHOUT CONTRAST CT MAXILLOFACIAL WITHOUT CONTRAST CT CERVICAL SPINE WITHOUT CONTRAST TECHNIQUE: Multidetector CT imaging of the head, cervical spine, and maxillofacial structures were performed using  the standard protocol without intravenous contrast. Multiplanar CT image reconstructions of the cervical spine and maxillofacial structures were also generated. RADIATION DOSE REDUCTION: This exam was performed according to the departmental dose-optimization program which includes automated exposure control, adjustment of the mA and/or kV according to patient size and/or use of iterative reconstruction technique. COMPARISON:  CT brain and cervical spine 05/17/2023, CT head cervical spine and facial bones 04/09/2023 FINDINGS: CT HEAD FINDINGS Brain: No acute territorial infarction, hemorrhage or intracranial mass. 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 Atrophy and chronic small vessel ischemic changes of the white matter. Stable ventricle size. Vascular: No hyperdense vessels. Vertebral and carotid vascular calcification Skull: Normal. Negative for fracture or focal lesion. Other: Small right forehead scalp swelling. CT MAXILLOFACIAL FINDINGS Osseous: Mastoid air cells are clear. Mandibular heads are normally position. No acute mandibular fracture. Pterygoid plates and zygomatic arches are intact. Acute mildly displaced bilateral nasal bone fractures. Orbits: Negative. No traumatic or inflammatory finding. Sinuses: No acute sinus wall fracture. Mild mucosal thickening in the sinuses. No air-fluid levels. Soft tissues: Soft tissue swelling over the nasal area and right forehead CT CERVICAL SPINE FINDINGS Alignment: No subluxation.  Facet alignment is within normal limits. Skull base and vertebrae: No acute fracture. No primary bone lesion or focal pathologic  process. Soft tissues and spinal canal: No prevertebral fluid or swelling. No visible canal hematoma. Disc levels: Anterior fusion hardware C4 through C6. Partial ankylosis C3-C4. Advanced disc space narrowing C2-C3, C6-C7 and C7-T1. facet degenerative changes at multiple levels Upper chest: Negative. Other: None IMPRESSION: 1. No CT evidence for acute intracranial abnormality. Atrophy, chronic small vessel ischemic changes of the white matter, and chronic right frontal and left parietal infarcts. 2. Acute mildly displaced bilateral nasal bone fractures. Soft tissue swelling over the nasal area and right forehead. 3. Anterior fusion hardware C4 through C6. No acute osseous abnormality of the cervical spine. Electronically Signed   By: Esmeralda Hedge M.D.   On: 08/31/2023 17:36    Procedures Procedures    Medications Ordered in ED Medications  potassium chloride  SA (KLOR-CON  M) CR tablet 40 mEq (40 mEq Oral Given 08/31/23 1704)  magnesium  sulfate IVPB 2 g 50 mL (0 g Intravenous Stopped 08/31/23 1836)    ED Course/ Medical Decision Making/ A&P                                 Medical Decision Making Amount and/or Complexity of Data Reviewed Labs: ordered. Radiology: ordered.  Risk Prescription drug management.   This patient complains of fall unsteady gait facial injury; this involves an extensive number of treatment Options and is a complaint that carries with it a high risk of complications and morbidity. The differential includes stroke, bleed, fracture, contusion, dehydration, metabolic derangement, arrhythmia  I ordered, reviewed and interpreted labs, which included CBC with normal white count stable low hemoglobin, chemistries with mildly low potassium elevated BUN and creatinine, magnesium  low, urinalysis without signs of infection I ordered medication oral potassium IV magnesium  and reviewed PMP when indicated. I ordered imaging studies which included head face and cervical spine CT  and I independently    visualized and interpreted imaging which showed no acute stroke.  Does have signs of old stroke, bilateral nasal fractures, degenerative changes in spine Additional history obtained from patient's companion Previous records obtained and reviewed in epic, 3 recent visits over the last 6 months for falls Cardiac monitoring  reviewed, sinus rhythm with frequent PVCs Social determinants considered, no significant barriers Critical Interventions: None  After the interventions stated above, I reevaluated the patient and found patient to be symptomatically improved and ambulatory in the department.  Having less ectopy on the monitor. Admission and further testing considered, feel she is safe for outpatient follow-up with her PCP.  She is comfortable plan for discharge and outpatient follow-up.  Return instructions discussed         Final Clinical Impression(s) / ED Diagnoses Final diagnoses:  Fall, initial encounter  Traumatic injury of head, initial encounter  Closed fracture of nasal bone, initial encounter  PVC (premature ventricular contraction)  Hypomagnesemia  Hypokalemia    Rx / DC Orders ED Discharge Orders     None         Tonya Fredrickson, MD 09/01/23 [phone]

## 2023-09-06 ENCOUNTER — Ambulatory Visit
Admission: RE | Admit: 2023-09-06 | Discharge: 2023-09-06 | Disposition: A | Discharge status: Home / Self Care | Source: Ambulatory Visit | Attending: Hematology and Oncology

## 2023-09-06 ENCOUNTER — Ambulatory Visit

## 2023-09-06 DIAGNOSIS — N644 Mastodynia: Secondary | ICD-10-CM

## 2023-09-07 ENCOUNTER — Other Ambulatory Visit (HOSPITAL_COMMUNITY): Payer: Self-pay

## 2023-10-02 ENCOUNTER — Other Ambulatory Visit: Payer: Self-pay | Admitting: Internal Medicine

## 2023-10-02 DIAGNOSIS — I509 Heart failure, unspecified: Secondary | ICD-10-CM

## 2023-10-02 DIAGNOSIS — R7989 Other specified abnormal findings of blood chemistry: Secondary | ICD-10-CM

## 2023-10-02 DIAGNOSIS — Z79899 Other long term (current) drug therapy: Secondary | ICD-10-CM

## 2023-11-07 ENCOUNTER — Other Ambulatory Visit (HOSPITAL_COMMUNITY): Payer: Self-pay

## 2023-11-08 ENCOUNTER — Other Ambulatory Visit: Payer: Self-pay

## 2023-11-13 ENCOUNTER — Other Ambulatory Visit: Payer: Self-pay | Admitting: Internal Medicine

## 2023-11-13 ENCOUNTER — Other Ambulatory Visit: Payer: Self-pay

## 2023-11-13 ENCOUNTER — Other Ambulatory Visit (HOSPITAL_COMMUNITY): Payer: Self-pay

## 2023-11-13 MED ORDER — ELIQUIS 2.5 MG PO TABS
2.5000 mg | ORAL_TABLET | Freq: Two times a day (BID) | ORAL | 1 refills | Status: AC
  Filled 2023-11-13: qty 60, 30d supply, fill #0
  Filled 2023-12-13 – 2023-12-14 (×2): qty 60, 30d supply, fill #1

## 2023-11-13 MED ORDER — TRELEGY ELLIPTA 100-62.5-25 MCG/ACT IN AEPB
INHALATION_SPRAY | RESPIRATORY_TRACT | 12 refills | Status: DC
  Filled 2023-11-13 – 2023-12-18 (×3): qty 60, 30d supply, fill #0

## 2023-11-13 MED ORDER — OMEPRAZOLE 40 MG PO CPDR
40.0000 mg | DELAYED_RELEASE_CAPSULE | Freq: Every day | ORAL | 4 refills | Status: AC
  Filled 2023-11-13: qty 30, 30d supply, fill #0
  Filled 2023-12-13 – 2023-12-14 (×2): qty 30, 30d supply, fill #1

## 2023-11-13 MED ORDER — JARDIANCE 10 MG PO TABS
10.0000 mg | ORAL_TABLET | Freq: Every day | ORAL | 3 refills | Status: DC
  Filled 2023-11-13: qty 30, 30d supply, fill #0
  Filled 2023-12-13 – 2023-12-18 (×3): qty 30, 30d supply, fill #1

## 2023-11-13 MED ORDER — FUROSEMIDE 40 MG PO TABS
ORAL_TABLET | ORAL | 3 refills | Status: AC
Start: 2023-10-02 — End: ?
  Filled 2023-11-13: qty 45, 30d supply, fill #0
  Filled 2023-12-13 – 2023-12-14 (×2): qty 45, 30d supply, fill #1

## 2023-11-13 MED ORDER — METOPROLOL SUCCINATE ER 25 MG PO TB24: 25.0000 mg | ORAL_TABLET | Freq: Two times a day (BID) | ORAL | 2 refills | Status: AC

## 2023-11-14 ENCOUNTER — Other Ambulatory Visit: Payer: Self-pay

## 2023-11-14 ENCOUNTER — Other Ambulatory Visit (HOSPITAL_COMMUNITY): Payer: Self-pay

## 2023-11-15 ENCOUNTER — Other Ambulatory Visit: Payer: Self-pay

## 2023-11-16 ENCOUNTER — Other Ambulatory Visit: Payer: Self-pay

## 2023-11-17 ENCOUNTER — Other Ambulatory Visit: Payer: Self-pay

## 2023-11-17 ENCOUNTER — Other Ambulatory Visit (HOSPITAL_COMMUNITY): Payer: Self-pay

## 2023-11-20 ENCOUNTER — Other Ambulatory Visit: Payer: Self-pay

## 2023-11-24 ENCOUNTER — Other Ambulatory Visit (HOSPITAL_COMMUNITY): Payer: Self-pay

## 2023-11-25 ENCOUNTER — Other Ambulatory Visit (HOSPITAL_COMMUNITY): Payer: Self-pay

## 2023-11-27 NOTE — Progress Notes (Unsigned)
 Cardiology Office Note   Date:  11/28/2023  ID:  Mallory Watts, DOB [birth year], MRN [id number]  PCP:  Mallory Rush, MD  Cardiologist:   Mallory Gull, MD   F/U of HFpEF and PAF  History of Present Illness: Mallory Watts is a 83 y.o. female with a history of CAD (status post CABG in 2004), stress myoview  in 2023 showed no ischemia), PAF, central sleep apnea,.      The pt wore a 14 day monitor in APril 2023 which showed atrial fibrillation 100% time   HR ranges 44 to 187 bpm   Average HR 79 bpm  I saw her after and placed her on Multaq    One day, after starting Multaq , she said she felt better  EKG at next visit showed SR    Pt with severe sleep apnea  On BiPAP.    PFTs show mod decreased DLCO  July 2024  Lexiscan  PET / CT   Normal  Jan 2025  Seen by Mallory Watts   More SOB and with LE edema  Rx extra lasix  for a few days  Echo in Feb 2025 showed LVEF normal   Mod TR, Mod MR, Mod AI APril 2025  Increased She did better earielt this spring     Says she is eating more at home    I saw the pt in May 2025  Since seen she has had more problems with swelling in legs   She is taking lasix  as directed   Admits to eating out a lot    Denies CP   Breathing is short  Using inhaler   Has no energy    Denies palpitations  This morning rolled out of bed  No injury.  Hired a helper at home   Doesn;t have energy to do it   Current Meds  Medication Sig   acetaminophen  (TYLENOL ) 650 MG CR tablet Take 1,300 mg by mouth 2 (two) times daily as needed for pain.   alendronate  (FOSAMAX ) 70 MG tablet Take 1 tablet (70 mg total) by mouth once a week.   apixaban  (ELIQUIS ) 2.5 MG TABS tablet Take 1 tablet (2.5 mg total) by mouth in the morning and at bedtime.   atorvastatin  (LIPITOR) 80 MG tablet Take 1 tablet (80 mg total) by mouth daily.   Cholecalciferol (VITAMIN D-3 PO) Take 1 capsule by mouth at bedtime.   cyanocobalamin  1000 MCG tablet Take 1 tablet (1,000 mcg total) by mouth daily.   empagliflozin   (JARDIANCE ) 10 MG TABS tablet Take 1 tablet (10 mg total) by mouth daily before breakfast.   estradiol  (ESTRACE  VAGINAL) 0.1 MG/GM vaginal cream Place 1 g vaginally 3 (three) times a week.   ezetimibe  (ZETIA ) 10 MG tablet Take 1 tablet (10 mg total) by mouth daily.   fexofenadine (ALLEGRA) 180 MG tablet Take 180 mg by mouth daily as needed for allergies or rhinitis.   Fluticasone -Umeclidin-Vilant (TRELEGY ELLIPTA ) 100-62.5-25 MCG/ACT AEPB Inhale 1 puff by mouth into the lungs then rinse mouth once daily   furosemide  (LASIX ) 40 MG tablet Take 1.5 tablets by mouth in the morning, then take 1 tablet by mouth in the evening.   Magnesium  250 MG TABS Take 250 mg by mouth daily as needed (for leg cramps or constipation).   meclizine (ANTIVERT) 25 MG tablet Take 25 mg by mouth 3 (three) times daily as needed for dizziness.   metoprolol  succinate (TOPROL -XL) 25 MG 24 hr tablet Take 1 tablet (25 mg total) by mouth  2 (two) times daily.   mometasone  (NASONEX ) 50 MCG/ACT nasal spray PLACE 2 SPRAYS INTO THE NOSE DAILY. (Patient taking differently: Place 2 sprays into the nose daily as needed (for allergies).)   neomycin -polymyxin b-dexamethasone  (MAXITROL ) 3.5-10000-0.1 OINT Place 1 Application into both eyes 2 (two) times daily as needed (AS DIRECTED).   neomycin -polymyxin-dexameth (MAXITROL ) 0.1 % OINT Place 1 Application into both eyes daily as needed.   nitroGLYCERIN  (NITROSTAT ) 0.4 MG SL tablet PLACE 1 TABLET UNDER THE TONGUE EVERY 5 MINUTES AS NEEDED FOR CHEST PAIN.   omeprazole  (PRILOSEC) 40 MG capsule Take 1 capsule (40 mg total) by mouth daily.   potassium chloride  (MICRO-K ) 10 MEQ CR capsule Take 40 mEq by mouth daily.   sitaGLIPtin  (JANUVIA ) 25 MG tablet Take 1 tablet (25 mg total) by mouth daily.   solifenacin  (VESICARE ) 5 MG tablet One daily as needed (Patient taking differently: Take 5 mg by mouth daily.)   TUMS ULTRA 1000 1000 MG chewable tablet Chew 500 mg by mouth 2 (two) times daily.    valACYclovir  (VALTREX ) 500 MG tablet Take 1 tablet (500 mg total) by mouth daily. Take one tablet by mouth twice a day for 3 days as needed for an outbreak. (Patient taking differently: Take 500 mg by mouth daily.)   zolpidem  (AMBIEN ) 10 MG tablet Take 5-10 mg by mouth at bedtime as needed for sleep.   [DISCONTINUED] dronedarone  (MULTAQ ) 400 MG tablet TAKE 1 TABLET (400 MG TOTAL) BY MOUTH 2 (TWO) TIMES DAILY WITH A MEAL.       Allergies:   Atenolol, Gadolinium, and Sulfonamide derivatives   Past Medical History:  Diagnosis Date   Arthritis    Atrial fibrillation (HCC)    not symptomatic -on metoprolol  BID for this    CAD (coronary artery disease)    s/p [facility] 2004 at [facility]   Cancer [facility]) 1998   breast cancer--left   Dysrhythmia    atrial flutter/fib   GERD (gastroesophageal reflux disease)    Hypertension    Osteopenia    Personal history of chemotherapy 1998   Personal history of radiation therapy 1998   PONV (postoperative nausea and vomiting)    Shortness of breath    exertion   STD (sexually transmitted disease)    HSV   Urinary incontinence     Past Surgical History:  Procedure Laterality Date   ANTERIOR CERVICAL DECOMP/DISCECTOMY FUSION N/A 01/14/2014   Procedure: ANTERIOR CERVICAL DECOMPRESSION/DISCECTOMY FUSION CERVICAL FOUR-FIVE,CERVICAL FIVE SIX;  Surgeon: Victory Gens, MD;  Location: [facility];  Service: Neurosurgery;  Laterality: N/A;   BREAST EXCISIONAL BIOPSY Left    BREAST LUMPECTOMY Left 1998   BREAST SURGERY  1998   left lumpectomy for breast ca   COLONOSCOPY     CORONARY ARTERY BYPASS GRAFT  2004   at [facility]   DILATION AND CURETTAGE OF UTERUS  2006   heart bypass  2003   -single   POLYPECTOMY     TONSILLECTOMY     TOTAL KNEE ARTHROPLASTY Left 11/28/2016   Procedure: LEFT TOTAL KNEE ARTHROPLASTY;  Surgeon: Melodi Lerner, MD;  Location: [facility] ORS;  Service: Orthopedics;  Laterality: Left;  Canal block     Social History:  The patient  reports that she  has quit smoking. Her smoking use included cigarettes. She has never used smokeless tobacco. She reports current alcohol  use of about 1.0 standard drink of alcohol  per week. She reports that she does not use drugs.   Family History:  The patient's  family history includes Diabetes in her brother and mother; Stroke in her father and mother.    ROS:  Please see the history of present illness. All other systems are reviewed and  Negative to the above problem except as noted.    PHYSICAL EXAM: VS:  BP 132/70   Pulse 68   Ht 5' 2 (1.575 m)   Wt 175 lb 12.8 oz (79.7 kg)   LMP 04/11/1994   SpO2 97%   BMI 32.15 kg/m   GEN: Obese 83 yo  in no acute distress HEENT: normal  Neck: JVP is normal Cardiac: RRR  II/VI systolic murmur Respiratory:  clear to auscultation No rales  GI: soft, nontender Obese  No hepatomegaly  Ext  2+ LE edema Blister on R calf   EKG:  EKG is ordered today.  SR 62 bpm    Cardiac studies     Monitor May 2025  mpression:   Sinus rhythm   Rates 56 to 103 bpm  Average HR 72 bpm    Rare PACs, Frequent PVCs  (11.4% total) 7 episodes SVT  fastest for 9 beats at 130 bpm, longest for 18 seconds at 103 bpm   Diary entries/triggered events corresponded to SR with PVCs    Echo  Feb 2025   1. Left ventricular ejection fraction, by estimation, is 50 to 55% . The left ventricle has low normal function. The left ventricle has no regional wall motion abnormalities. Left ventricular diastolic parameters are indeterminate.  2. Right ventricular systolic function is low normal. The right ventricular size is mildly enlarged. There is normal pulmonary artery systolic pressure. The estimated right ventricular systolic pressure is 31. 1 mmHg.  3. Left atrial size was moderately dilated.  4. Right atrial size was moderately dilated.  5. The mitral valve is degenerative. Moderate mitral valve regurgitation. Moderate mitral annular calcification.  6. Tricuspid valve regurgitation is  moderate. \ 7. The aortic valve was not well visualized. Aortic valve regurgitation is moderate. Aortic regurgitation PHT measures 350 msec.  8. There is moderate dilatation of the ascending aorta, measuring 39 mm.  9. The inferior vena cava is normal in size with greater than 50% respiratory variability, suggesting right atrial pressure of 3 mmHg.   NM/PET  July 2024   The study is normal. The study is low risk.   LV perfusion is normal. There is no evidence of ischemia. There is no evidence of infarction.   No ST deviation was noted. Arrhythmias during stress: occasional PVCs. Arrhythmias during recovery: occasional PVCs.   Rest left ventricular function is normal. Rest EF: 59 %. Stress left ventricular function is normal. Stress EF: 63 %. End diastolic cavity size is normal. End systolic cavity size is normal. No evidence of transient ischemic dilation (TID) noted.   Myocardial blood flow was computed to be 0.22ml/g/min at rest and 1.94ml/g/min at stress. Global myocardial blood flow reserve was 2.43 and was normal.   Coronary calcium  assessment not performed due to prior revascularization.    Event monitor    07/2021  Afib 100% time    Echo  07/23/21  Left ventricular ejection fraction, by estimation, is 60 to 65%. The left ventricle has normal function. The left ventricle has no regional wall motion abnormalities. There is mild left ventricular hypertrophy. Left ventricular diastolic parameters are indeterminate. 1. Right ventricular systolic function is normal. The right ventricular size is normal. There is normal pulmonary artery systolic pressure. 2. 3. Left atrial size was moderately dilated.  4. Right atrial size was severely dilated. The mitral valve is degenerative. Mild mitral valve regurgitation. Moderate mitral annular calcification. 5. 6. Tricuspid valve regurgitation is mild to moderate. 7. The aortic valve was not well visualized. Aortic valve regurgitation is mild  to moderate. The inferior vena cava is normal in size with greater than 50% respiratory variability, suggesting right atrial pressure of 3 mmHg. 8. Comparison(s): No significant change from prior study.  Myoview    06/2021    ECG is abnormal. ECG rhythm shows atrial fibrillation. The ECG shows premature ventricular contractions.   Breast attenuation artifact was present.   LV perfusion is abnormal. There is no evidence of ischemia. There is no evidence of infarction. There is normal wall motion in the defect area. Consistent with artifact caused by breast attenuation.   Left ventricular function is normal. Nuclear stress EF: calculated at 47 % but visually appears at least 50-55%. The left ventricular ejection fraction is mildly decreased (45-54%). End diastolic cavity size is normal. End systolic cavity size is normal.   Prior study not available for comparison.   The study is normal. The study is low risk.   Consider 2D echo to assess EF further.   Lipid Panel    Component Value Date/Time   CHOL 115 08/31/2021 1216   TRIG 111 08/31/2021 1216   HDL 40 08/31/2021 1216   CHOLHDL 2.9 08/31/2021 1216   LDLCALC 55 08/31/2021 1216      Wt Readings from Last 3 Encounters:  11/28/23 175 lb 12.8 oz (79.7 kg)  08/28/23 171 lb 9.6 oz (77.8 kg)  08/01/23 175 lb 9.6 oz (79.7 kg)      ASSESSMENT AND PLAN:   1 HFpEF       Recent echo LVEF and RVEF normal   Mod TR and MR At last visit lasix  increased  She is clinically improved though still with some edema I would recheck labs today  for dose adjustment Again, stresssed watching ingredients to food  LImit sodium    Adjustments will be based on test results   2  CAD Recent PET/CT LExiscan  was normal     Pt denies CP     3  Presyncope  Pt denies dizziness   Rolling out of bed occurred before she got up  Told her to get bed rail  4  PAF  PT has been maintaining SR on Multaq   With continued volume overload though would discontinue (she  responded to lasix  in past but has declined) She did not feel good when she was in afib   Will need to follow     5 Valvular dz.  Pt with moderate MR, TR and AI          6.  dyslipidemia Keep on lipitor  LDL 59  HDL 46  Trig 73     7.  Sleep apnea Uses BiPAP on and off  8  DM  REcent A1C bettter at 6.8    She may ask help to help out with cooking, not eat out so much   Sounds like a good idea  Will set to see the pt in the fall    Current medicines are reviewed at length with the patient today.  The patient does not have concerns regarding medicines.  Signed, Mallory Gull, MD   [facility]

## 2023-11-28 ENCOUNTER — Encounter: Payer: Self-pay | Admitting: Internal Medicine

## 2023-11-28 ENCOUNTER — Ambulatory Visit: Attending: Internal Medicine | Admitting: Internal Medicine

## 2023-11-28 VITALS — BP 132/70 | HR 68 | Ht 62.0 in | Wt 175.8 lb

## 2023-11-28 DIAGNOSIS — I2510 Atherosclerotic heart disease of native coronary artery without angina pectoris: Secondary | ICD-10-CM

## 2023-11-28 DIAGNOSIS — Z79899 Other long term (current) drug therapy: Secondary | ICD-10-CM | POA: Diagnosis not present

## 2023-11-28 DIAGNOSIS — I4892 Unspecified atrial flutter: Secondary | ICD-10-CM | POA: Diagnosis not present

## 2023-11-28 DIAGNOSIS — I10 Essential (primary) hypertension: Secondary | ICD-10-CM

## 2023-11-28 NOTE — Patient Instructions (Signed)
 Medication Instructions:  Your physician has recommended you make the following change in your medication:   1) STOP dronedarone  (Multaq )  *If you need a refill on your cardiac medications before your next appointment, please call your pharmacy*  Lab Work: TODAY: BMET, BNP, CBC If you have labs (blood work) drawn today and your tests are completely normal, you will receive your results only by: MyChart Message (if you have MyChart) OR A paper copy in the mail If you have any lab test that is abnormal or we need to change your treatment, we will call you to review the results.  Follow-Up: At [facility], you and your health needs are our priority.  As part of our continuing mission to provide you with exceptional heart care, our providers are all part of one team.  This team includes your primary Cardiologist (physician) and Advanced Practice Providers or APPs (Physician Assistants and Nurse Practitioners) who all work together to provide you with the care you need, when you need it.  Your next appointment:   3 month(s)  Provider:   Vina Gull, MD or One of our Advanced Practice Providers (APPs): Morse Clause, PA-C  Lamarr Satterfield, NP Miriam Shams, NP  Olivia Pavy, PA-C Josefa Beauvais, NP  Leontine Salen, PA-C Orren Fabry, PA-C  [place], PA-C Ernest Dick, NP  Damien Braver, NP Jon Hails, PA-C  Waddell Donath, PA-C    Dayna Dunn, PA-C  Scott Weaver, PA-C Lum Louis, NP Katlyn West, NP Callie Goodrich, PA-C  Evan Williams, PA-C Sheng Haley, PA-C  Xika Zhao, NP Kathleen Andre Gallego, PA-C   We recommend signing up for the patient portal called MyChart.  Sign up information is provided on this After Visit Summary.  MyChart is used to connect with patients for Virtual Visits (Telemedicine).  Patients are able to view lab/test results, encounter notes, upcoming appointments, etc.  Non-urgent messages can be sent to your provider as well.    To learn more about what you  can do with MyChart, go to [email or web address].

## 2023-11-29 ENCOUNTER — Ambulatory Visit: Payer: Self-pay

## 2023-11-29 DIAGNOSIS — I10 Essential (primary) hypertension: Secondary | ICD-10-CM

## 2023-11-29 DIAGNOSIS — R609 Edema, unspecified: Secondary | ICD-10-CM

## 2023-11-29 LAB — CBC
Hematocrit: 34.5 % (ref 34.0–46.6)
Hemoglobin: 10.3 g/dL — ABNORMAL LOW (ref 11.1–15.9)
MCH: 25.8 pg — ABNORMAL LOW (ref 26.6–33.0)
MCHC: 29.9 g/dL — ABNORMAL LOW (ref 31.5–35.7)
MCV: 87 fL (ref 79–97)
Platelets: 249 x10E3/uL (ref 150–450)
RBC: 3.99 x10E6/uL (ref 3.77–5.28)
RDW: 18.2 % — ABNORMAL HIGH (ref 11.7–15.4)
WBC: 7.9 x10E3/uL (ref 3.4–10.8)

## 2023-11-29 LAB — BASIC METABOLIC PANEL WITH GFR
BUN/Creatinine Ratio: 21 (ref 12–28)
BUN: 25 mg/dL (ref 8–27)
CO2: 20 mmol/L (ref 20–29)
Calcium: 8.8 mg/dL (ref 8.7–10.3)
Chloride: 107 mmol/L — AB (ref 96–106)
Creatinine, Ser: 1.18 mg/dL — AB (ref 0.57–1.00)
Glucose: 97 mg/dL (ref 70–99)
Potassium: 3.9 mmol/L (ref 3.5–5.2)
Sodium: 142 mmol/L (ref 134–144)
eGFR: 47 mL/min/1.73 — AB (ref >59)

## 2023-11-29 LAB — PRO B NATRIURETIC PEPTIDE: NT-Pro BNP: 4121 pg/mL — AB (ref 0–738)

## 2023-12-01 ENCOUNTER — Other Ambulatory Visit: Payer: Self-pay

## 2023-12-01 DIAGNOSIS — R609 Edema, unspecified: Secondary | ICD-10-CM

## 2023-12-01 DIAGNOSIS — I10 Essential (primary) hypertension: Secondary | ICD-10-CM

## 2023-12-01 NOTE — Addendum Note (Signed)
 Addended by: GORDON RONAL SQUIBB on: 12/01/2023 03:18 PM   Modules accepted: Orders

## 2023-12-13 ENCOUNTER — Other Ambulatory Visit (HOSPITAL_COMMUNITY): Payer: Self-pay

## 2023-12-13 ENCOUNTER — Other Ambulatory Visit: Payer: Self-pay

## 2023-12-13 MED ORDER — SOLIFENACIN SUCCINATE 5 MG PO TABS
5.0000 mg | ORAL_TABLET | Freq: Every day | ORAL | 3 refills | Status: DC
  Filled 2023-12-14 – 2023-12-18 (×3): qty 30, 30d supply, fill #0

## 2023-12-14 ENCOUNTER — Other Ambulatory Visit: Payer: Self-pay

## 2023-12-14 ENCOUNTER — Other Ambulatory Visit (HOSPITAL_COMMUNITY): Payer: Self-pay

## 2023-12-15 ENCOUNTER — Other Ambulatory Visit: Payer: Self-pay

## 2023-12-18 ENCOUNTER — Other Ambulatory Visit: Payer: Self-pay

## 2023-12-18 ENCOUNTER — Other Ambulatory Visit (HOSPITAL_COMMUNITY): Payer: Self-pay

## 2023-12-19 ENCOUNTER — Other Ambulatory Visit (HOSPITAL_COMMUNITY): Payer: Self-pay

## 2023-12-20 ENCOUNTER — Other Ambulatory Visit: Payer: Self-pay

## 2023-12-24 NOTE — Progress Notes (Unsigned)
 64 yoF previously seen by Dr Shellia and Hope, NP for OSA on BIPAP  Obstructive Sleep Apnea Patient reports BiPAP machine may be shutting off during the night. Patient is compliant with BiPAP use but is experiencing residual apneic events on current pressure setting 17/13cm h20. Previous pressure adjustment did not improve apnea score. -Contact [facility] to service BiPAP machine. -If no improvement, needs repeat sleep lab titration study. -Renew CPAP supplies with DME   Acute Cough New onset cough with phlegm production 2 weeks, similar to previous episode that resolved with prednisone . No associated fever, shortness of breath, chest tightenss, PND or reflux symptoms. Lungs clear on exam. No significant findings on pulmonary function tests except decreased diffusion defect which normalized when correlated for lung volumes. -Trial of Robitussin DM for symptomatic relief. Notify office if no improvement, may consider as needed ICS/SABA   BiPAP Supplies Patient reports not receiving new supplies since November. -Renew BiPAP supplies, specifically the hose.   Insomnia Patient recently switched from Lunesta to Ambien  10mg  for sleep, which has improved sleep quality  -Continue Ambien  10 ----------------------------------------------------------------- 05/16/23- 82 yoF former smoker with hx OSA on BIPAP, Insomnia, Acute Bronchitis, complicated by pAFib/ Eliquis , mild to moderate AVR/TR,  HTN, CAD, CHF, GERD/stricture, hx L Br Ca,  -Ambien  10, Nasonex ,  BIPAP 17/13/ [facility]    AirCurve10 VAuto Download compliance-    up til Dec 15, AHI 22.5-                                              may need to change to autoBIPAP (EPAP 10-20, PS 4) Body weight today-177 lbs Previously followed by [facility] Pending ECHO per cardiology. Recent Cardiac PET w/o ischemia or scar. Tried increasing lasix  for peripheral edema. -----Has not worn Bipap since November, she had coughing and sneezing and could not wear mask.  She  has been sick. She has been on antibiotics/prednisone  and it is still not resolved.  Cough is just getting a little better. CXR 04/02/23 1V- Bilateral lower lobe airspace opacities could reflect edema or infection.  Discussed the use of AI scribe software for clinical note transcription with the patient, who gave verbal consent to proceed.  History of Present Illness   The patient, with a history of atrial fibrillation and vertigo, presents with a persistent cough and sneezing since Thanksgiving. She denies fever and reports occasional production of phlegm, which has been clear for the past couple of weeks. She had been on Omnicef , an antibiotic, which did not seem to help. She also reports swelling in her feet (x 6 months), which improved with daily Lasix . She denies any change in her chest discomfort with Lasix . She has not been using her BiPAP machine due to the cough and sneezing, and also due to a fall in December which resulted in a facial injury. She reports extreme fatigue, which she attributes to not using the BiPAP machine. She also reports increased swelling in her feet over the past six months.  She has been working with cardiology and f/u ECHO is pending. BIPAP machine had begun cutting on and off during the night- needs service.   Assessment and Plan:- I doubt infection now. Cough is mild, but will see what Trelegy sample does for it. Will service BIPAP, then consider on f/u whether to change to [facility].    Chronic Cough Persistent cough since Thanksgiving,  initially with some infected-looking sputum, now clear. No fever. Recent antibiotic (Omnicef ) did not improve symptoms. No signs of infection on recent blood work. Increased lasix  did not much change either leg edema or cough. -Order two-view chest x-ray to evaluate for possible pneumonia or pulmonary edema. -Check CBC to evaluate for any changes in white blood cell count. -Trial of Trelegy inhaler to assess for any improvement  in cough.  Lower Extremity Edema Increased swelling in feet over the past six months, improved with daily Lasix . High BNP a month ago. -Notify cardiologist of increased edema. -Continue daily Lasix  as prescribed by cardiologist. Gracia BNP  Sleep Apnea Patient has not been using BiPAP due to cough and recent facial injury. Reports good sleep when using the device. -Service BiPAP machine. -Encourage patient to resume use of BiPAP as soon as possible.  Atrial Fibrillation Chronic condition, patient on Eliquis . -Continue Eliquis  as prescribed.  Facial Injury Recent fall resulting in facial injury, possibly due to vertigo. -No specific plan discussed, continue to monitor.  General Health Maintenance -Continue with planned echocardiogram. -Check basic chemistry as planned by Dr. Okey.        12/26/23- 65 yoF former smoker with hx OSA on BIPAP, Insomnia, Acute Bronchitis, complicated by pAFib/ Eliquis , mild to moderate AVR/TR,  HTN, CAD, CHF, GERD/stricture, hx L Br Ca,  -Ambien  10, Nasonex ,  BIPAP 17/13/ [facility]    AirCurve10 VAuto Download compliance-     80%, AHI 20.5/hr                                        Body weight today-159 lbs Discussed the use of AI scribe software for clinical note transcription with the patient, who gave verbal consent to proceed.  History of Present Illness   Mallory Watts is an 83 year old female who presents with sleep disturbances and CPAP management.  She experiences occasional difficulty sleeping, which she sometimes manages with Ambien , though she has not used it recently. Her sleep quality has improved with the use of a BiPAP machine. She does not find Trelegy helpful and has discontinued its use.     Assessment and Plan:    Obstructive sleep apnea with central apneas Current BiPAP settings may not optimally control central apneas. Auto BiPAP 10-20, PS4 settings considered to improve control. Further sleep study if adjustments  fail. - Adjust BiPAP settings to auto BiPAP to improve control of central apneas.  Insomnia Intermittent insomnia managed with Ambien . Trazodone  suggested as an alternative due to better suitability and tolerance. - Prescribe trazodone  for occasional use to aid sleep instead of Ambien . - Send trazodone  prescription to [facility] on [address] at [facility]         CXR 07/05/23 IMPRESSION: 1. Small bilateral pleural effusions.  ROS-see HPI   + = positive Constitutional:    weight loss, night sweats, fevers, chills, +fatigue, lassitude. HEENT:    headaches, difficulty swallowing, tooth/dental problems, sore throat,       sneezing, itching, ear ache, nasal congestion, post nasal drip, snoring CV:    chest pain, orthopnea, PND, swelling in lower extremities, anasarca,                                   dizziness, palpitations Resp:   shortness of breath with exertion or at rest.  productive cough,   non-productive cough, coughing up of blood.              change in color of mucus.  wheezing.   Skin:    rash or lesions. GI:  No-   heartburn, indigestion, abdominal pain, nausea, vomiting, diarrhea,                 change in bowel habits, loss of appetite GU: dysuria, change in color of urine, no urgency or frequency.   flank pain. MS:   joint pain, stiffness, decreased range of motion, back pain. Neuro-     nothing unusual Psych:  change in mood or affect.  depression or anxiety.   memory loss.  OBJ- Physical Exam General- Alert, Oriented, Affect-appropriate, Distress- none acute Skin- rash-none, lesions- none, excoriation- none Lymphadenopathy- none Head- +fading hematoma L brow ridge            Eyes- Gross vision intact, PERRLA, conjunctivae and secretions clear            Ears- Hearing, canals-normal            Nose- Clear, no-Septal dev, mucus, polyps, erosion, perforation             Throat- Mallampati II , mucosa clear , drainage- none, tonsils- atrophic Neck- flexible ,  trachea midline, no stridor , thyroid  nl, carotid no bruit Chest - symmetrical excursion , unlabored           Heart/CV- RRR , no murmur , no gallop  , no rub, nl s1 s2                           - JVD- none , edema+3 tight, stasis changes- none, varices- none           Lung- +diminished, wheeze- none, cough+ dry with deep breath , dullness-none, rub- none           Chest wall-  Abd-  Br/ Gen/ Rectal- Not done, not indicated Extrem- cyanosis- none, clubbing, none, atrophy- none, strength- nl Neuro- grossly intact to observation

## 2023-12-26 ENCOUNTER — Encounter: Payer: Self-pay | Admitting: Internal Medicine

## 2023-12-26 ENCOUNTER — Ambulatory Visit: Admitting: Internal Medicine

## 2023-12-26 VITALS — BP 134/74 | HR 80 | Temp 97.7°F | Ht 62.0 in | Wt 159.8 lb

## 2023-12-26 DIAGNOSIS — G4700 Insomnia, unspecified: Secondary | ICD-10-CM | POA: Diagnosis not present

## 2023-12-26 DIAGNOSIS — Z87891 Personal history of nicotine dependence: Secondary | ICD-10-CM | POA: Diagnosis not present

## 2023-12-26 DIAGNOSIS — G4733 Obstructive sleep apnea (adult) (pediatric): Secondary | ICD-10-CM

## 2023-12-26 MED ORDER — TRAZODONE HCL 50 MG PO TABS: ORAL_TABLET | ORAL | 5 refills | Status: AC

## 2023-12-26 NOTE — Patient Instructions (Addendum)
 We are going to replace ambien  as a sleep medicine, with trazodone - see if it helps you sleep when needed.  Order- DME [facility]- please change to autoBIPAP 10-20, PS4, continue mask of choice, humidifier, supplies, AirView/ card  As discussed, in the future, it may be a good idea to do a BIPAP ST titration sleep study to try a different kind of machine.

## 2023-12-29 ENCOUNTER — Encounter: Payer: Self-pay | Admitting: Internal Medicine

## 2023-12-29 ENCOUNTER — Other Ambulatory Visit: Payer: Self-pay | Admitting: Internal Medicine

## 2024-01-01 ENCOUNTER — Other Ambulatory Visit (HOSPITAL_COMMUNITY): Payer: Self-pay

## 2024-01-01 ENCOUNTER — Other Ambulatory Visit: Payer: Self-pay

## 2024-01-08 ENCOUNTER — Other Ambulatory Visit: Payer: Self-pay

## 2024-02-11 NOTE — Progress Notes (Deleted)
 Pt is a no show

## 2024-02-19 ENCOUNTER — Ambulatory Visit: Attending: Internal Medicine | Admitting: Internal Medicine

## 2024-02-19 ENCOUNTER — Encounter: Payer: Self-pay | Admitting: Internal Medicine

## 2024-03-21 NOTE — Progress Notes (Unsigned)
 Cardiology Office Note:  .   Date:  03/22/2024  ID:  Mallory Watts, DOB [birth year], MRN [id number] PCP: Onita Rush, MD  [facility] Providers Cardiologist:  Vina Gull, MD {  History of Present Illness: .   Mallory Watts is a 83 y.o. female with history of CAD status post CABG 2004, PAF OSA, aortic dilatation, diabetes     CAD 2004 CABG 2023 low risk Myoview  10/2022 NM PET/CT normal  Atrial fibrillation 07/2021 heart monitor with 100% atrial fibrillation burden.  Eventually seen by Dr. Gull and placed on Multaq .  Next visit was in sinus rhythm. 2023-08/2023 heart monitors with no atrial fibrillation.  11.4% PVC burden on most recent monitor  Mitral regurgitation 05/2023 echocardiogram with preserved biventricular function.  Moderate MR.  Social history  Former smoker Drinks once a month No drug use Widowed.  Mother and father with history of MI.    Patient with remote history of CABG 2004 with reassuring stress test in 10/2022.  Has had atrial fibrillation in the past but placed on Multaq  and the next day she was in sinus rhythm.  Subsequent heart monitors after that have not demonstrated atrial fibrillation.  She was last seen 11/2023 and reported swelling in her legs, eating out a lot.  No chest pain.  Reported lack of energy.  proBNP 4100+.  Multaq  stopped.  Today patient presents for routine follow-up.  She reports persistent lower extremity edema although has not been pitting.  Shortness of breath overall seems to have improved over the past few months but minimally.  Still does not feel quite at baseline and would like to enroll back into excise classes that she had done before but does not feel that she is ready yet.  She reports taking Lasix  80 mg in the morning and 40 at night.  Compliant with all of her medications.  Had not reported any orthopnea or chest pain, palpitations or rapid heart rates.  Weight she reports has been stable over the past couple months.   She lives in a 3 story house and ambulating around with minimal difficulty.  Mainly she is just doing household chores.  Reports she is always doing things and has not been using her compression stockings.  ROS: Denies: Chest pain, orthopnea, palpitations, decreased exercise intolerance, fatigue, lightheadedness.   Studies Reviewed: .         Risk Assessment/Calculations:    CHA2DS2-VASc Score = 5   This indicates a 7.2% annual risk of stroke. The patient's score is based upon: CHF History: 0 HTN History: 1 Diabetes History: 0 Stroke History: 0 Vascular Disease History: 1 Age Score: 2 Gender Score: 1         Physical Exam:   VS:  BP 138/74   Pulse 61   Ht 5' 2 (1.575 m)   Wt 162 lb 3.2 oz (73.6 kg)   LMP 04/11/1994   SpO2 97%   BMI 29.67 kg/m    Wt Readings from Last 3 Encounters:  03/22/24 162 lb 3.2 oz (73.6 kg)  12/26/23 159 lb 12.8 oz (72.5 kg)  11/28/23 175 lb 12.8 oz (79.7 kg)    GEN: Well nourished, well developed in no acute distress NECK: + JVD; No carotid bruits CARDIAC: RRR, no murmurs, rubs, gallops RESPIRATORY:  Clear to auscultation without rales, wheezing or rhonchi  ABDOMEN: Soft, non-tender, non-distended EXTREMITIES:  mild non pitting edema; No deformity. CVI  ASSESSMENT AND PLAN: .    Chronic HFpEF Mitral regurgitation -  05/2023 EF 50 to 55% with low normal RV.  Moderate MR.  Moderate TR.  Moderate AR. She has had persistent complaints of lower extremity edema and shortness of breath with minor improvement since her last visit in August.  However without any significant symptoms per her report.  Lower extremity edema I think mainly appears to be CVI.  However, suspect she does have an element of HFpEF as well given that she looks slightly volume up today with JVD appreciated on exam but no pulmonary congestion and nonpitting edema. Repeating echocardiogram, concerned regurg has worsened.  Prior BNP in August was 4000+. Avoiding SGLT2 inhibitor given  issues with yeast infections and UTIs.  I would like to start her on spironolactone 12.5 mg but will obtain BMP first.  She has had persistent issues with hypokalemia in the past and on decently strong diuretic regiment.   Continue lasix  80mg  and 40mg . She's also on potasium 40mg  daily. Likely can stop K+ if put on spiro.  Suspect she may be a candidate for mitral clip. Monitor weight closely.  Also encouraged compression stockings  CAD Hyperlipidemia -2004 CABG -10/2022 NM PET/CT normal Seems to have stable disease without any anginal complaints.  Shortness of breath likely related to hypervolemia/MR as above. No aspirin  with DOAC.  Continue with atorvastatin  80mg , zetia  10,Toprol -XL 25 mg twice daily. At next visit need to update lipid panel.  PAF -Heart monitors 2023 through 2025 did not show any evidence of atrial fibrillation. Suspect that she is in sinus rhythm today based on exam. Multaq  previously stopped due to hypervolemia. Continue with Eliquis .  Currently she is on 2.5 mg twice daily but currently right now does not meet criteria for reduced dosing.  Will check BMP today but suspect that she should be at 5 mg twice daily instead. Getting UA, she reports chronic dark urine, but no frank blood or other signs of UTI.  Obtain CBC.  PVCs 11.4% based off of monitor 08/2023.  Continue Toprol -XL as above.   Aortic dilatation 39 mm.  Getting echocardiogram  Type 2 diabetes A1c is 6.8% 09/2023.    OSA on CPAP Follows with pulmonology.   Dispo: Follow up after echo. With me or Dr. Okey.   Signed, Thom LITTIE Sluder, PA-C

## 2024-03-22 ENCOUNTER — Encounter: Payer: Self-pay | Admitting: Cardiology

## 2024-03-22 ENCOUNTER — Ambulatory Visit: Attending: Cardiology | Admitting: Cardiology

## 2024-03-22 VITALS — BP 138/74 | HR 61 | Ht 62.0 in | Wt 162.2 lb

## 2024-03-22 DIAGNOSIS — I10 Essential (primary) hypertension: Secondary | ICD-10-CM

## 2024-03-22 DIAGNOSIS — R319 Hematuria, unspecified: Secondary | ICD-10-CM

## 2024-03-22 DIAGNOSIS — Z79899 Other long term (current) drug therapy: Secondary | ICD-10-CM

## 2024-03-22 DIAGNOSIS — I340 Nonrheumatic mitral (valve) insufficiency: Secondary | ICD-10-CM

## 2024-03-22 NOTE — Patient Instructions (Addendum)
 Thank you for choosing [facility]!     Medication Instructions:  No medication changes were made during today's visit.  *If you need a refill on your cardiac medications before your next appointment, please call your pharmacy*   Lab Work: BMET, CBC If you have labs (blood work) drawn today and your tests are completely normal, you will receive your results only by: MyChart Message (if you have MyChart) OR A paper copy in the mail If you have any lab test that is abnormal or we need to change your treatment, we will call you to review the results.   Testing/Procedures: Your physician has requested that you have an echocardiogram. Echocardiography is a painless test that uses sound waves to create images of your heart. It provides your doctor with information about the size and shape of your heart and how well your hearts chambers and valves are working. This procedure takes approximately one hour. There are no restrictions for this procedure. Please do NOT wear cologne, perfume, aftershave, or lotions (deodorant is allowed). Please arrive 15 minutes prior to your appointment time.  Please note: We ask at that you not bring children with you during ultrasound (echo/ vascular) testing. Due to room size and safety concerns, children are not allowed in the ultrasound rooms during exams. Our front office staff cannot provide observation of children in our lobby area while testing is being conducted. An adult accompanying a patient to their appointment will only be allowed in the ultrasound room at the discretion of the ultrasound technician under special circumstances. We apologize for any inconvenience.   Your next appointment:   2-3 month(s) PATIENT IS TO BE SEEN AFTER ECHO ONLY   Provider:   Thom Sluder, PA-C           Follow-Up: At [facility], you and your health needs are our priority.  As part of our continuing mission to provide you with exceptional heart care,  we have created designated Provider Care Teams.  These Care Teams include your primary Cardiologist (physician) and Advanced Practice Providers (APPs -  Physician Assistants and Nurse Practitioners) who all work together to provide you with the care you need, when you need it. We recommend signing up for the patient portal called MyChart.  Sign up information is provided on this After Visit Summary.  MyChart is used to connect with patients for Virtual Visits (Telemedicine).  Patients are able to view lab/test results, encounter notes, upcoming appointments, etc.  Non-urgent messages can be sent to your provider as well.   To learn more about what you can do with MyChart, go to [email or web address].

## 2024-03-22 NOTE — Addendum Note (Signed)
 Addended by: MALVINA CHARLENA CROME on: 03/22/2024 02:22 PM   Modules accepted: Orders

## 2024-03-23 LAB — CBC
Hematocrit: 35.8 % (ref 34.0–46.6)
Hemoglobin: 10.8 g/dL — ABNORMAL LOW (ref 11.1–15.9)
MCH: 24.9 pg — ABNORMAL LOW (ref 26.6–33.0)
MCHC: 30.2 g/dL — ABNORMAL LOW (ref 31.5–35.7)
MCV: 83 fL (ref 79–97)
Platelets: 279 x10E3/uL (ref 150–450)
RBC: 4.33 x10E6/uL (ref 3.77–5.28)
RDW: 17 % — ABNORMAL HIGH (ref 11.7–15.4)
WBC: 7.4 x10E3/uL (ref 3.4–10.8)

## 2024-03-23 LAB — URINALYSIS, ROUTINE W REFLEX MICROSCOPIC
Specific Gravity, UA: 1.023 (ref 1.005–1.030)
Urobilinogen, Ur: 0.2 mg/dL (ref 0.2–1.0)
pH, UA: 5 (ref 5.0–7.5)

## 2024-03-23 LAB — BASIC METABOLIC PANEL WITH GFR
BUN/Creatinine Ratio: 21 (ref 12–28)
BUN: 25 mg/dL (ref 8–27)
CO2: 23 mmol/L (ref 20–29)
Calcium: 9.3 mg/dL (ref 8.7–10.3)
Chloride: 106 mmol/L (ref 96–106)
Creatinine, Ser: 1.2 mg/dL — ABNORMAL HIGH (ref 0.57–1.00)
Glucose: 113 mg/dL — ABNORMAL HIGH (ref 70–99)
Potassium: 3.8 mmol/L (ref 3.5–5.2)
Sodium: 145 mmol/L — ABNORMAL HIGH (ref 134–144)
eGFR: 46 mL/min/1.73 — ABNORMAL LOW (ref >59)

## 2024-03-25 ENCOUNTER — Ambulatory Visit: Payer: Self-pay | Admitting: Cardiology

## 2024-03-25 DIAGNOSIS — Z79899 Other long term (current) drug therapy: Secondary | ICD-10-CM

## 2024-03-26 MED ORDER — SPIRONOLACTONE 25 MG PO TABS: 12.5000 mg | ORAL_TABLET | Freq: Every day | ORAL | 3 refills | Status: AC

## 2024-03-26 NOTE — Addendum Note (Signed)
 Addended by: Ori Kreiter on: 03/26/2024 03:02 PM   Modules accepted: Orders

## 2024-03-27 NOTE — Telephone Encounter (Signed)
 Patient notified, prescription sent and orders has been placed.

## 2024-03-29 ENCOUNTER — Telehealth: Payer: Self-pay | Admitting: Cardiology

## 2024-03-29 NOTE — Telephone Encounter (Signed)
 Pt c/o medication issue:  1. Name of Medication: spironolactone  (ALDACTONE ) 25 MG tablet   2. How are you currently taking this medication (dosage and times per day)?  Take 0.5 tablets (12.5 mg total) by mouth daily.      3. Are you having a reaction (difficulty breathing--STAT)? No  4. What is your medication issue? Pt is requesting a callback regarding her being seen in office recently but she stated on her discharge summary it showed no changes to medications. Well now pt in confused because this medication was called in for her after the appt by the PA she seen. Please advise

## 2024-03-29 NOTE — Telephone Encounter (Signed)
 Spoke with patient and went over her new medication Spironolactone . Also instructed her to stop taking potassium supplement and in a week go to any [facility] and have her labs drawn. Pt aware that her script was sent to [facility] on [address]. Verbalizes understanding.

## 2024-05-03 ENCOUNTER — Encounter (HOSPITAL_COMMUNITY): Payer: Self-pay | Admitting: Cardiology

## 2024-05-03 ENCOUNTER — Ambulatory Visit (HOSPITAL_COMMUNITY)

## 2024-05-10 ENCOUNTER — Ambulatory Visit: Admitting: Cardiology

## 2024-05-15 ENCOUNTER — Emergency Department (HOSPITAL_BASED_OUTPATIENT_CLINIC_OR_DEPARTMENT_OTHER): Admitting: Radiology

## 2024-05-15 ENCOUNTER — Emergency Department (HOSPITAL_BASED_OUTPATIENT_CLINIC_OR_DEPARTMENT_OTHER)
Admission: EM | Admit: 2024-05-15 | Discharge: 2024-05-15 | Disposition: A | Discharge status: Home / Self Care | Source: Home / Self Care | Attending: Emergency Medicine | Admitting: Emergency Medicine

## 2024-05-15 ENCOUNTER — Other Ambulatory Visit: Payer: Self-pay

## 2024-05-15 DIAGNOSIS — R52 Pain, unspecified: Secondary | ICD-10-CM

## 2024-05-15 LAB — BASIC METABOLIC PANEL WITH GFR
Anion gap: 11 (ref 5–15)
BUN: 27 mg/dL — ABNORMAL HIGH (ref 8–23)
CO2: 25 mmol/L (ref 22–32)
Calcium: 9.4 mg/dL (ref 8.9–10.3)
Chloride: 104 mmol/L (ref 98–111)
Creatinine, Ser: 1.12 mg/dL — ABNORMAL HIGH (ref 0.44–1.00)
GFR, Estimated: 49 mL/min — ABNORMAL LOW
Glucose, Bld: 178 mg/dL — ABNORMAL HIGH (ref 70–99)
Potassium: 4.1 mmol/L (ref 3.5–5.1)
Sodium: 140 mmol/L (ref 135–145)

## 2024-05-15 LAB — CBC WITH DIFFERENTIAL/PLATELET
Abs Immature Granulocytes: 0.02 10*3/uL (ref 0.00–0.07)
Basophils Absolute: 0 10*3/uL (ref 0.0–0.1)
Basophils Relative: 1 %
Eosinophils Absolute: 0.1 10*3/uL (ref 0.0–0.5)
Eosinophils Relative: 2 %
HCT: 34 % — ABNORMAL LOW (ref 36.0–46.0)
Hemoglobin: 10.1 g/dL — ABNORMAL LOW (ref 12.0–15.0)
Immature Granulocytes: 0 %
Lymphocytes Relative: 23 %
Lymphs Abs: 1.4 10*3/uL (ref 0.7–4.0)
MCH: 22.6 pg — ABNORMAL LOW (ref 26.0–34.0)
MCHC: 29.7 g/dL — ABNORMAL LOW (ref 30.0–36.0)
MCV: 76.1 fL — ABNORMAL LOW (ref 80.0–100.0)
Monocytes Absolute: 0.8 10*3/uL (ref 0.1–1.0)
Monocytes Relative: 12 %
Neutro Abs: 3.8 10*3/uL (ref 1.7–7.7)
Neutrophils Relative %: 62 %
Platelets: 246 10*3/uL (ref 150–400)
RBC: 4.47 MIL/uL (ref 3.87–5.11)
RDW: 18.9 % — ABNORMAL HIGH (ref 11.5–15.5)
WBC: 6.2 10*3/uL (ref 4.0–10.5)
nRBC: 0 % (ref 0.0–0.2)

## 2024-05-15 LAB — TROPONIN T, HIGH SENSITIVITY: Troponin T High Sensitivity: 20 ng/L — ABNORMAL HIGH (ref 0–19)

## 2024-05-15 LAB — PRO BRAIN NATRIURETIC PEPTIDE: Pro Brain Natriuretic Peptide: 1628 pg/mL — ABNORMAL HIGH

## 2024-05-15 NOTE — ED Notes (Signed)
 DC paperwork given and verbally understood.

## 2024-05-15 NOTE — ED Provider Notes (Incomplete)
 Elderly female with a history of atrial fibrillation who is presenting today with complaints of left side pain.  She describes it almost like a charley horse or a cramp that is brief in nature and it comes and goes.  Has been getting worse over the last few months.  Now getting approximately 5-6 episodes per day.  Resolves spontaneously without intervention however not improvement with muscle relaxer.  Initially she felt like magnesium  helped make the symptoms better but not anymore.  She does have chronic swelling in her legs and atrial fibrillation but does not feel that those are any different.  She does have an echo ordered for later this week but denies any worsening shortness of breath.  She has had no change in her appetite, weight loss but has had a mild cough which is nonproductive.  EKG with atrial fibrillation today without other acute findings.  BNP is elevated at 1628 which is still within normal limits for her age, electrolytes without acute findings, CBC without acute findings.

## 2024-05-15 NOTE — Discharge Instructions (Signed)
 Imaging and lab work done in the emergency department today was reassuring.  As discussed it is advised to follow-up with your cardiology as scheduled for further evaluation of your heart.  If you do have any concerning new or worsening symptoms and would like to pursue further imaging at that time please return to emergency department for further evaluation.  It is also recommended to follow-up with primary care for reassessment of continued pain.

## 2024-05-15 NOTE — ED Triage Notes (Signed)
 C/o lower back spasms that pull to my stomach x 3 weeks. Denies any injury.   Denies urinary symptoms.

## 2024-05-15 NOTE — ED Provider Notes (Signed)
 " [facility] EMERGENCY DEPARTMENT AT [facility] Provider Note   CSN: [id number] Arrival date & time: 05/15/24  1520     Patient presents with: Back Pain   Mallory Watts is a 84 y.o. female.  84 year old female presents emergency department with complaints of bouts of muscular spasms since Christmas.  She also endorses some intermittent dizziness.  She has a significant history of A-fib on Eliquis , CHF on furosemide , hypertension, vertigo.  She reports she will have random bouts of muscle spasms that began in her left shoulder blade and will last for a very short period of time and then subside.  She reports they have since migrated towards her abdomen.  She describes these episodes as random severe sharp pains that subside quickly.  She has been seen by PCP for same, placed on muscle relaxers at that time denies any relief from muscle relaxers.  Patient reports some mild relief with Tylenol .  She denies any known exacerbating factors and reports dizziness is not associated with the episodes.     Prior to Admission medications  Medication Sig Start Date End Date Taking? Authorizing Provider  acetaminophen  (TYLENOL ) 650 MG CR tablet Take 1,300 mg by mouth 2 (two) times daily as needed for pain.    [provider]  alendronate  (FOSAMAX ) 70 MG tablet Take 1 tablet (70 mg total) by mouth once a week. Patient taking differently: Take 70 mg by mouth every 14 (fourteen) days. 10/11/22     apixaban  (ELIQUIS ) 2.5 MG TABS tablet Take 1 tablet (2.5 mg total) by mouth in the morning and at bedtime. 07/07/23   Drusilla Sabas RAMAN, MD  atorvastatin  (LIPITOR) 80 MG tablet Take 1 tablet (80 mg total) by mouth daily. 05/15/23     Cholecalciferol (VITAMIN D-3 PO) Take 1 capsule by mouth at bedtime.    [provider]  estradiol  (ESTRACE  VAGINAL) 0.1 MG/GM vaginal cream Place 1 g vaginally 3 (three) times a week. Patient not taking: Reported on 03/22/2024 02/17/23   Chrzanowski, Jami B, NP   ezetimibe  (ZETIA ) 10 MG tablet Take 1 tablet (10 mg total) by mouth daily. 05/17/23     fexofenadine (ALLEGRA) 180 MG tablet Take 180 mg by mouth daily as needed for allergies or rhinitis.    [provider]  furosemide  (LASIX ) 40 MG tablet Take 1.5 tablets by mouth in the morning, then take 1 tablet by mouth in the evening. Patient taking differently: Take 80 mg by mouth daily. Taking 80mg  in the morning 40mg  at night 10/02/23   Okey Vina GAILS, MD  Magnesium  250 MG TABS Take 250 mg by mouth daily as needed (for leg cramps or constipation).    [provider]  meclizine (ANTIVERT) 25 MG tablet Take 25 mg by mouth 3 (three) times daily as needed for dizziness.    [provider]  metoprolol  succinate (TOPROL -XL) 25 MG 24 hr tablet Take 1 tablet (25 mg total) by mouth 2 (two) times daily. 11/13/23   Okey Vina GAILS, MD  mometasone  (NASONEX ) 50 MCG/ACT nasal spray PLACE 2 SPRAYS INTO THE NOSE DAILY. 12/27/21   Cobb, Comer GAILS, NP  neomycin -polymyxin b-dexamethasone  (MAXITROL ) 3.5-10000-0.1 OINT Place 1 Application into both eyes 2 (two) times daily as needed (AS DIRECTED).    [provider]  nitroGLYCERIN  (NITROSTAT ) 0.4 MG SL tablet PLACE 1 TABLET UNDER THE TONGUE EVERY 5 MINUTES AS NEEDED FOR CHEST PAIN. 12/29/21   Okey Vina GAILS, MD  omeprazole  (PRILOSEC) 40 MG capsule Take 1  capsule (40 mg total) by mouth daily. 09/10/23     solifenacin  (VESICARE ) 5 MG tablet One daily as needed 07/21/14   Rodgers Barnie RAMAN, CNM  spironolactone  (ALDACTONE ) 25 MG tablet Take 0.5 tablets (12.5 mg total) by mouth daily. 03/26/24   Darryle Thom CROME, PA-C  traZODone  (DESYREL ) 50 MG tablet 1 tab at bedtime as needed for sleep 12/26/23   Neysa Reggy BIRCH, MD  TUMS ULTRA 1000 1000 MG chewable tablet Chew 500 mg by mouth 2 (two) times daily.    [provider]  valACYclovir  (VALTREX ) 500 MG tablet Take 1 tablet (500 mg total) by mouth daily. Take one tablet by mouth twice a day for 3 days as  needed for an outbreak. 01/24/18   Cathlyn JAYSON Nikki Bobie FORBES, MD    Allergies: Atenolol, Gadolinium, and Sulfonamide derivatives    Review of Systems  Musculoskeletal:  Positive for back pain.  All other systems reviewed and are negative.   Updated Vital Signs Pulse 88   Temp 97.9 F (36.6 C) (Oral)   Resp 18   LMP 04/11/1994   SpO2 99%   Physical Exam  (all labs ordered are listed, but only abnormal results are displayed) Labs Reviewed - No data to display  EKG: None  Radiology: No results found.   Procedures   Medications Ordered in the ED - No data to display  83 y.o. female presents to the ED with complaints of ***, this involves an extensive number of treatment options, and is a complaint that carries with it a high risk of complications and morbidity.  The differential diagnosis includes *** (Ddx)  On arrival pt is nontoxic, vitals ***. Exam significant for ***  Additional history obtained from ***. Previous records obtained and reviewed ***  I ordered medication *** for ***  Lab Tests:  I Ordered, reviewed, and interpreted labs, which included:   Imaging Studies ordered:  I ordered imaging studies which included ***, I independently visualized and interpreted imaging which showed ***  ED Course:     Patient was offered imaging of chest abdomen pelvis for further evaluation.  Patient declined at this time and advised that she has a follow-up echo later in the month and will start with that before getting further imaging.  Portions of this note were generated with Scientist, clinical (histocompatibility and immunogenetics). Dictation errors may occur despite best attempts at proofreading.   Final diagnoses:  None    ED Discharge Orders     None        "

## 2024-06-07 ENCOUNTER — Ambulatory Visit (HOSPITAL_COMMUNITY)

## 2024-06-18 ENCOUNTER — Ambulatory Visit: Admitting: Cardiology
